# Patient Record
Sex: Female | Born: 1935 | Race: White | Hispanic: No | Smoking: Never smoker
Health system: Southern US, Community
[De-identification: ages and names within clinical notes are randomized; demographics above are authoritative.]

## PROBLEM LIST (undated history)

## (undated) DIAGNOSIS — R600 Localized edema: Secondary | ICD-10-CM

## (undated) DIAGNOSIS — K219 Gastro-esophageal reflux disease without esophagitis: Secondary | ICD-10-CM

## (undated) DIAGNOSIS — R609 Edema, unspecified: Secondary | ICD-10-CM

## (undated) DIAGNOSIS — C801 Malignant (primary) neoplasm, unspecified: Secondary | ICD-10-CM

## (undated) DIAGNOSIS — N390 Urinary tract infection, site not specified: Secondary | ICD-10-CM

## (undated) DIAGNOSIS — M1990 Unspecified osteoarthritis, unspecified site: Secondary | ICD-10-CM

## (undated) DIAGNOSIS — C569 Malignant neoplasm of unspecified ovary: Secondary | ICD-10-CM

## (undated) DIAGNOSIS — I509 Heart failure, unspecified: Secondary | ICD-10-CM

## (undated) DIAGNOSIS — Z66 Do not resuscitate: Secondary | ICD-10-CM

## (undated) HISTORY — DX: Do not resuscitate: Z66

## (undated) HISTORY — PX: CARDIAC CATHETERIZATION: SHX172

## (undated) HISTORY — PX: CHOLECYSTECTOMY: SHX55

## (undated) HISTORY — DX: Urinary tract infection, site not specified: N39.0

## (undated) HISTORY — PX: TOTAL KNEE ARTHROPLASTY: SHX125

## (undated) HISTORY — PX: APPENDECTOMY: SHX54

## (undated) HISTORY — PX: TOTAL ABDOMINAL HYSTERECTOMY: SHX209

---

## 2001-10-23 ENCOUNTER — Ambulatory Visit (HOSPITAL_COMMUNITY): Admission: RE | Admit: 2001-10-23 | Discharge: 2001-10-23 | Payer: Self-pay | Admitting: Pulmonary Disease

## 2001-12-17 ENCOUNTER — Ambulatory Visit (HOSPITAL_COMMUNITY): Admission: RE | Admit: 2001-12-17 | Discharge: 2001-12-17 | Payer: Self-pay | Admitting: Pulmonary Disease

## 2002-09-09 ENCOUNTER — Ambulatory Visit (HOSPITAL_COMMUNITY): Admission: RE | Admit: 2002-09-09 | Discharge: 2002-09-09 | Payer: Self-pay | Admitting: Pulmonary Disease

## 2002-11-29 ENCOUNTER — Encounter (INDEPENDENT_AMBULATORY_CARE_PROVIDER_SITE_OTHER): Payer: Self-pay | Admitting: Internal Medicine

## 2002-11-29 ENCOUNTER — Ambulatory Visit (HOSPITAL_COMMUNITY): Admission: RE | Admit: 2002-11-29 | Discharge: 2002-11-29 | Payer: Self-pay | Admitting: Internal Medicine

## 2003-05-18 ENCOUNTER — Ambulatory Visit (HOSPITAL_COMMUNITY): Admission: RE | Admit: 2003-05-18 | Discharge: 2003-05-18 | Payer: Self-pay | Admitting: Internal Medicine

## 2003-08-22 ENCOUNTER — Ambulatory Visit (HOSPITAL_COMMUNITY): Admission: RE | Admit: 2003-08-22 | Discharge: 2003-08-22 | Payer: Self-pay | Admitting: Internal Medicine

## 2003-08-22 ENCOUNTER — Encounter (INDEPENDENT_AMBULATORY_CARE_PROVIDER_SITE_OTHER): Payer: Self-pay | Admitting: Internal Medicine

## 2004-04-03 ENCOUNTER — Ambulatory Visit (HOSPITAL_COMMUNITY): Admission: RE | Admit: 2004-04-03 | Discharge: 2004-04-03 | Payer: Self-pay | Admitting: Pulmonary Disease

## 2004-04-06 ENCOUNTER — Encounter: Admission: RE | Admit: 2004-04-06 | Discharge: 2004-04-06 | Payer: Self-pay | Admitting: Pulmonary Disease

## 2004-04-25 ENCOUNTER — Ambulatory Visit (HOSPITAL_COMMUNITY): Admission: RE | Admit: 2004-04-25 | Discharge: 2004-04-25 | Payer: Self-pay | Admitting: Pulmonary Disease

## 2004-10-12 ENCOUNTER — Encounter: Payer: Self-pay | Admitting: Orthopedic Surgery

## 2004-10-15 ENCOUNTER — Ambulatory Visit: Payer: Self-pay | Admitting: Orthopedic Surgery

## 2004-10-24 ENCOUNTER — Ambulatory Visit (HOSPITAL_COMMUNITY): Admission: RE | Admit: 2004-10-24 | Discharge: 2004-10-24 | Payer: Self-pay | Admitting: Orthopedic Surgery

## 2004-10-29 ENCOUNTER — Ambulatory Visit: Payer: Self-pay | Admitting: Orthopedic Surgery

## 2004-11-22 ENCOUNTER — Ambulatory Visit: Payer: Self-pay | Admitting: Orthopedic Surgery

## 2004-12-04 ENCOUNTER — Ambulatory Visit: Payer: Self-pay | Admitting: Orthopedic Surgery

## 2004-12-04 ENCOUNTER — Inpatient Hospital Stay (HOSPITAL_COMMUNITY): Admission: RE | Admit: 2004-12-04 | Discharge: 2004-12-07 | Payer: Self-pay | Admitting: Orthopedic Surgery

## 2004-12-07 ENCOUNTER — Inpatient Hospital Stay: Admission: AD | Admit: 2004-12-07 | Discharge: 2004-12-26 | Payer: Self-pay | Admitting: Pulmonary Disease

## 2005-01-10 ENCOUNTER — Ambulatory Visit: Payer: Self-pay | Admitting: Orthopedic Surgery

## 2005-03-20 ENCOUNTER — Ambulatory Visit: Payer: Self-pay | Admitting: Orthopedic Surgery

## 2005-06-17 ENCOUNTER — Encounter: Payer: Self-pay | Admitting: Orthopedic Surgery

## 2005-06-19 ENCOUNTER — Ambulatory Visit: Payer: Self-pay | Admitting: Orthopedic Surgery

## 2007-05-11 ENCOUNTER — Ambulatory Visit (HOSPITAL_COMMUNITY): Admission: RE | Admit: 2007-05-11 | Discharge: 2007-05-11 | Payer: Self-pay | Admitting: Pulmonary Disease

## 2007-09-07 ENCOUNTER — Ambulatory Visit (HOSPITAL_COMMUNITY): Admission: RE | Admit: 2007-09-07 | Discharge: 2007-09-07 | Payer: Self-pay | Admitting: Pulmonary Disease

## 2007-09-07 ENCOUNTER — Encounter: Payer: Self-pay | Admitting: Orthopedic Surgery

## 2007-11-19 ENCOUNTER — Ambulatory Visit: Payer: Self-pay | Admitting: Orthopedic Surgery

## 2007-11-19 DIAGNOSIS — M1710 Unilateral primary osteoarthritis, unspecified knee: Secondary | ICD-10-CM | POA: Insufficient documentation

## 2007-11-19 DIAGNOSIS — M76899 Other specified enthesopathies of unspecified lower limb, excluding foot: Secondary | ICD-10-CM | POA: Insufficient documentation

## 2007-11-19 DIAGNOSIS — M25569 Pain in unspecified knee: Secondary | ICD-10-CM

## 2008-01-11 ENCOUNTER — Ambulatory Visit (HOSPITAL_COMMUNITY): Admission: RE | Admit: 2008-01-11 | Discharge: 2008-01-11 | Payer: Self-pay | Admitting: Pulmonary Disease

## 2009-10-11 ENCOUNTER — Ambulatory Visit: Payer: Self-pay | Admitting: Internal Medicine

## 2009-10-11 DIAGNOSIS — K921 Melena: Secondary | ICD-10-CM | POA: Insufficient documentation

## 2009-10-11 DIAGNOSIS — K5909 Other constipation: Secondary | ICD-10-CM | POA: Insufficient documentation

## 2009-10-11 DIAGNOSIS — K219 Gastro-esophageal reflux disease without esophagitis: Secondary | ICD-10-CM

## 2009-10-11 DIAGNOSIS — R1310 Dysphagia, unspecified: Secondary | ICD-10-CM

## 2009-10-12 ENCOUNTER — Encounter: Payer: Self-pay | Admitting: Internal Medicine

## 2009-10-13 ENCOUNTER — Encounter: Payer: Self-pay | Admitting: Internal Medicine

## 2009-10-20 ENCOUNTER — Ambulatory Visit (HOSPITAL_COMMUNITY): Admission: RE | Admit: 2009-10-20 | Discharge: 2009-10-20 | Payer: Self-pay | Admitting: Internal Medicine

## 2009-10-20 ENCOUNTER — Ambulatory Visit: Payer: Self-pay | Admitting: Internal Medicine

## 2009-10-31 ENCOUNTER — Encounter: Payer: Self-pay | Admitting: Internal Medicine

## 2010-01-22 ENCOUNTER — Ambulatory Visit: Admission: AD | Admit: 2010-01-22 | Discharge: 2010-01-22 | Payer: Self-pay | Admitting: Pulmonary Disease

## 2010-05-07 ENCOUNTER — Ambulatory Visit (HOSPITAL_COMMUNITY): Admission: RE | Admit: 2010-05-07 | Discharge: 2010-05-07 | Payer: Self-pay | Admitting: Pulmonary Disease

## 2011-04-19 NOTE — Op Note (Signed)
NAMEMARGUARITE, Duke NO.:  [id number]   MEDICAL RECORD NO.:  [id number]          PATIENT TYPE:  AMB   LOCATION:  DAY                           FACILITY:  [facility]   PHYSICIAN:  Vickki Hearing, M.D.DATE OF BIRTH:  20-May-1935   DATE OF PROCEDURE:  12/04/2004  DATE OF DISCHARGE:                                 OPERATIVE REPORT   PREOPERATIVE DIAGNOSIS:  Osteoarthritis of left knee.   POSTOPERATIVE DIAGNOSIS:  Osteoarthritis of left knee.   PROCEDURES:  Left total knee replacement.   IMPLANTS USED:  Stryker Scorpio posterior stabilized knee, 9 femur, 7 tibia,  10 flex polyethylene insert, #5 universal dome patella.   SURGEON:  Vickki Hearing, M.D.   ASSISTANTVictorino Dike __________   SPECIMENS:  Bone from the knee resections.   ESTIMATED BLOOD LOSS:  Minimal.   COMPLICATIONS:  None.   COUNTS:  Correct.   TOURNIQUET TIME:  One hour and 21 minutes.   INDICATIONS FOR PROCEDURE:  Pain.   ANESTHETIC:  Spinal.   OPERATIVE FINDINGS:  1.  Degenerative arthritis of the posteromedial surface of the tibia.  2.  Torn medial meniscus.  3.  Ganglion cyst of the anterior cruciate ligament.   DESCRIPTION OF PROCEDURE:  The patient was identified in the preoperative  holding area.  The surgical site was signed by the patient and the physician  as the left knee.  Preoperative antibiotic was given, Ancef.  That was well  tolerated despite penicillin allergy.  She was taken to surgery.  A spinal  anesthetic was placed.  She was placed supine.  A tourniquet was placed on  the left knee.  The left knee was prepped and draped in sterile technique.  The Foley catheter had been inserted prior to prepping the knee.  A timeout  was taken as required and the surgical site was confirmed as required.  Antibiotics were in.  Within an hour of skin incision, the implants were in.   An incision was made after exsanguination of the limb and elevation of the  tourniquet over  the left knee centered over the patella.  The subcutaneous  tissue was divided.  A medial arthrotomy was performed.  The patella was  everted.  Soft tissue, including medial and lateral menisci, ACL and PCL,  were resected.  The tibial guide was set at neutral with a 0 degree slope  and set for 8 mm resection from the medial tibial side.  Resection was made  with an oscillating saw.   The femur was prepared with a 3/8 inch drill bit drilled into the femoral  canal.  The canal was decompressed with a fluted guide rod, suction and  irrigation.  The distal femoral cut was set for 10 mm.  A 10 mm bone  resection was performed the femur was sized to a size 9 with mild external  rotation and four distal femoral cuts were made.  The patella was prepared.  Measured 22 and was cut down to a 13.  A universal #8 patellar dome trial  was prepared using three  peg holes and the #5 lollipop.   A trial reduction was performed with trial components and blocks were used  to confirm flexion and extension gaps.   Lateral release was done due to mild subluxation of the patella.   The knee was irrigated and the tibial punch was made.  The tibia, femur and  patella were cemented in place and cement cured.  A 10 mm flex insert was  placed.  Final range of motion was 0-120 using a no touch technique.  The  patella tracked normally.  An interrupted Bralon suture was used to close  the extensor mechanism.  Marcaine 30 mL was injected into the joint.  Pain  pump catheter was placed in the joint.  The subcutaneous tissue was closed  with a Hemovac drain in the subcutaneous tissue.  We used 2-0 absorbable  suture for that.   Staples were used to close the skin and sterile dressings and CryoCuff as  well.   Ace bandage was used to wrap the limb.  The patient was taken to the  recovery room in stable condition.     [name]   SEH/MEDQ  D:  12/04/2004  T:  12/04/2004  Job:  [id number]

## 2011-04-19 NOTE — H&P (Signed)
Sarah, Duke NO.:  [id number]   MEDICAL RECORD NO.:  [id number]          PATIENT TYPE:  AMB   LOCATION:  DAY                           FACILITY:  [facility]   PHYSICIAN:  Vickki Hearing, M.D.DATE OF BIRTH:  [birth year]   DATE OF ADMISSION:  DATE OF DISCHARGE:  LH                                HISTORY & PHYSICAL   CHIEF COMPLAINT:  Left knee pain.   HISTORY:  This is a 76 year old female with left knee pain and swelling,  presented with catching, aching sensation in the left knee which was  diffuse, posterior, somewhat radiating into the calf with a feeling of  giving way. She had a MRI of the knee which showed thinning of the articular  cartilage on the patella on the patellofemoral side with a tear of the  posterior horn of the medial meniscus and partial tearing of her ACL with  possible medial collateral ligament sprain, but this mot likely was  degenerative. There was degenerative osteoarthritic changes throughout the  knee. We offered her a small arthroscopic surgery versus large total knee  replacement, and she has opted for the knee replacement to get it all done  at one time. Her x-rays show degenerative changes though not as severe as  the MRI would indicate.   REVIEW OF SYSTEMS:  The patient reports the following:  Weight gain, chest  pain in the past, constipation, reflux, joint pain, osteoporosis, sinusitis,  seasonal allergies, sinus problems. Otherwise, the other four systems are  normal.   ALLERGIES:  She is allergic to PENICILLIN; however, Ancef should be okay.   PAST MEDICAL HISTORY:  Past history of medical problems of mitral valve  prolapse, protruding disk in the lumbar spine. She has had a bladder  tacking, appendectomy, hysterectomy, hemorrhoidectomy __________.   CURRENT MEDICATIONS:  Vicodin.   FAMILY HISTORY:  Heart disease, arthritis.   FAMILY PHYSICIAN:  Dr. Juanetta Gosling.   SOCIAL HISTORY:  She is widowed. Does not smoke or  drink. Caffeine use, yes.  Grades completed, 12.   PHYSICAL EXAMINATION:  Weight 185, pulse 66, respiratory rate 16. Appearance  normal. She has normal pulses. There is no tenderness noted in the lower  extremities. There are multiple varicose veins. Temperature normal. Pulses  normal without edema. Lymph nodes, neck normal. Gait and station were  reasonably normal with a valgus hint to the knee.   Her left knee did have 0 to 125 degree range of motion. There was some pain  noted as well along the joint lines. The knee was stable. There was some  subtle signs of meniscal pathology. Her muscle strength and tone were  normal, and her hint of valgus was mild. She had no major crepitance. There  was no mass or effusion.   Her other extremities showed normal range of motion, strength, and  stability. She did have a tender nodule with Tommi Rumps Quervain's syndrome of the  right wrist for which she was injected on December 22.   She is wearing a splint for that.   IMPRESSION:  Osteoarthritis of the  knee. Recommend total knee replacement of  the left knee. The patient has come in for a preoperative evaluation on  December 22 at which time we discussed the risks and benefits of this  procedure; specific to this procedure include infection, excessive bleeding,  need for transfusion, anesthetic death, blood clot, pulmonary embolism and  death from that, infection with removal of prosthesis, and possible  reimplantation versus amputation versus fusion.   The patient agrees to go ahead and proceed with said procedure.     [name]   SEH/MEDQ  D:  11/22/2004  T:  11/22/2004  Job:  [id number]

## 2011-04-19 NOTE — Group Therapy Note (Signed)
[name], [name] NO.:  [id number]   MEDICAL RECORD NO.:  [id number]          PATIENT TYPE:  ORB   LOCATION:  S118                          FACILITY:  [facility]   PHYSICIAN:  [name], M.D.DATE OF BIRTH:  1935-04-12   DATE OF PROCEDURE:  DATE OF DISCHARGE:                                   PROGRESS NOTE   [name] seems to be doing well, and plans are being made for her to go  home, probably tomorrow.  She has had a left knee replacement.  In addition  to that she has chronic low back pain, has had significant problems with her  chronic sinusitis, but otherwise has been in fairly good health in general.  Socially, she does not smoke, she is a widow.  She stays with shut-ins, and  her family is concerned about that because of the possibility that she could  hurt herself.  Her exam now shows that her chest is very clear.  She is  awake and alert.  Her vitals are as recorded.  Her pupils are reactive.  Her  nose and throat are clear.  She does not have any sinus congestion.  The  knee scar looks as if it is healing.  She is undergoing physical therapy,  and doing very well with that.   ASSESSMENT:  I think that she should be fine to go home.  We need to follow  her prothrombin time carefully there.      ELH/MEDQ  D:  12/25/2004  T:  12/25/2004  Job:  [id number]

## 2011-04-19 NOTE — Op Note (Signed)
NAME:  [name], [name]                            ACCOUNT NO.:  [id number]   MEDICAL RECORD NO.:  [id number]                   PATIENT TYPE:  AMB   LOCATION:  DAY                                  FACILITY:  [facility]   PHYSICIAN:  Lionel December, M.D.                 DATE OF BIRTH:  July 08, 1935   DATE OF PROCEDURE:  05/18/2003  DATE OF DISCHARGE:                                 OPERATIVE REPORT   PROCEDURE:  Esophagogastroduodenoscopy with esophageal dilatation.   ENDOSCOPIST:  Lionel December, M.D.   INDICATIONS:  This patient is a 76 year old Caucasian female with chronic  GERD whose symptoms are fairly well controlled with antireflux measures,  Prilosec and low dose domperidone.  She has intermittent solid food  dysphagia. She could have a ring or a stricture.  She is undergoing EGD and  possible ED.  The procedure and risks were reviewed with the patient and  informed consent was obtained.   PREOPERATIVE MEDICATIONS:  Cetacaine spray for oropharyngeal topical  anesthesia, Demerol 50 mg IV and Versed 6mg  IV.   FINDINGS:  Procedure performed in endoscopy suite.  The patient's vital  signs and O2 saturation were monitored during the procedure and remained  stable.  The patient was placed in the left lateral recumbent position and  Olympus videoscope was passed via the oropharynx into the esophagus.   ESOPHAGUS:  Mucosa of the esophagus was normal except distal 2 cm where she  had some erosions and 2 small ulcers.  One ulcer was diamond shaped  extending into the GE junction.  The distal end of this ulcer was edematous.  No obvious ring or stricture was noted.   STOMACH:  It was empty and distended very well with insufflation.  The folds  of the proximal stomach were normal.  Examination of the mucosa revealed  linear erythema and a single antral erosion.  Pyloric channel was patent.  Angularis, fundus, and cardia were examined by retroflexing the scope and  were normal.   DUODENUM:   Examination of the bulb and postbulbar duodenum was normal.  Endoscope was withdrawn.   Esophagus was dilated by passing 54 French Maloney dilator through the  esophagus completely.  Examination of the esophagus post ED revealed a very  tiny mucosal disruption at the GE junction.  The endoscope was withdrawn.  The patient tolerated the procedure well.   FINAL DIAGNOSES:  1. Ulcerative/erosive reflux esophagitis. Ulcers and erosions noted in the     distal 2 cm of the esophagus.  2. Small sliding hiatal hernia. No obvious ring or stricture noted, but     esophagus dilated by passing 54 [place] Maloney dilator.  3. Erosive antral gastritis.   RECOMMENDATIONS:  1. Antireflux measures reenforced.  2. Will increase her Prilosec to 20 mg p.o. b.i.d.  3. She will continue domperidone until she runs out of the prescription; and  I am afraid she, at least for the time being, will not be able to get     this refilled.  4. She will return for OV in 3 months from now.                                               Lionel December, M.D.    NR/MEDQ  D:  05/18/2003  T:  05/18/2003  Job:  [id number]   cc:   Ramon Dredge L. Juanetta Gosling, M.D.  [address]  [place]  [place] [address]  Fax: [phone]

## 2011-04-19 NOTE — Discharge Summary (Signed)
NAMEADELFA, [name] NO.:  [id number]   MEDICAL RECORD NO.:  [id number]          PATIENT TYPE:  INP   LOCATION:  A332                          FACILITY:  [facility]   PHYSICIAN:  Vickki Hearing, M.D.DATE OF BIRTH:  December 14, 1934   DATE OF ADMISSION:  12/04/2004  DATE OF DISCHARGE:  01/06/2006LH                                 DISCHARGE SUMMARY   ADMISSION DIAGNOSIS:  Osteoarthritis, left  knee.   DISCHARGE DIAGNOSIS:  Osteoarthritis, left  knee.   ADMITTING AND DISCHARGE PHYSICIAN:  Vickki Hearing, M.D.   FAMILY PHYSICIAN AND CONSULTING PHYSICIAN:  Edward L. Juanetta Gosling, M.D.   HISTORY:  The patient is a 76 year old female with left  knee pain and  swelling, catching, and aching sensation in the left  knee.  MRI showed  thinning of the articular cartilage at the patella, tear of the posterior  horn of the medial meniscus, partial tear of the ACL with degenerative  changes throughout the knee.  We offered her a small arthroscopic surgery  versus large total knee replacement.  She opted for the latter.  She was  brought in because of unremitting pain.   HOSPITAL COURSE:  On December 04, 2004, this patient underwent an  uncomplicated left total knee replacement with a Stryker Scorpio posterior  stabilized knee.  We used a 9 femur, 7 tibia, 10 flexed polyene insert with  a #5 universal domed patella with a lateral released.  It was done under  spinal technique.  Operative findings were degenerative arthritis of  posterior medial surface of the tibia, torn medial meniscus, ganglion cyst  of anterior collateral ligament.   After surgery, the patient came to the floor and did well.  On a PCA pump,  the pain was well controlled.  She ambulated 50 feet with physical therapy  and had a range of motion of 0 to 78 degrees.  Her CPM had been advanced up  to 60 degrees.   Her hemoglobin was 8.9.  She was on iron.  Her Chem-7 was stable.   She was afebrile.  Her wound  looked great.  She had no leg edema. She was  neurovascularly intact, awake, and alert.   DISCHARGE DISPOSITION:  To the [facility].  Condition is stable and  improved in terms of overall knee function.   DISCHARGE MEDICATIONS:  1.  Enoxaparin 30 mg q.12 h. for 25 days.  2.  Skelaxin 800 mg q.8 h. as needed.  3.  Vicodin one q.4 h. as needed.  4.  Colace 100 mg q.12 h.  5.  Feosol one p.o. b.i.d.   PHYSICAL THERAPY INSTRUCTIONS:  Advance range of motion as tolerated.  Advance gait as tolerated.  Use CPM machine.  Advance 10 degrees per day  starting at 70 degrees.   FOLLOWUP:  Dr. Romeo Apple in 1 week after discharge from the [facility].   When the patient goes home, set up home physical therapy 3 times a week with  the same instructions as given above.     [name]   SEH/MEDQ  D:  12/07/2004  T:  12/07/2004  Job:  [id number]

## 2012-11-05 ENCOUNTER — Ambulatory Visit (HOSPITAL_COMMUNITY)
Admission: RE | Admit: 2012-11-05 | Discharge: 2012-11-05 | Disposition: A | Discharge status: Home / Self Care | Payer: Medicare Other | Source: Ambulatory Visit | Attending: Pulmonary Disease | Admitting: Pulmonary Disease

## 2012-11-05 ENCOUNTER — Other Ambulatory Visit (HOSPITAL_COMMUNITY): Payer: Self-pay | Admitting: Pulmonary Disease

## 2012-11-05 DIAGNOSIS — J4489 Other specified chronic obstructive pulmonary disease: Secondary | ICD-10-CM | POA: Insufficient documentation

## 2012-11-05 DIAGNOSIS — J9819 Other pulmonary collapse: Secondary | ICD-10-CM | POA: Insufficient documentation

## 2012-11-05 DIAGNOSIS — R0781 Pleurodynia: Secondary | ICD-10-CM

## 2012-11-05 DIAGNOSIS — R079 Chest pain, unspecified: Secondary | ICD-10-CM | POA: Insufficient documentation

## 2012-11-05 DIAGNOSIS — R0602 Shortness of breath: Secondary | ICD-10-CM | POA: Insufficient documentation

## 2012-11-05 DIAGNOSIS — R05 Cough: Secondary | ICD-10-CM | POA: Insufficient documentation

## 2012-11-05 DIAGNOSIS — R059 Cough, unspecified: Secondary | ICD-10-CM | POA: Insufficient documentation

## 2012-11-05 DIAGNOSIS — J449 Chronic obstructive pulmonary disease, unspecified: Secondary | ICD-10-CM | POA: Insufficient documentation

## 2013-11-04 ENCOUNTER — Other Ambulatory Visit (HOSPITAL_COMMUNITY): Payer: Self-pay | Admitting: Pulmonary Disease

## 2013-11-04 DIAGNOSIS — Z139 Encounter for screening, unspecified: Secondary | ICD-10-CM

## 2013-11-09 ENCOUNTER — Ambulatory Visit (HOSPITAL_COMMUNITY)
Admission: RE | Admit: 2013-11-09 | Discharge: 2013-11-09 | Disposition: A | Discharge status: Home / Self Care | Payer: Medicare Other | Source: Ambulatory Visit | Attending: Pulmonary Disease | Admitting: Pulmonary Disease

## 2013-11-09 DIAGNOSIS — Z139 Encounter for screening, unspecified: Secondary | ICD-10-CM

## 2013-11-09 DIAGNOSIS — Z1231 Encounter for screening mammogram for malignant neoplasm of breast: Secondary | ICD-10-CM | POA: Insufficient documentation

## 2013-12-28 ENCOUNTER — Ambulatory Visit (HOSPITAL_COMMUNITY)
Admission: RE | Admit: 2013-12-28 | Discharge: 2013-12-28 | Disposition: A | Discharge status: Home / Self Care | Payer: Medicare HMO | Source: Ambulatory Visit | Attending: Pulmonary Disease | Admitting: Pulmonary Disease

## 2013-12-28 ENCOUNTER — Other Ambulatory Visit (HOSPITAL_COMMUNITY): Payer: Self-pay | Admitting: Pulmonary Disease

## 2013-12-28 DIAGNOSIS — M549 Dorsalgia, unspecified: Secondary | ICD-10-CM

## 2013-12-28 DIAGNOSIS — M51379 Other intervertebral disc degeneration, lumbosacral region without mention of lumbar back pain or lower extremity pain: Secondary | ICD-10-CM | POA: Insufficient documentation

## 2013-12-28 DIAGNOSIS — M545 Low back pain, unspecified: Secondary | ICD-10-CM | POA: Insufficient documentation

## 2013-12-28 DIAGNOSIS — M5137 Other intervertebral disc degeneration, lumbosacral region: Secondary | ICD-10-CM | POA: Insufficient documentation

## 2014-06-15 ENCOUNTER — Other Ambulatory Visit (HOSPITAL_COMMUNITY): Payer: Self-pay | Admitting: Pulmonary Disease

## 2014-06-15 ENCOUNTER — Ambulatory Visit (HOSPITAL_COMMUNITY)
Admission: RE | Admit: 2014-06-15 | Discharge: 2014-06-15 | Disposition: A | Discharge status: Home / Self Care | Payer: Medicare HMO | Source: Ambulatory Visit | Attending: Pulmonary Disease | Admitting: Pulmonary Disease

## 2014-06-15 DIAGNOSIS — M79605 Pain in left leg: Secondary | ICD-10-CM

## 2014-06-15 DIAGNOSIS — M7989 Other specified soft tissue disorders: Secondary | ICD-10-CM

## 2014-06-15 DIAGNOSIS — R609 Edema, unspecified: Secondary | ICD-10-CM | POA: Insufficient documentation

## 2014-06-15 DIAGNOSIS — M79609 Pain in unspecified limb: Secondary | ICD-10-CM | POA: Insufficient documentation

## 2014-07-01 ENCOUNTER — Encounter: Payer: Self-pay | Admitting: Gastroenterology

## 2014-07-27 ENCOUNTER — Encounter (HOSPITAL_COMMUNITY): Payer: Self-pay | Admitting: Emergency Medicine

## 2014-07-27 ENCOUNTER — Emergency Department (HOSPITAL_COMMUNITY): Payer: Medicare HMO

## 2014-07-27 ENCOUNTER — Inpatient Hospital Stay (HOSPITAL_COMMUNITY)
Admission: EM | Admit: 2014-07-27 | Discharge: 2014-08-05 | DRG: 180 | Disposition: A | Discharge status: Skilled Nursing Facility | Payer: Medicare HMO | Attending: Internal Medicine | Admitting: Internal Medicine

## 2014-07-27 DIAGNOSIS — Z96659 Presence of unspecified artificial knee joint: Secondary | ICD-10-CM | POA: Diagnosis not present

## 2014-07-27 DIAGNOSIS — D630 Anemia in neoplastic disease: Secondary | ICD-10-CM | POA: Diagnosis present

## 2014-07-27 DIAGNOSIS — Z6825 Body mass index (BMI) 25.0-25.9, adult: Secondary | ICD-10-CM

## 2014-07-27 DIAGNOSIS — I498 Other specified cardiac arrhythmias: Secondary | ICD-10-CM | POA: Diagnosis not present

## 2014-07-27 DIAGNOSIS — E875 Hyperkalemia: Secondary | ICD-10-CM

## 2014-07-27 DIAGNOSIS — Z880 Allergy status to penicillin: Secondary | ICD-10-CM

## 2014-07-27 DIAGNOSIS — Z66 Do not resuscitate: Secondary | ICD-10-CM | POA: Diagnosis present

## 2014-07-27 DIAGNOSIS — R609 Edema, unspecified: Secondary | ICD-10-CM | POA: Diagnosis present

## 2014-07-27 DIAGNOSIS — Z9089 Acquired absence of other organs: Secondary | ICD-10-CM | POA: Diagnosis not present

## 2014-07-27 DIAGNOSIS — C801 Malignant (primary) neoplasm, unspecified: Secondary | ICD-10-CM

## 2014-07-27 DIAGNOSIS — E41 Nutritional marasmus: Secondary | ICD-10-CM | POA: Diagnosis present

## 2014-07-27 DIAGNOSIS — I059 Rheumatic mitral valve disease, unspecified: Secondary | ICD-10-CM | POA: Diagnosis present

## 2014-07-27 DIAGNOSIS — I509 Heart failure, unspecified: Secondary | ICD-10-CM | POA: Diagnosis present

## 2014-07-27 DIAGNOSIS — R188 Other ascites: Secondary | ICD-10-CM | POA: Diagnosis present

## 2014-07-27 DIAGNOSIS — J90 Pleural effusion, not elsewhere classified: Secondary | ICD-10-CM

## 2014-07-27 DIAGNOSIS — I5033 Acute on chronic diastolic (congestive) heart failure: Secondary | ICD-10-CM | POA: Diagnosis present

## 2014-07-27 DIAGNOSIS — E871 Hypo-osmolality and hyponatremia: Secondary | ICD-10-CM | POA: Diagnosis not present

## 2014-07-27 DIAGNOSIS — K219 Gastro-esophageal reflux disease without esophagitis: Secondary | ICD-10-CM | POA: Diagnosis present

## 2014-07-27 DIAGNOSIS — D500 Iron deficiency anemia secondary to blood loss (chronic): Secondary | ICD-10-CM | POA: Diagnosis present

## 2014-07-27 DIAGNOSIS — K921 Melena: Secondary | ICD-10-CM

## 2014-07-27 DIAGNOSIS — C569 Malignant neoplasm of unspecified ovary: Secondary | ICD-10-CM | POA: Diagnosis not present

## 2014-07-27 DIAGNOSIS — Z803 Family history of malignant neoplasm of breast: Secondary | ICD-10-CM | POA: Diagnosis not present

## 2014-07-27 DIAGNOSIS — I959 Hypotension, unspecified: Secondary | ICD-10-CM | POA: Diagnosis not present

## 2014-07-27 DIAGNOSIS — J910 Malignant pleural effusion: Principal | ICD-10-CM

## 2014-07-27 DIAGNOSIS — M1990 Unspecified osteoarthritis, unspecified site: Secondary | ICD-10-CM | POA: Diagnosis present

## 2014-07-27 DIAGNOSIS — K589 Irritable bowel syndrome without diarrhea: Secondary | ICD-10-CM | POA: Diagnosis present

## 2014-07-27 DIAGNOSIS — C482 Malignant neoplasm of peritoneum, unspecified: Secondary | ICD-10-CM | POA: Diagnosis present

## 2014-07-27 DIAGNOSIS — J189 Pneumonia, unspecified organism: Secondary | ICD-10-CM | POA: Diagnosis present

## 2014-07-27 DIAGNOSIS — D649 Anemia, unspecified: Secondary | ICD-10-CM

## 2014-07-27 DIAGNOSIS — R600 Localized edema: Secondary | ICD-10-CM

## 2014-07-27 DIAGNOSIS — I5031 Acute diastolic (congestive) heart failure: Secondary | ICD-10-CM | POA: Diagnosis present

## 2014-07-27 DIAGNOSIS — R0602 Shortness of breath: Secondary | ICD-10-CM | POA: Diagnosis present

## 2014-07-27 HISTORY — DX: Edema, unspecified: R60.9

## 2014-07-27 HISTORY — DX: Heart failure, unspecified: I50.9

## 2014-07-27 HISTORY — DX: Localized edema: R60.0

## 2014-07-27 HISTORY — DX: Gastro-esophageal reflux disease without esophagitis: K21.9

## 2014-07-27 HISTORY — DX: Unspecified osteoarthritis, unspecified site: M19.90

## 2014-07-27 LAB — CBC
HEMATOCRIT: 34.6 % — AB (ref 36.0–46.0)
HEMOGLOBIN: 11.1 g/dL — AB (ref 12.0–15.0)
MCH: 25.7 pg — AB (ref 26.0–34.0)
MCHC: 32.1 g/dL (ref 30.0–36.0)
MCV: 80.1 fL (ref 78.0–100.0)
Platelets: 401 10*3/uL — ABNORMAL HIGH (ref 150–400)
RBC: 4.32 MIL/uL (ref 3.87–5.11)
RDW: 17.2 % — ABNORMAL HIGH (ref 11.5–15.5)
WBC: 6.9 10*3/uL (ref 4.0–10.5)

## 2014-07-27 LAB — TROPONIN I: Troponin I: <0.3 ng/mL (ref <0.30)

## 2014-07-27 LAB — BASIC METABOLIC PANEL
Anion gap: 12 (ref 5–15)
BUN: 22 mg/dL (ref 6–23)
CALCIUM: 7.8 mg/dL — AB (ref 8.4–10.5)
CO2: 29 mEq/L (ref 19–32)
Chloride: 94 mEq/L — ABNORMAL LOW (ref 96–112)
Creatinine, Ser: 0.81 mg/dL (ref 0.50–1.10)
GFR calc Af Amer: 79 mL/min — ABNORMAL LOW (ref >90)
GFR, EST NON AFRICAN AMERICAN: 68 mL/min — AB (ref >90)
GLUCOSE: 115 mg/dL — AB (ref 70–99)
Potassium: 3.6 mEq/L — ABNORMAL LOW (ref 3.7–5.3)
Sodium: 135 mEq/L — ABNORMAL LOW (ref 137–147)

## 2014-07-27 LAB — LACTATE DEHYDROGENASE: LDH: 219 U/L (ref 94–250)

## 2014-07-27 LAB — PRO B NATRIURETIC PEPTIDE: Pro B Natriuretic peptide (BNP): 456.7 pg/mL — ABNORMAL HIGH (ref 0–450)

## 2014-07-27 LAB — RETICULOCYTES
RBC.: 4.29 MIL/uL (ref 3.87–5.11)
RETIC COUNT ABSOLUTE: 72.9 10*3/uL (ref 19.0–186.0)
Retic Ct Pct: 1.7 % (ref 0.4–3.1)

## 2014-07-27 MED ORDER — ONDANSETRON HCL 4 MG PO TABS
4.0000 mg | ORAL_TABLET | Freq: Four times a day (QID) | ORAL | Status: DC | PRN
  Administered 2014-07-28: 4 mg via ORAL
  Filled 2014-07-27: qty 1

## 2014-07-27 MED ORDER — SODIUM CHLORIDE 0.9 % IJ SOLN
3.0000 mL | Freq: Two times a day (BID) | INTRAMUSCULAR | Status: DC
  Administered 2014-07-27 – 2014-08-05 (×15): 3 mL via INTRAVENOUS

## 2014-07-27 MED ORDER — ENOXAPARIN SODIUM 40 MG/0.4ML ~~LOC~~ SOLN
40.0000 mg | SUBCUTANEOUS | Status: DC
  Administered 2014-07-27 – 2014-08-04 (×9): 40 mg via SUBCUTANEOUS
  Filled 2014-07-27 (×12): qty 0.4

## 2014-07-27 MED ORDER — ALPRAZOLAM 0.5 MG PO TABS
0.5000 mg | ORAL_TABLET | Freq: Every evening | ORAL | Status: DC | PRN
  Administered 2014-07-27 – 2014-08-02 (×4): 0.5 mg via ORAL
  Filled 2014-07-27 (×4): qty 1

## 2014-07-27 MED ORDER — HYDROCODONE-ACETAMINOPHEN 5-325 MG PO TABS
1.0000 | ORAL_TABLET | ORAL | Status: DC | PRN
  Administered 2014-07-27 – 2014-07-28 (×4): 2 via ORAL
  Administered 2014-07-29: 1 via ORAL
  Administered 2014-07-29 (×3): 2 via ORAL
  Administered 2014-07-30: 1 via ORAL
  Administered 2014-07-30 – 2014-08-01 (×6): 2 via ORAL
  Administered 2014-08-02 (×3): 1 via ORAL
  Administered 2014-08-03: 2 via ORAL
  Administered 2014-08-03: 1 via ORAL
  Filled 2014-07-27 (×2): qty 2
  Filled 2014-07-27 (×3): qty 1
  Filled 2014-07-27 (×3): qty 2
  Filled 2014-07-27 (×4): qty 1
  Filled 2014-07-27: qty 2
  Filled 2014-07-27: qty 1
  Filled 2014-07-27: qty 2
  Filled 2014-07-27: qty 1
  Filled 2014-07-27 (×5): qty 2
  Filled 2014-07-27: qty 1

## 2014-07-27 MED ORDER — ONDANSETRON HCL 4 MG/2ML IJ SOLN: 4.0000 mg | Freq: Four times a day (QID) | INTRAMUSCULAR | Status: DC | PRN

## 2014-07-27 MED ORDER — SODIUM CHLORIDE 0.9 % IJ SOLN
3.0000 mL | Freq: Two times a day (BID) | INTRAMUSCULAR | Status: DC
Start: 2014-07-27 — End: 2014-08-05
  Administered 2014-07-28 – 2014-08-01 (×4): 3 mL via INTRAVENOUS

## 2014-07-27 MED ORDER — SODIUM CHLORIDE 0.9 % IJ SOLN
3.0000 mL | INTRAMUSCULAR | Status: DC | PRN
  Administered 2014-07-31: 3 mL via INTRAVENOUS

## 2014-07-27 MED ORDER — FUROSEMIDE 10 MG/ML IJ SOLN
20.0000 mg | Freq: Two times a day (BID) | INTRAMUSCULAR | Status: DC
  Administered 2014-07-28 – 2014-07-29 (×3): 20 mg via INTRAVENOUS
  Filled 2014-07-27 (×3): qty 2

## 2014-07-27 MED ORDER — SODIUM CHLORIDE 0.9 % IV SOLN: 250.0000 mL | INTRAVENOUS | Status: DC | PRN

## 2014-07-27 NOTE — ED Provider Notes (Signed)
CSN: [id number]     Arrival date & time 07/27/14  1904 History  This chart was scribed for Tanna Furry, MD by Randa Evens, ED Scribe. This patient was seen in room APA18/AP[facility]18 and the patient's care was started at 7:49 PM.    Chief Complaint  Patient presents with  . Shortness of Breath   Patient is a 79 y.o. female presenting with shortness of breath. The history is provided by the patient. No language interpreter was used.  Shortness of Breath Associated symptoms: cough and vomiting   Associated symptoms: no abdominal pain, no chest pain, no diaphoresis, no fever, no headaches, no rash, no sore throat and no wheezing    HPI Comments: [name] is a 78 y.o. female who presents to the Emergency Department complaining of SOB onset 3 weeks prior. She states she didn't notice the shortness of breath until about 3 weeks ago when she first noticed that her legs were swelling. She states that her SOB recently worsened over the past few days. She states she has been having associated productive cough, vomiting brought on from coughing and chest tightness. She states her symptoms worsen with deep breathing and laying down. She state she went to her PCP 2 weeks ago for an infection on her leg which she received antibiotics that provided with no relief. She states she has a Hx of cardiac catheterization with abnormal results.    History reviewed. No pertinent past medical history. Past Surgical History  Procedure Laterality Date  . Cholecystectomy    . Cardiac catheterization     History reviewed. No pertinent family history. History  Substance Use Topics  . Smoking status: Never Smoker   . Smokeless tobacco: Not on file  . Alcohol Use: No   OB History   Grav Para Term Preterm Abortions TAB SAB Ect Mult Living                 Review of Systems  Constitutional: Negative for fever, chills, diaphoresis, appetite change and fatigue.  HENT: Negative for mouth sores, sore throat and  trouble swallowing.   Eyes: Negative for visual disturbance.  Respiratory: Positive for cough, chest tightness and shortness of breath. Negative for wheezing.   Cardiovascular: Positive for leg swelling. Negative for chest pain.  Gastrointestinal: Positive for vomiting. Negative for nausea, abdominal pain, diarrhea and abdominal distention.  Endocrine: Negative for polydipsia, polyphagia and polyuria.  Genitourinary: Negative for dysuria, frequency and hematuria.  Musculoskeletal: Negative for gait problem.  Skin: Negative for color change, pallor and rash.  Neurological: Negative for dizziness, syncope, light-headedness and headaches.  Hematological: Does not bruise/bleed easily.  Psychiatric/Behavioral: Negative for behavioral problems and confusion.    Allergies  Penicillins  Home Medications   Prior to Admission medications   Not on File   Triage Vitals; BP 109/77  Pulse 108  Temp(Src) 98.2 F (36.8 C) (Oral)  Resp 24  Ht 5\' 5"  (1.651 m)  Wt 155 lb (70.308 kg)  BMI 25.79 kg/m2  SpO2 95%  Physical Exam  Nursing note and vitals reviewed. Constitutional: She is oriented to person, place, and time. She appears well-developed and well-nourished. No distress.  HENT:  Head: Normocephalic.  Eyes: Conjunctivae are normal. Pupils are equal, round, and reactive to light. No scleral icterus.  Neck: Normal range of motion. Neck supple. No thyromegaly present.  Cardiovascular: Normal rate.  Exam reveals no gallop and no friction rub.   No murmur heard. Sinus rhythm on monitor, 2+ bilateral lower  extremity edema  Pulmonary/Chest: Effort normal and breath sounds normal. No respiratory distress. She has no wheezes. She has no rales.  diminshed left sided breath sounds to  above the scapula  Abdominal: Soft. Bowel sounds are normal. She exhibits no distension. There is no tenderness. There is no rebound.  Musculoskeletal: Normal range of motion. She exhibits edema.  Neurological: She  is alert and oriented to person, place, and time.  Skin: Skin is warm and dry. No rash noted.  Psychiatric: She has a normal mood and affect. Her behavior is normal.    ED Course  Procedures (including critical care time) DIAGNOSTIC STUDIES: Oxygen Saturation is 96% on RA, normal by my interpretation.    COORDINATION OF CARE: 8:02 PM-Discussed treatment plan which includes CXR, CBC panel, and BMPwith pt at bedside and pt agreed to plan.     Labs Review Labs Reviewed  CBC - Abnormal; Notable for the following:    Hemoglobin 11.1 (*)    HCT 34.6 (*)    MCH 25.7 (*)    RDW 17.2 (*)    Platelets 401 (*)    All other components within normal limits  BASIC METABOLIC PANEL - Abnormal; Notable for the following:    Sodium 135 (*)    Potassium 3.6 (*)    Chloride 94 (*)    Glucose, Bld 115 (*)    Calcium 7.8 (*)    GFR calc non Af Amer 68 (*)    GFR calc Af Amer 79 (*)    All other components within normal limits  PRO B NATRIURETIC PEPTIDE - Abnormal; Notable for the following:    Pro B Natriuretic peptide (BNP) 456.7 (*)    All other components within normal limits  LACTATE DEHYDROGENASE, BODY FLUID - Abnormal; Notable for the following:    LD, Fluid 182 (*)    All other components within normal limits  BODY FLUID CELL COUNT WITH DIFFERENTIAL - Abnormal; Notable for the following:    Color, Fluid RED (*)    Appearance, Fluid CLOUDY (*)    Neutrophil Count, Fluid 30 (*)    Monocyte-Macrophage-Serous Fluid 19 (*)    All other components within normal limits  IRON AND TIBC - Abnormal; Notable for the following:    Iron 17 (*)    TIBC 209 (*)    Saturation Ratios 8 (*)    All other components within normal limits  CBC - Abnormal; Notable for the following:    Hemoglobin 10.9 (*)    HCT 34.1 (*)    MCH 25.6 (*)    RDW 17.2 (*)    All other components within normal limits  COMPREHENSIVE METABOLIC PANEL - Abnormal; Notable for the following:    Sodium 134 (*)    Potassium  3.2 (*)    Chloride 94 (*)    Calcium 7.5 (*)    Total Protein 5.8 (*)    Albumin 2.2 (*)    GFR calc non Af Amer 80 (*)    All other components within normal limits  BODY FLUID CULTURE  TROPONIN I  PROTEIN, BODY FLUID  LACTATE DEHYDROGENASE  GLUCOSE, SEROUS FLUID  AMYLASE, PLEURAL FLUID  VITAMIN B12  FOLATE  FERRITIN  RETICULOCYTES  PROTIME-INR  TROPONIN I  OCCULT BLOOD X 1 CARD TO LAB, STOOL  CYTOLOGY - NON PAP    Imaging Review Dg Chest 1 View  07/28/2014   CLINICAL DATA:  LEFT pleural effusion post thoracentesis  EXAM: CHEST - 1 VIEW  COMPARISON:  07/27/2014  FINDINGS: Persistent large LEFT pleural effusion despite interval removal of 1.5 L of fluid from the LEFT chest.  No pneumothorax.  Heart remains enlarged.  Minimal RIGHT basilar atelectasis.  Bones demineralized.  IMPRESSION: Persistent large LEFT pleural effusion and basilar opacification despite removal 1.5 L of fluid from the LEFT chest.  No pneumothorax post thoracentesis.   Electronically Signed   By: Lavonia Dana M.D.   On: 07/28/2014 12:01   Ct Chest W Contrast  07/29/2014   CLINICAL DATA:  79 year old female with shortness of breath, chest, abdominal and pelvic pain and pleural effusion.  EXAM: CT CHEST, ABDOMEN, AND PELVIS WITH CONTRAST  TECHNIQUE: Multidetector CT imaging of the chest, abdomen and pelvis was performed following the standard protocol during bolus administration of intravenous contrast.  CONTRAST:  14mL OMNIPAQUE IOHEXOL 300 MG/ML  SOLN  COMPARISON:  None.  FINDINGS: CT CHEST FINDINGS  The heart and great vessels are within normal limits.  A large left pleural effusion and very small right pleural effusion are noted with bilateral lower lung atelectasis, left greater than right.  A tiny pleural pericardial effusion is noted.  There is no evidence of pleural mass or definite thickening.  There is no evidence of airspace disease, definite mass, nodule, or endobronchial/ endotracheal lesion.  No enlarged  lymph nodes identified.  Mild subcutaneous edema is present.  No acute or suspicious bony abnormalities are noted.  CT ABDOMEN AND PELVIS FINDINGS  Moderate ascites is present.  Medial left/ anterior right liver scarring is noted. No focal hepatic abnormalities are otherwise identified.  Spleen, pancreas, adrenal glands and kidneys are unremarkable except for bilateral renal cortical atrophy. Patient is status post cholecystectomy.  There is no evidence of biliary dilatation, enlarged lymph nodes or abdominal aortic aneurysm.  Extensive descending and sigmoid colonic diverticulosis noted without definite diverticulitis.  There is no evidence of bowel obstruction, peritoneal thickening or nodularity or pneumoperitoneum.  Ill-defined increased density measuring 3 x 8 cm within the mesenteric is nonspecific.  No acute or suspicious bony abnormalities are identified.  IMPRESSION: Large left pleural effusion, moderate ascites, diffuse subcutaneous edema and small right pleural effusion. Associated bilateral lower lung atelectasis.  3 x 8 cm ill-defined density within the mesentery - solid mass/neoplasm is not excluded. Consider PET-CT for further evaluation as clinically indicated.  Bilateral renal cortical atrophy and colonic diverticulosis.   Electronically Signed   By: Hassan Rowan M.D.   On: 07/29/2014 12:07   Ct Abdomen Pelvis W Contrast  07/29/2014   CLINICAL DATA:  79 year old female with shortness of breath, chest, abdominal and pelvic pain and pleural effusion.  EXAM: CT CHEST, ABDOMEN, AND PELVIS WITH CONTRAST  TECHNIQUE: Multidetector CT imaging of the chest, abdomen and pelvis was performed following the standard protocol during bolus administration of intravenous contrast.  CONTRAST:  165mL OMNIPAQUE IOHEXOL 300 MG/ML  SOLN  COMPARISON:  None.  FINDINGS: CT CHEST FINDINGS  The heart and great vessels are within normal limits.  A large left pleural effusion and very small right pleural effusion are noted with  bilateral lower lung atelectasis, left greater than right.  A tiny pleural pericardial effusion is noted.  There is no evidence of pleural mass or definite thickening.  There is no evidence of airspace disease, definite mass, nodule, or endobronchial/ endotracheal lesion.  No enlarged lymph nodes identified.  Mild subcutaneous edema is present.  No acute or suspicious bony abnormalities are noted.  CT ABDOMEN AND PELVIS FINDINGS  Moderate  ascites is present.  Medial left/ anterior right liver scarring is noted. No focal hepatic abnormalities are otherwise identified.  Spleen, pancreas, adrenal glands and kidneys are unremarkable except for bilateral renal cortical atrophy. Patient is status post cholecystectomy.  There is no evidence of biliary dilatation, enlarged lymph nodes or abdominal aortic aneurysm.  Extensive descending and sigmoid colonic diverticulosis noted without definite diverticulitis.  There is no evidence of bowel obstruction, peritoneal thickening or nodularity or pneumoperitoneum.  Ill-defined increased density measuring 3 x 8 cm within the mesenteric is nonspecific.  No acute or suspicious bony abnormalities are identified.  IMPRESSION: Large left pleural effusion, moderate ascites, diffuse subcutaneous edema and small right pleural effusion. Associated bilateral lower lung atelectasis.  3 x 8 cm ill-defined density within the mesentery - solid mass/neoplasm is not excluded. Consider PET-CT for further evaluation as clinically indicated.  Bilateral renal cortical atrophy and colonic diverticulosis.   Electronically Signed   By: Hassan Rowan M.D.   On: 07/29/2014 12:07   US Thoracentesis Asp Pleural Space W/img Guide  07/28/2014   CLINICAL DATA:  Large LEFT pleural effusion  EXAM: US THORACENTESIS ASP PLEURAL SPACE W/IMG GUIDE  TECHNIQUE: Procedure, benefits, and risks of procedure were discussed with patient.  Written informed consent for procedure was obtained.  Time out protocol followed.   Pleural effusion localized by ultrasound at the posterior LEFT hemithorax.  Skin prepped and draped in usual sterile fashion.  Skin and soft tissues anesthetized with 8 mL of 1% lidocaine.  8 French thoracentesis catheter placed into the LEFT pleural space.  1500 mL of serosanguineous fluid aspirated by syringe pump.  Procedure tolerated well by patient without immediate complication.  Fluid sample of 180 mL was sent to laboratory for requested analysis.  COMPARISON:  None ; correlation chest radiograph 07/27/2014  FINDINGS: As above  IMPRESSION: Removal of 1500 mL of serosanguineous fluid from the LEFT pleural space by ultrasound guided thoracentesis.   Electronically Signed   By: Lavonia Dana M.D.   On: 07/28/2014 12:46     EKG Interpretation   Date/Time:  Wednesday July 27 2014 19:14:48 EDT Ventricular Rate:  101 PR Interval:  133 QRS Duration: 86 QT Interval:  367 QTC Calculation: 476 R Axis:   41 Text Interpretation:  Sinus tachycardia Low voltage, precordial leads   lateral/inferior q waves Non-specific ST-t changes No old tracing to  compare Confirmed by Ely  MD, STEPHEN ([id number]) on 07/27/2014 7:35:31 PM      MDM   Final diagnoses:  Pleural effusion        Large left-sided pleural effusion. Small right-sided effusion. BNP only 456.  Marland Kitchen Normal troponin. EKG shows sinus tach no acute changes. Patient is dyspneic. Unable to lay flat. Not hypoxemic sitting upright. Diagnostic and therapeutic thoracentesis indicated.  Echocardiogram needed. Differential diagnosis would include infection/pneumonia with apparent pneumonic effusion, malignant effusion, CHF considering her dependent edema.     Tanna Furry, MD 07/30/14 [phone]

## 2014-07-27 NOTE — ED Notes (Signed)
Patient states she started having swelling in bilateral lower extremities 3weeks ago. Patient states she started having shortness of breath today with chest pain.

## 2014-07-27 NOTE — H&P (Addendum)
PCP:   HAWKINS,EDWARD Carlean Jews, MD   Chief Complaint:  Shortness of breath  HPI: 79 year old female with no significant medical problems who comes to the ED with worsening shortness of breath on exertion, lower extremity edema which has been going on for past 3-4 weeks. Patient says that she developed lower extremity swelling almost 4 weeks ago, at that time she  saw primary care provider who prescribed antibiotics but patient did not feel better. She noticed that she was getting more fatigued on exertion. Patient works as a Actuary for patient, and was at her patient's house, where home health nurse who visited the patient, also checked this lady for shortness of breath. She auscultated her lungs, and told her to go to the ED for further evaluation. Patient has very mild chest pain, she is requiring oxygen, O2 sats greater than 92%. She is not hypoxic. EKG showed nonspecific ST-T changes along with Q waves in leads 2, 3 and aVF. Chest x-ray showed large left pleural effusion.  Allergies:   Allergies  Allergen Reactions  . Penicillins      History reviewed. No pertinent past medical history.  Past Surgical History  Procedure Laterality Date  . Cholecystectomy    . Cardiac catheterization      Prior to Admission medications   Not on File    Social History:  reports that she has never smoked. She does not have any smokeless tobacco history on file. She reports that she does not drink alcohol or use illicit drugs.  History reviewed. No pertinent family history.   All the positives are listed in BOLD  Review of Systems:  HEENT: Headache, blurred vision, runny nose, sore throat Neck: Hypothyroidism, hyperthyroidism,,lymphadenopathy Chest : Shortness of breath, history of COPD, Asthma Heart : Chest pain, history of coronary arterey disease GI:  Nausea, vomiting, diarrhea, constipation, GERD GU: Dysuria, urgency, frequency of urination, hematuria Neuro: Stroke, seizures,  syncope Psych: Depression, anxiety, hallucinations   Physical Exam: Blood pressure 109/57, pulse 93, temperature 98.2 F (36.8 C), temperature source Oral, resp. rate 21, height 5\' 5"  (1.651 m), weight 70.308 kg (155 lb), SpO2 94.00%. Constitutional:   Patient is a well-developed and well-nourished female* in no acute distress and cooperative with exam. Head: Normocephalic and atraumatic Mouth: Mucus membranes moist Eyes: PERRL, EOMI, conjunctivae normal Neck: Supple, No Thyromegaly Cardiovascular: RRR, S1 normal, S2 normal, grade 2/6 systolic murmur auscultated in the aortic area in the upper right sternal border Pulmonary/Chest: Clear to auscultation on right, decreased breath sounds auscultated on the left Abdominal: Soft. Non-tender, non-distended, bowel sounds are normal, no masses, organomegaly, or guarding present.  Neurological: A&O x3, Strenght is normal and symmetric bilaterally, cranial nerve II-XII are grossly intact, no focal motor deficit, sensory intact to light touch bilaterally.  Extremities : Bilateral 2+ pitting edema of the lower extremities  Labs on Admission:  Basic Metabolic Panel:  Recent Labs Lab 07/27/14 1942  NA 135*  K 3.6*  CL 94*  CO2 29  GLUCOSE 115*  BUN 22  CREATININE 0.81  CALCIUM 7.8*   CBC:  Recent Labs Lab 07/27/14 1942  WBC 6.9  HGB 11.1*  HCT 34.6*  MCV 80.1  PLT 401*   Cardiac Enzymes:  Recent Labs Lab 07/27/14 1942  TROPONINI <0.30    BNP (last 3 results)  Recent Labs  07/27/14 1942  PROBNP 456.7*   CBG: No results found for this basename: GLUCAP,  in the last 168 hours  Radiological Exams on Admission: Dg Chest  Port 1 View  07/27/2014   CLINICAL DATA:  Shortness of breath tonight.  EXAM: PORTABLE CHEST - 1 VIEW  COMPARISON:  11/05/2012.  FINDINGS: Interval large left pleural effusion. Mild adjacent left lung atelectasis. The right lung is clear. The heart size is difficult to assess due to the obscuration of the  left heart borders. There is some increased mediastinal shift to the right. Minimal right pleural effusion. Diffuse osteopenia.  IMPRESSION: 1. Interval large left pleural effusion with mild adjacent left lung atelectasis. There is associated increased mediastinal shift to the right. 2. Minimal right pleural effusion.   Electronically Signed   By: Enrique Sack M.D.   On: 07/27/2014 19:36    EKG: Independently reviewed. Normal sinus rhythm, nonspecific ST-T changes   Assessment/Plan Principal Problem:   Pleural effusion Active Problems:   Edema of both legs  Pleural effusion Patient has large left pleural effusion with mild adjacent left lung atelectasis also increased mediastinal shift to the right. Patient at this time is stable and does not require emergent thoracentesis. We'll admit the patient and order ultrasound-guided thoracentesis in a.m. Will send the pleural fluid for analysis to elucidate transudate versus exudate. Will check pleural LDH, protein, glucose, cell count, cytology, culture, amylase. Thoracentesis should be both diagnostic as well as  Therapeutic.  Lower extremity edema ? Cause, will check patient's albumin in a.m. start Lasix 20 mg IV every 12 hours, check BMP in a.m. her BNP is 456.7, will also obtain 2-D echocardiogram in a.m.  ? Melena Patient also complains of black colored stools, hemoglobin is 11.1 will check anemia panel. Also obtain stool for occult blood.  DVT prophylaxis Lovenox  Code status: patient is DO NOT RESUSCITATE  Family discussion: Admission, patients condition and plan of care including tests being ordered have been discussed with the patient and her granddaughter and grandson at bedside who indicate understanding and agree with the plan and Code Status.   Time Spent on Admission: 65 minutes  [facility] Pager: [phone] 07/27/2014, 9:38 PM  If 7PM-7AM, please contact night-coverage  [email or web address]  Password  [email or web address]

## 2014-07-28 ENCOUNTER — Inpatient Hospital Stay (HOSPITAL_COMMUNITY): Payer: Medicare HMO

## 2014-07-28 DIAGNOSIS — R0609 Other forms of dyspnea: Secondary | ICD-10-CM

## 2014-07-28 DIAGNOSIS — R609 Edema, unspecified: Secondary | ICD-10-CM

## 2014-07-28 DIAGNOSIS — R0989 Other specified symptoms and signs involving the circulatory and respiratory systems: Secondary | ICD-10-CM

## 2014-07-28 LAB — FOLATE: FOLATE: 4.5 ng/mL

## 2014-07-28 LAB — CBC
HEMATOCRIT: 34.1 % — AB (ref 36.0–46.0)
HEMOGLOBIN: 10.9 g/dL — AB (ref 12.0–15.0)
MCH: 25.6 pg — ABNORMAL LOW (ref 26.0–34.0)
MCHC: 32 g/dL (ref 30.0–36.0)
MCV: 80.2 fL (ref 78.0–100.0)
Platelets: 362 10*3/uL (ref 150–400)
RBC: 4.25 MIL/uL (ref 3.87–5.11)
RDW: 17.2 % — ABNORMAL HIGH (ref 11.5–15.5)
WBC: 6.3 10*3/uL (ref 4.0–10.5)

## 2014-07-28 LAB — BODY FLUID CELL COUNT WITH DIFFERENTIAL
Eos, Fluid: 1 %
LYMPHS FL: 50 %
MONOCYTE-MACROPHAGE-SEROUS FLUID: 19 % — AB (ref 50–90)
NEUTROPHIL FLUID: 30 % — AB (ref 0–25)
WBC FLUID: 834 uL (ref 0–1000)

## 2014-07-28 LAB — VITAMIN B12: Vitamin B-12: 429 pg/mL (ref 211–911)

## 2014-07-28 LAB — COMPREHENSIVE METABOLIC PANEL
ALT: 13 U/L (ref 0–35)
AST: 19 U/L (ref 0–37)
Albumin: 2.2 g/dL — ABNORMAL LOW (ref 3.5–5.2)
Alkaline Phosphatase: 63 U/L (ref 39–117)
Anion gap: 10 (ref 5–15)
BILIRUBIN TOTAL: 0.4 mg/dL (ref 0.3–1.2)
BUN: 21 mg/dL (ref 6–23)
CHLORIDE: 94 meq/L — AB (ref 96–112)
CO2: 30 meq/L (ref 19–32)
CREATININE: 0.75 mg/dL (ref 0.50–1.10)
Calcium: 7.5 mg/dL — ABNORMAL LOW (ref 8.4–10.5)
GFR calc Af Amer: >90 mL/min (ref >90)
GFR, EST NON AFRICAN AMERICAN: 80 mL/min — AB (ref >90)
Glucose, Bld: 98 mg/dL (ref 70–99)
Potassium: 3.2 mEq/L — ABNORMAL LOW (ref 3.7–5.3)
Sodium: 134 mEq/L — ABNORMAL LOW (ref 137–147)
Total Protein: 5.8 g/dL — ABNORMAL LOW (ref 6.0–8.3)

## 2014-07-28 LAB — PROTIME-INR
INR: 1.09 (ref 0.00–1.49)
Prothrombin Time: 14.1 seconds (ref 11.6–15.2)

## 2014-07-28 LAB — PROTEIN, BODY FLUID: TOTAL PROTEIN, FLUID: 3.6 g/dL

## 2014-07-28 LAB — LACTATE DEHYDROGENASE, PLEURAL OR PERITONEAL FLUID: LD, Fluid: 182 U/L — ABNORMAL HIGH (ref 3–23)

## 2014-07-28 LAB — GLUCOSE, SEROUS FLUID: Glucose, Fluid: 67 mg/dL

## 2014-07-28 LAB — IRON AND TIBC
IRON: 17 ug/dL — AB (ref 42–135)
Saturation Ratios: 8 % — ABNORMAL LOW (ref 20–55)
TIBC: 209 ug/dL — AB (ref 250–470)
UIBC: 192 ug/dL (ref 125–400)

## 2014-07-28 LAB — FERRITIN: Ferritin: 166 ng/mL (ref 10–291)

## 2014-07-28 LAB — TROPONIN I: Troponin I: <0.3 ng/mL (ref <0.30)

## 2014-07-28 MED ORDER — ALPRAZOLAM 0.5 MG PO TABS
0.5000 mg | ORAL_TABLET | Freq: Once | ORAL | Status: AC
  Administered 2014-07-28: 0.5 mg via ORAL
  Filled 2014-07-28: qty 1

## 2014-07-28 MED ORDER — ALUM & MAG HYDROXIDE-SIMETH 200-200-20 MG/5ML PO SUSP
30.0000 mL | ORAL | Status: DC | PRN
  Administered 2014-07-28 – 2014-07-30 (×2): 30 mL via ORAL
  Filled 2014-07-28 (×2): qty 30

## 2014-07-28 MED ORDER — HYDROCOD POLST-CHLORPHEN POLST 10-8 MG/5ML PO LQCR
5.0000 mL | Freq: Once | ORAL | Status: AC
  Administered 2014-07-28: 5 mL via ORAL
  Filled 2014-07-28: qty 5

## 2014-07-28 NOTE — Progress Notes (Signed)
  Echocardiogram 2D Echocardiogram has been performed.  [place], [place] 07/28/2014, 3:38 PM

## 2014-07-28 NOTE — Procedures (Signed)
PreOperative Dx: LEFT pleural effusion Postoperative Dx: LEFT pleural effusion Procedure:   US guided LEFT thoracentesis Radiologist:  Thornton Papas Anesthesia:  8 ml of 1 lidocaine Specimen:  1500 ml of serosanguinous fluid EBL:   < 1 ml Complications: None

## 2014-07-28 NOTE — Progress Notes (Signed)
Subjective: She was admitted last night with a large pleural effusion and shortness of breath. She has about a 3 week history of problems with swelling it was in her legs. Her legs also showed signs of cellulitis at that time and this was treated. She was not having any shortness of breath until about 3-4 days ago. She's been having cough. She is now having orthopnea and PND. She has a history of cardiac catheterization but I cannot find a report on that in the electronic medical record  Objective: Vital signs in last 24 hours: Temp:  [97.5 F (36.4 C)-98.2 F (36.8 C)] 97.5 F (36.4 C) (08/27 0648) Pulse Rate:  [86-108] 86 (08/27 0648) Resp:  [16-24] 20 (08/27 0648) BP: (109-124)/(56-83) 115/56 mmHg (08/27 0648) SpO2:  [93 %-96 %] 95 % (08/27 0755) Weight:  [70.308 kg (155 lb)-71.85 kg (158 lb 6.4 oz)] 71.85 kg (158 lb 6.4 oz) (08/26 2249) Weight change:  Last BM Date: 07/27/14  Intake/Output from previous day: 08/26 0701 - 08/27 0700 In: -  Out: 200 [Urine:200]  PHYSICAL EXAM General appearance: alert, cooperative and moderate distress Resp: normal percussion bilaterally and She has a diminished breath sounds on the left.  the phrase normal percussion bilaterally is in error. Cardio: regular rate and rhythm, S1, S2 normal, no murmur, click, rub or gallop GI: soft, non-tender; bowel sounds normal; no masses,  no organomegaly Extremities: extremities normal, atraumatic, no cyanosis or edema  Lab Results:  Results for orders placed during the hospital encounter of 07/27/14 (from the past 48 hour(s))  CBC     Status: Abnormal   Collection Time    07/27/14  7:42 PM      Result Value Ref Range   WBC 6.9  4.0 - 10.5 K/uL   RBC 4.32  3.87 - 5.11 MIL/uL   Hemoglobin 11.1 (*) 12.0 - 15.0 g/dL   HCT 34.6 (*) 36.0 - 46.0 %   MCV 80.1  78.0 - 100.0 fL   MCH 25.7 (*) 26.0 - 34.0 pg   MCHC 32.1  30.0 - 36.0 g/dL   RDW 17.2 (*) 11.5 - 15.5 %   Platelets 401 (*) 150 - 400 K/uL  BASIC  METABOLIC PANEL     Status: Abnormal   Collection Time    07/27/14  7:42 PM      Result Value Ref Range   Sodium 135 (*) 137 - 147 mEq/L   Potassium 3.6 (*) 3.7 - 5.3 mEq/L   Chloride 94 (*) 96 - 112 mEq/L   CO2 29  19 - 32 mEq/L   Glucose, Bld 115 (*) 70 - 99 mg/dL   BUN 22  6 - 23 mg/dL   Creatinine, Ser 0.81  0.50 - 1.10 mg/dL   Calcium 7.8 (*) 8.4 - 10.5 mg/dL   GFR calc non Af Amer 68 (*) >90 mL/min   GFR calc Af Amer 79 (*) >90 mL/min   Comment: (NOTE)     The eGFR has been calculated using the CKD EPI equation.     This calculation has not been validated in all clinical situations.     eGFR's persistently <90 mL/min signify possible Chronic Kidney     Disease.   Anion gap 12  5 - 15  PRO B NATRIURETIC PEPTIDE     Status: Abnormal   Collection Time    07/27/14  7:42 PM      Result Value Ref Range   Pro B Natriuretic peptide (BNP) 456.7 (*)  0 - 450 pg/mL  TROPONIN I     Status: None   Collection Time    07/27/14  7:42 PM      Result Value Ref Range   Troponin I <0.30  <0.30 ng/mL   Comment:            Due to the release kinetics of cTnI,     a negative result within the first hours     of the onset of symptoms does not rule out     myocardial infarction with certainty.     If myocardial infarction is still suspected,     repeat the test at appropriate intervals.  LACTATE DEHYDROGENASE     Status: None   Collection Time    07/27/14 10:57 PM      Result Value Ref Range   LDH 219  94 - 250 U/L  RETICULOCYTES     Status: None   Collection Time    07/27/14 10:57 PM      Result Value Ref Range   Retic Ct Pct 1.7  0.4 - 3.1 %   RBC. 4.29  3.87 - 5.11 MIL/uL   Retic Count, Manual 72.9  19.0 - 186.0 K/uL  CBC     Status: Abnormal   Collection Time    07/28/14  5:46 AM      Result Value Ref Range   WBC 6.3  4.0 - 10.5 K/uL   RBC 4.25  3.87 - 5.11 MIL/uL   Hemoglobin 10.9 (*) 12.0 - 15.0 g/dL   HCT 34.1 (*) 36.0 - 46.0 %   MCV 80.2  78.0 - 100.0 fL   MCH 25.6 (*)  26.0 - 34.0 pg   MCHC 32.0  30.0 - 36.0 g/dL   RDW 17.2 (*) 11.5 - 15.5 %   Platelets 362  150 - 400 K/uL  PROTIME-INR     Status: None   Collection Time    07/28/14  5:46 AM      Result Value Ref Range   Prothrombin Time 14.1  11.6 - 15.2 seconds   INR 1.09  0.00 - 1.49  COMPREHENSIVE METABOLIC PANEL     Status: Abnormal   Collection Time    07/28/14  5:46 AM      Result Value Ref Range   Sodium 134 (*) 137 - 147 mEq/L   Potassium 3.2 (*) 3.7 - 5.3 mEq/L   Chloride 94 (*) 96 - 112 mEq/L   CO2 30  19 - 32 mEq/L   Glucose, Bld 98  70 - 99 mg/dL   BUN 21  6 - 23 mg/dL   Creatinine, Ser 0.75  0.50 - 1.10 mg/dL   Calcium 7.5 (*) 8.4 - 10.5 mg/dL   Total Protein 5.8 (*) 6.0 - 8.3 g/dL   Albumin 2.2 (*) 3.5 - 5.2 g/dL   AST 19  0 - 37 U/L   ALT 13  0 - 35 U/L   Alkaline Phosphatase 63  39 - 117 U/L   Total Bilirubin 0.4  0.3 - 1.2 mg/dL   GFR calc non Af Amer 80 (*) >90 mL/min   GFR calc Af Amer >90  >90 mL/min   Comment: (NOTE)     The eGFR has been calculated using the CKD EPI equation.     This calculation has not been validated in all clinical situations.     eGFR's persistently <90 mL/min signify possible Chronic Kidney     Disease.   Anion gap  10  5 - 15    ABGS No results found for this basename: PHART, PCO2, PO2ART, TCO2, HCO3,  in the last 72 hours CULTURES No results found for this or any previous visit (from the past 240 hour(s)). Studies/Results: Dg Chest Port 1 View  07/27/2014   CLINICAL DATA:  Shortness of breath tonight.  EXAM: PORTABLE CHEST - 1 VIEW  COMPARISON:  11/05/2012.  FINDINGS: Interval large left pleural effusion. Mild adjacent left lung atelectasis. The right lung is clear. The heart size is difficult to assess due to the obscuration of the left heart borders. There is some increased mediastinal shift to the right. Minimal right pleural effusion. Diffuse osteopenia.  IMPRESSION: 1. Interval large left pleural effusion with mild adjacent left lung  atelectasis. There is associated increased mediastinal shift to the right. 2. Minimal right pleural effusion.   Electronically Signed   By: Enrique Sack M.D.   On: 07/27/2014 19:36    Medications:  Prior to Admission:  No prescriptions prior to admission   Scheduled: . enoxaparin (LOVENOX) injection  40 mg Subcutaneous Q24H  . furosemide  20 mg Intravenous BID  . sodium chloride  3 mL Intravenous Q12H  . sodium chloride  3 mL Intravenous Q12H   Continuous:  ZRA:QTMAUQ chloride, ALPRAZolam, HYDROcodone-acetaminophen, ondansetron (ZOFRAN) IV, ondansetron, sodium chloride  Assesment: She has a large left pleural effusion. She does have edema. This could be related to heart failure but also could represent something like a lung cancer Principal Problem:   Pleural effusion Active Problems:   Edema of both legs    Plan: She will have a thoracentesis. She may need further evaluation after that. I will see if I can find cardiac catheterization report. She will have echocardiogram today and may need cardiology consultation    LOS: 1 day   Burnice Oestreicher L 07/28/2014, 8:37 AM

## 2014-07-28 NOTE — Progress Notes (Signed)
UR review complete.  

## 2014-07-28 NOTE — Progress Notes (Signed)
Thoracentesis complete no signs of distress. 1500 ml serosanguinous pleural fluid removed.

## 2014-07-28 NOTE — Progress Notes (Signed)
Patient arrived back from thoracentesis. Patient was alert and oriented and had no complaints. Thoracentesis site was clean, dry and intact and covered with gauze and paper tape and no drainage was noticed. Will continue to monitor patient.

## 2014-07-28 NOTE — Progress Notes (Signed)
Notified by patient that she was having some indigestion and some burning in her chest. Patient was requesting something for indigestion. Patient was alert and oriented and thoracentesis site was clean, dry and intact with no signs of infection. MD notified. MD ordered a STAT EKG, troponin X1 and 30cc maalox every 2 hours for indigestion. Will continue to monitor patient at this time.

## 2014-07-29 ENCOUNTER — Inpatient Hospital Stay (HOSPITAL_COMMUNITY): Payer: Medicare HMO

## 2014-07-29 LAB — AMYLASE, PLEURAL FLUID: Amylase, Pleural Fluid: 278 U/L

## 2014-07-29 MED ORDER — IOHEXOL 300 MG/ML  SOLN
100.0000 mL | Freq: Once | INTRAMUSCULAR | Status: AC | PRN
  Administered 2014-07-29: 100 mL via INTRAVENOUS

## 2014-07-29 MED ORDER — FUROSEMIDE 10 MG/ML IJ SOLN
40.0000 mg | Freq: Two times a day (BID) | INTRAMUSCULAR | Status: DC
Start: 2014-07-29 — End: 2014-08-01
  Administered 2014-07-29 – 2014-08-01 (×5): 40 mg via INTRAVENOUS
  Filled 2014-07-29 (×6): qty 4

## 2014-07-29 NOTE — Care Management Note (Signed)
    Page 1 of 1   07/29/2014     9:39:38 AM CARE MANAGEMENT NOTE 07/29/2014  Patient:  [name], [name]   Account Number:  [id number]  Date Initiated:  07/29/2014  Documentation initiated by:  Jolene Provost  Subjective/Objective Assessment:   Pt is from home, lives alone and has no HH services, DME's or medication needs prior to admission. 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     Action/Plan:   Pt plans to discharge home with self care. Pt may need a home O2. Pt will likely be discharged home over the weekend. At this time will sign off but will leave instructions for weekend RN on  home O2 assessment at time of DC.   Anticipated DC Date:  07/31/2014   Anticipated DC Plan:  [place]  CM consult      Choice offered to / List presented to:             Status of service:  Completed, signed off Medicare Important Message given?  YES (If response is "NO", the following Medicare IM given date fields will be blank) Date Medicare IM given:  07/29/2014 Medicare IM given by:  Jolene Provost Date Additional Medicare IM given:   Additional Medicare IM given by:    Discharge Disposition:  HOME/SELF CARE  Per UR Regulation:    If discussed at Long Length of Stay Meetings, dates discussed:    Comments:  07/29/2014 [place], RN, MSN, [facility]

## 2014-07-29 NOTE — Progress Notes (Signed)
She continues to complain of shortness of breath. She is also complaining of abdominal pain and cramping in her abdomen is "tight". Her pleural fluid looks like it is exudative. She's going to have a CT of the abdomen pelvis and chest to try to get a better idea exactly what's going on. I'm concerned that she may have a primary malignancy.

## 2014-07-29 NOTE — Clinical Documentation Improvement (Signed)
Patient presents with "SOB" and "edema of both legs"  BNP 457 work up for CHF..2 D Echo, IV Lasix given, please document type and acuity of "CHF" if present and appropriate diagnosis.  Thank you   Possible Clinical Conditions?  Chronic Systolic Congestive Heart Failure Chronic Diastolic Congestive Heart Failure  Acute Systolic Congestive Heart Failure Acute Diastolic Congestive Heart Failure  Acute on Chronic Systolic Congestive Heart Failure Acute on Chronic Diastolic Congestive Heart Failure  Other Condition Cannot Clinically Determine   Risk Factors: Pleural effusion  Signs & Symptoms: edema of bilateral legs, sob Diagnostics: Echo, BNP Treatment: IV Lasix  Thank You, Ree Kida ,RN Clinical Documentation Specialist:  [phone]  [facility] [employer] [facility]

## 2014-07-30 ENCOUNTER — Inpatient Hospital Stay (HOSPITAL_COMMUNITY): Payer: Medicare HMO

## 2014-07-30 DIAGNOSIS — I5033 Acute on chronic diastolic (congestive) heart failure: Secondary | ICD-10-CM | POA: Diagnosis present

## 2014-07-30 LAB — BLOOD GAS, ARTERIAL
Acid-Base Excess: 7.3 mmol/L — ABNORMAL HIGH (ref 0.0–2.0)
Bicarbonate: 32 mEq/L — ABNORMAL HIGH (ref 20.0–24.0)
Drawn by: 22223
O2 Content: 2 L/min
O2 SAT: 95.8 %
PH ART: 7.403 (ref 7.350–7.450)
PO2 ART: 84.4 mmHg (ref 80.0–100.0)
Patient temperature: 37
TCO2: 29.2 mmol/L (ref 0–100)
pCO2 arterial: 52.5 mmHg — ABNORMAL HIGH (ref 35.0–45.0)

## 2014-07-30 MED ORDER — LISINOPRIL 10 MG PO TABS
10.0000 mg | ORAL_TABLET | Freq: Every day | ORAL | Status: DC
  Administered 2014-07-30: 10 mg via ORAL
  Filled 2014-07-30: qty 1

## 2014-07-30 MED ORDER — POTASSIUM CHLORIDE CRYS ER 20 MEQ PO TBCR
20.0000 meq | EXTENDED_RELEASE_TABLET | Freq: Two times a day (BID) | ORAL | Status: DC
  Administered 2014-07-30 – 2014-08-03 (×8): 20 meq via ORAL
  Filled 2014-07-30 (×9): qty 1

## 2014-07-30 MED ORDER — HYDROMORPHONE HCL PF 1 MG/ML IJ SOLN
1.0000 mg | INTRAMUSCULAR | Status: DC | PRN
  Administered 2014-07-30 – 2014-08-02 (×4): 1 mg via INTRAVENOUS
  Filled 2014-07-30 (×4): qty 1

## 2014-07-30 MED ORDER — SODIUM CHLORIDE 0.9 % IV BOLUS (SEPSIS)
500.0000 mL | Freq: Once | INTRAVENOUS | Status: AC
  Administered 2014-07-30: 500 mL via INTRAVENOUS

## 2014-07-30 NOTE — Progress Notes (Signed)
Subjective: She feels somewhat better. She has no new complaints. Her CT of chest abdomen and pelvis yesterday showed that she still has a large left pleural effusion, small right pleural effusion and small pericardial effusion. Her echocardiogram could not give [place] a good estimate of diastolic function. She has ascites on her abdominal CT and has a nonspecific mesenteric area but the rest of the CT was okay. She is being treated for congestive heart failure now which is presumably diastolic since her systolic function by echocardiogram was normal  Objective: Vital signs in last 24 hours: Temp:  [98 F (36.7 C)-98.2 F (36.8 C)] 98.1 F (36.7 C) (08/29 0836) Pulse Rate:  [86-90] 89 (08/29 0836) Resp:  [18-20] 20 (08/29 0836) BP: (99-117)/(52-68) 113/55 mmHg (08/29 0836) SpO2:  [95 %-99 %] 97 % (08/29 0836) Weight change:  Last BM Date: 07/27/14  Intake/Output from previous day: 08/28 0701 - 08/29 0700 In: 640 [P.O.:640] Out: 200 [Urine:200]  PHYSICAL EXAM General appearance: alert, cooperative and mild distress Resp: Diminished breath sounds on the left otherwise clear Cardio: regular rate and rhythm, S1, S2 normal, no murmur, click, rub or gallop GI: soft, non-tender; bowel sounds normal; no masses,  no organomegaly Extremities: Trace edema bilaterally  Lab Results:  Results for orders placed during the hospital encounter of 07/27/14 (from the past 48 hour(s))  PROTEIN, BODY FLUID     Status: None   Collection Time    07/28/14 11:46 AM      Result Value Ref Range   Total protein, fluid 3.6     Comment: NO NORMAL RANGE ESTABLISHED FOR THIS TEST   Fluid Type-FTP Pleural, L    LACTATE DEHYDROGENASE, BODY FLUID     Status: Abnormal   Collection Time    07/28/14 11:46 AM      Result Value Ref Range   LD, Fluid 182 (*) 3 - 23 U/L   Fluid Type-FLDH Pleural, L    BODY FLUID CELL COUNT WITH DIFFERENTIAL     Status: Abnormal   Collection Time    07/28/14 11:46 AM      Result  Value Ref Range   Fluid Type-FCT PLEURAL     Comment: LEFT     CORRECTED ON 08/27 AT 1323: PREVIOUSLY REPORTED AS Body Fluid   Color, Fluid RED (*) YELLOW   Appearance, Fluid CLOUDY (*) CLEAR   WBC, Fluid 834  0 - 1000 cu mm   Neutrophil Count, Fluid 30 (*) 0 - 25 %   Lymphs, Fluid 50     Monocyte-Macrophage-Serous Fluid 19 (*) 50 - 90 %   Eos, Fluid 1     Other Cells, Fluid FEW     Comment: OTHER CELLS IDENTIFIED AS MESOTHELIAL CELLS     WITH SOME REACTIVE/ATYPICAL CELLS SEEN.     PENDING PATHOLOGIST REVIEW  GLUCOSE, SEROUS FLUID     Status: None   Collection Time    07/28/14 11:46 AM      Result Value Ref Range   Glucose, Fluid 67     Comment:            FLUID GLUCOSE LEVELS OF <60     mg/dL OR VALUES OF 40 mg/dL     LESS THAN A SIMULTANEOUS     SERUM LEVEL ARE CONSIDERED     DECREASED.   Fluid Type-FGLU PLEURAL     Comment: LEFT     CORRECTED ON 08/27 AT 1235: PREVIOUSLY REPORTED AS Pleural, L  AMYLASE, PLEURAL FLUID  Status: None   Collection Time    07/28/14 11:46 AM      Result Value Ref Range   Amylase, Pleural Fluid 278     Comment: (NOTE)     Reference range:     Values are considered abnormal if they are greater than or equal to     two times a simultaneously analyzed serum value.     Performed at [facility] FLUID CULTURE     Status: None   Collection Time    07/28/14 11:46 AM      Result Value Ref Range   Specimen Description FLUID PLEURAL LEFT     Special Requests NONE     Gram Stain       Value: NO WBC SEEN     NO ORGANISMS SEEN     Performed at [facility]   Culture       Value: NO GROWTH 1 DAY     Performed at [facility]   Report Status PENDING    TROPONIN I     Status: None   Collection Time    07/28/14  5:17 PM      Result Value Ref Range   Troponin I <0.30  <0.30 ng/mL   Comment:            Due to the release kinetics of cTnI,     a negative result within the first hours     of the onset of symptoms  does not rule out     myocardial infarction with certainty.     If myocardial infarction is still suspected,     repeat the test at appropriate intervals.    ABGS No results found for this basename: PHART, PCO2, PO2ART, TCO2, HCO3,  in the last 72 hours CULTURES Recent Results (from the past 240 hour(s))  BODY FLUID CULTURE     Status: None   Collection Time    07/28/14 11:46 AM      Result Value Ref Range Status   Specimen Description FLUID PLEURAL LEFT   Final   Special Requests NONE   Final   Gram Stain     Final   Value: NO WBC SEEN     NO ORGANISMS SEEN     Performed at [facility]   Culture     Final   Value: NO GROWTH 1 DAY     Performed at [facility]   Report Status PENDING   Incomplete   Studies/Results: Dg Chest 1 View  07/28/2014   CLINICAL DATA:  LEFT pleural effusion post thoracentesis  EXAM: CHEST - 1 VIEW  COMPARISON:  07/27/2014  FINDINGS: Persistent large LEFT pleural effusion despite interval removal of 1.5 L of fluid from the LEFT chest.  No pneumothorax.  Heart remains enlarged.  Minimal RIGHT basilar atelectasis.  Bones demineralized.  IMPRESSION: Persistent large LEFT pleural effusion and basilar opacification despite removal 1.5 L of fluid from the LEFT chest.  No pneumothorax post thoracentesis.   Electronically Signed   By: Lavonia Dana M.D.   On: 07/28/2014 12:01   Ct Chest W Contrast  07/29/2014   CLINICAL DATA:  79 year old female with shortness of breath, chest, abdominal and pelvic pain and pleural effusion.  EXAM: CT CHEST, ABDOMEN, AND PELVIS WITH CONTRAST  TECHNIQUE: Multidetector CT imaging of the chest, abdomen and pelvis was performed following the standard protocol during bolus administration of intravenous contrast.  CONTRAST:  152mL OMNIPAQUE IOHEXOL  300 MG/ML  SOLN  COMPARISON:  None.  FINDINGS: CT CHEST FINDINGS  The heart and great vessels are within normal limits.  A large left pleural effusion and very small right pleural  effusion are noted with bilateral lower lung atelectasis, left greater than right.  A tiny pleural pericardial effusion is noted.  There is no evidence of pleural mass or definite thickening.  There is no evidence of airspace disease, definite mass, nodule, or endobronchial/ endotracheal lesion.  No enlarged lymph nodes identified.  Mild subcutaneous edema is present.  No acute or suspicious bony abnormalities are noted.  CT ABDOMEN AND PELVIS FINDINGS  Moderate ascites is present.  Medial left/ anterior right liver scarring is noted. No focal hepatic abnormalities are otherwise identified.  Spleen, pancreas, adrenal glands and kidneys are unremarkable except for bilateral renal cortical atrophy. Patient is status post cholecystectomy.  There is no evidence of biliary dilatation, enlarged lymph nodes or abdominal aortic aneurysm.  Extensive descending and sigmoid colonic diverticulosis noted without definite diverticulitis.  There is no evidence of bowel obstruction, peritoneal thickening or nodularity or pneumoperitoneum.  Ill-defined increased density measuring 3 x 8 cm within the mesenteric is nonspecific.  No acute or suspicious bony abnormalities are identified.  IMPRESSION: Large left pleural effusion, moderate ascites, diffuse subcutaneous edema and small right pleural effusion. Associated bilateral lower lung atelectasis.  3 x 8 cm ill-defined density within the mesentery - solid mass/neoplasm is not excluded. Consider PET-CT for further evaluation as clinically indicated.  Bilateral renal cortical atrophy and colonic diverticulosis.   Electronically Signed   By: Hassan Rowan M.D.   On: 07/29/2014 12:07   Ct Abdomen Pelvis W Contrast  07/29/2014   CLINICAL DATA:  79 year old female with shortness of breath, chest, abdominal and pelvic pain and pleural effusion.  EXAM: CT CHEST, ABDOMEN, AND PELVIS WITH CONTRAST  TECHNIQUE: Multidetector CT imaging of the chest, abdomen and pelvis was performed following the  standard protocol during bolus administration of intravenous contrast.  CONTRAST:  163mL OMNIPAQUE IOHEXOL 300 MG/ML  SOLN  COMPARISON:  None.  FINDINGS: CT CHEST FINDINGS  The heart and great vessels are within normal limits.  A large left pleural effusion and very small right pleural effusion are noted with bilateral lower lung atelectasis, left greater than right.  A tiny pleural pericardial effusion is noted.  There is no evidence of pleural mass or definite thickening.  There is no evidence of airspace disease, definite mass, nodule, or endobronchial/ endotracheal lesion.  No enlarged lymph nodes identified.  Mild subcutaneous edema is present.  No acute or suspicious bony abnormalities are noted.  CT ABDOMEN AND PELVIS FINDINGS  Moderate ascites is present.  Medial left/ anterior right liver scarring is noted. No focal hepatic abnormalities are otherwise identified.  Spleen, pancreas, adrenal glands and kidneys are unremarkable except for bilateral renal cortical atrophy. Patient is status post cholecystectomy.  There is no evidence of biliary dilatation, enlarged lymph nodes or abdominal aortic aneurysm.  Extensive descending and sigmoid colonic diverticulosis noted without definite diverticulitis.  There is no evidence of bowel obstruction, peritoneal thickening or nodularity or pneumoperitoneum.  Ill-defined increased density measuring 3 x 8 cm within the mesenteric is nonspecific.  No acute or suspicious bony abnormalities are identified.  IMPRESSION: Large left pleural effusion, moderate ascites, diffuse subcutaneous edema and small right pleural effusion. Associated bilateral lower lung atelectasis.  3 x 8 cm ill-defined density within the mesentery - solid mass/neoplasm is not excluded. Consider PET-CT for further  evaluation as clinically indicated.  Bilateral renal cortical atrophy and colonic diverticulosis.   Electronically Signed   By: Hassan Rowan M.D.   On: 07/29/2014 12:07   US Thoracentesis Asp  Pleural Space W/img Guide  07/28/2014   CLINICAL DATA:  Large LEFT pleural effusion  EXAM: US THORACENTESIS ASP PLEURAL SPACE W/IMG GUIDE  TECHNIQUE: Procedure, benefits, and risks of procedure were discussed with patient.  Written informed consent for procedure was obtained.  Time out protocol followed.  Pleural effusion localized by ultrasound at the posterior LEFT hemithorax.  Skin prepped and draped in usual sterile fashion.  Skin and soft tissues anesthetized with 8 mL of 1% lidocaine.  8 French thoracentesis catheter placed into the LEFT pleural space.  1500 mL of serosanguineous fluid aspirated by syringe pump.  Procedure tolerated well by patient without immediate complication.  Fluid sample of 180 mL was sent to laboratory for requested analysis.  COMPARISON:  None ; correlation chest radiograph 07/27/2014  FINDINGS: As above  IMPRESSION: Removal of 1500 mL of serosanguineous fluid from the LEFT pleural space by ultrasound guided thoracentesis.   Electronically Signed   By: Lavonia Dana M.D.   On: 07/28/2014 12:46    Medications:  Prior to Admission:  No prescriptions prior to admission   Scheduled: . enoxaparin (LOVENOX) injection  40 mg Subcutaneous Q24H  . furosemide  40 mg Intravenous BID  . lisinopril  10 mg Oral Daily  . potassium chloride  20 mEq Oral BID  . sodium chloride  3 mL Intravenous Q12H  . sodium chloride  3 mL Intravenous Q12H   Continuous:  IWP:YKDXIP chloride, ALPRAZolam, alum & mag hydroxide-simeth, HYDROcodone-acetaminophen, ondansetron (ZOFRAN) IV, ondansetron, sodium chloride  Assesment: She has what appears to be diastolic heart failure with acute decompensation. She has a large left pleural effusion which appears to be exudative so may be chronic from heart failure. She did not show any abnormalities on CT it would explain this. Cytology is still pending. She has a nonspecific abnormality on her CT abdomen and that will need to be repeated and perhaps have PET  scan. She did not show anything else it was suggested that she had any sort of carcinoma. She has a skin lesion on her left leg it may be a skin cancer and that will be treated as an outpatient Principal Problem:   Heart failure, diastolic, with acute decompensation Active Problems:   Pleural effusion   Edema of both legs    Plan: I increased her Lasix. She will start lisinopril. She will continue other treatments. She may need another thoracentesis. She will have cardiology consultation with cardiology is available    LOS: 3 days   Omere Marti L 07/30/2014, 10:17 AM

## 2014-07-31 LAB — BODY FLUID CULTURE
Culture: NO GROWTH
GRAM STAIN: NONE SEEN

## 2014-07-31 LAB — TROPONIN I

## 2014-07-31 LAB — BASIC METABOLIC PANEL
Anion gap: 11 (ref 5–15)
BUN: 21 mg/dL (ref 6–23)
CO2: 31 mEq/L (ref 19–32)
Calcium: 7.7 mg/dL — ABNORMAL LOW (ref 8.4–10.5)
Chloride: 91 mEq/L — ABNORMAL LOW (ref 96–112)
Creatinine, Ser: 1.37 mg/dL — ABNORMAL HIGH (ref 0.50–1.10)
GFR calc Af Amer: 42 mL/min — ABNORMAL LOW (ref >90)
GFR, EST NON AFRICAN AMERICAN: 36 mL/min — AB (ref >90)
Glucose, Bld: 104 mg/dL — ABNORMAL HIGH (ref 70–99)
POTASSIUM: 4.3 meq/L (ref 3.7–5.3)
Sodium: 133 mEq/L — ABNORMAL LOW (ref 137–147)

## 2014-07-31 MED ORDER — LEVOFLOXACIN IN D5W 500 MG/100ML IV SOLN
500.0000 mg | INTRAVENOUS | Status: DC
  Administered 2014-07-31 – 2014-08-01 (×2): 500 mg via INTRAVENOUS
  Filled 2014-07-31 (×4): qty 100

## 2014-07-31 NOTE — Progress Notes (Signed)
Troponin lab not drawn at 1900 - was drawn at 0400 - md notified of negative result.

## 2014-07-31 NOTE — Progress Notes (Signed)
Subjective: She had more trouble last night with shortness of breath and was hypotensive. This was after this she got started on increased dose of Lasix and on lisinopril. She did not have any evidence of acute MI. Her chest x-ray shows that she has some infiltrate as well as the effusion now. She actually looks very comfortable at this point  Objective: Vital signs in last 24 hours: Temp:  [97.3 F (36.3 C)-97.7 F (36.5 C)] 97.6 F (36.4 C) (08/30 0628) Pulse Rate:  [84-94] 93 (08/30 0628) Resp:  [18-20] 18 (08/30 0628) BP: (77-106)/(47-59) 97/56 mmHg (08/30 0628) SpO2:  [95 %-97 %] 97 % (08/30 0628) Weight change:  Last BM Date: 07/27/14  Intake/Output from previous day: 08/29 0701 - 08/30 0700 In: 480 [P.O.:480] Out: 500 [Urine:500]  PHYSICAL EXAM General appearance: alert, cooperative and no distress Resp: Markedly diminished breath sounds on the left Cardio: regular rate and rhythm, S1, S2 normal, no murmur, click, rub or gallop GI: soft, non-tender; bowel sounds normal; no masses,  no organomegaly Extremities: extremities normal, atraumatic, no cyanosis or edema  Lab Results:  Results for orders placed during the hospital encounter of 07/27/14 (from the past 48 hour(s))  BLOOD GAS, ARTERIAL     Status: Abnormal   Collection Time    07/30/14  7:00 PM      Result Value Ref Range   O2 Content 2.0     Delivery systems NASAL CANNULA     pH, Arterial 7.403  7.350 - 7.450   pCO2 arterial 52.5 (*) 35.0 - 45.0 mmHg   pO2, Arterial 84.4  80.0 - 100.0 mmHg   Bicarbonate 32.0 (*) 20.0 - 24.0 mEq/L   TCO2 29.2  0 - 100 mmol/L   Acid-Base Excess 7.3 (*) 0.0 - 2.0 mmol/L   O2 Saturation 95.8     Patient temperature 37.0     Collection site LEFT RADIAL     Drawn by [id number]     Sample type ARTERIAL     Allens test (pass/fail) PASS  PASS  BASIC METABOLIC PANEL     Status: Abnormal   Collection Time    07/31/14  3:52 AM      Result Value Ref Range   Sodium 133 (*) 137 - 147  mEq/L   Potassium 4.3  3.7 - 5.3 mEq/L   Chloride 91 (*) 96 - 112 mEq/L   CO2 31  19 - 32 mEq/L   Glucose, Bld 104 (*) 70 - 99 mg/dL   BUN 21  6 - 23 mg/dL   Creatinine, Ser 1.37 (*) 0.50 - 1.10 mg/dL   Calcium 7.7 (*) 8.4 - 10.5 mg/dL   GFR calc non Af Amer 36 (*) >90 mL/min   GFR calc Af Amer 42 (*) >90 mL/min   Comment: (NOTE)     The eGFR has been calculated using the CKD EPI equation.     This calculation has not been validated in all clinical situations.     eGFR's persistently <90 mL/min signify possible Chronic Kidney     Disease.   Anion gap 11  5 - 15  TROPONIN I     Status: None   Collection Time    07/31/14  3:52 AM      Result Value Ref Range   Troponin I <0.30  <0.30 ng/mL   Comment:            Due to the release kinetics of cTnI,     a negative result  within the first hours     of the onset of symptoms does not rule out     myocardial infarction with certainty.     If myocardial infarction is still suspected,     repeat the test at appropriate intervals.    ABGS  Recent Labs  07/30/14 1900  PHART 7.403  PO2ART 84.4  TCO2 29.2  HCO3 32.0*   CULTURES Recent Results (from the past 240 hour(s))  BODY FLUID CULTURE     Status: None   Collection Time    07/28/14 11:46 AM      Result Value Ref Range Status   Specimen Description FLUID PLEURAL LEFT   Final   Special Requests NONE   Final   Gram Stain     Final   Value: NO WBC SEEN     NO ORGANISMS SEEN     Performed at [facility]   Culture     Final   Value: NO GROWTH 2 DAYS     Performed at [facility]   Report Status PENDING   Incomplete   Studies/Results: Ct Chest W Contrast  07/29/2014   CLINICAL DATA:  79 year old female with shortness of breath, chest, abdominal and pelvic pain and pleural effusion.  EXAM: CT CHEST, ABDOMEN, AND PELVIS WITH CONTRAST  TECHNIQUE: Multidetector CT imaging of the chest, abdomen and pelvis was performed following the standard protocol during bolus  administration of intravenous contrast.  CONTRAST:  1107m OMNIPAQUE IOHEXOL 300 MG/ML  SOLN  COMPARISON:  None.  FINDINGS: CT CHEST FINDINGS  The heart and great vessels are within normal limits.  A large left pleural effusion and very small right pleural effusion are noted with bilateral lower lung atelectasis, left greater than right.  A tiny pleural pericardial effusion is noted.  There is no evidence of pleural mass or definite thickening.  There is no evidence of airspace disease, definite mass, nodule, or endobronchial/ endotracheal lesion.  No enlarged lymph nodes identified.  Mild subcutaneous edema is present.  No acute or suspicious bony abnormalities are noted.  CT ABDOMEN AND PELVIS FINDINGS  Moderate ascites is present.  Medial left/ anterior right liver scarring is noted. No focal hepatic abnormalities are otherwise identified.  Spleen, pancreas, adrenal glands and kidneys are unremarkable except for bilateral renal cortical atrophy. Patient is status post cholecystectomy.  There is no evidence of biliary dilatation, enlarged lymph nodes or abdominal aortic aneurysm.  Extensive descending and sigmoid colonic diverticulosis noted without definite diverticulitis.  There is no evidence of bowel obstruction, peritoneal thickening or nodularity or pneumoperitoneum.  Ill-defined increased density measuring 3 x 8 cm within the mesenteric is nonspecific.  No acute or suspicious bony abnormalities are identified.  IMPRESSION: Large left pleural effusion, moderate ascites, diffuse subcutaneous edema and small right pleural effusion. Associated bilateral lower lung atelectasis.  3 x 8 cm ill-defined density within the mesentery - solid mass/neoplasm is not excluded. Consider PET-CT for further evaluation as clinically indicated.  Bilateral renal cortical atrophy and colonic diverticulosis.   Electronically Signed   By: JHassan RowanM.D.   On: 07/29/2014 12:07   Ct Abdomen Pelvis W Contrast  07/29/2014   CLINICAL  DATA:  79year old female with shortness of breath, chest, abdominal and pelvic pain and pleural effusion.  EXAM: CT CHEST, ABDOMEN, AND PELVIS WITH CONTRAST  TECHNIQUE: Multidetector CT imaging of the chest, abdomen and pelvis was performed following the standard protocol during bolus administration of intravenous contrast.  CONTRAST:  1054m  OMNIPAQUE IOHEXOL 300 MG/ML  SOLN  COMPARISON:  None.  FINDINGS: CT CHEST FINDINGS  The heart and great vessels are within normal limits.  A large left pleural effusion and very small right pleural effusion are noted with bilateral lower lung atelectasis, left greater than right.  A tiny pleural pericardial effusion is noted.  There is no evidence of pleural mass or definite thickening.  There is no evidence of airspace disease, definite mass, nodule, or endobronchial/ endotracheal lesion.  No enlarged lymph nodes identified.  Mild subcutaneous edema is present.  No acute or suspicious bony abnormalities are noted.  CT ABDOMEN AND PELVIS FINDINGS  Moderate ascites is present.  Medial left/ anterior right liver scarring is noted. No focal hepatic abnormalities are otherwise identified.  Spleen, pancreas, adrenal glands and kidneys are unremarkable except for bilateral renal cortical atrophy. Patient is status post cholecystectomy.  There is no evidence of biliary dilatation, enlarged lymph nodes or abdominal aortic aneurysm.  Extensive descending and sigmoid colonic diverticulosis noted without definite diverticulitis.  There is no evidence of bowel obstruction, peritoneal thickening or nodularity or pneumoperitoneum.  Ill-defined increased density measuring 3 x 8 cm within the mesenteric is nonspecific.  No acute or suspicious bony abnormalities are identified.  IMPRESSION: Large left pleural effusion, moderate ascites, diffuse subcutaneous edema and small right pleural effusion. Associated bilateral lower lung atelectasis.  3 x 8 cm ill-defined density within the mesentery -  solid mass/neoplasm is not excluded. Consider PET-CT for further evaluation as clinically indicated.  Bilateral renal cortical atrophy and colonic diverticulosis.   Electronically Signed   By: Hassan Rowan M.D.   On: 07/29/2014 12:07   Dg Chest Port 1 View  07/30/2014   CLINICAL DATA:  Worsening chest pain. Prior left thoracentesis earlier this week.  EXAM: PORTABLE CHEST - 1 VIEW  COMPARISON:  02/25/2014  FINDINGS: Continued large left pleural effusion, likely slightly increased since prior study. Increasing airspace disease in the aerated left upper lobe. No pneumothorax. Cardiomegaly. No confluent opacity on the right.  IMPRESSION: Continued large left pleural effusion, likely slightly increased. Airspace disease now throughout the aerated left upper lobe.  Cardiomegaly.   Electronically Signed   By: Rolm Baptise M.D.   On: 07/30/2014 18:35    Medications:  Prior to Admission:  No prescriptions prior to admission   Scheduled: . enoxaparin (LOVENOX) injection  40 mg Subcutaneous Q24H  . furosemide  40 mg Intravenous BID  . potassium chloride  20 mEq Oral BID  . sodium chloride  3 mL Intravenous Q12H  . sodium chloride  3 mL Intravenous Q12H   Continuous:  DXI:PJASNK chloride, ALPRAZolam, alum & mag hydroxide-simeth, HYDROcodone-acetaminophen, HYDROmorphone (DILAUDID) injection, ondansetron (ZOFRAN) IV, ondansetron, sodium chloride  Assesment: She was admitted with a large left pleural effusion. This is exudative. She now has some infiltrate and will need to be treated for pneumonia. She has heart failure which is diastolic. She had hypotension which is better. Principal Problem:   Heart failure, diastolic, with acute decompensation Active Problems:   Pleural effusion   Edema of both legs    Plan: Cardiology consult in the morning thoracentesis again tomorrow we'll await cytology treat her for pneumonia    LOS: 4 days   Kazzandra Desaulniers L 07/31/2014, 9:58 AM

## 2014-08-01 ENCOUNTER — Inpatient Hospital Stay (HOSPITAL_COMMUNITY): Payer: Medicare HMO

## 2014-08-01 MED ORDER — PANTOPRAZOLE SODIUM 40 MG PO TBEC
40.0000 mg | DELAYED_RELEASE_TABLET | Freq: Every morning | ORAL | Status: DC
  Administered 2014-08-02 – 2014-08-05 (×4): 40 mg via ORAL
  Filled 2014-08-01 (×4): qty 1

## 2014-08-01 NOTE — Consult Note (Signed)
Consulting cardiologist: Dr Carlyle Dolly MD  Clinical Summary [name] is a 79 y.o.female no significant PMH admitted with SOB and LE edema x 3-4 weeks. She was found to have a large left sided pericardial effusion that is being followed by Dr Luan Pulling that appears to be exudative. Pulmonary is working up her unilateral exudative effusion. Due to her lower extremity edema there is some concern that there may be a component of heart failure involved, and thus cardiology has been consulted.   K 4.3, Cr 1.37, BUN 21, trop neg, Hgb 10.9, HCT 34.1, Plt 36, albumin 2.2. BNP 456 EKG SR, q-waves lateral leads do not quite meet pathologic criteria Echo LVEF 96-75%, grade I diastolic dysfunction, moderate to severe LVH, E/e' 11 consistent with elevated LA pressure, normal IVC suggesting normal RA pressure   CT chest large left pleural effusion, moderate ascites, diffuse subcutaneous edema, 3 x 8 cm mesentary mass.  Allergies  Allergen Reactions  . Penicillins     Medications Scheduled Medications: . enoxaparin (LOVENOX) injection  40 mg Subcutaneous Q24H  . furosemide  40 mg Intravenous BID  . levofloxacin (LEVAQUIN) IV  500 mg Intravenous Q24H  . potassium chloride  20 mEq Oral BID  . sodium chloride  3 mL Intravenous Q12H  . sodium chloride  3 mL Intravenous Q12H     Infusions:     PRN Medications:  sodium chloride, ALPRAZolam, alum & mag hydroxide-simeth, HYDROcodone-acetaminophen, HYDROmorphone (DILAUDID) injection, ondansetron (ZOFRAN) IV, ondansetron, sodium chloride   History reviewed. No pertinent past medical history.  Past Surgical History  Procedure Laterality Date  . Cholecystectomy    . Cardiac catheterization      History reviewed. No pertinent family history.  Social History [name] reports that she has never smoked. She does not have any smokeless tobacco history on file. [name] reports that she does not drink alcohol.  Review of  Systems CONSTITUTIONAL: No weight loss, fever, chills, weakness or fatigue.  HEENT: Eyes: No visual loss, blurred vision, double vision or yellow sclerae. No hearing loss, sneezing, congestion, runny nose or sore throat.  SKIN: No rash or itching.  CARDIOVASCULAR: No chest pain, chest pressure or chest discomfort. No palpitations or edema.  RESPIRATORY: No shortness of breath, cough or sputum.  GASTROINTESTINAL: No anorexia, nausea, vomiting or diarrhea. No abdominal pain or blood.  GENITOURINARY: no polyuria, no dysuria NEUROLOGICAL: No headache, dizziness, syncope, paralysis, ataxia, numbness or tingling in the extremities. No change in bowel or bladder control.  MUSCULOSKELETAL: No muscle, back pain, joint pain or stiffness.  HEMATOLOGIC: No anemia, bleeding or bruising.  LYMPHATICS: No enlarged nodes. No history of splenectomy.  PSYCHIATRIC: No history of depression or anxiety.      Physical Examination Blood pressure 133/58, pulse 101, temperature 98.6 F (37 C), temperature source Oral, resp. rate 18, height 5\' 5"  (1.651 m), weight 158 lb 6.4 oz (71.85 kg), SpO2 96.00%.  Intake/Output Summary (Last 24 hours) at 08/01/14 9163 Last data filed at 07/31/14 1730  Gross per 24 hour  Intake    340 ml  Output      0 ml  Net    340 ml    Cardiovascular: RRR, no m/r/g  Respiratory: decreased breath sounds left side, + bilateral expiratory wheezing  GI: abdomen NT  MSK: 1+ bilateral edema  Neuro: no focal deficits  Psych: appropriate affect   Lab Results  Basic Metabolic Panel:  Recent Labs Lab 07/27/14 1942 07/28/14 0546 07/31/14 0352  NA  135* 134* 133*  K 3.6* 3.2* 4.3  CL 94* 94* 91*  CO2 29 30 31   GLUCOSE 115* 98 104*  BUN 22 21 21   CREATININE 0.81 0.75 1.37*  CALCIUM 7.8* 7.5* 7.7*    Liver Function Tests:  Recent Labs Lab 07/28/14 0546  AST 19  ALT 13  ALKPHOS 63  BILITOT 0.4  PROT 5.8*  ALBUMIN 2.2*    CBC:  Recent Labs Lab 07/27/14 1942  07/28/14 0546  WBC 6.9 6.3  HGB 11.1* 10.9*  HCT 34.6* 34.1*  MCV 80.1 80.2  PLT 401* 362    Cardiac Enzymes:  Recent Labs Lab 07/27/14 1942 07/28/14 1717 07/31/14 0352  TROPONINI <0.30 <0.30 <0.30    BNP: No components found with this basename: POCBNP,     Impression/Recommendations  1. LE edema - lasix 40mg  IV bid, I/Os only report fairly minimal output, unclear accuracy. Will order daily weights, weight 07/28/15 at 158 lbs.  - Cr bump from 0.75 to 1.37 since admit, will hold lasix today.  - concerning constellation of exudative left pleural effusion, hypoalbunemia, possible abdominal mass on CT scan . Echo shows normal LVEF, fairly mild diastolic dysfunction. Unclear if abdominal mass on CT could be affecting venous return, or if fluid leak due to low protein. Overall her mild diastolic dysfunction might contribute some to her LE edema but does not explain her entire picture.  - hold lasix today due to Cr bump, defer further workup of abdominal pathology to primary team, will continue to follow.   Carlyle Dolly, M.D., F.A.C.C.

## 2014-08-01 NOTE — Progress Notes (Signed)
Paracentesis complete no signs of distress. 1500 ml red colored pleural fluid removed.

## 2014-08-01 NOTE — Progress Notes (Signed)
Subjective: She says she feels a little bit better. She has less chest discomfort and less cough.  Objective: Vital signs in last 24 hours: Temp:  [97.5 F (36.4 C)-98.6 F (37 C)] 98.6 F (37 C) (08/31 0634) Pulse Rate:  [94-101] 101 (08/31 0634) Resp:  [16-20] 18 (08/31 0634) BP: (94-133)/(55-65) 133/58 mmHg (08/31 0634) SpO2:  [95 %-98 %] 96 % (08/30 2034) Weight change:  Last BM Date: 07/27/14  Intake/Output from previous day: 08/30 0701 - 08/31 0700 In: 460 [P.O.:360; IV Piggyback:100] Out: -   PHYSICAL EXAM General appearance: alert, cooperative and mild distress Resp: She has diminished breath sounds on the left and her chest is generally clearer than before Cardio: regular rate and rhythm, S1, S2 normal, no murmur, click, rub or gallop GI: She has mild abdominal distention Extremities: 1-2+ edema she has a skin lesion on her left shin  Lab Results:  Results for orders placed during the hospital encounter of 07/27/14 (from the past 48 hour(s))  BLOOD GAS, ARTERIAL     Status: Abnormal   Collection Time    07/30/14  7:00 PM      Result Value Ref Range   O2 Content 2.0     Delivery systems NASAL CANNULA     pH, Arterial 7.403  7.350 - 7.450   pCO2 arterial 52.5 (*) 35.0 - 45.0 mmHg   pO2, Arterial 84.4  80.0 - 100.0 mmHg   Bicarbonate 32.0 (*) 20.0 - 24.0 mEq/L   TCO2 29.2  0 - 100 mmol/L   Acid-Base Excess 7.3 (*) 0.0 - 2.0 mmol/L   O2 Saturation 95.8     Patient temperature 37.0     Collection site LEFT RADIAL     Drawn by [id number]     Sample type ARTERIAL     Allens test (pass/fail) PASS  PASS  BASIC METABOLIC PANEL     Status: Abnormal   Collection Time    07/31/14  3:52 AM      Result Value Ref Range   Sodium 133 (*) 137 - 147 mEq/L   Potassium 4.3  3.7 - 5.3 mEq/L   Chloride 91 (*) 96 - 112 mEq/L   CO2 31  19 - 32 mEq/L   Glucose, Bld 104 (*) 70 - 99 mg/dL   BUN 21  6 - 23 mg/dL   Creatinine, Ser 1.37 (*) 0.50 - 1.10 mg/dL   Calcium 7.7 (*) 8.4 -  10.5 mg/dL   GFR calc non Af Amer 36 (*) >90 mL/min   GFR calc Af Amer 42 (*) >90 mL/min   Comment: (NOTE)     The eGFR has been calculated using the CKD EPI equation.     This calculation has not been validated in all clinical situations.     eGFR's persistently <90 mL/min signify possible Chronic Kidney     Disease.   Anion gap 11  5 - 15  TROPONIN I     Status: None   Collection Time    07/31/14  3:52 AM      Result Value Ref Range   Troponin I <0.30  <0.30 ng/mL   Comment:            Due to the release kinetics of cTnI,     a negative result within the first hours     of the onset of symptoms does not rule out     myocardial infarction with certainty.     If myocardial infarction is still  suspected,     repeat the test at appropriate intervals.    ABGS  Recent Labs  07/30/14 1900  PHART 7.403  PO2ART 84.4  TCO2 29.2  HCO3 32.0*   CULTURES Recent Results (from the past 240 hour(s))  BODY FLUID CULTURE     Status: None   Collection Time    07/28/14 11:46 AM      Result Value Ref Range Status   Specimen Description FLUID PLEURAL LEFT   Final   Special Requests NONE   Final   Gram Stain     Final   Value: NO WBC SEEN     NO ORGANISMS SEEN     Performed at [facility]   Culture     Final   Value: NO GROWTH 3 DAYS     Performed at [facility]   Report Status 07/31/2014 FINAL   Final   Studies/Results: Dg Chest Port 1 View  07/30/2014   CLINICAL DATA:  Worsening chest pain. Prior left thoracentesis earlier this week.  EXAM: PORTABLE CHEST - 1 VIEW  COMPARISON:  02/25/2014  FINDINGS: Continued large left pleural effusion, likely slightly increased since prior study. Increasing airspace disease in the aerated left upper lobe. No pneumothorax. Cardiomegaly. No confluent opacity on the right.  IMPRESSION: Continued large left pleural effusion, likely slightly increased. Airspace disease now throughout the aerated left upper lobe.  Cardiomegaly.    Electronically Signed   By: Rolm Baptise M.D.   On: 07/30/2014 18:35    Medications:  Prior to Admission:  No prescriptions prior to admission   Scheduled: . enoxaparin (LOVENOX) injection  40 mg Subcutaneous Q24H  . furosemide  40 mg Intravenous BID  . levofloxacin (LEVAQUIN) IV  500 mg Intravenous Q24H  . potassium chloride  20 mEq Oral BID  . sodium chloride  3 mL Intravenous Q12H  . sodium chloride  3 mL Intravenous Q12H   Continuous:  EXN:TZGYFV chloride, ALPRAZolam, alum & mag hydroxide-simeth, HYDROcodone-acetaminophen, HYDROmorphone (DILAUDID) injection, ondansetron (ZOFRAN) IV, ondansetron, sodium chloride  Assesment: She was admitted with a large pleural effusion. The effusion looks exudative. She also has some ascitic fluid and peripheral edema. Echocardiogram showed normal left ventricle systolic function but no good determination of diastolic function could be made. She is being treated as diastolic heart failure. Cytology on the pleural effusion is still pending Principal Problem:   Heart failure, diastolic, with acute decompensation Active Problems:   Pleural effusion   Edema of both legs    Plan: Cardiology consultation today. Repeat thoracentesis today.    LOS: 5 days   Latrise Bowland L 08/01/2014, 8:46 AM

## 2014-08-01 NOTE — Procedures (Signed)
PreOperative Dx: LEFT pleural effusion Postoperative Dx: LEFT pleural effusion Procedure:   US guided LEFT thoracentesis Radiologist:  Thornton Papas Anesthesia:  8 ml of 1 lidocaine Specimen:  1500 ml of dark old appearing bloody fluid colored fluid EBL:   < 1 ml Complications: None

## 2014-08-02 DIAGNOSIS — C786 Secondary malignant neoplasm of retroperitoneum and peritoneum: Secondary | ICD-10-CM

## 2014-08-02 DIAGNOSIS — C569 Malignant neoplasm of unspecified ovary: Secondary | ICD-10-CM

## 2014-08-02 DIAGNOSIS — R0602 Shortness of breath: Secondary | ICD-10-CM

## 2014-08-02 DIAGNOSIS — R109 Unspecified abdominal pain: Secondary | ICD-10-CM

## 2014-08-02 LAB — BASIC METABOLIC PANEL
Anion gap: 6 (ref 5–15)
BUN: 32 mg/dL — ABNORMAL HIGH (ref 6–23)
CO2: 32 mEq/L (ref 19–32)
Calcium: 7.8 mg/dL — ABNORMAL LOW (ref 8.4–10.5)
Chloride: 95 mEq/L — ABNORMAL LOW (ref 96–112)
Creatinine, Ser: 1.09 mg/dL (ref 0.50–1.10)
GFR, EST AFRICAN AMERICAN: 55 mL/min — AB (ref >90)
GFR, EST NON AFRICAN AMERICAN: 48 mL/min — AB (ref >90)
Glucose, Bld: 91 mg/dL (ref 70–99)
POTASSIUM: 4.9 meq/L (ref 3.7–5.3)
SODIUM: 133 meq/L — AB (ref 137–147)

## 2014-08-02 LAB — CANCER ANTIGEN 19-9: CA 19-9: 10.8 U/mL — ABNORMAL LOW (ref <35.0)

## 2014-08-02 LAB — CEA: CEA: 1.6 ng/mL (ref 0.0–5.0)

## 2014-08-02 MED ORDER — LEVOFLOXACIN 500 MG PO TABS
500.0000 mg | ORAL_TABLET | Freq: Every day | ORAL | Status: DC
  Administered 2014-08-02 – 2014-08-04 (×3): 500 mg via ORAL
  Filled 2014-08-02 (×5): qty 1

## 2014-08-02 NOTE — Progress Notes (Signed)
Called report to [employer] [employer]. Gave report to [place], Therapist, sports. Patient and family aware of the transfer to room 1433 at [facility]. Waiting to give carelink report at this time then patient is ready for transfer.

## 2014-08-02 NOTE — Progress Notes (Addendum)
Patient ID: DANALEE FLATH, female   DOB: 1935-04-12, 79 y.o.   MRN: [id number]     Subjective:    SOB has improved after paracentesis  Objective:   Temp:  [98.4 F (36.9 C)-98.7 F (37.1 C)] 98.4 F (36.9 C) (09/01 0439) Pulse Rate:  [100-109] 109 (09/01 0439) Resp:  [20] 20 (09/01 0439) BP: (93-122)/(50-88) 93/70 mmHg (09/01 0439) SpO2:  [94 %-98 %] 94 % (09/01 0439) Weight:  [152 lb 5.4 oz (69.1 kg)-153 lb 7 oz (69.6 kg)] 152 lb 5.4 oz (69.1 kg) (09/01 0441) Last BM Date: 07/27/14  Filed Weights   07/27/14 2249 08/01/14 1700 08/02/14 0441  Weight: 158 lb 6.4 oz (71.85 kg) 153 lb 7 oz (69.6 kg) 152 lb 5.4 oz (69.1 kg)    Intake/Output Summary (Last 24 hours) at 08/02/14 0813 Last data filed at 08/01/14 1332  Gross per 24 hour  Intake    240 ml  Output      0 ml  Net    240 ml    Telemetry: sinus tach  Exam:  General: NAD  Resp: diminshed but improved breath sounds on left  Cardiac: RRR, no m/r/g, no JVD  QV:ZDGLOVF soft, NT, ND  MSK: 2+ bilateral LE edema  Neuro: no focal deficits  Psych: appropriate affect  Lab Results:  Basic Metabolic Panel:  Recent Labs Lab 07/28/14 0546 07/31/14 0352 08/02/14 0605  NA 134* 133* 133*  K 3.2* 4.3 4.9  CL 94* 91* 95*  CO2 30 31 32  GLUCOSE 98 104* 91  BUN 21 21 32*  CREATININE 0.75 1.37* 1.09  CALCIUM 7.5* 7.7* 7.8*    Liver Function Tests:  Recent Labs Lab 07/28/14 0546  AST 19  ALT 13  ALKPHOS 63  BILITOT 0.4  PROT 5.8*  ALBUMIN 2.2*    CBC:  Recent Labs Lab 07/27/14 1942 07/28/14 0546  WBC 6.9 6.3  HGB 11.1* 10.9*  HCT 34.6* 34.1*  MCV 80.1 80.2  PLT 401* 362    Cardiac Enzymes:  Recent Labs Lab 07/27/14 1942 07/28/14 1717 07/31/14 0352  TROPONINI <0.30 <0.30 <0.30    BNP:  Recent Labs  07/27/14 1942  PROBNP 456.7*    Coagulation:  Recent Labs Lab 07/28/14 0546  INR 1.09    ECG:   Medications:   Scheduled Medications: . enoxaparin (LOVENOX) injection  40  mg Subcutaneous Q24H  . levofloxacin (LEVAQUIN) IV  500 mg Intravenous Q24H  . pantoprazole  40 mg Oral q morning - 10a  . potassium chloride  20 mEq Oral BID  . sodium chloride  3 mL Intravenous Q12H  . sodium chloride  3 mL Intravenous Q12H     Infusions:     PRN Medications:  sodium chloride, ALPRAZolam, alum & mag hydroxide-simeth, HYDROcodone-acetaminophen, HYDROmorphone (DILAUDID) injection, ondansetron (ZOFRAN) IV, ondansetron, sodium chloride     Assessment/Plan   1. Lower extremity edema  - urine output not documented yesterday, reordered strict I/Os.  - Cr bump from 0.75 to 1.37 yesterday, diuretics held. Cr has trended back down to 1.09, BUN remains elevated. She has some sinus tach this AM potentially from intravascular depletion. I suspect she may be intravascularly depleted, but total volume overloaded. Hold diuretics today to allow her to mobilize tertiary fluid, likely restasrt oral tomorrow. - concerning constellation of exudative left pleural effusion, hypoalbunemia, possible abdominal mass on CT scan . Echo shows normal LVEF, fairly mild diastolic dysfunction. Unclear if abdominal mass on CT could be affecting venous return,  or if fluid leak due to low protein. Overall her mild diastolic dysfunction might contribute some to her LE edema but does not explain her entire picture.   2. Pleural effusion - 1.5 liters of fluid removed, bloody in color - followed by pulmonry  3. Abdominal density - described on 07/29/14 abdominal CT, possible mass cannot exclude malignancy - futher workup per primary team         Carlyle Dolly, M.D., F.A.C.C.

## 2014-08-02 NOTE — Progress Notes (Signed)
Subjective: Her cytology came back with what appears to be metastatic adenocarcinoma. I think this is probably from the abnormality in her mesentery which is very nonspecific by CT.  Objective: Vital signs in last 24 hours: Temp:  [98.4 F (36.9 C)-98.7 F (37.1 C)] 98.4 F (36.9 C) (09/01 0439) Pulse Rate:  [100-109] 109 (09/01 0439) Resp:  [20] 20 (09/01 0439) BP: (93-122)/(50-88) 93/70 mmHg (09/01 0439) SpO2:  [94 %-98 %] 94 % (09/01 0439) Weight:  [69.1 kg (152 lb 5.4 oz)-69.6 kg (153 lb 7 oz)] 69.1 kg (152 lb 5.4 oz) (09/01 0441) Weight change:  Last BM Date: 07/27/14  Intake/Output from previous day: 08/31 0701 - 09/01 0700 In: 240 [P.O.:240] Out: -   PHYSICAL EXAM General appearance: alert, cooperative and mild distress Resp: Diminished breath sounds on the left Cardio: regular rate and rhythm, S1, S2 normal, no murmur, click, rub or gallop GI: Mildly distended Extremities: 1+ edema  Lab Results:  Results for orders placed during the hospital encounter of 07/27/14 (from the past 48 hour(s))  BASIC METABOLIC PANEL     Status: Abnormal   Collection Time    08/02/14  6:05 AM      Result Value Ref Range   Sodium 133 (*) 137 - 147 mEq/L   Potassium 4.9  3.7 - 5.3 mEq/L   Chloride 95 (*) 96 - 112 mEq/L   CO2 32  19 - 32 mEq/L   Glucose, Bld 91  70 - 99 mg/dL   BUN 32 (*) 6 - 23 mg/dL   Creatinine, Ser 1.09  0.50 - 1.10 mg/dL   Calcium 7.8 (*) 8.4 - 10.5 mg/dL   GFR calc non Af Amer 48 (*) >90 mL/min   GFR calc Af Amer 55 (*) >90 mL/min   Comment: (NOTE)     The eGFR has been calculated using the CKD EPI equation.     This calculation has not been validated in all clinical situations.     eGFR's persistently <90 mL/min signify possible Chronic Kidney     Disease.   Anion gap 6  5 - 15    ABGS  Recent Labs  07/30/14 1900  PHART 7.403  PO2ART 84.4  TCO2 29.2  HCO3 32.0*   CULTURES Recent Results (from the past 240 hour(s))  BODY FLUID CULTURE      Status: None   Collection Time    07/28/14 11:46 AM      Result Value Ref Range Status   Specimen Description FLUID PLEURAL LEFT   Final   Special Requests NONE   Final   Gram Stain     Final   Value: NO WBC SEEN     NO ORGANISMS SEEN     Performed at [facility]   Culture     Final   Value: NO GROWTH 3 DAYS     Performed at [facility]   Report Status 07/31/2014 FINAL   Final   Studies/Results: Dg Chest 1 View  08/01/2014   CLINICAL DATA:  Post LEFT thoracentesis  EXAM: CHEST - 1 VIEW  COMPARISON:  07/30/2014  FINDINGS: Decrease in LEFT pleural effusion post thoracentesis.  Moderate persistent LEFT pleural effusion and LEFT basilar atelectasis remain.  Heart appears enlarged.  Minimal RIGHT base atelectasis.  No pneumothorax following thoracentesis.  IMPRESSION: Decrease in LEFT pleural effusion and atelectasis post thoracentesis without evidence of postprocedural pneumothorax.  Enlargement of cardiac silhouette with minimal RIGHT basilar atelectasis.   Electronically Signed  By: Lavonia Dana M.D.   On: 08/01/2014 12:53   US Thoracentesis Asp Pleural Space W/img Guide  08/01/2014   CLINICAL DATA:  Persistent LEFT pleural effusion  EXAM: US THORACENTESIS ASP PLEURAL SPACE W/IMG GUIDE  TECHNIQUE: Procedure, benefits, and risks of procedure were discussed with patient.  Written informed consent for procedure was obtained.  Time out protocol followed.  Pleural effusion localized by ultrasound at the posterior LEFT hemithorax.  Skin prepped and draped in usual sterile fashion.  Skin and soft tissues anesthetized with 8 mL of 1% lidocaine.  8 French thoracentesis catheter placed into the LEFT pleural space.  1500 mL of dark old appearing bloody fluid aspirated by syringe pump.  Procedure tolerated well by patient without immediate complication.  Fluid sent to laboratory for requested analysis.  COMPARISON:  07/28/2014  FINDINGS: As above  IMPRESSION: Ultrasound-guided thoracentesis  of the LEFT pleural cavity with removal of 1500 mL of dark old appearing bloody fluid.   Electronically Signed   By: Lavonia Dana M.D.   On: 08/01/2014 12:49    Medications:  Prior to Admission:  No prescriptions prior to admission   Scheduled: . enoxaparin (LOVENOX) injection  40 mg Subcutaneous Q24H  . levofloxacin (LEVAQUIN) IV  500 mg Intravenous Q24H  . pantoprazole  40 mg Oral q morning - 10a  . potassium chloride  20 mEq Oral BID  . sodium chloride  3 mL Intravenous Q12H  . sodium chloride  3 mL Intravenous Q12H   Continuous:  XQK:SKSHNG chloride, ALPRAZolam, alum & mag hydroxide-simeth, HYDROcodone-acetaminophen, HYDROmorphone (DILAUDID) injection, ondansetron (ZOFRAN) IV, ondansetron, sodium chloride  Assesment: She has a pleural effusion which was exudative and appears to be related to metastatic adenocarcinoma. I think this is probably from a mesenteric abnormality. Principal Problem:   Heart failure, diastolic, with acute decompensation Active Problems:   Pleural effusion   Edema of both legs    Plan: Oncology consultation. She might be a candidate for pleural catheter placement to help manage the malignant pleural effusion    LOS: 6 days   Kahmari Koller L 08/02/2014, 8:40 AM

## 2014-08-02 NOTE — Progress Notes (Signed)
PHARMACIST - PHYSICIAN COMMUNICATION DR:   Hawkins CONCERNING: Antibiotic IV to Oral Route Change Policy  RECOMMENDATION: This patient is receiving Levaquin by the intravenous route.  Based on criteria approved by the Pharmacy and Therapeutics Committee, the antibiotic(s) is/are being converted to the equivalent oral dose form(s).  DESCRIPTION: These criteria include:  Patient being treated for a respiratory tract infection, urinary tract infection, cellulitis or clostridium difficile associated diarrhea if on metronidazole  The patient is not neutropenic and does not exhibit a GI malabsorption state  The patient is eating (either orally or via tube) and/or has been taking other orally administered medications for a least 24 hours  The patient is improving clinically and has a Tmax < 100.5  If you have questions about this conversion, please contact the Pharmacy Department  [x]  ( [phone] )  [place] []  ( [phone]  [place]  []  ( [phone] )  [facility] []  ( [phone] )   [facility]   S. Jalik Gellatly, PharmD  

## 2014-08-02 NOTE — Progress Notes (Signed)
[name] [phone]   Fax:([phone]) [facility] CONSULT NOTE  REFERRING PHYSICIAN: Sinda Du, M.D.  REASON FOR CONSULTATION:  Metastatic adenocarcinoma with malignant pleural effusion.  HPI [name] is a 79 y.o. female.  Referred because of metastatic adenocarcinoma found in a malignant pleural effusion. About 3 weeks ago the patient had been complaining of shortness of breath with peripheral edema and was started on compression stockings plus diuretics. 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 Because of increasing shortness of breath or abdominal discomfort she went to the emergency room and was admitted. A large left pleural effusion was found and has since been drained on 2 occasions, 07/28/2014 and 08/01/2014 with removal of 1500 mL on each occasion. Fluid was dark-colored with positive cytology for adenocarcinoma. The patient is somewhat less short of breath currently with oxygen in place. She's had poor appetite with easy satiety and abdominal swelling as well. Bilateral lower extremity edema has not improved. She denies any PND, orthopnea, or chest pain. He also denies any fever, night sweats, vaginal bleeding or discharge, melena, hematochezia, hematuria, epistaxis, or hemoptysis. HPI  History reviewed. No pertinent past medical history.  Past Surgical History  Procedure Laterality Date  . Cholecystectomy    . Cardiac catheterization      History reviewed. No pertinent family history.  Social History History  Substance Use Topics  . Smoking status: Never Smoker   . Smokeless tobacco: Not on file  . Alcohol Use: No    Allergies  Allergen Reactions  . Penicillins     Current Facility-Administered Medications  Medication Dose Route Frequency Provider Last Rate Last Dose  . 0.9 %  sodium chloride infusion  250 mL Intravenous PRN Oswald Hillock, MD      . ALPRAZolam Duanne Moron) tablet 0.5 mg  0.5 mg  Oral QHS PRN Jeryl Columbia, NP   0.5 mg at 08/01/14 2110  . alum & mag hydroxide-simeth (MAALOX/MYLANTA) 200-200-20 MG/5ML suspension 30 mL  30 mL Oral Q2H PRN [name], MD   30 mL at 07/30/14 0928  . enoxaparin (LOVENOX) injection 40 mg  40 mg Subcutaneous Q24H Oswald Hillock, MD   40 mg at 08/01/14 2217  . HYDROcodone-acetaminophen (NORCO/VICODIN) 5-325 MG per tablet 1-2 tablet  1-2 tablet Oral Q4H PRN Oswald Hillock, MD   1 tablet at 08/02/14 1129  . HYDROmorphone (DILAUDID) injection 1 mg  1 mg Intravenous Q4H PRN Lanette Hampshire, MD   1 mg at 08/01/14 1354  . levofloxacin (LEVAQUIN) IVPB 500 mg  500 mg Intravenous Q24H [name], MD   500 mg at 08/01/14 1736  . ondansetron (ZOFRAN) tablet 4 mg  4 mg Oral Q6H PRN Oswald Hillock, MD   4 mg at 07/28/14 1415   Or  . ondansetron (ZOFRAN) injection 4 mg  4 mg Intravenous Q6H PRN Oswald Hillock, MD      . pantoprazole (PROTONIX) EC tablet 40 mg  40 mg Oral q morning - 10a [name], MD   40 mg at 08/02/14 1129  . potassium chloride SA (K-DUR,KLOR-CON) CR tablet 20 mEq  20 mEq Oral BID [name], MD   20 mEq at 08/02/14 1128  . sodium chloride 0.9 % injection 3 mL  3 mL Intravenous Q12H Oswald Hillock, MD   3 mL at 08/01/14 2107  . sodium chloride 0.9 % injection 3 mL  3  mL Intravenous Q12H Oswald Hillock, MD   3 mL at 08/02/14 1133  . sodium chloride 0.9 % injection 3 mL  3 mL Intravenous PRN Oswald Hillock, MD   3 mL at 07/31/14 1816    @GFROS @    All other systems are negative.    Physical Exam: Blood pressure 93/70, pulse 109, temperature 98.4 F (36.9 C), temperature source Oral, resp. rate 20, height 5' 5"  (1.651 m), weight 152 lb 5.4 oz (69.1 kg), SpO2 96.00%.  GENERAL:alert, no distress and comfortable SKIN: skin color, texture, turgor are normal, no rashes or significant lesions EYES: normal, Conjunctiva are pink and non-injected, sclera clear OROPHARYNX:no exudate, no erythema and lips, buccal mucosa, and  tongue normal  NECK: supple, thyroid normal size, non-tender, without nodularity CHEST: Decreased breath sounds bilaterally, left greater than right with dullness to percussion. LYMPH:  no palpable lymphadenopathy in the cervical, axillary or inguinal LUNGS: clear to auscultation and percussion with normal breathing effort HEART: regular rate & rhythm and no murmurs ABDOMEN:abdomen soft, non-tender and normal bowel sounds. Distended with positive fluid wave and shifting dullness. Midepigastric tenderness. No CVA tenderness. Liver and spleen not palpable. MUSCULOSKELETALl:no cyanosis of digits, bilateral posterior pitting edema of both lower extremities. No clubbing.  NEURO: alert & oriented x 3 with fluent speech, no focal motor/sensory deficits    PERFORMANCE STATUS: ECOG =2  LABORATORY DATA: Lab Results  Component Value Date   WBC 6.3 07/28/2014   HGB 10.9* 07/28/2014   HCT 34.1* 07/28/2014   MCV 80.2 07/28/2014   PLT 362 07/28/2014    Admission on 07/27/2014  Component Date Value Ref Range Status  . WBC 07/27/2014 6.9  4.0 - 10.5 K/uL Final  . RBC 07/27/2014 4.32  3.87 - 5.11 MIL/uL Final  . Hemoglobin 07/27/2014 11.1* 12.0 - 15.0 g/dL Final  . HCT 07/27/2014 34.6* 36.0 - 46.0 % Final  . MCV 07/27/2014 80.1  78.0 - 100.0 fL Final  . MCH 07/27/2014 25.7* 26.0 - 34.0 pg Final  . MCHC 07/27/2014 32.1  30.0 - 36.0 g/dL Final  . RDW 07/27/2014 17.2* 11.5 - 15.5 % Final  . Platelets 07/27/2014 401* 150 - 400 K/uL Final  . Sodium 07/27/2014 135* 137 - 147 mEq/L Final  . Potassium 07/27/2014 3.6* 3.7 - 5.3 mEq/L Final  . Chloride 07/27/2014 94* 96 - 112 mEq/L Final  . CO2 07/27/2014 29  19 - 32 mEq/L Final  . Glucose, Bld 07/27/2014 115* 70 - 99 mg/dL Final  . BUN 07/27/2014 22  6 - 23 mg/dL Final  . Creatinine, Ser 07/27/2014 0.81  0.50 - 1.10 mg/dL Final  . Calcium 07/27/2014 7.8* 8.4 - 10.5 mg/dL Final  . GFR calc non Af Amer 07/27/2014 68* >90 mL/min Final  . GFR calc Af Amer  07/27/2014 79* >90 mL/min Final   Comment: (NOTE)                          The eGFR has been calculated using the CKD EPI equation.                          This calculation has not been validated in all clinical situations.                          eGFR's persistently <90 mL/min signify possible Chronic Kidney  Disease.  . Anion gap 07/27/2014 12  5 - 15 Final  . Pro B Natriuretic peptide (BNP) 07/27/2014 456.7* 0 - 450 pg/mL Final  . Troponin I 07/27/2014 <0.30  <0.30 ng/mL Final   Comment:                                 Due to the release kinetics of cTnI,                          a negative result within the first hours                          of the onset of symptoms does not rule out                          myocardial infarction with certainty.                          If myocardial infarction is still suspected,                          repeat the test at appropriate intervals.  . Total protein, fluid 07/28/2014 3.6   Final   NO NORMAL RANGE ESTABLISHED FOR THIS TEST  . Fluid Type-FTP 07/28/2014 Pleural, L   Final  . LDH 07/27/2014 219  94 - 250 U/L Final  . LD, Fluid 07/28/2014 182* 3 - 23 U/L Final  . Fluid Type-FLDH 07/28/2014 Pleural, L   Final  . Fluid Type-FCT 07/28/2014 PLEURAL   Corrected   Comment: LEFT                          CORRECTED ON 08/27 AT 1323: PREVIOUSLY REPORTED AS Body Fluid  . Color, Fluid 07/28/2014 RED* YELLOW Final  . Appearance, Fluid 07/28/2014 CLOUDY* CLEAR Final  . WBC, Fluid 07/28/2014 834  0 - 1000 cu mm Final  . Neutrophil Count, Fluid 07/28/2014 30* 0 - 25 % Final  . Lymphs, Fluid 07/28/2014 50   Final  . Monocyte-Macrophage-Serous Fluid 07/28/2014 19* 50 - 90 % Final  . Eos, Fluid 07/28/2014 1   Final  . Other Cells, Fluid 07/28/2014 FEW   Final   Comment: OTHER CELLS IDENTIFIED AS MESOTHELIAL CELLS                          WITH SOME REACTIVE/ATYPICAL CELLS SEEN.                          PENDING PATHOLOGIST  REVIEW  . Glucose, Fluid 07/28/2014 67   Final   Comment:                                 FLUID GLUCOSE LEVELS OF <60                          mg/dL OR VALUES OF 40 mg/dL                          LESS THAN A  SIMULTANEOUS                          SERUM LEVEL ARE CONSIDERED                          DECREASED.  Marland Kitchen Fluid Type-FGLU 07/28/2014 PLEURAL   Corrected   Comment: LEFT                          CORRECTED ON 08/27 AT 1235: PREVIOUSLY REPORTED AS Pleural, L  . Amylase, Pleural Fluid 07/28/2014 278   Final   Comment: (NOTE)                          Reference range:                          Values are considered abnormal if they are greater than or equal to                          two times a simultaneously analyzed serum value.                          Performed at [facility]  . Vitamin B-12 07/27/2014 429  211 - 911 pg/mL Final   Performed at [facility]  . Folate 07/27/2014 4.5   Final   Comment: (NOTE)                          Reference Ranges                                 Deficient:       0.4 - 3.3 ng/mL                                 Indeterminate:   3.4 - 5.4 ng/mL                                 Normal:              > 5.4 ng/mL                          Performed at [facility]  . Iron 07/27/2014 17* 42 - 135 ug/dL Final  . TIBC 07/27/2014 209* 250 - 470 ug/dL Final  . Saturation Ratios 07/27/2014 8* 20 - 55 % Final  . UIBC 07/27/2014 192  125 - 400 ug/dL Final   Performed at [facility]  . Ferritin 07/27/2014 166  10 - 291 ng/mL Final   Performed at [facility]  . Retic Ct Pct 07/27/2014 1.7  0.4 - 3.1 % Final  . RBC. 07/27/2014 4.29  3.87 - 5.11 MIL/uL Final  . Retic Count, Manual 07/27/2014 72.9  19.0 - 186.0 K/uL Final  . WBC 07/28/2014 6.3  4.0 - 10.5 K/uL Final  . RBC 07/28/2014 4.25  3.87 - 5.11 MIL/uL Final  . Hemoglobin 07/28/2014 10.9* 12.0 - 15.0 g/dL Final  .  HCT 07/28/2014 34.1* 36.0 - 46.0 % Final  . MCV  07/28/2014 80.2  78.0 - 100.0 fL Final  . MCH 07/28/2014 25.6* 26.0 - 34.0 pg Final  . MCHC 07/28/2014 32.0  30.0 - 36.0 g/dL Final  . RDW 07/28/2014 17.2* 11.5 - 15.5 % Final  . Platelets 07/28/2014 362  150 - 400 K/uL Final  . Prothrombin Time 07/28/2014 14.1  11.6 - 15.2 seconds Final  . INR 07/28/2014 1.09  0.00 - 1.49 Final  . Sodium 07/28/2014 134* 137 - 147 mEq/L Final  . Potassium 07/28/2014 3.2* 3.7 - 5.3 mEq/L Final  . Chloride 07/28/2014 94* 96 - 112 mEq/L Final  . CO2 07/28/2014 30  19 - 32 mEq/L Final  . Glucose, Bld 07/28/2014 98  70 - 99 mg/dL Final  . BUN 07/28/2014 21  6 - 23 mg/dL Final  . Creatinine, Ser 07/28/2014 0.75  0.50 - 1.10 mg/dL Final  . Calcium 07/28/2014 7.5* 8.4 - 10.5 mg/dL Final  . Total Protein 07/28/2014 5.8* 6.0 - 8.3 g/dL Final  . Albumin 07/28/2014 2.2* 3.5 - 5.2 g/dL Final  . AST 07/28/2014 19  0 - 37 U/L Final  . ALT 07/28/2014 13  0 - 35 U/L Final  . Alkaline Phosphatase 07/28/2014 63  39 - 117 U/L Final  . Total Bilirubin 07/28/2014 0.4  0.3 - 1.2 mg/dL Final  . GFR calc non Af Amer 07/28/2014 80* >90 mL/min Final  . GFR calc Af Amer 07/28/2014 >90  >90 mL/min Final   Comment: (NOTE)                          The eGFR has been calculated using the CKD EPI equation.                          This calculation has not been validated in all clinical situations.                          eGFR's persistently <90 mL/min signify possible Chronic Kidney                          Disease.  . Anion gap 07/28/2014 10  5 - 15 Final  . Specimen Description 07/28/2014 FLUID PLEURAL LEFT   Final  . Special Requests 07/28/2014 NONE   Final  . Gram Stain 07/28/2014    Final                   Value:NO WBC SEEN                         NO ORGANISMS SEEN                         Performed at [facility]  . Culture 07/28/2014    Final                   Value:NO GROWTH 3 DAYS                         Performed at [facility]  . Report Status  07/28/2014 07/31/2014 FINAL   Final  . Troponin I 07/28/2014 <0.30  <0.30 ng/mL Final   Comment:  Due to the release kinetics of cTnI,                          a negative result within the first hours                          of the onset of symptoms does not rule out                          myocardial infarction with certainty.                          If myocardial infarction is still suspected,                          repeat the test at appropriate intervals.  . Sodium 07/31/2014 133* 137 - 147 mEq/L Final  . Potassium 07/31/2014 4.3  3.7 - 5.3 mEq/L Final  . Chloride 07/31/2014 91* 96 - 112 mEq/L Final  . CO2 07/31/2014 31  19 - 32 mEq/L Final  . Glucose, Bld 07/31/2014 104* 70 - 99 mg/dL Final  . BUN 07/31/2014 21  6 - 23 mg/dL Final  . Creatinine, Ser 07/31/2014 1.37* 0.50 - 1.10 mg/dL Final  . Calcium 07/31/2014 7.7* 8.4 - 10.5 mg/dL Final  . GFR calc non Af Amer 07/31/2014 36* >90 mL/min Final  . GFR calc Af Amer 07/31/2014 42* >90 mL/min Final   Comment: (NOTE)                          The eGFR has been calculated using the CKD EPI equation.                          This calculation has not been validated in all clinical situations.                          eGFR's persistently <90 mL/min signify possible Chronic Kidney                          Disease.  . Anion gap 07/31/2014 11  5 - 15 Final  . O2 Content 07/30/2014 2.0   Final  . Delivery systems 07/30/2014 NASAL CANNULA   Final  . pH, Arterial 07/30/2014 7.403  7.350 - 7.450 Final  . pCO2 arterial 07/30/2014 52.5* 35.0 - 45.0 mmHg Final  . pO2, Arterial 07/30/2014 84.4  80.0 - 100.0 mmHg Final  . Bicarbonate 07/30/2014 32.0* 20.0 - 24.0 mEq/L Final  . TCO2 07/30/2014 29.2  0 - 100 mmol/L Final  . Acid-Base Excess 07/30/2014 7.3* 0.0 - 2.0 mmol/L Final  . O2 Saturation 07/30/2014 95.8   Final  . Patient temperature 07/30/2014 37.0   Final  . Collection site 07/30/2014 LEFT RADIAL   Final    . Drawn by 07/30/2014 [id number]   Final  . Sample type 07/30/2014 ARTERIAL   Final  . Allens test (pass/fail) 07/30/2014 PASS  PASS Final  . Troponin I 07/31/2014 <0.30  <0.30 ng/mL Final   Comment:  Due to the release kinetics of cTnI,                          a negative result within the first hours                          of the onset of symptoms does not rule out                          myocardial infarction with certainty.                          If myocardial infarction is still suspected,                          repeat the test at appropriate intervals.  . Sodium 08/02/2014 133* 137 - 147 mEq/L Final  . Potassium 08/02/2014 4.9  3.7 - 5.3 mEq/L Final  . Chloride 08/02/2014 95* 96 - 112 mEq/L Final  . CO2 08/02/2014 32  19 - 32 mEq/L Final  . Glucose, Bld 08/02/2014 91  70 - 99 mg/dL Final  . BUN 08/02/2014 32* 6 - 23 mg/dL Final  . Creatinine, Ser 08/02/2014 1.09  0.50 - 1.10 mg/dL Final  . Calcium 08/02/2014 7.8* 8.4 - 10.5 mg/dL Final  . GFR calc non Af Amer 08/02/2014 48* >90 mL/min Final  . GFR calc Af Amer 08/02/2014 55* >90 mL/min Final   Comment: (NOTE)                          The eGFR has been calculated using the CKD EPI equation.                          This calculation has not been validated in all clinical situations.                          eGFR's persistently <90 mL/min signify possible Chronic Kidney                          Disease.  . Anion gap 08/02/2014 6  5 - 15 Final    RADIOGRAPHIC STUDIES: Dg Chest 1 View  08/01/2014   CLINICAL DATA:  Post LEFT thoracentesis  EXAM: CHEST - 1 VIEW  COMPARISON:  07/30/2014  FINDINGS: Decrease in LEFT pleural effusion post thoracentesis.  Moderate persistent LEFT pleural effusion and LEFT basilar atelectasis remain.  Heart appears enlarged.  Minimal RIGHT base atelectasis.  No pneumothorax following thoracentesis.  IMPRESSION: Decrease in LEFT pleural effusion and atelectasis post  thoracentesis without evidence of postprocedural pneumothorax.  Enlargement of cardiac silhouette with minimal RIGHT basilar atelectasis.   Electronically Signed   By: Lavonia Dana M.D.   On: 08/01/2014 12:53   Dg Chest 1 View  07/28/2014   CLINICAL DATA:  LEFT pleural effusion post thoracentesis  EXAM: CHEST - 1 VIEW  COMPARISON:  07/27/2014  FINDINGS: Persistent large LEFT pleural effusion despite interval removal of 1.5 L of fluid from the LEFT chest.  No pneumothorax.  Heart remains enlarged.  Minimal RIGHT basilar atelectasis.  Bones demineralized.  IMPRESSION: Persistent large LEFT pleural effusion and basilar opacification despite removal 1.5 L of  fluid from the LEFT chest.  No pneumothorax post thoracentesis.   Electronically Signed   By: Lavonia Dana M.D.   On: 07/28/2014 12:01   Ct Chest W Contrast  07/29/2014   CLINICAL DATA:  79 year old female with shortness of breath, chest, abdominal and pelvic pain and pleural effusion.  EXAM: CT CHEST, ABDOMEN, AND PELVIS WITH CONTRAST  TECHNIQUE: Multidetector CT imaging of the chest, abdomen and pelvis was performed following the standard protocol during bolus administration of intravenous contrast.  CONTRAST:  164m OMNIPAQUE IOHEXOL 300 MG/ML  SOLN  COMPARISON:  None.  FINDINGS: CT CHEST FINDINGS  The heart and great vessels are within normal limits.  A large left pleural effusion and very small right pleural effusion are noted with bilateral lower lung atelectasis, left greater than right.  A tiny pleural pericardial effusion is noted.  There is no evidence of pleural mass or definite thickening.  There is no evidence of airspace disease, definite mass, nodule, or endobronchial/ endotracheal lesion.  No enlarged lymph nodes identified.  Mild subcutaneous edema is present.  No acute or suspicious bony abnormalities are noted.  CT ABDOMEN AND PELVIS FINDINGS  Moderate ascites is present.  Medial left/ anterior right liver scarring is noted. No focal hepatic  abnormalities are otherwise identified.  Spleen, pancreas, adrenal glands and kidneys are unremarkable except for bilateral renal cortical atrophy. Patient is status post cholecystectomy.  There is no evidence of biliary dilatation, enlarged lymph nodes or abdominal aortic aneurysm.  Extensive descending and sigmoid colonic diverticulosis noted without definite diverticulitis.  There is no evidence of bowel obstruction, peritoneal thickening or nodularity or pneumoperitoneum.  Ill-defined increased density measuring 3 x 8 cm within the mesenteric is nonspecific.  No acute or suspicious bony abnormalities are identified.  IMPRESSION: Large left pleural effusion, moderate ascites, diffuse subcutaneous edema and small right pleural effusion. Associated bilateral lower lung atelectasis.  3 x 8 cm ill-defined density within the mesentery - solid mass/neoplasm is not excluded. Consider PET-CT for further evaluation as clinically indicated.  Bilateral renal cortical atrophy and colonic diverticulosis.   Electronically Signed   By: JHassan RowanM.D.   On: 07/29/2014 12:07   Ct Abdomen Pelvis W Contrast  07/29/2014   CLINICAL DATA:  79year old female with shortness of breath, chest, abdominal and pelvic pain and pleural effusion.  EXAM: CT CHEST, ABDOMEN, AND PELVIS WITH CONTRAST  TECHNIQUE: Multidetector CT imaging of the chest, abdomen and pelvis was performed following the standard protocol during bolus administration of intravenous contrast.  CONTRAST:  1036mOMNIPAQUE IOHEXOL 300 MG/ML  SOLN  COMPARISON:  None.  FINDINGS: CT CHEST FINDINGS  The heart and great vessels are within normal limits.  A large left pleural effusion and very small right pleural effusion are noted with bilateral lower lung atelectasis, left greater than right.  A tiny pleural pericardial effusion is noted.  There is no evidence of pleural mass or definite thickening.  There is no evidence of airspace disease, definite mass, nodule, or  endobronchial/ endotracheal lesion.  No enlarged lymph nodes identified.  Mild subcutaneous edema is present.  No acute or suspicious bony abnormalities are noted.  CT ABDOMEN AND PELVIS FINDINGS  Moderate ascites is present.  Medial left/ anterior right liver scarring is noted. No focal hepatic abnormalities are otherwise identified.  Spleen, pancreas, adrenal glands and kidneys are unremarkable except for bilateral renal cortical atrophy. Patient is status post cholecystectomy.  There is no evidence of biliary dilatation, enlarged lymph nodes or abdominal  aortic aneurysm.  Extensive descending and sigmoid colonic diverticulosis noted without definite diverticulitis.  There is no evidence of bowel obstruction, peritoneal thickening or nodularity or pneumoperitoneum.  Ill-defined increased density measuring 3 x 8 cm within the mesenteric is nonspecific.  No acute or suspicious bony abnormalities are identified.  IMPRESSION: Large left pleural effusion, moderate ascites, diffuse subcutaneous edema and small right pleural effusion. Associated bilateral lower lung atelectasis.  3 x 8 cm ill-defined density within the mesentery - solid mass/neoplasm is not excluded. Consider PET-CT for further evaluation as clinically indicated.  Bilateral renal cortical atrophy and colonic diverticulosis.   Electronically Signed   By: Hassan Rowan M.D.   On: 07/29/2014 12:07   Dg Chest Port 1 View  07/30/2014   CLINICAL DATA:  Worsening chest pain. Prior left thoracentesis earlier this week.  EXAM: PORTABLE CHEST - 1 VIEW  COMPARISON:  02/25/2014  FINDINGS: Continued large left pleural effusion, likely slightly increased since prior study. Increasing airspace disease in the aerated left upper lobe. No pneumothorax. Cardiomegaly. No confluent opacity on the right.  IMPRESSION: Continued large left pleural effusion, likely slightly increased. Airspace disease now throughout the aerated left upper lobe.  Cardiomegaly.   Electronically  Signed   By: Rolm Baptise M.D.   On: 07/30/2014 18:35   Dg Chest Port 1 View  07/27/2014   CLINICAL DATA:  Shortness of breath tonight.  EXAM: PORTABLE CHEST - 1 VIEW  COMPARISON:  11/05/2012.  FINDINGS: Interval large left pleural effusion. Mild adjacent left lung atelectasis. The right lung is clear. The heart size is difficult to assess due to the obscuration of the left heart borders. There is some increased mediastinal shift to the right. Minimal right pleural effusion. Diffuse osteopenia.  IMPRESSION: 1. Interval large left pleural effusion with mild adjacent left lung atelectasis. There is associated increased mediastinal shift to the right. 2. Minimal right pleural effusion.   Electronically Signed   By: Enrique Sack M.D.   On: 07/27/2014 19:36   US Thoracentesis Asp Pleural Space W/img Guide  08/01/2014   CLINICAL DATA:  Persistent LEFT pleural effusion  EXAM: US THORACENTESIS ASP PLEURAL SPACE W/IMG GUIDE  TECHNIQUE: Procedure, benefits, and risks of procedure were discussed with patient.  Written informed consent for procedure was obtained.  Time out protocol followed.  Pleural effusion localized by ultrasound at the posterior LEFT hemithorax.  Skin prepped and draped in usual sterile fashion.  Skin and soft tissues anesthetized with 8 mL of 1% lidocaine.  8 French thoracentesis catheter placed into the LEFT pleural space.  1500 mL of dark old appearing bloody fluid aspirated by syringe pump.  Procedure tolerated well by patient without immediate complication.  Fluid sent to laboratory for requested analysis.  COMPARISON:  07/28/2014  FINDINGS: As above  IMPRESSION: Ultrasound-guided thoracentesis of the LEFT pleural cavity with removal of 1500 mL of dark old appearing bloody fluid.   Electronically Signed   By: Lavonia Dana M.D.   On: 08/01/2014 12:49   US Thoracentesis Asp Pleural Space W/img Guide  07/28/2014   CLINICAL DATA:  Large LEFT pleural effusion  EXAM: US THORACENTESIS ASP PLEURAL SPACE  W/IMG GUIDE  TECHNIQUE: Procedure, benefits, and risks of procedure were discussed with patient.  Written informed consent for procedure was obtained.  Time out protocol followed.  Pleural effusion localized by ultrasound at the posterior LEFT hemithorax.  Skin prepped and draped in usual sterile fashion.  Skin and soft tissues anesthetized with 8 mL of 1%  lidocaine.  8 French thoracentesis catheter placed into the LEFT pleural space.  1500 mL of serosanguineous fluid aspirated by syringe pump.  Procedure tolerated well by patient without immediate complication.  Fluid sample of 180 mL was sent to laboratory for requested analysis.  COMPARISON:  None ; correlation chest radiograph 07/27/2014  FINDINGS: As above  IMPRESSION: Removal of 1500 mL of serosanguineous fluid from the LEFT pleural space by ultrasound guided thoracentesis.   Electronically Signed   By: Lavonia Dana M.D.   On: 07/28/2014 12:46    PATHOLOGY:  FINAL for [name], [name] ([id number]) Patient: [name], [name] Collected: 07/28/2014 Client: [facility] Accession: [id number] Received: 07/29/2014 Lavonia Dana DOB: 1935/09/23 Age: 72 Gender: F Reported: 08/01/2014 [address] Patient Ph: [phone] MRN#: [id number] [name] [id number] Client Acc#: Chart: Phone: [phone] Fax: LMP: Visit#: [id number] CC: Eleonore Chiquito, MD Sinda Du CYTOPATHOLOGY REPORT ADDITIONAL INFORMATION: The malignant cells show the following immunoprofile: CK7: positive CK20: negative WT-1: positive ER: weakly positive PR-negative TTF-1: negative GCDFP-15: negative Controls are appropriate. The findings are diagnostic for metastatic adenocarcinoma and favor gynecologic/ovarian primary. Clinical and radiographic correlation is recommended. Aldona Bar MD Pathologist, Electronic Signature ( Signed 08/02/2014) Adequacy Reason Satisfactory For Evaluation. Diagnosis PLEURAL FLUID, LEFT(SPECIMEN 1 OF 1 COLLECTED 07/28/14): MALIGNANT  CELLS CONSISTENT WITH METASTATIC ADENOCARCINOMA. PLEASE SEE COMMENT. Aldona Bar MD Pathologist, Electronic Signature (Case signed 08/01/2014) Source Pleural Fluid, Left, (specimen 1 of 1 collected 1/79/15 1 of 2 Duplicate copy FINAL for [name], [name] ([id number]) Gross Specimen: Received is/are 105cc's of red fluid. (BS:bs) Prepared: # Smears: 0 # Concentration Technique Slides (i.e. ThinPrep): 1 # Cell Block: 1 Additional Studies: Also received Hematology slides labeled [id number]. Comment Further immunostains will be performed and an addendum will follow. Warning Message Some of these immunohistochemical stains may have been developed and the performance characteristics determined by [facility]. Some may not have been cleared or approved by the U.S. Food and Drug Administration. The FDA has determined that such clearance or approval is not necessary. This test is used for clinical purposes. It should not be regarded as investigational or for research. This laboratory is certified under the [place] (CLIA-88) as qualified to perform high complexity clinical laboratory testing. Report signed out from the following location(s) Technical component and interpretation was performed at [place] [address], [place], [place] [address]. CLIA #:    ASSESSMENT: #1. 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  #2. History of adenomatous polyps in November of 2010. #3. Status post hysterectomy and bilateral salpingo-oophorectomy. #4. Status post cholecystectomy.   PLAN: #1. Recommend thoracic surgery consultation with VATS and talc pleurodesis to prevent  recurrence of left pleural fluid. This technique is successful in about 80% of cases. #2. Therapeutic paracentesis to permit better alimentation. #3. GYN oncology consult. This may best be accomplished in toto by transferring the patient to [place] to allow the patient to avail herself of the above-mentioned expertise. #4. Based upon her performance status following pleurodesis and paracentesis, the patient may be a candidate for neoadjuvant systemic treatment prior to definitive surgery. Tissue has been sent for [facility] molecular analysis to help determine optimal intervention utilizing either chemotherapy or novel  therapy such as anti-VEGR oral agents. #5. If improvement in performance status is not noted after pleurodesis and paracentesis, hospice care may be the most logical next step. #6. No need to do PET scan.   I appreciate the opportunity of sharing in her care and have shared all of the above assessments and plans with the family in attendance.  The patient voices understanding of current disease status and treatment options.   All questions were answered. Thank you so much for allowing me to participate in the care of [name].    I spent 50 minutes counseling the patient face to face. The total time spent in the appointment was 55 minutes.  Farrel Gobble A 08/02/2014, 12:08 PM  Voice recognition software has been used in the generation of this note.  Inadvertent errors may occur not picked up on review.

## 2014-08-03 ENCOUNTER — Encounter (HOSPITAL_COMMUNITY): Payer: Self-pay | Admitting: Thoracic Surgery (Cardiothoracic Vascular Surgery)

## 2014-08-03 ENCOUNTER — Inpatient Hospital Stay (HOSPITAL_COMMUNITY): Payer: Medicare HMO

## 2014-08-03 ENCOUNTER — Telehealth: Payer: Self-pay | Admitting: Gynecologic Oncology

## 2014-08-03 DIAGNOSIS — R634 Abnormal weight loss: Secondary | ICD-10-CM

## 2014-08-03 DIAGNOSIS — D649 Anemia, unspecified: Secondary | ICD-10-CM | POA: Diagnosis present

## 2014-08-03 DIAGNOSIS — J910 Malignant pleural effusion: Principal | ICD-10-CM

## 2014-08-03 DIAGNOSIS — C801 Malignant (primary) neoplasm, unspecified: Secondary | ICD-10-CM

## 2014-08-03 DIAGNOSIS — E875 Hyperkalemia: Secondary | ICD-10-CM | POA: Diagnosis not present

## 2014-08-03 LAB — COMPREHENSIVE METABOLIC PANEL
ALBUMIN: 2 g/dL — AB (ref 3.5–5.2)
ALT: 11 U/L (ref 0–35)
AST: 19 U/L (ref 0–37)
Alkaline Phosphatase: 61 U/L (ref 39–117)
Anion gap: 9 (ref 5–15)
BUN: 28 mg/dL — ABNORMAL HIGH (ref 6–23)
CALCIUM: 8.3 mg/dL — AB (ref 8.4–10.5)
CO2: 30 mEq/L (ref 19–32)
Chloride: 92 mEq/L — ABNORMAL LOW (ref 96–112)
Creatinine, Ser: 0.84 mg/dL (ref 0.50–1.10)
GFR calc Af Amer: 76 mL/min — ABNORMAL LOW (ref >90)
GFR, EST NON AFRICAN AMERICAN: 65 mL/min — AB (ref >90)
Glucose, Bld: 105 mg/dL — ABNORMAL HIGH (ref 70–99)
Potassium: 5.7 mEq/L — ABNORMAL HIGH (ref 3.7–5.3)
Sodium: 131 mEq/L — ABNORMAL LOW (ref 137–147)
Total Bilirubin: 0.3 mg/dL (ref 0.3–1.2)
Total Protein: 6.1 g/dL (ref 6.0–8.3)

## 2014-08-03 LAB — CBC
HEMATOCRIT: 34.9 % — AB (ref 36.0–46.0)
HEMOGLOBIN: 11.5 g/dL — AB (ref 12.0–15.0)
MCH: 25.9 pg — ABNORMAL LOW (ref 26.0–34.0)
MCHC: 33 g/dL (ref 30.0–36.0)
MCV: 78.6 fL (ref 78.0–100.0)
PLATELETS: 416 10*3/uL — AB (ref 150–400)
RBC: 4.44 MIL/uL (ref 3.87–5.11)
RDW: 17.3 % — ABNORMAL HIGH (ref 11.5–15.5)
WBC: 7.2 10*3/uL (ref 4.0–10.5)

## 2014-08-03 LAB — APTT: aPTT: 41 seconds — ABNORMAL HIGH (ref 24–37)

## 2014-08-03 LAB — SURGICAL PCR SCREEN
MRSA, PCR: NEGATIVE
Staphylococcus aureus: NEGATIVE

## 2014-08-03 LAB — PROTIME-INR
INR: 1.01 (ref 0.00–1.49)
Prothrombin Time: 13.3 seconds (ref 11.6–15.2)

## 2014-08-03 MED ORDER — VANCOMYCIN HCL IN DEXTROSE 1-5 GM/200ML-% IV SOLN
1000.0000 mg | INTRAVENOUS | Status: AC
  Administered 2014-08-04: 1000 mg via INTRAVENOUS
  Filled 2014-08-03: qty 200

## 2014-08-03 MED ORDER — SODIUM POLYSTYRENE SULFONATE 15 GM/60ML PO SUSP
30.0000 g | Freq: Once | ORAL | Status: AC
  Administered 2014-08-03: 30 g via ORAL
  Filled 2014-08-03 (×2): qty 120

## 2014-08-03 MED ORDER — HYDROCODONE-ACETAMINOPHEN 5-325 MG PO TABS
1.0000 | ORAL_TABLET | ORAL | Status: DC | PRN
  Administered 2014-08-04 – 2014-08-05 (×5): 1 via ORAL
  Filled 2014-08-03 (×5): qty 1

## 2014-08-03 MED ORDER — SODIUM CHLORIDE 0.9 % IV SOLN
INTRAVENOUS | Status: AC
  Administered 2014-08-03: 18:00:00 via INTRAVENOUS

## 2014-08-03 NOTE — Consult Note (Signed)
[place]  [phone]   HEMATOLOGY ONCOLOGY CONSULTATION   Sarah GERDEMAN  DOB: 07-20-1935  MR#: [id number]  CSN#: [id number]    Requesting Physician: [facility]   Primary MD: [name], MD  I have seen the patient, examined her and edited the notes as follows  Reason for Consult: Metastatic Adenocarcinoma  History of present illness:         79 y.o.  female admitted with 3-4 week history of worsening shortness of breath on exertion, lower extremity edema and generalized weakness and exertional chest pain. She had been increasingly weak for at least one year prior to this admission. She had lost appetite, with a 20-30 pound weight loss over the last year, had increased reflux symptoms, increased abdominal girth and progressive constipation. She had nausea, and known bloody emesis over the last year. Due to the symptoms, CT of the chest abdomen and pelvis had been performed. CT of the chest on 8/28 revealed a large left pleural effusion. She underwent ultrasound guided thoracentesis on 8/31 yielding 1500 cc serosanguineous fluid.  CT of the abdomen and pelvis with contrast on 8/29 showed moderate ascites, moderate ascites, diffuse subcutaneous edema, and a  3 x 8 cm mesentery mass suspicious for malignancy Pleural fluid Cytology report, case number ([id number]) is consistent with metastatic adenocarcinoma. Immunohistochemical stains are positive for CK 7, WT 1, and weakly positive for ER. Negative for CK 20, PR, TTF-1 and GCDFP 15, thus favoring Gyn/ovarian primary.  Patient denies any family history of Gyn malignancies. last mammogram in 11/2013 was normal. Denies vaginal bleeding.She had a hysterectomy about 49 years ago for fibroids but states that her ovaries were kept. Denies hot flashes. She denies any mental status changes.  Based on these findings we were asked to the patient in consultation with recommendations. She was seen by Dr. Barnet Glasgow at  [facility] yesterday who directed her admission here specifically for cardiothoracic consult for pleurodesis and Gyn Oncologist consultation.  Past medical history: remote history of fibroids, status post hysterectomy   Recent lower extremity cellulitis requiring antibiotics as outpatient Mitral valve prolapse GERD/History of erosive reflux esophagitis Irritable bowel syndrome sigmoid colon Diverticulosis  Past surgical history:      Past Surgical History  Procedure Laterality Date  . Cholecystectomy    . Cardiac catheterization    status post hysterectomy, in the 1960's secondary to fibroids, without oophorectomy per sister report status post bladder tack Status post hemorrhoidectomy Status post left total knee replacement 2006  Medications:  Prior to Admission:  the patient was taking antacids for reflux, otherwise no other meds YHC:WCBJSE chloride, ALPRAZolam, alum & mag hydroxide-simeth, HYDROcodone-acetaminophen, HYDROmorphone (DILAUDID) injection, ondansetron (ZOFRAN) IV, ondansetron, sodium chloride  Scheduled Meds: . enoxaparin (LOVENOX) injection  40 mg Subcutaneous Q24H  . levofloxacin  500 mg Oral q1800  . pantoprazole  40 mg Oral q morning - 10a  . potassium chloride  20 mEq Oral BID  . sodium chloride  3 mL Intravenous Q12H  . sodium chloride  3 mL Intravenous Q12H    Allergies:  Allergies  Allergen Reactions  . Penicillins     Family history:   Mother and Father dies with heart disease. One aunt with breast cancer One son with multiple sclerosis   Social history: Retired. Widowed. Sits for an 53 year old woman. 4 children, One died in a motor vehicle accident. Lives in [place]. No tobacco or alcohol history.   ROS: Constitutional: Denies fevers,  chills or abnormal night sweats Eyes: Denies blurriness of vision, double vision or watery eyes Ears, nose, mouth, throat, and face: Denies mucositis or sore throat Respiratory:She ha had a  chronic non productive cough for about 1 year, with dyspnea but no wheezes Cardiovascular: Denies palpitations,she had chest pain on presentation,  she admits to  lower extremity swelling over the last 3-4 weeks. Gastrointestinal:  Denies change in bowel habits.SHe has chronic constipation. She has decreased appetite with a 20-30 pound weight loss over the last year, increased nausea and emesis, increased abdominal girth and cramping as mentioned in history of present illness. Skin: Denies abnormal skin rashes, but recently was treated for left lower extremity cellulitis but her primary physician Lymphatics: Denies new lymphadenopathy or easy bruising Neurological:Denies numbness, tingling or new weaknesses Behavioral/Psych: Mood is stable, no new changes  All other systems were reviewed with the patient and are negative.   Physical Exam    Filed Vitals:   08/03/14 0522  BP: 111/67  Pulse: 96  Temp: 97.8 F (36.6 C)  Resp: 20   Filed Weights   08/01/14 1700 08/02/14 0441 08/03/14 0209  Weight: 153 lb 7 oz (69.6 kg) 152 lb 5.4 oz (69.1 kg) 150 lb 12.7 oz (68.4 kg)    GENERAL:alert, no distress and comfortable. Thin with temporal wasting noted SKIN: skin color, texture, turgor are normal, no rashes or significant lesions EYES: normal, conjunctiva are pink and non-injected, sclera clear OROPHARYNX:no exudate, no erythema and lips, buccal mucosa, and tongue normal  NECK: supple, thyroid normal size, non-tender, without nodularity LYMPH:  no palpable lymphadenopathy in the cervical, axillary or inguinal LUNGS:decreased breath sounds on the left, clear to auscultation on the right HEART: regular rate & rhythm and no murmurs and 2+ bilateral lower extremity edema ABDOMEN:abdomen soft, distended due to ascites, non-tender and normal bowel sounds. Cannot palpate known large mesenteric mass, but there is fullness on the left lower quadrant Musculoskeletal:no cyanosis of digits and no  clubbing  PSYCH: alert & oriented x 3 with fluent speech NEURO: no focal motor/sensory deficits   Lab results:       CBC  Recent Labs Lab 07/27/14 1942 07/28/14 0546  WBC 6.9 6.3  HGB 11.1* 10.9*  HCT 34.6* 34.1*  PLT 401* 362  MCV 80.1 80.2  MCH 25.7* 25.6*  MCHC 32.1 32.0  RDW 17.2* 17.2*    Anemia panel:  No results found for this basename: VITAMINB12, FOLATE, FERRITIN, TIBC, IRON, RETICCTPCT,  in the last 72 hours   Chemistries   Recent Labs Lab 07/27/14 1942 07/28/14 0546 07/31/14 0352 08/02/14 0605  NA 135* 134* 133* 133*  K 3.6* 3.2* 4.3 4.9  CL 94* 94* 91* 95*  CO2 29 30 31  32  GLUCOSE 115* 98 104* 91  BUN 22 21 21  32*  CREATININE 0.81 0.75 1.37* 1.09  CALCIUM 7.8* 7.5* 7.7* 7.8*     Coagulation profile  Recent Labs Lab 07/28/14 0546  INR 1.09    Urine Studies No results found for this basename: UACOL, UAPR, USPG, UPH, UTP, UGL, UKET, UBIL, UHGB, UNIT, UROB, ULEU, UEPI, UWBC, URBC, UBAC, CAST, CRYS, UCOM, BILUA,  in the last 72 hours  Studies:   I have reviewed the CT scans myself Ct Chest W Contrast 07/29/14 CT CHEST FINDINGS  The heart and great vessels are within normal limits.  A large left pleural effusion and very small right pleural effusion are noted with bilateral lower lung atelectasis, left greater than right.  A  tiny pleural pericardial effusion is noted.  There is no evidence of pleural mass or definite thickening.  There is no evidence of airspace disease, definite mass, nodule, or endobronchial/ endotracheal lesion.  No enlarged lymph nodes identified.  Mild subcutaneous edema is present.  No acute or suspicious bony abnormalities are noted.   IMPRESSION: Large left pleural effusion, moderate ascites, diffuse subcutaneous edema and small right pleural effusion. Associated bilateral lower lung atelectasis.  Ct Abdomen Pelvis W Contrast   CT ABDOMEN AND PELVIS FINDINGS  Moderate ascites is present.  Medial left/ anterior right liver  scarring is noted. No focal hepatic abnormalities are otherwise identified.  Spleen, pancreas, adrenal glands and kidneys are unremarkable except for bilateral renal cortical atrophy. Patient is status post cholecystectomy.  There is no evidence of biliary dilatation, enlarged lymph nodes or abdominal aortic aneurysm.  Extensive descending and sigmoid colonic diverticulosis noted without definite diverticulitis.  There is no evidence of bowel obstruction, peritoneal thickening or nodularity or pneumoperitoneum.  Ill-defined increased density measuring 3 x 8 cm within the mesenteric is nonspecific.  No acute or suspicious bony abnormalities are identified.  IMPRESSION: 3 x 8 cm ill-defined density within the mesentery - solid mass/neoplasm is not excluded. Consider PET-CT for further evaluation as clinically indicated.  Bilateral renal cortical atrophy and colonic diverticulosis.   Electronically Signed   By: Hassan Rowan M.D.   On: 07/29/2014 12:07    2 D Echo shows normal LVEF, fairly mild diastolic dysfunction. Patient has known history of mild left pericardial effusion which is believed to be exudative  FINAL for [name], [name] ([id number])  Patient: [name], [name] Collected: 07/28/2014 Client: [facility]  Accession: [id number] Received: 07/29/2014 Lavonia Dana  DOB: 1935/11/24 Age: 26 Gender: F Reported: 08/01/2014 [address]  Patient Ph: [phone] MRN#: [id number] Linna Hoff [place] [address]  Client Acc#: Chart: Phone: [phone] Fax:  LMP: Visit#: [id number] CC: Eleonore Chiquito, MD  Sinda Du  CYTOPATHOLOGY REPORT  ADDITIONAL INFORMATION:  The malignant cells show the following immunoprofile:  CK7: positive  CK20: negative  WT-1: positive  ER: weakly positive  PR-negative  TTF-1: negative  GCDFP-15: negative  Controls are appropriate.  The findings are diagnostic for metastatic adenocarcinoma and favor gynecologic/ovarian primary. Clinical and  radiographic correlation is  recommended.  Aldona Bar MD  Pathologist, Electronic Signature  ( Signed 08/02/2014)   Diagnosis  PLEURAL FLUID, LEFT(SPECIMEN 1 OF 1 COLLECTED 07/28/14):  MALIGNANT CELLS CONSISTENT WITH METASTATIC ADENOCARCINOMA.    Assessment/Plan:79 y.o. female  with   Metastatic Adenocarcinoma Malignant Pleural Effusion Results are consistent with Gyn Primary such as ovarian cancer. last mammogram in 11/2013 was negative. Will check CA 125, CA 27-29 and CEA.  Dr. Skeet Latch, [facility] was consulted and will see patient on 9/3 in consultation For her pleural effusion, I have discussed with hospitalist who would be consulting CT surgery.  Anemia Of chronic disease, blood loss, malignancy and decreased oral intake  No bleeding issues noted. No transfusion is indicated at this time  Decreased Oral Intake with weight loss Consider Nutrition evaluation  DVT prophylaxis On Lovenox  DNR  Elease Hashimoto 08/03/2014  [facility], Laurisa Sahakian, MD 08/03/2014

## 2014-08-03 NOTE — Progress Notes (Signed)
TELEMETRY: Reviewed telemetry pt in NSR: Filed Vitals:   08/02/14 1425 08/02/14 2210 08/03/14 0209 08/03/14 0522  BP: 117/52 114/68 107/68 111/67  Pulse: 97 100 97 96  Temp: 98.6 F (37 C) 98.2 F (36.8 C) 98 F (36.7 C) 97.8 F (36.6 C)  TempSrc: Oral Oral Oral Oral  Resp: 20 18 20 20   Height:   5\' 5"  (1.651 m)   Weight:   150 lb 12.7 oz (68.4 kg)   SpO2: 97% 98% 99% 100%    Intake/Output Summary (Last 24 hours) at 08/03/14 0746 Last data filed at 08/03/14 0420  Gross per 24 hour  Intake    240 ml  Output   1700 ml  Net  -1460 ml   Filed Weights   08/01/14 1700 08/02/14 0441 08/03/14 0209  Weight: 153 lb 7 oz (69.6 kg) 152 lb 5.4 oz (69.1 kg) 150 lb 12.7 oz (68.4 kg)    Subjective Feels OK. Depressed.  . enoxaparin (LOVENOX) injection  40 mg Subcutaneous Q24H  . levofloxacin  500 mg Oral q1800  . pantoprazole  40 mg Oral q morning - 10a  . potassium chloride  20 mEq Oral BID  . sodium chloride  3 mL Intravenous Q12H  . sodium chloride  3 mL Intravenous Q12H      LABS: Basic Metabolic Panel:  Recent Labs  08/02/14 0605  NA 133*  K 4.9  CL 95*  CO2 32  GLUCOSE 91  BUN 32*  CREATININE 1.09  CALCIUM 7.8*   Liver Function Tests: No results found for this basename: AST, ALT, ALKPHOS, BILITOT, PROT, ALBUMIN,  in the last 72 hours No results found for this basename: LIPASE, AMYLASE,  in the last 72 hours CBC: No results found for this basename: WBC, NEUTROABS, HGB, HCT, MCV, PLT,  in the last 72 hours Cardiac Enzymes: No results found for this basename: CKTOTAL, CKMB, CKMBINDEX, TROPONINI,  in the last 72 hours BNP: No results found for this basename: PROBNP,  in the last 72 hours D-Dimer: No results found for this basename: DDIMER,  in the last 72 hours Hemoglobin A1C: No results found for this basename: HGBA1C,  in the last 72 hours Fasting Lipid Panel: No results found for this basename: CHOL, HDL, LDLCALC, TRIG, CHOLHDL, LDLDIRECT,  in the last  72 hours Thyroid Function Tests: No results found for this basename: TSH, T4TOTAL, FREET3, T3FREE, THYROIDAB,  in the last 72 hours   Radiology/Studies:  Dg Chest 1 View  08/01/2014   CLINICAL DATA:  Post LEFT thoracentesis  EXAM: CHEST - 1 VIEW  COMPARISON:  07/30/2014  FINDINGS: Decrease in LEFT pleural effusion post thoracentesis.  Moderate persistent LEFT pleural effusion and LEFT basilar atelectasis remain.  Heart appears enlarged.  Minimal RIGHT base atelectasis.  No pneumothorax following thoracentesis.  IMPRESSION: Decrease in LEFT pleural effusion and atelectasis post thoracentesis without evidence of postprocedural pneumothorax.  Enlargement of cardiac silhouette with minimal RIGHT basilar atelectasis.   Electronically Signed   By: Lavonia Dana M.D.   On: 08/01/2014 12:53   US Thoracentesis Asp Pleural Space W/img Guide  08/01/2014   CLINICAL DATA:  Persistent LEFT pleural effusion  EXAM: US THORACENTESIS ASP PLEURAL SPACE W/IMG GUIDE  TECHNIQUE: Procedure, benefits, and risks of procedure were discussed with patient.  Written informed consent for procedure was obtained.  Time out protocol followed.  Pleural effusion localized by ultrasound at the posterior LEFT hemithorax.  Skin prepped and draped in usual sterile fashion.  Skin  and soft tissues anesthetized with 8 mL of 1% lidocaine.  8 French thoracentesis catheter placed into the LEFT pleural space.  1500 mL of dark old appearing bloody fluid aspirated by syringe pump.  Procedure tolerated well by patient without immediate complication.  Fluid sent to laboratory for requested analysis.  COMPARISON:  07/28/2014  FINDINGS: As above  IMPRESSION: Ultrasound-guided thoracentesis of the LEFT pleural cavity with removal of 1500 mL of dark old appearing bloody fluid.   Electronically Signed   By: Lavonia Dana M.D.   On: 08/01/2014 12:49    PHYSICAL EXAM General: Well developed, well nourished, in no acute distress. Head: Normal Neck: Negative for  carotid bruits. JVD not elevated. No adenopathy Lungs: Decreased BS left base. Heart: RRR S1 S2 without murmurs, rubs, or gallops.  Abdomen: Soft, non-tender, non-distended with normoactive bowel sounds.  Extremities: 2+ edema.  Distal pedal pulses are 2+ and equal bilaterally. Neuro: Alert and oriented X 3. Moves all extremities spontaneously. Psych:  Responds to questions appropriately with a normal affect.  ASSESSMENT AND PLAN: 79 [facility] WF [facility]. Presented with left pleural effusion, ascites, and lower extremity edema. Found to have abdominal mass. S/p paracentesis and pleural tap. Cytology positive for adenoCA. EF normal by Echo. Patient is stable from a cardiac standpoint. Little further to add- so I will sign off. Please call if we can assist further.  Present on Admission:  . Pleural effusion . Edema of both legs . Heart failure, diastolic, with acute decompensation  Signed, Sayward Horvath [place], [place] 08/03/2014 7:46 AM

## 2014-08-03 NOTE — Consult Note (Signed)
Reason for Consult:Malignant left pleural effusion Referring Physician: Dr. Lanice Schwab Khan/ Dr. Sinda Du  [name] is an 79 y.o. female.  HPI: 79 yo woman admitted to [facility] last week with ~ 4 week history of cough, shortness of breath, general malaise, and peripheral edema.  [name] is a 79 yo woman with an unremarkable past history who was working up until last week. She has been confused since admission to the hospital and is a poor historian. Much of her history comes from her sister and her female friend who accompany her. She has been feeling poorly for about a month. She has been having a frequent cough and has been short of breath with exertion. She was treated empirically with antibiotics without improvement. She also has had a poor appetite and nausea and a 20 pound weight loss over the past several months.   She was sitting with an elderly patient last week when a HHRN came to check on the patient. She asked the [facility] to check her as well and was sent to the [facility] ED. She required [place] O2. Her initial CXR showed a large left pleural effusion. She had thoracentesis on 8/27 and 8/31. 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 A CT of the abdomen showed a mesenteric mass. She had symptomatic improvement from thoracentesis, but it was short-lived.    Past Medical History  Diagnosis Date  . GERD (gastroesophageal reflux disease)   . Osteoarthritis   . Peripheral edema   . CHF (congestive heart failure), NYHA class IV     diastolic    Past Surgical History  Procedure Laterality Date  . Cholecystectomy    . Cardiac catheterization    . Total abdominal hysterectomy  40 years ago    patient does not know if ovaries were removed  . Appendectomy    . Total knee arthroplasty Left     Family History  Problem Relation Age of Onset  . Family history unknown: Yes    Social History:  reports that she has never smoked. She does not have any  smokeless tobacco history on file. She reports that she does not drink alcohol or use illicit drugs.  Allergies:  Allergies  Allergen Reactions  . Penicillins     Medications:  Scheduled: . enoxaparin (LOVENOX) injection  40 mg Subcutaneous Q24H  . levofloxacin  500 mg Oral q1800  . pantoprazole  40 mg Oral q morning - 10a  . potassium chloride  20 mEq Oral BID  . sodium chloride  3 mL Intravenous Q12H  . sodium chloride  3 mL Intravenous Q12H    Results for orders placed during the hospital encounter of 07/27/14 (from the past 48 hour(s))  BASIC METABOLIC PANEL     Status: Abnormal   Collection Time    08/02/14  6:05 AM      Result Value Ref Range   Sodium 133 (*) 137 - 147 mEq/L   Potassium 4.9  3.7 - 5.3 mEq/L   Chloride 95 (*) 96 - 112 mEq/L   CO2 32  19 - 32 mEq/L   Glucose, Bld 91  70 - 99 mg/dL   BUN 32 (*) 6 - 23 mg/dL   Creatinine, Ser 1.09  0.50 - 1.10 mg/dL   Calcium 7.8 (*) 8.4 - 10.5 mg/dL   GFR calc non Af Amer 48 (*) >90 mL/min   GFR calc Af Amer 55 (*) >90 mL/min   Comment: (NOTE)  The eGFR has been calculated using the CKD EPI equation.     This calculation has not been validated in all clinical situations.     eGFR's persistently <90 mL/min signify possible Chronic Kidney     Disease.   Anion gap 6  5 - 15    No results found.  Review of Systems  Constitutional: Positive for weight loss (has lost 20 pounds) and malaise/fatigue.  Respiratory: Positive for cough and shortness of breath.   Cardiovascular: Positive for leg swelling. Negative for chest pain.  Gastrointestinal: Positive for nausea, vomiting and blood in stool.       Bloating, reflux  Neurological: Positive for weakness.       Confusion since hospital admission  Endo/Heme/Allergies: Bruises/bleeds easily.  All other systems reviewed and are negative.  Blood pressure 111/67, pulse 96, temperature 97.8 F (36.6 C), temperature source Oral, resp. rate 20, height 5' 5"  (1.651 m),  weight 150 lb 12.7 oz (68.4 kg), SpO2 100.00%. Physical Exam  Vitals reviewed. Constitutional: No distress.  Elderly, frail-appearing  HENT:  Head: Normocephalic and atraumatic.  Eyes: Pupils are equal, round, and reactive to light.  Neck: No tracheal deviation present. No thyromegaly present.  Cardiovascular: Normal rate and regular rhythm.   Murmur (2/6 systolic) heard. Respiratory:  Increased WOB, markedly diminished BS left base  GI:  Distended, nontender  Musculoskeletal: She exhibits edema (1+).  Lymphadenopathy:    She has no cervical adenopathy.  Neurological: She is alert. No cranial nerve deficit.  Confused, but cooperative   CT CHEST, ABDOMEN, AND PELVIS WITH CONTRAST  TECHNIQUE:  Multidetector CT imaging of the chest, abdomen and pelvis was  performed following the standard protocol during bolus  administration of intravenous contrast.  CONTRAST: 129m OMNIPAQUE IOHEXOL 300 MG/ML SOLN  COMPARISON: None.  FINDINGS:  CT CHEST FINDINGS  The heart and great vessels are within normal limits.  A large left pleural effusion and very small right pleural effusion  are noted with bilateral lower lung atelectasis, left greater than  right.  A tiny pleural pericardial effusion is noted.  There is no evidence of pleural mass or definite thickening.  There is no evidence of airspace disease, definite mass, nodule, or  endobronchial/ endotracheal lesion.  No enlarged lymph nodes identified.  Mild subcutaneous edema is present.  No acute or suspicious bony abnormalities are noted.  CT ABDOMEN AND PELVIS FINDINGS  Moderate ascites is present.  Medial left/ anterior right liver scarring is noted. No focal  hepatic abnormalities are otherwise identified.  Spleen, pancreas, adrenal glands and kidneys are unremarkable except  for bilateral renal cortical atrophy. Patient is status post  cholecystectomy.  There is no evidence of biliary dilatation, enlarged lymph nodes or   abdominal aortic aneurysm.  Extensive descending and sigmoid colonic diverticulosis noted  without definite diverticulitis.  There is no evidence of bowel obstruction, peritoneal thickening or  nodularity or pneumoperitoneum.  Ill-defined increased density measuring 3 x 8 cm within the  mesenteric is nonspecific.  No acute or suspicious bony abnormalities are identified.  IMPRESSION:  Large left pleural effusion, moderate ascites, diffuse subcutaneous  edema and small right pleural effusion. Associated bilateral lower  lung atelectasis.  3 x 8 cm ill-defined density within the mesentery - solid  mass/neoplasm is not excluded. Consider PET-CT for further  evaluation as clinically indicated.  Bilateral renal cortical atrophy and colonic diverticulosis.  Electronically Signed  By: JHassan RowanM.D.  On: 07/29/2014 12:07   CLINICAL DATA: Post LEFT thoracentesis  EXAM:  CHEST - 1 VIEW  COMPARISON: 07/30/2014  FINDINGS:  Decrease in LEFT pleural effusion post thoracentesis.  Moderate persistent LEFT pleural effusion and LEFT basilar  atelectasis remain.  Heart appears enlarged.  Minimal RIGHT base atelectasis.  No pneumothorax following thoracentesis.  IMPRESSION:  Decrease in LEFT pleural effusion and atelectasis post thoracentesis  without evidence of postprocedural pneumothorax.  Enlargement of cardiac silhouette with minimal RIGHT basilar  atelectasis.  Electronically Signed  By: Lavonia Dana M.D.  On: 08/01/2014 12:53   Assessment/Plan: 79 yo woman with a malignant left pleural effusion, likely from a abdominal source. She has had 2 thoracenteses draining 1.5 liters on each occasion. Her most recent post thoracentesis film showed a moderate left pleural effusion still present. She will most likely continue to reaccumulate fluid and remain symptomatic unless we intervene.  There a 2 options.  1. VATS with talc pleurodesis 2. Pleural catheter placement  I do not think she is a  good candidate for talc. That would require general anesthesia and would have a longer recovery time. She is already severely malnourished with extensive weight loss and appears very frail. I think a pleural catheter is a better option in her case. We can always talc through the tube later if necessary.  I discussed the indications, risks, benefits and alternatives of left pleural catheter placement with Mrs. Amorin, her sister and her female friend. The understand the advantages and disadvantages of this approach. They understand that she will have an indwelling catheter that will require care. They understand the general nature of the procedure. They understand the risks include, but are not limited to bleeding, infection, catheter malfunction, catheter malposition. They accept the risks and agree to proceed.  We will arrange transportation to [facility] OR tomorrow AM, She will return to [place] postop.  Odis Turck C 08/03/2014, 1:20 PM

## 2014-08-03 NOTE — Telephone Encounter (Signed)
Consult Note: Gyn-Onc  Consult was requested by Dr. Modena Jansky for the evaluation of [name] 79 y.o. female  CC: Metastatic gyn primary  Assessment/Plan:  [name]  is a 79 y.o.  with bilateral pleural effusions and cytology favouring metastatic adenocarcinoma favour gyn primary. Stage IVB  HPI: [name]  is a 79 y.o.   GxPx who per review of the medical records has felt poorly for about a month. There is a report of cough and SOB with exertion. Abx were prescribed without improvement.  Patient also reported a poor appetite and nausea and a 20 pound weight loss over the past several months.  She was admitted to [name] and the initial   CXR showed a large left pleural effusion. She had thoracentesis on 8/27 and 8/31. And there was a brief symptomatic improvement from thoracentesis.  [name] was transferred to [name].     07/27/2014 CT Chest A large left pleural effusion and very small right pleural effusion are noted with bilateral lower lung atelectasis, left greater than right.   Ct Abdomen notable for moderate ascites,   Extensive descending and sigmoid colonic diverticulosis noted without definite diverticulitis. There is no evidence of bowel obstruction, peritoneal thickening or nodularity or pneumoperitoneum. Ill-defined increased density measuring 3 x 8 cm within the mesenteric is nonspecific.  Pleurodesis 08/01/2014 mesastatic adenocarcinoma immunostains favour a gyn primary.  Last colonoscopy Last mammogram  CA 19-9 10.6 CEA 1.6  [name], [name] (MRN [id number]) as of 08/03/2014 21:46  Ref. Range 08/03/2014 14:50  WBC Latest Range: 4.0-10.5 K/uL 7.2  RBC Latest Range: 3.87-5.11 MIL/uL 4.44  Hemoglobin Latest Range: 12.0-15.0 g/dL 11.5 (L)  HCT Latest Range: 36.0-46.0 % 34.9 (L)  MCV Latest Range: 78.0-100.0 fL 78.6  MCH Latest Range: 26.0-34.0 pg 25.9 (L)  MCHC Latest Range: 30.0-36.0 g/dL 33.0  RDW Latest Range: 11.5-15.5 %  17.3 (H)  Platelets Latest Range: 150-400 K/uL 416 (H)   Review of Systems:  Constitutional  Feels  Cardiovascular  No chest pain, shortness of breath, or edema  Pulmonary  No cough or wheeze.  Gastro Intestinal  No nausea, vomitting, or diarrhoea. No bright red blood per rectum, no abdominal pain, change in bowel movement, or constipation.  Genito Urinary  No frequency, urgency, dysuria,  Musculo Skeletal  No myalgia, arthralgia, joint swelling or pain  Neurologic  No weakness, numbness, change in gait,  Psychology  No depression, anxiety, insomnia.    Current Meds:  No outpatient encounter prescriptions on file as of 08/03/2014.    Allergy:  Allergies  Allergen Reactions  . Penicillins     Social Hx:   History   Social History  . Marital Status: Widowed    Spouse Name: N/A    Number of Children: N/A  . Years of Education: N/A   Occupational History  . nurses aid    Social History Main Topics  . Smoking status: Never Smoker   . Smokeless tobacco: Not on file  . Alcohol Use: No  . Drug Use: No  . Sexual Activity: Not on file   Other Topics Concern  . Not on file   Social History Narrative  . No narrative on file    Past Surgical Hx:  Past Surgical History  Procedure Laterality Date  . Cholecystectomy    . Cardiac catheterization    . Total abdominal hysterectomy  40 years ago    patient does  not know if ovaries were removed  . Appendectomy    . Total knee arthroplasty Left     Past Medical Hx:  Past Medical History  Diagnosis Date  . GERD (gastroesophageal reflux disease)   . Osteoarthritis   . Peripheral edema   . CHF (congestive heart failure), NYHA class IV     diastolic    Past Gynecological History:   No LMP recorded. Patient has had a hysterectomy. Hysterectomy approximately 40 years ago.  Family Hx:  Family History  Problem Relation Age of Onset  . Family history unknown: Yes    Vitals:   Physical Exam: WD in NAD Neck   Supple NROM, without any enlargements.  Lymph Node Survey No cervical supraclavicular or inguinal adenopathy Cardiovascular  Pulse normal rate, regularity and rhythm. S1 and S2 normal.  Lungs  Clear to auscultation bilaterally, without wheezes/crackles/rhonchi. Good air movement.  Skin  No rash/lesions/breakdown  Psychiatry  Alert and oriented appropriate mood affect speech and reasoning. Abdomen  Normoactive bowel sounds, abdomen soft, non-tender. Surgical  sites intact without evidence of hernia.  Back No CVA tenderness Genito Urinary  Vulva/vagina: Normal external female genitalia.  No lesions. No discharge or bleeding.  Bladder/urethra:  No lesions or masses  Vagina:   Cervix: Normal appearing, no lesions.  Uterus: Mobile, no parametrial involvement or nodularity.  Adnexa: No palpable masses. Rectal  Good tone, no masses no cul de sac nodularity.  Extremities  No bilateral cyanosis, clubbing or edema.   Janie Morning, MD, PhD

## 2014-08-03 NOTE — Progress Notes (Signed)
PROGRESS NOTE    [name] [id number]:[id number] DOB: [birth year] DOA: 07/27/2014 PCP: [name], MD  HPI/Brief narrative 79 year old female patient with history of GERD, diastolic CHF, admitted to [facility] on 07/27/14 with worsening dyspnea and leg edema of 3-4 week duration. She was found to have large left pleural effusion and underwent thoracentesis x2-cytology showed metastatic adenocarcinoma suspicious for GYN/peritoneal primary. Oncologist at [facility] recommended transfer to [facility] for evaluation by GYN oncologist and cardiothoracic surgeon for possible pleurodesis.   Assessment/Plan:  1. Malignant left pleural effusion: Status post 2 thoracentesis on 8/27 and 8/31. Medical oncologist consulted who have intern consulted GYN oncologist who will see patient on 08/04/14. Thoracic surgery consulted and do not think that she is a good candidate for VATS with talc pleurodesis and hence plan for pleural catheter on 08/04/14. On levofloxacin for presumed pneumonia-? Discontinue 2. Anemia: Secondary to chronic disease, malignancy and blood loss: No reported bleeding. Hemoglobin stable. 3. Hyperkalemia: Likely secondary to potassium supplements. DC potassium. We'll provide a dose of Kayexalate and follow BMP in a few hours. 4. Hyponatremia: May be secondary to dehydration versus SIADH. Brief gentle saline hydration and follow BMP in a.m. 5. Acute diastolic CHF: Compensated and may be even slightly dry. Cardiology signed off 9/2. 6. Mesenteric mass on CT: Management per oncology.   Code Status: DO NOT RESUSCITATE Family Communication: Discussed extensively with patient's spouse, sister & granddaughter at bedside. Disposition Plan: To be determined   Consultants:  Medical oncology  Cardiothoracic surgery  Procedures:  None  Antibiotics:  Oral levofloxacin 8/30 >   Subjective: No change in dyspnea. Denies chest pain or other complaints. Seems slightly  confused.  Objective: Filed Vitals:   08/02/14 2210 08/03/14 0209 08/03/14 0522 08/03/14 1350  BP: 114/68 107/68 111/67 108/71  Pulse: 100 97 96 95  Temp: 98.2 F (36.8 C) 98 F (36.7 C) 97.8 F (36.6 C) 98.2 F (36.8 C)  TempSrc: Oral Oral Oral Oral  Resp: 18 20 20 16   Height:  5\' 5"  (1.651 m)    Weight:  68.4 kg (150 lb 12.7 oz)    SpO2: 98% 99% 100% 97%    Intake/Output Summary (Last 24 hours) at 08/03/14 1742 Last data filed at 08/03/14 1000  Gross per 24 hour  Intake    120 ml  Output    900 ml  Net   -780 ml   Filed Weights   08/01/14 1700 08/02/14 0441 08/03/14 0209  Weight: 69.6 kg (153 lb 7 oz) 69.1 kg (152 lb 5.4 oz) 68.4 kg (150 lb 12.7 oz)     Exam:  General exam: Frail elderly female lying comfortably propped up in bed. Respiratory system: Markedly diminished breath sounds left lung fields and right base. Rest of the right lung fields clear to auscultation. No increased work of breathing. Cardiovascular system: S1 & S2 heard, RRR. No JVD, murmurs, gallops, clicks. Trace bilateral leg edema. Telemetry: Sinus rhythm Gastrointestinal system: Abdomen is nondistended, soft and nontender. Normal bowel sounds heard. Central nervous system: Alert and oriented. No focal neurological deficits. Extremities: Symmetric 5 x 5 power.   Data Reviewed: Basic Metabolic Panel:  Recent Labs Lab 07/27/14 1942 07/28/14 0546 07/31/14 0352 08/02/14 0605 08/03/14 1450  NA 135* 134* 133* 133* 131*  K 3.6* 3.2* 4.3 4.9 5.7*  CL 94* 94* 91* 95* 92*  CO2 29 30 31  32 30  GLUCOSE 115* 98 104* 91 105*  BUN 22 21 21  32* 28*  CREATININE 0.81 0.75 1.37* 1.09 0.84  CALCIUM 7.8* 7.5* 7.7* 7.8* 8.3*   Liver Function Tests:  Recent Labs Lab 07/28/14 0546 08/03/14 1450  AST 19 19  ALT 13 11  ALKPHOS 63 61  BILITOT 0.4 0.3  PROT 5.8* 6.1  ALBUMIN 2.2* 2.0*   No results found for this basename: LIPASE, AMYLASE,  in the last 168 hours No results found for this basename:  AMMONIA,  in the last 168 hours CBC:  Recent Labs Lab 07/27/14 1942 07/28/14 0546 08/03/14 1450  WBC 6.9 6.3 7.2  HGB 11.1* 10.9* 11.5*  HCT 34.6* 34.1* 34.9*  MCV 80.1 80.2 78.6  PLT 401* 362 416*   Cardiac Enzymes:  Recent Labs Lab 07/27/14 1942 07/28/14 1717 07/31/14 0352  TROPONINI <0.30 <0.30 <0.30   BNP (last 3 results)  Recent Labs  07/27/14 1942  PROBNP 456.7*   CBG: No results found for this basename: GLUCAP,  in the last 168 hours  Recent Results (from the past 240 hour(s))  BODY FLUID CULTURE     Status: None   Collection Time    07/28/14 11:46 AM      Result Value Ref Range Status   Specimen Description FLUID PLEURAL LEFT   Final   Special Requests NONE   Final   Gram Stain     Final   Value: NO WBC SEEN     NO ORGANISMS SEEN     Performed at [facility]   Culture     Final   Value: NO GROWTH 3 DAYS     Performed at [facility]   Report Status 07/31/2014 FINAL   Final         Studies: Dg Chest 2 View  08/03/2014   CLINICAL DATA:  Short of breath.  Followup pleural effusions  EXAM: CHEST  2 VIEW  COMPARISON:  08/01/2014  FINDINGS: There are bilateral pleural effusions identified, left greater than right. Compared with previous exam these appear similar in volume. Left pleural effusion appears to layer posteriorly which may reflect semi-upright technique. The heart border is obscured by pleural fluid.  IMPRESSION: 1. Similar volume of pleural effusions left greater night.   Electronically Signed   By: Kerby Moors M.D.   On: 08/03/2014 13:47        Scheduled Meds: . enoxaparin (LOVENOX) injection  40 mg Subcutaneous Q24H  . levofloxacin  500 mg Oral q1800  . pantoprazole  40 mg Oral q morning - 10a  . potassium chloride  20 mEq Oral BID  . sodium chloride  3 mL Intravenous Q12H  . sodium chloride  3 mL Intravenous Q12H  . [START ON 08/04/2014] vancomycin  1,000 mg Intravenous On Call to OR   Continuous Infusions:    Principal Problem:   Heart failure, diastolic, with acute decompensation Active Problems:   Pleural effusion   Edema of both legs    Time spent: 52 minutes    Cherie Lasalle, MD, FACP, FHM. [facility] Pager [phone]  If 7PM-7AM, please contact night-coverage [email or web address] Password [email or web address] 08/03/2014, 5:42 PM    LOS: 7 days

## 2014-08-03 NOTE — Progress Notes (Signed)
Patient arrived from [facility] for further evaluation. She is stable will be seen by day team and oncology for further workup

## 2014-08-04 ENCOUNTER — Encounter (HOSPITAL_COMMUNITY): Payer: Medicare HMO | Admitting: Anesthesiology

## 2014-08-04 ENCOUNTER — Inpatient Hospital Stay (HOSPITAL_COMMUNITY): Payer: Medicare HMO

## 2014-08-04 ENCOUNTER — Inpatient Hospital Stay (HOSPITAL_COMMUNITY): Payer: Medicare HMO | Admitting: Anesthesiology

## 2014-08-04 ENCOUNTER — Encounter (HOSPITAL_COMMUNITY): Payer: Self-pay | Admitting: Anesthesiology

## 2014-08-04 ENCOUNTER — Encounter (HOSPITAL_COMMUNITY)
Admission: EM | Disposition: A | Discharge status: Skilled Nursing Facility | Payer: Self-pay | Source: Home / Self Care | Attending: Pulmonary Disease

## 2014-08-04 DIAGNOSIS — J910 Malignant pleural effusion: Secondary | ICD-10-CM

## 2014-08-04 DIAGNOSIS — C569 Malignant neoplasm of unspecified ovary: Secondary | ICD-10-CM

## 2014-08-04 HISTORY — PX: CHEST TUBE INSERTION: SHX231

## 2014-08-04 LAB — BASIC METABOLIC PANEL
Anion gap: 10 (ref 5–15)
BUN: 26 mg/dL — AB (ref 6–23)
CO2: 28 mEq/L (ref 19–32)
CREATININE: 0.89 mg/dL (ref 0.50–1.10)
Calcium: 8.2 mg/dL — ABNORMAL LOW (ref 8.4–10.5)
Chloride: 94 mEq/L — ABNORMAL LOW (ref 96–112)
GFR calc non Af Amer: 61 mL/min — ABNORMAL LOW (ref >90)
GFR, EST AFRICAN AMERICAN: 71 mL/min — AB (ref >90)
Glucose, Bld: 113 mg/dL — ABNORMAL HIGH (ref 70–99)
POTASSIUM: 5 meq/L (ref 3.7–5.3)
Sodium: 132 mEq/L — ABNORMAL LOW (ref 137–147)

## 2014-08-04 LAB — CBC
HCT: 32.9 % — ABNORMAL LOW (ref 36.0–46.0)
Hemoglobin: 10.7 g/dL — ABNORMAL LOW (ref 12.0–15.0)
MCH: 25.8 pg — AB (ref 26.0–34.0)
MCHC: 32.5 g/dL (ref 30.0–36.0)
MCV: 79.5 fL (ref 78.0–100.0)
Platelets: 385 10*3/uL (ref 150–400)
RBC: 4.14 MIL/uL (ref 3.87–5.11)
RDW: 17.4 % — ABNORMAL HIGH (ref 11.5–15.5)
WBC: 6.4 10*3/uL (ref 4.0–10.5)

## 2014-08-04 LAB — CEA: CEA: 1.5 ng/mL (ref 0.0–5.0)

## 2014-08-04 LAB — CANCER ANTIGEN 27.29: CA 27.29: 105 U/mL — ABNORMAL HIGH (ref 0–39)

## 2014-08-04 SURGERY — INSERTION, PLEURAL DRAINAGE CATHETER
Anesthesia: Monitor Anesthesia Care | Site: Chest | Laterality: Left

## 2014-08-04 MED ORDER — 0.9 % SODIUM CHLORIDE (POUR BTL) OPTIME
TOPICAL | Status: DC | PRN
  Administered 2014-08-04: 1000 mL

## 2014-08-04 MED ORDER — SODIUM CHLORIDE 0.9 % IV SOLN
INTRAVENOUS | Status: DC | PRN
  Administered 2014-08-04: 09:00:00 via INTRAVENOUS

## 2014-08-04 MED ORDER — LIDOCAINE HCL 1 % IJ SOLN
INTRAMUSCULAR | Status: DC | PRN
  Administered 2014-08-04: 30 mL

## 2014-08-04 MED ORDER — HYDROMORPHONE HCL PF 1 MG/ML IJ SOLN
0.2500 mg | INTRAMUSCULAR | Status: DC | PRN
Start: 2014-08-04 — End: 2014-08-04

## 2014-08-04 MED ORDER — PROPOFOL INFUSION 10 MG/ML OPTIME
INTRAVENOUS | Status: DC | PRN
  Administered 2014-08-04: 25 ug/kg/min via INTRAVENOUS

## 2014-08-04 MED ORDER — FENTANYL CITRATE 0.05 MG/ML IJ SOLN
INTRAMUSCULAR | Status: DC | PRN
  Administered 2014-08-04 (×2): 25 ug via INTRAVENOUS

## 2014-08-04 MED ORDER — ONDANSETRON HCL 4 MG/2ML IJ SOLN: 4.0000 mg | Freq: Once | INTRAMUSCULAR | Status: DC | PRN

## 2014-08-04 SURGICAL SUPPLY — 28 items
ADH SKN CLS APL DERMABOND .7 (GAUZE/BANDAGES/DRESSINGS) ×1
CANISTER SUCTION 2500CC (MISCELLANEOUS) ×2 IMPLANT
COVER SURGICAL LIGHT HANDLE (MISCELLANEOUS) ×2 IMPLANT
DERMABOND ADVANCED (GAUZE/BANDAGES/DRESSINGS) ×1
DERMABOND ADVANCED .7 DNX12 (GAUZE/BANDAGES/DRESSINGS) ×1 IMPLANT
DRAPE C-ARM 42X72 X-RAY (DRAPES) ×2 IMPLANT
DRAPE LAPAROSCOPIC ABDOMINAL (DRAPES) ×2 IMPLANT
GLOVE BIO SURGEON STRL SZ 6.5 (GLOVE) ×6 IMPLANT
GLOVE ORTHO TXT STRL SZ7.5 (GLOVE) ×4 IMPLANT
GOWN STRL REUS W/ TWL LRG LVL3 (GOWN DISPOSABLE) ×3 IMPLANT
GOWN STRL REUS W/TWL LRG LVL3 (GOWN DISPOSABLE) ×6
KIT BASIN OR (CUSTOM PROCEDURE TRAY) ×2 IMPLANT
KIT PLEURX DRAIN CATH 1000ML (MISCELLANEOUS) ×6 IMPLANT
KIT PLEURX DRAIN CATH 15.5FR (DRAIN) ×4 IMPLANT
KIT ROOM TURNOVER OR (KITS) ×2 IMPLANT
NEEDLE HYPO 25GX1X1/2 BEV (NEEDLE) ×2 IMPLANT
NS IRRIG 1000ML POUR BTL (IV SOLUTION) ×2 IMPLANT
PACK GENERAL/GYN (CUSTOM PROCEDURE TRAY) ×2 IMPLANT
PAD ARMBOARD 7.5X6 YLW CONV (MISCELLANEOUS) ×4 IMPLANT
SET DRAINAGE LINE (MISCELLANEOUS) IMPLANT
SUT ETHILON 3 0 PS 1 (SUTURE) ×2 IMPLANT
SUT SILK 2 0 SH (SUTURE) ×2 IMPLANT
SUT VIC AB 3-0 SH 18 (SUTURE) ×2 IMPLANT
SYR CONTROL 10ML LL (SYRINGE) ×2 IMPLANT
TAPE CLOTH SURG 4X10 WHT LF (GAUZE/BANDAGES/DRESSINGS) ×2 IMPLANT
TOWEL OR 17X24 6PK STRL BLUE (TOWEL DISPOSABLE) ×2 IMPLANT
TOWEL OR 17X26 10 PK STRL BLUE (TOWEL DISPOSABLE) ×2 IMPLANT
WATER STERILE IRR 1000ML POUR (IV SOLUTION) ×2 IMPLANT

## 2014-08-04 NOTE — Anesthesia Postprocedure Evaluation (Signed)
  Anesthesia Post-op Note  Patient: [name]  Procedure(s) Performed: Procedure(s): INSERTION PLEURAL DRAINAGE CATHETER (Left)  Patient Location: PACU  Anesthesia Type:MAC  Level of Consciousness: awake, alert , oriented and patient cooperative  Airway and Oxygen Therapy: Patient Spontanous Breathing  Post-op Pain: none  Post-op Assessment: Post-op Vital signs reviewed, Patient's Cardiovascular Status Stable, Respiratory Function Stable, Patent Airway, No signs of Nausea or vomiting and Pain level controlled  Post-op Vital Signs: stable  Last Vitals:  Filed Vitals:   08/04/14 0930  BP: 113/61  Pulse: 92  Temp:   Resp: 17    Complications: No apparent anesthesia complications

## 2014-08-04 NOTE — Progress Notes (Signed)
PT Cancellation Note  Patient Details Name: [name] MRN: [id number] DOB: Nov 11, 1935   Cancelled Treatment:    Reason Eval/Treat Not Completed: Patient at procedure or test/unavailable. Pt at [facility] for procedure.  Will hold PT at this time and f/u tomorrow.     Yanique Mulvihill, Thornton Papas 08/04/2014, 6:54 AM

## 2014-08-04 NOTE — Consult Note (Signed)
Consult Note: Gyn-Onc   Consult was requested by Heath Lark MD  for the evaluation of Amiri M Belmontes 79 y.o. female    CC: Metastatic gyn primary   Assessment/Plan:  Ms. JOSETTA WIGAL is a 79 y.o. with bilateral pleural effusions and cytology favouring metastatic adenocarcinoma favour gyn primary. Stage IVB  Prognosis is poor.  The likely hood ofcure is 14-20% however there is the possibility of prolonging survival and having a meaningful disease free interval.  Options presented.   1.  Neoadjuvant chemotherapy for three cycles followed by interval debulking 2. Chemotherapy only 3.  Hospice  This discussion was held with the patient and her son. At this time the patient is leaning to neoadjuvant chemotherapy.  My recommendation would be for dose dense carboplatin/paclitaxel for 3-4 cycles with interval debulking dependant upon the response of the pleural effusions and improvement in her QOL  Will follow. Request follow-up with Gyn Onc in 2 months.   HPI: Ms. SUSANNE BAUMGARNER is a 79 y.o. G4P4 who  has felt poorly for several months.  There is a report of cough and SOB with exertion since 11/2013. Abx were prescribed without improvement. Patient also reported a poor appetite and nausea and a 20 pound weight loss over the past several months.   She was admitted to [facility] one week ago and the initial CXR showed a large left pleural effusion. She had thoracentesis on 8/27 and 8/31. And there was a brief symptomatic improvement from thoracentesis. CLEA DUBACH was transferred to [facility].  Chest tube was placed this morning.  07/27/2014 CT Chest A large left pleural effusion and very small right pleural effusion are noted with bilateral lower lung atelectasis, left greater than right.   Ct Abdomen notable for moderate ascites, Extensive descending and sigmoid colonic diverticulosis noted without definite diverticulitis. There is no evidence of bowel obstruction, peritoneal thickening or nodularity  or pneumoperitoneum. Ill-defined increased density measuring 3 x 8 cm within the mesenteric is nonspecific.  Pleurodesis 08/01/2014 mesastatic adenocarcinoma immunostains favour a gyn primary.   Last colonoscopy 10 years ago Last mammogram earlier this year.  CA 19-9 10.6 CEA 1.6 CA 125 peding Results for MALISSIA, RABBANI (MRN [id number]) as of 08/03/2014 21:46   Ref. Range  08/03/2014 14:50   WBC  Latest Range: 4.0-10.5 K/uL  7.2   RBC  Latest Range: 3.87-5.11 MIL/uL  4.44   Hemoglobin  Latest Range: 12.0-15.0 g/dL  11.5 (L)   HCT  Latest Range: 36.0-46.0 %  34.9 (L)   MCV  Latest Range: 78.0-100.0 fL  78.6   MCH  Latest Range: 26.0-34.0 pg  25.9 (L)   MCHC  Latest Range: 30.0-36.0 g/dL  33.0   RDW  Latest Range: 11.5-15.5 %  17.3 (H)   Platelets  Latest Range: 150-400 K/uL  416 (H)    Review of Systems:  Constitutional  Poor appetite for several months. Reports early satiety.  Reports 20 pound weight loss  Cardiovascular  No chest pain.  SOB for several months Pulmonary  Reports fits of coughing since December 2014.Marland Kitchen  Reports dyspnea,unable to sleep without two pillows.  Coughing usually leads to emesis. Gastro Intestinal  No nausea, vomitting, or diarrhoea. No bright red blood per rectum, no abdominal pain.  Reports constipation.  Genito Urinary  No frequency, urgency, dysuria, no vaginal bleeding or discharge Musculo Skeletal  No myalgia, arthralgia, joint swelling or pain general fatigue Neurologic  No weakness, numbness, change in gait,  Psychology  No depression, anxiety, insomnia.   Current Meds:  No outpatient encounter prescriptions on file as of 08/03/2014.   Allergy:  Allergies   Allergen  Reactions   .  Penicillins     Social Hx:  Provides care to an invalid.    Past Surgical Hx:  Past Surgical History   Procedure  Laterality  Date   .  Cholecystectomy     .  Cardiac catheterization     .  Total abdominal hysterectomy   40 years ago     patient does not know if  ovaries were removed   .  Appendectomy     .  Total knee arthroplasty  Left     Past Medical Hx:  Past Medical History   Diagnosis  Date   .  GERD (gastroesophageal reflux disease)    .  Osteoarthritis    .  Peripheral edema    .  CHF (congestive heart failure), NYHA class IV      diastolic    Past Gynecological History: No LMP recorded. Patient has had a hysterectomy for menorrhagia.  G4P4  Hysterectomy approximately 40 years ago.   Family Hx:  Denies malignancies in any first degree relatives. Family History   Problem  Relation  Age of Onset   .  Family history unknown: Yes    Vitals: BP 103/57  Pulse 94  Temp(Src) 97 F (36.1 C) (Oral)  Resp 24  Ht 5\' 5"  (1.651 m)  Wt 154 lb 8 oz (70.081 kg)  BMI 25.71 kg/m2  SpO2 96%  Physical Exam:  WD in NAD cachectic and emaciated in appearance.  Slow speech. Neck  Supple NROM, without any enlargements.  Lymph Node Survey  No cervical supraclavicular or inguinal adenopathy  Cardiovascular  Pulse normal rate, regularity and rhythm. Lungs  Difficuly to assess.  Patient coughs with deep breaths.  With normal shallow inspiration breath sounds are indistinct. Left chest tube appreciated. Skin  No rash/lesions/breakdown  Psychiatry  Alert and oriented appropriate mood affect speech and reasoning.  Abdomen  Normoactive bowel sounds, abdomen soft, non-tender. Distended c/w ascites. Back  No CVA tenderness  Genito Urinary  Vulva/vagina: Normal external female genitalia. No lesions. No discharge or bleeding.  Bladder/urethra: No lesions or masses  Vagina: No palpable masses Adnexa: mass in the cul de sac soft, non mobile, no nodularity Rectal  Good tone, 6cm soft fixed mass in the cul de sac.  No nodularity  Extremities  1-2+ edema bilaterally   Janie Morning, MD, PhD

## 2014-08-04 NOTE — Brief Op Note (Signed)
07/27/2014 - 08/04/2014  9:36 AM  PATIENT:  [name]  79 y.o. female  PRE-OPERATIVE DIAGNOSIS: malignant pleural effusion- left  POST-OPERATIVE DIAGNOSIS:malignant L  pleural effusion  PROCEDURE:  Procedure(s): INSERTION PLEURAL DRAINAGE CATHETER (Left)  SURGEON:  Surgeon(s) and Role:    * Ivin Poot, MD - Primary  PHYSICIAN ASSISTANT:0   ASSISTANTS: none   ANESTHESIA:   local 1 % lidocaine  EBL:  Total I/O In: 200 [I.V.:200] Out: -   BLOOD ADMINISTERED:none  DRAINS:Pleurx- 2 liters drained  LOCAL MEDICATIONS USED:  LIDOCAINE  5 ml  SPECIMEN:  No Specimen  DISPOSITION OF SPECIMEN:  N/A  COUNTS:  YES  TOURNIQUET:  * No tourniquets in log *  DICTATION: .Dragon Dictation  PLAN OF CARE: Admit to inpatient transfer to [place]:  PACU - hemodynamically stable.   Delay start of Pharmacological VTE agent (>24hrs) due to surgical blood loss or risk of bleeding: yes

## 2014-08-04 NOTE — Progress Notes (Signed)
[facility] called for transport back to [facility] hospital

## 2014-08-04 NOTE — Transfer of Care (Signed)
Immediate Anesthesia Transfer of Care Note  Patient: [name]  Procedure(s) Performed: Procedure(s): INSERTION PLEURAL DRAINAGE CATHETER (Left)  Patient Location: PACU  Anesthesia Type:MAC  Level of Consciousness: awake, alert  and oriented  Airway & Oxygen Therapy: Patient Spontanous Breathing and Patient connected to nasal cannula oxygen  Post-op Assessment: Report given to PACU RN, Post -op Vital signs reviewed and stable and Patient moving all extremities  Post vital signs: Reviewed and stable  Complications: No apparent anesthesia complications

## 2014-08-04 NOTE — Progress Notes (Signed)
[facility] at bedside 

## 2014-08-04 NOTE — Anesthesia Preprocedure Evaluation (Signed)
Anesthesia Evaluation  Patient identified by MRN, date of birth, ID band Patient awake    Reviewed: Allergy & Precautions, H&P , NPO status , Patient's Chart, lab work & pertinent test results  Airway       Dental   Pulmonary          Cardiovascular +CHF     Neuro/Psych    GI/Hepatic GERD-  ,  Endo/Other    Renal/GU      Musculoskeletal   Abdominal   Peds  Hematology  (+) anemia ,   Anesthesia Other Findings Pleural effusion  Reproductive/Obstetrics                           Anesthesia Physical Anesthesia Plan  ASA: IV  Anesthesia Plan: MAC   Post-op Pain Management:    Induction: Intravenous  Airway Management Planned: Mask  Additional Equipment:   Intra-op Plan:   Post-operative Plan:   Informed Consent: I have reviewed the patients History and Physical, chart, labs and discussed the procedure including the risks, benefits and alternatives for the proposed anesthesia with the patient or authorized representative who has indicated his/her understanding and acceptance.     Plan Discussed with: CRNA, Anesthesiologist and Surgeon  Anesthesia Plan Comments:         Anesthesia Quick Evaluation

## 2014-08-04 NOTE — Progress Notes (Signed)
The patient was examined and preop studies reviewed. There has been no change from the prior exam and the patient is ready for surgery.  Plan Left Pleurx on Juline Patch today

## 2014-08-04 NOTE — Op Note (Signed)
NAME:  [name], [name] NO.:  [id number]  MEDICAL RECORD NO.:  [id number]  LOCATION:  [facility]                         FACILITY:  [facility]  PHYSICIAN:  Ivin Poot, M.D.  DATE OF BIRTH:  1935/08/07  DATE OF PROCEDURE:  08/04/2014 DATE OF DISCHARGE:                              OPERATIVE REPORT   OPERATION:  Placement of left PleurX catheter.  PREOPERATIVE DIAGNOSIS:  Recurrent malignant left pleural effusion, adenocarcinoma.  POSTOPERATIVE DIAGNOSIS:  Recurrent malignant left pleural effusion, adenocarcinoma.  SURGEON:  Ivin Poot, MD  ANESTHESIA:  Local with IV conscious monitored sedation.  INDICATIONS:  The patient is 79 year old.  She recently was diagnosed with malignant left pleural effusion with adenocarcinoma.  She has had recurrent pleural effusion despite several thoracenteses.  PleurX catheter was recommended and was felt to be appropriate for this patient who was living independently prior to her admission.  Prior to surgery, the patient was evaluated by Dr. Roxan Hockey and the procedure was discussed and informed consent was obtained.  I also saw the patient in preop holding prior to the operation and discussed the procedure, the reasons for the procedure, the alternatives to the procedure, and the potential risks of bleeding, infection, an  d recurrent infusion.  She demonstrated her understanding and agreed to proceed.  OPERATIVE PROCEDURE:  The patient was brought to the operating room and placed supine on the operating table.  The left chest was slightly bumped with soft rolled blanket.  The left chest and upper abdomen were prepped and draped as a sterile field.  A proper time-out was performed. A peripheral local 1% lidocaine was infiltrated in the anterior axillary line in the fifth interspace and in the midclavicular line at the costal margin.  A small incision was made in the upper area which was anesthetized and a wire was placed  in the pleural space under Seldinger technique.  Fluid drained out through the catheter.  The wire was evaluated with C-arm fluoroscopy in an appropriate position.  A second incision was made above the costal margin which had been anesthetized.  The catheter was then tunneled from the lower incision to the upper incision.  Next, over the guidewire the dilator and then the dilator with tear-away sheath was inserted in the pleural space which drained bloody fluid.  The guidewire was removed.  Through the tear-away sheath, the PleurX catheter was then advanced without resistance.  The tear-away sheath was completely removed.  The catheter was then connected to the suction vacuum bottles and 2 L of fluid was removed filling 2 complete bottles.  The upper incision was closed in layers using interrupted Vicryl for the subcutaneous and interrupted nylon for the skin.  The exit site was closed with a silk suture which was used to secure the catheter. Sterile dressings were applied.  A portable chest x-ray in the operating room showed the catheter to be good position, the left effusion was not completely drained, and there was no pneumothorax.  She returned to recovery room.     Ivin Poot, M.D.     PV/MEDQ  D:  08/04/2014  T:  08/04/2014  Job:  [id number]

## 2014-08-04 NOTE — Progress Notes (Signed)
PROGRESS NOTE    [name] JJK:[id number] DOB: October 12, 1935 DOA: 07/27/2014 PCP: [name], MD  HPI/Brief narrative 79 year old female patient with history of GERD, diastolic CHF, admitted to [facility] on 07/27/14 with worsening dyspnea and leg edema of 3-4 week duration. She was found to have large left pleural effusion and underwent thoracentesis x2-cytology showed metastatic adenocarcinoma suspicious for GYN/peritoneal primary. Oncologist at [facility] recommended transfer to [facility] for evaluation by GYN oncologist and cardiothoracic surgeon for possible pleurodesis.   Assessment/Plan:  1. Malignant left pleural effusion-likely from GYN primary: Status post 2 thoracentesis on 8/27 and 8/31. Thoracic surgery consulted and do not think that she is a good candidate for VATS with talc pleurodesis and hence placed Pleurx catheter on 08/04/14. On levofloxacin for presumed pneumonia-? Discontinue. GYN oncology input appreciated-state stage IV disease and patient opted for neoadjuvant chemotherapy. 2. Anemia: Secondary to chronic disease, malignancy and blood loss: No reported bleeding. Hemoglobin stable. 3. Hyperkalemia: Likely secondary to potassium supplements. DC potassium. Resolved after a dose of Kayexalate. 4. Hyponatremia: May be secondary to dehydration versus SIADH. Stable. Follow BMP. 5. Acute diastolic CHF: Compensated and may be even slightly dry. Cardiology signed off 9/2. 6. Mesenteric mass on CT: Management per oncology. 7. Stage IV GYN adenocarcinoma: Management as per GYN oncologist.   Code Status: DO NOT RESUSCITATE Family Communication: None at bedside. Disposition Plan: Possible DC home in next 1-2 days   Consultants:  Medical oncology  Cardiothoracic surgery  GYN oncology  Procedures:  Left Pleurx catheter placement 9/3  Antibiotics:  Oral levofloxacin 8/30 >   Subjective: Appropriate pain at Pleurx catheter site. Dyspnea  better. Denied other complaints.  Objective: Filed Vitals:   08/04/14 1000 08/04/14 1015 08/04/14 1030 08/04/14 1336  BP: 110/59 106/56 103/57 109/58  Pulse: 90 95 94 91  Temp:   97 F (36.1 C) 97.9 F (36.6 C)  TempSrc:    Oral  Resp: 19 22 24 20   Height:      Weight:      SpO2: 95% 96% 96% 97%    Intake/Output Summary (Last 24 hours) at 08/04/14 1738 Last data filed at 08/04/14 1300  Gross per 24 hour  Intake    930 ml  Output     75 ml  Net    855 ml   Filed Weights   08/02/14 0441 08/03/14 0209 08/04/14 0500  Weight: 69.1 kg (152 lb 5.4 oz) 68.4 kg (150 lb 12.7 oz) 70.081 kg (154 lb 8 oz)     Exam:  General exam: Frail elderly female lying comfortably propped up in bed. Respiratory system: Improved breath sounds left lung fields post Pleurx catheter placement and drainage of 2 L fluid this morning. Rest of the right lung fields clear to auscultation. No increased work of breathing. Cardiovascular system: S1 & S2 heard, RRR. No JVD, murmurs, gallops, clicks. Trace bilateral leg edema. Telemetry: Sinus rhythm Gastrointestinal system: Abdomen is nondistended, soft and nontender. Normal bowel sounds heard. Central nervous system: Alert and oriented. No focal neurological deficits. Extremities: Symmetric 5 x 5 power.   Data Reviewed: Basic Metabolic Panel:  Recent Labs Lab 07/31/14 0352 08/02/14 0605 08/03/14 1450 08/04/14 0015  NA 133* 133* 131* 132*  K 4.3 4.9 5.7* 5.0  CL 91* 95* 92* 94*  CO2 31 32 30 28  GLUCOSE 104* 91 105* 113*  BUN 21 32* 28* 26*  CREATININE 1.37* 1.09 0.84 0.89  CALCIUM 7.7* 7.8* 8.3* 8.2*  Liver Function Tests:  Recent Labs Lab 08/03/14 1450  AST 19  ALT 11  ALKPHOS 61  BILITOT 0.3  PROT 6.1  ALBUMIN 2.0*   No results found for this basename: LIPASE, AMYLASE,  in the last 168 hours No results found for this basename: AMMONIA,  in the last 168 hours CBC:  Recent Labs Lab 08/03/14 1450 08/04/14 0418  WBC 7.2 6.4    HGB 11.5* 10.7*  HCT 34.9* 32.9*  MCV 78.6 79.5  PLT 416* 385   Cardiac Enzymes:  Recent Labs Lab 07/31/14 0352  TROPONINI <0.30   BNP (last 3 results)  Recent Labs  07/27/14 1942  PROBNP 456.7*   CBG: No results found for this basename: GLUCAP,  in the last 168 hours  Recent Results (from the past 240 hour(s))  BODY FLUID CULTURE     Status: None   Collection Time    07/28/14 11:46 AM      Result Value Ref Range Status   Specimen Description FLUID PLEURAL LEFT   Final   Special Requests NONE   Final   Gram Stain     Final   Value: NO WBC SEEN     NO ORGANISMS SEEN     Performed at [facility]   Culture     Final   Value: NO GROWTH 3 DAYS     Performed at [facility]   Report Status 07/31/2014 FINAL   Final  SURGICAL PCR SCREEN     Status: None   Collection Time    08/03/14  3:44 PM      Result Value Ref Range Status   MRSA, PCR NEGATIVE  NEGATIVE Final   Staphylococcus aureus NEGATIVE  NEGATIVE Final   Comment:            The Xpert SA Assay (FDA     approved for NASAL specimens     in patients over 20 years of age),     is one component of     a comprehensive surveillance     program.  Test performance has     been validated by [employer] for patients greater     than or equal to 9 year old.     It is not intended     to diagnose infection nor to     guide or monitor treatment.         Studies: Dg Chest 2 View  08/03/2014   CLINICAL DATA:  Short of breath.  Followup pleural effusions  EXAM: CHEST  2 VIEW  COMPARISON:  08/01/2014  FINDINGS: There are bilateral pleural effusions identified, left greater than right. Compared with previous exam these appear similar in volume. Left pleural effusion appears to layer posteriorly which may reflect semi-upright technique. The heart border is obscured by pleural fluid.  IMPRESSION: 1. Similar volume of pleural effusions left greater night.   Electronically Signed   By: Kerby Moors  M.D.   On: 08/03/2014 13:47   Dg Chest Portable 1 View  08/04/2014   CLINICAL DATA:  Left pleural catheter insertion  EXAM: PORTABLE CHEST - 1 VIEW  COMPARISON:  08/03/2014  FINDINGS: Left basilar pleural catheter in satisfactory position with decrease in left effusion. There remains a small to moderate left effusion. No pneumothorax.  Improved aeration in the left lung base with decrease and left lower lobe atelectasis. Right lower lobe atelectasis unchanged.  IMPRESSION: Decreased left pleural effusion following placement of  pleural catheter. No pneumothorax. Improved aeration left lower lobe.   Electronically Signed   By: Franchot Gallo M.D.   On: 08/04/2014 09:33   Dg C-arm 1-60 Min-no Report  08/04/2014   CLINICAL DATA: pleurex catheter   C-ARM 1-60 MINUTES  Fluoroscopy was utilized by the requesting physician.  No radiographic  interpretation.         Scheduled Meds: . enoxaparin (LOVENOX) injection  40 mg Subcutaneous Q24H  . levofloxacin  500 mg Oral q1800  . pantoprazole  40 mg Oral q morning - 10a  . sodium chloride  3 mL Intravenous Q12H  . sodium chloride  3 mL Intravenous Q12H   Continuous Infusions:   Principal Problem:   Malignant pleural effusion Active Problems:   Edema of both legs   Heart failure, diastolic, with acute decompensation   Hyperkalemia   Anemia, unspecified    Time spent: 25 minutes    Shay Bartoli, MD, FACP, FHM. [facility] Pager [phone]  If 7PM-7AM, please contact night-coverage [email or web address] Password [email or web address] 08/04/2014, 5:38 PM    LOS: 8 days

## 2014-08-04 NOTE — Anesthesia Procedure Notes (Signed)
Procedure Name: MAC Date/Time: 08/04/2014 8:40 AM Performed by: Trixie Deis A Pre-anesthesia Checklist: Patient identified, Timeout performed, Emergency Drugs available, Suction available and Patient being monitored Oxygen Delivery Method: Simple face mask Placement Confirmation: positive ETCO2

## 2014-08-05 ENCOUNTER — Inpatient Hospital Stay (HOSPITAL_COMMUNITY): Payer: Medicare HMO

## 2014-08-05 ENCOUNTER — Encounter (HOSPITAL_COMMUNITY): Payer: Self-pay | Admitting: Cardiothoracic Surgery

## 2014-08-05 ENCOUNTER — Inpatient Hospital Stay
Admission: RE | Admit: 2014-08-05 | Discharge: 2014-08-12 | Disposition: A | Discharge status: Nursing Home (Includes ICF) | Payer: Medicare HMO | Source: Ambulatory Visit | Attending: Internal Medicine | Admitting: Internal Medicine

## 2014-08-05 DIAGNOSIS — I5033 Acute on chronic diastolic (congestive) heart failure: Secondary | ICD-10-CM

## 2014-08-05 LAB — BASIC METABOLIC PANEL
Anion gap: 9 (ref 5–15)
BUN: 20 mg/dL (ref 6–23)
CHLORIDE: 93 meq/L — AB (ref 96–112)
CO2: 31 meq/L (ref 19–32)
Calcium: 7.8 mg/dL — ABNORMAL LOW (ref 8.4–10.5)
Creatinine, Ser: 0.8 mg/dL (ref 0.50–1.10)
GFR calc Af Amer: 80 mL/min — ABNORMAL LOW (ref >90)
GFR calc non Af Amer: 69 mL/min — ABNORMAL LOW (ref >90)
Glucose, Bld: 111 mg/dL — ABNORMAL HIGH (ref 70–99)
POTASSIUM: 4 meq/L (ref 3.7–5.3)
SODIUM: 133 meq/L — AB (ref 137–147)

## 2014-08-05 LAB — CBC
HCT: 33.1 % — ABNORMAL LOW (ref 36.0–46.0)
Hemoglobin: 10.3 g/dL — ABNORMAL LOW (ref 12.0–15.0)
MCH: 25 pg — ABNORMAL LOW (ref 26.0–34.0)
MCHC: 31.1 g/dL (ref 30.0–36.0)
MCV: 80.3 fL (ref 78.0–100.0)
Platelets: 317 10*3/uL (ref 150–400)
RBC: 4.12 MIL/uL (ref 3.87–5.11)
RDW: 17.2 % — AB (ref 11.5–15.5)
WBC: 6 10*3/uL (ref 4.0–10.5)

## 2014-08-05 MED ORDER — GUAIFENESIN-DM 100-10 MG/5ML PO SYRP
5.0000 mL | ORAL_SOLUTION | ORAL | Status: DC | PRN
  Administered 2014-08-05: 5 mL via ORAL
  Filled 2014-08-05 (×2): qty 10

## 2014-08-05 MED ORDER — HYDROCODONE-ACETAMINOPHEN 5-325 MG PO TABS: 1.0000 | ORAL_TABLET | Freq: Four times a day (QID) | ORAL | Status: DC | PRN

## 2014-08-05 MED ORDER — GUAIFENESIN-DM 100-10 MG/5ML PO SYRP: 5.0000 mL | ORAL_SOLUTION | ORAL | Status: DC | PRN

## 2014-08-05 MED ORDER — LEVOFLOXACIN 500 MG PO TABS: 500.0000 mg | ORAL_TABLET | Freq: Every day | ORAL | Status: AC

## 2014-08-05 NOTE — Progress Notes (Signed)
Clinical Social Work Department BRIEF PSYCHOSOCIAL ASSESSMENT 08/05/2014  Patient:  [name], [name]     Account Number:  [id number]     Admit date:  07/27/2014  Clinical Social Worker:  Renold Genta  Date/Time:  08/05/2014 01:18 PM  Referred by:  Physician  Date Referred:  08/05/2014 Referred for  SNF Placement   Other Referral:   Interview type:  Patient Other interview type:   and family at bedside    PSYCHOSOCIAL DATA Living Status:  ALONE Admitted from facility:   Level of care:   Primary support name:  [name] (sister) ph#: [phone] Primary support relationship to patient:  SIBLING Degree of support available:   good    CURRENT CONCERNS Current Concerns  Post-Acute Placement   Other Concerns:    SOCIAL WORK ASSESSMENT / PLAN CSW received referral from [facility] that PT recommended SNF for patient now.   Assessment/plan status:  Information/Referral to [employer] Other assessment/ plan:   Information/referral to community resources:   CSW completed FL2 and faxed information out to [facility] SNFs - provided bed offers.    PATIENT'S/FAMILY'S RESPONSE TO PLAN OF CARE: Patient informed CSW that she had been to [facility] in the past following a knee replacement and worked there back in the day. CSW confirmed with [facility] that they would be able to take patient with a Letter of Guarantee pending [employer] authorization.       Raynaldo Opitz, [facility] Clinical Social Worker cell #: [phone]

## 2014-08-05 NOTE — Progress Notes (Signed)
Clinical Social Work Department CLINICAL SOCIAL WORK PLACEMENT NOTE 08/05/2014  Patient:  ILIA, DIMAANO  Account Number:  [id number] Admit date:  07/27/2014  Clinical Social Worker:  Renold Genta  Date/time:  08/05/2014 01:34 PM  Clinical Social Work is seeking post-discharge placement for this patient at the following level of care:   SKILLED NURSING   (*CSW will update this form in Epic as items are completed)   08/05/2014  Patient/family provided with [place] Department of Clinical Social Work's list of facilities offering this level of care within the geographic area requested by the patient (or if unable, by the patient's family).  08/05/2014  Patient/family informed of their freedom to choose among providers that offer the needed level of care, that participate in Medicare, Medicaid or managed care program needed by the patient, have an available bed and are willing to accept the patient.  08/05/2014  Patient/family informed of [facility]' ownership interest in [facility], as well as of the fact that they are under no obligation to receive care at this facility.  PASARR submitted to EDS on 08/05/2014 PASARR number received on 08/05/2014  FL2 transmitted to all facilities in geographic area requested by pt/family on  08/05/2014 FL2 transmitted to all facilities within larger geographic area on   Patient informed that his/her managed care company has contracts with or will negotiate with  certain facilities, including the following:     Patient/family informed of bed offers received:  08/05/2014 Patient chooses bed at [facility] Physician recommends and patient chooses bed at    Patient to be transferred to [facility] on   Patient to be transferred to facility by  Patient and family notified of transfer on  Name of family member notified:    The following physician request were entered in Epic:   Additional Comments:   Raynaldo Opitz, [place] Social Worker cell #: [phone]

## 2014-08-05 NOTE — Progress Notes (Signed)
Spoke with Dr. Prescott Gum nurse Eulis Foster), per MD to drain Left Pleurex Cath prior to discharge and Drain Monday - Wednesday and Friday in SNF.

## 2014-08-05 NOTE — Discharge Summary (Addendum)
Physician Discharge Summary  THEDORA RINGS GGE:[id number] DOB: 1935-11-27 DOA: 07/27/2014  PCP: [name], MD  Admit date: 07/27/2014 Discharge date: 08/05/2014  Time spent: Greater than 30 minutes  Recommendations for Outpatient Follow-up:  1. Dr. Evlyn Clines, Oncology in 2 weeks. Dr. Dwain Sarna office is going to coordinate chemotherapy with Dr. Marko Plume at the [facility]. 2. Dr. Tharon Aquas Trigt, TCTS in 3 weeks. 3. Dr. Sinda Du, PCP upon discharge from SNF. 4. MD at SNF in 5 days with repeat labs (CBC & BMP) & chest x-ray 5. Drain left Pleurx catheter on Mondays, Wednesdays and Fridays.  Discharge Diagnoses:  Principal Problem:   Malignant pleural effusion Active Problems:   Edema of both legs   Heart failure, diastolic, with acute decompensation   Hyperkalemia   Anemia, unspecified   Discharge Condition: Improved & Stable  Diet recommendation: Regular diet  Filed Weights   08/03/14 0209 08/04/14 0500 08/05/14 0539  Weight: 68.4 kg (150 lb 12.7 oz) 70.081 kg (154 lb 8 oz) 65.5 kg (144 lb 6.4 oz)    History of present illness:  79 year old female patient with history of GERD, diastolic CHF, admitted to [facility] on 07/27/14 with worsening dyspnea and leg edema of 3-4 week duration. She was found to have large left pleural effusion and underwent thoracentesis x2-cytology showed metastatic adenocarcinoma suspicious for GYN/peritoneal primary. Oncologist at [facility] recommended transfer to [facility] long hospital for evaluation by GYN oncologist and cardiothoracic surgeon for possible pleurodesis  Hospital Course:   1. Malignant left pleural effusion-likely from GYN primary: Status post 2 thoracentesis on 8/27 and 8/31. Thoracic surgery consulted and do not think that she is a good candidate for VATS with talc pleurodesis and hence placed Pleurx catheter on 08/04/14. On levofloxacin for presumed pneumonia-complete total 7 days course. GYN oncology input  appreciated-state stage IV disease and patient opted for neoadjuvant chemotherapy. Discussed with thoracic surgery who have cleared her for discharge to SNF, recommend left Pleurx drainage as above and followup with them outpatient in 3 weeks. 2. Anemia: Secondary to chronic disease, malignancy and blood loss: No reported bleeding. Hemoglobin stable. 3. Hyperkalemia: Likely secondary to potassium supplements. DC potassium. Resolved after a dose of Kayexalate. 4. Hyponatremia: May be secondary to dehydration versus SIADH. Stable.  5. Acute diastolic CHF: Compensated. Cardiology signed off 9/2. Stable off diuretics. Closely monitor. 6. Mesenteric mass on CT: Management per oncology. 7. Stage IV GYN adenocarcinoma: Management as per GYN oncologist. Discussed with Ms. Joylene John, GYN oncology NP with Dr. Skeet Latch who has cleared patient for discharge to SNF and she will coordinate chemotherapy with Dr. Marko Plume at the [facility]. 8. DO NOT RESUSCITATE  Consultants:  Medical oncology  Cardiothoracic surgery  GYN oncology  Procedures:  Left Pleurx catheter placement 9/3 Thoracentesis x2 on 8/27 & 8/31  Discharge Exam:  Complaints:  Dyspnea improved. No significant pain. Intermittent mild dry cough.  Filed Vitals:   08/04/14 1030 08/04/14 1336 08/04/14 1952 08/05/14 0539  BP: 103/57 109/58 106/57 104/57  Pulse: 94 91 97 93  Temp: 97 F (36.1 C) 97.9 F (36.6 C) 98.3 F (36.8 C) 97.7 F (36.5 C)  TempSrc:  Oral Oral Oral  Resp: 24 20 20 20   Height:      Weight:    65.5 kg (144 lb 6.4 oz)  SpO2: 96% 97% 95% 95%   General exam: Frail elderly female lying comfortably propped up in bed.  Respiratory system: Improved breath sounds left lung fields  post Pleurx catheter placement. Rest of the right lung fields clear to auscultation. No increased work of breathing.  Cardiovascular system: S1 & S2 heard, RRR. No JVD, murmurs, gallops, clicks. Trace bilateral leg edema. Telemetry: Sinus  rhythm  Gastrointestinal system: Abdomen is nondistended, soft and nontender. Normal bowel sounds heard.  Central nervous system: Alert and oriented. No focal neurological deficits.  Extremities: Symmetric 5 x 5 power.   Discharge Instructions      Discharge Instructions   Call MD for:  difficulty breathing, headache or visual disturbances    Complete by:  As directed      Call MD for:  severe uncontrolled pain    Complete by:  As directed      Diet general    Complete by:  As directed      Discharge instructions    Complete by:  As directed   Drain left pleurx catheter every Mon/Wed/Fri.     Increase activity slowly    Complete by:  As directed             Medication List         guaiFENesin-dextromethorphan 100-10 MG/5ML syrup  Commonly known as:  ROBITUSSIN DM  Take 5 mLs by mouth every 4 (four) hours as needed for cough.     HYDROcodone-acetaminophen 5-325 MG per tablet  Commonly known as:  NORCO/VICODIN  Take 1 tablet by mouth every 6 (six) hours as needed for moderate pain or severe pain.     levofloxacin 500 MG tablet  Commonly known as:  LEVAQUIN  Take 1 tablet (500 mg total) by mouth daily at 6 PM. DC after 08/07/2014 dose.       Follow-up Information   Follow up with LIVESAY,LENNIS P, MD. Schedule an appointment as soon as possible for a visit in 2 weeks.   Specialty:  Oncology   Contact information:   [address] [place] [address] [phone]       Follow up with VAN Wilber Oliphant, MD. Schedule an appointment as soon as possible for a visit in 3 weeks.   Specialty:  Cardiothoracic Surgery   Contact information:   [address] [place] [address] [phone]       Schedule an appointment as soon as possible for a visit with HAWKINS,EDWARD L, MD. (Upon DC from SNF.)    Specialty:  Pulmonary Disease   Contact information:   [address] [place] [address] [phone]       Follow up with MD  at SNF. Schedule an appointment as soon as possible for a visit in 5 days. (To be seen with repeat labs (CBC & BMP) & CXR.)        The results of significant diagnostics from this hospitalization (including imaging, microbiology, ancillary and laboratory) are listed below for reference.    Significant Diagnostic Studies: Dg Chest 1 View  08/01/2014   CLINICAL DATA:  Post LEFT thoracentesis  EXAM: CHEST - 1 VIEW  COMPARISON:  07/30/2014  FINDINGS: Decrease in LEFT pleural effusion post thoracentesis.  Moderate persistent LEFT pleural effusion and LEFT basilar atelectasis remain.  Heart appears enlarged.  Minimal RIGHT base atelectasis.  No pneumothorax following thoracentesis.  IMPRESSION: Decrease in LEFT pleural effusion and atelectasis post thoracentesis without evidence of postprocedural pneumothorax.  Enlargement of cardiac silhouette with minimal RIGHT basilar atelectasis.   Electronically Signed   By: Lavonia Dana M.D.   On: 08/01/2014 12:53   Dg  Chest 1 View  07/28/2014   CLINICAL DATA:  LEFT pleural effusion post thoracentesis  EXAM: CHEST - 1 VIEW  COMPARISON:  07/27/2014  FINDINGS: Persistent large LEFT pleural effusion despite interval removal of 1.5 L of fluid from the LEFT chest.  No pneumothorax.  Heart remains enlarged.  Minimal RIGHT basilar atelectasis.  Bones demineralized.  IMPRESSION: Persistent large LEFT pleural effusion and basilar opacification despite removal 1.5 L of fluid from the LEFT chest.  No pneumothorax post thoracentesis.   Electronically Signed   By: Lavonia Dana M.D.   On: 07/28/2014 12:01   Dg Chest 2 View  08/03/2014   CLINICAL DATA:  Short of breath.  Followup pleural effusions  EXAM: CHEST  2 VIEW  COMPARISON:  08/01/2014  FINDINGS: There are bilateral pleural effusions identified, left greater than right. Compared with previous exam these appear similar in volume. Left pleural effusion appears to layer posteriorly which may reflect semi-upright technique. The  heart border is obscured by pleural fluid.  IMPRESSION: 1. Similar volume of pleural effusions left greater night.   Electronically Signed   By: Kerby Moors M.D.   On: 08/03/2014 13:47   Ct Chest W Contrast  07/29/2014   CLINICAL DATA:  79 year old female with shortness of breath, chest, abdominal and pelvic pain and pleural effusion.  EXAM: CT CHEST, ABDOMEN, AND PELVIS WITH CONTRAST  TECHNIQUE: Multidetector CT imaging of the chest, abdomen and pelvis was performed following the standard protocol during bolus administration of intravenous contrast.  CONTRAST:  154mL OMNIPAQUE IOHEXOL 300 MG/ML  SOLN  COMPARISON:  None.  FINDINGS: CT CHEST FINDINGS  The heart and great vessels are within normal limits.  A large left pleural effusion and very small right pleural effusion are noted with bilateral lower lung atelectasis, left greater than right.  A tiny pleural pericardial effusion is noted.  There is no evidence of pleural mass or definite thickening.  There is no evidence of airspace disease, definite mass, nodule, or endobronchial/ endotracheal lesion.  No enlarged lymph nodes identified.  Mild subcutaneous edema is present.  No acute or suspicious bony abnormalities are noted.  CT ABDOMEN AND PELVIS FINDINGS  Moderate ascites is present.  Medial left/ anterior right liver scarring is noted. No focal hepatic abnormalities are otherwise identified.  Spleen, pancreas, adrenal glands and kidneys are unremarkable except for bilateral renal cortical atrophy. Patient is status post cholecystectomy.  There is no evidence of biliary dilatation, enlarged lymph nodes or abdominal aortic aneurysm.  Extensive descending and sigmoid colonic diverticulosis noted without definite diverticulitis.  There is no evidence of bowel obstruction, peritoneal thickening or nodularity or pneumoperitoneum.  Ill-defined increased density measuring 3 x 8 cm within the mesenteric is nonspecific.  No acute or suspicious bony abnormalities  are identified.  IMPRESSION: Large left pleural effusion, moderate ascites, diffuse subcutaneous edema and small right pleural effusion. Associated bilateral lower lung atelectasis.  3 x 8 cm ill-defined density within the mesentery - solid mass/neoplasm is not excluded. Consider PET-CT for further evaluation as clinically indicated.  Bilateral renal cortical atrophy and colonic diverticulosis.   Electronically Signed   By: Hassan Rowan M.D.   On: 07/29/2014 12:07   Ct Abdomen Pelvis W Contrast  07/29/2014   CLINICAL DATA:  79 year old female with shortness of breath, chest, abdominal and pelvic pain and pleural effusion.  EXAM: CT CHEST, ABDOMEN, AND PELVIS WITH CONTRAST  TECHNIQUE: Multidetector CT imaging of the chest, abdomen and pelvis was performed following the standard protocol during  bolus administration of intravenous contrast.  CONTRAST:  166mL OMNIPAQUE IOHEXOL 300 MG/ML  SOLN  COMPARISON:  None.  FINDINGS: CT CHEST FINDINGS  The heart and great vessels are within normal limits.  A large left pleural effusion and very small right pleural effusion are noted with bilateral lower lung atelectasis, left greater than right.  A tiny pleural pericardial effusion is noted.  There is no evidence of pleural mass or definite thickening.  There is no evidence of airspace disease, definite mass, nodule, or endobronchial/ endotracheal lesion.  No enlarged lymph nodes identified.  Mild subcutaneous edema is present.  No acute or suspicious bony abnormalities are noted.  CT ABDOMEN AND PELVIS FINDINGS  Moderate ascites is present.  Medial left/ anterior right liver scarring is noted. No focal hepatic abnormalities are otherwise identified.  Spleen, pancreas, adrenal glands and kidneys are unremarkable except for bilateral renal cortical atrophy. Patient is status post cholecystectomy.  There is no evidence of biliary dilatation, enlarged lymph nodes or abdominal aortic aneurysm.  Extensive descending and sigmoid colonic  diverticulosis noted without definite diverticulitis.  There is no evidence of bowel obstruction, peritoneal thickening or nodularity or pneumoperitoneum.  Ill-defined increased density measuring 3 x 8 cm within the mesenteric is nonspecific.  No acute or suspicious bony abnormalities are identified.  IMPRESSION: Large left pleural effusion, moderate ascites, diffuse subcutaneous edema and small right pleural effusion. Associated bilateral lower lung atelectasis.  3 x 8 cm ill-defined density within the mesentery - solid mass/neoplasm is not excluded. Consider PET-CT for further evaluation as clinically indicated.  Bilateral renal cortical atrophy and colonic diverticulosis.   Electronically Signed   By: Hassan Rowan M.D.   On: 07/29/2014 12:07   Dg Chest Port 1 View  08/05/2014   CLINICAL DATA:  Pleural effusion and left PleurX catheter  EXAM: PORTABLE CHEST - 1 VIEW  COMPARISON:  Portable chest x-ray of August 04, 2014  FINDINGS: A small left pleural effusion persists. The PleurX catheter tip lies medially in the left hemi thorax at the eighth and ninth rib interspace is posteriorly. On the right there is a small effusion and basilar atelectasis. The cardiac silhouette is normal where visualized. The aortic arch is obscured  IMPRESSION: Stable small left pleural effusion with PleurX catheter in position as described. Increased density at the right lung base is consistent with small pleural effusion and interval development of atelectasis.   Electronically Signed   By: David  Martinique   On: 08/05/2014 07:19   Dg Chest Portable 1 View  08/04/2014   CLINICAL DATA:  Left pleural catheter insertion  EXAM: PORTABLE CHEST - 1 VIEW  COMPARISON:  08/03/2014  FINDINGS: Left basilar pleural catheter in satisfactory position with decrease in left effusion. There remains a small to moderate left effusion. No pneumothorax.  Improved aeration in the left lung base with decrease and left lower lobe atelectasis. Right lower lobe  atelectasis unchanged.  IMPRESSION: Decreased left pleural effusion following placement of pleural catheter. No pneumothorax. Improved aeration left lower lobe.   Electronically Signed   By: Franchot Gallo M.D.   On: 08/04/2014 09:33   Dg Chest Port 1 View  07/30/2014   CLINICAL DATA:  Worsening chest pain. Prior left thoracentesis earlier this week.  EXAM: PORTABLE CHEST - 1 VIEW  COMPARISON:  02/25/2014  FINDINGS: Continued large left pleural effusion, likely slightly increased since prior study. Increasing airspace disease in the aerated left upper lobe. No pneumothorax. Cardiomegaly. No confluent opacity on the right.  IMPRESSION: Continued large left pleural effusion, likely slightly increased. Airspace disease now throughout the aerated left upper lobe.  Cardiomegaly.   Electronically Signed   By: Rolm Baptise M.D.   On: 07/30/2014 18:35   Dg Chest Port 1 View  07/27/2014   CLINICAL DATA:  Shortness of breath tonight.  EXAM: PORTABLE CHEST - 1 VIEW  COMPARISON:  11/05/2012.  FINDINGS: Interval large left pleural effusion. Mild adjacent left lung atelectasis. The right lung is clear. The heart size is difficult to assess due to the obscuration of the left heart borders. There is some increased mediastinal shift to the right. Minimal right pleural effusion. Diffuse osteopenia.  IMPRESSION: 1. Interval large left pleural effusion with mild adjacent left lung atelectasis. There is associated increased mediastinal shift to the right. 2. Minimal right pleural effusion.   Electronically Signed   By: Enrique Sack M.D.   On: 07/27/2014 19:36   Dg C-arm 1-60 Min-no Report  08/04/2014   CLINICAL DATA: pleurex catheter   C-ARM 1-60 MINUTES  Fluoroscopy was utilized by the requesting physician.  No radiographic  interpretation.    US Thoracentesis Asp Pleural Space W/img Guide  08/01/2014   CLINICAL DATA:  Persistent LEFT pleural effusion  EXAM: US THORACENTESIS ASP PLEURAL SPACE W/IMG GUIDE  TECHNIQUE:  Procedure, benefits, and risks of procedure were discussed with patient.  Written informed consent for procedure was obtained.  Time out protocol followed.  Pleural effusion localized by ultrasound at the posterior LEFT hemithorax.  Skin prepped and draped in usual sterile fashion.  Skin and soft tissues anesthetized with 8 mL of 1% lidocaine.  8 French thoracentesis catheter placed into the LEFT pleural space.  1500 mL of dark old appearing bloody fluid aspirated by syringe pump.  Procedure tolerated well by patient without immediate complication.  Fluid sent to laboratory for requested analysis.  COMPARISON:  07/28/2014  FINDINGS: As above  IMPRESSION: Ultrasound-guided thoracentesis of the LEFT pleural cavity with removal of 1500 mL of dark old appearing bloody fluid.   Electronically Signed   By: Lavonia Dana M.D.   On: 08/01/2014 12:49   US Thoracentesis Asp Pleural Space W/img Guide  07/28/2014   CLINICAL DATA:  Large LEFT pleural effusion  EXAM: US THORACENTESIS ASP PLEURAL SPACE W/IMG GUIDE  TECHNIQUE: Procedure, benefits, and risks of procedure were discussed with patient.  Written informed consent for procedure was obtained.  Time out protocol followed.  Pleural effusion localized by ultrasound at the posterior LEFT hemithorax.  Skin prepped and draped in usual sterile fashion.  Skin and soft tissues anesthetized with 8 mL of 1% lidocaine.  8 French thoracentesis catheter placed into the LEFT pleural space.  1500 mL of serosanguineous fluid aspirated by syringe pump.  Procedure tolerated well by patient without immediate complication.  Fluid sample of 180 mL was sent to laboratory for requested analysis.  COMPARISON:  None ; correlation chest radiograph 07/27/2014  FINDINGS: As above  IMPRESSION: Removal of 1500 mL of serosanguineous fluid from the LEFT pleural space by ultrasound guided thoracentesis.   Electronically Signed   By: Lavonia Dana M.D.   On: 07/28/2014 12:46    Microbiology: Recent  Results (from the past 240 hour(s))  BODY FLUID CULTURE     Status: None   Collection Time    07/28/14 11:46 AM      Result Value Ref Range Status   Specimen Description FLUID PLEURAL LEFT   Final   Special Requests NONE   Final  Gram Stain     Final   Value: NO WBC SEEN     NO ORGANISMS SEEN     Performed at [facility]   Culture     Final   Value: NO GROWTH 3 DAYS     Performed at [facility]   Report Status 07/31/2014 FINAL   Final  SURGICAL PCR SCREEN     Status: None   Collection Time    08/03/14  3:44 PM      Result Value Ref Range Status   MRSA, PCR NEGATIVE  NEGATIVE Final   Staphylococcus aureus NEGATIVE  NEGATIVE Final   Comment:            The Xpert SA Assay (FDA     approved for NASAL specimens     in patients over 73 years of age),     is one component of     a comprehensive surveillance     program.  Test performance has     been validated by [employer] for patients greater     than or equal to 32 year old.     It is not intended     to diagnose infection nor to     guide or monitor treatment.     Labs: Basic Metabolic Panel:  Recent Labs Lab 07/31/14 0352 08/02/14 0605 08/03/14 1450 08/04/14 0015 08/05/14 0410  NA 133* 133* 131* 132* 133*  K 4.3 4.9 5.7* 5.0 4.0  CL 91* 95* 92* 94* 93*  CO2 31 32 30 28 31   GLUCOSE 104* 91 105* 113* 111*  BUN 21 32* 28* 26* 20  CREATININE 1.37* 1.09 0.84 0.89 0.80  CALCIUM 7.7* 7.8* 8.3* 8.2* 7.8*   Liver Function Tests:  Recent Labs Lab 08/03/14 1450  AST 19  ALT 11  ALKPHOS 61  BILITOT 0.3  PROT 6.1  ALBUMIN 2.0*   No results found for this basename: LIPASE, AMYLASE,  in the last 168 hours No results found for this basename: AMMONIA,  in the last 168 hours CBC:  Recent Labs Lab 08/03/14 1450 08/04/14 0418 08/05/14 0410  WBC 7.2 6.4 6.0  HGB 11.5* 10.7* 10.3*  HCT 34.9* 32.9* 33.1*  MCV 78.6 79.5 80.3  PLT 416* 385 317   Cardiac Enzymes:  Recent Labs Lab  07/31/14 0352  TROPONINI <0.30   BNP: BNP (last 3 results)  Recent Labs  07/27/14 1942  PROBNP 456.7*   CBG: No results found for this basename: GLUCAP,  in the last 168 hours  Additional labs: 1. ABG on 07/30/14: PH 7.403, PCO2 53, PO2 84, bicarbonate 32 and oxygen saturation 96% on oxygen via nasal cannula 2. Anemia panel: Iron 17, TIBC 209, saturation ratios 8, ferritin 166, folate 4.5 and vitamin B12: 429. Reticulocyte count 73 3. CA 19-9:10.8, CA 27.29:105 & CEA: 1.5 & 1.6 4. 2-D echo 07/28/14: Study Conclusions  - Procedure narrative: Technically difficult study. - Left ventricle: The cavity size was normal. Systolic function was normal. The estimated ejection fraction was in the range of 60% to 65%. Doppler parameters are consistent with abnormal left ventricular relaxation (grade 1 diastolic dysfunction). Indeterminate LV filling pressures. Moderate posterior wall and severe focal basal septal hypertrophy. - Aortic valve: Mildly to moderately calcified annulus. Mildly thickened leaflets. - Mitral valve: Mildly calcified annulus. Mildly thickened leaflets . - Pericardium, extracardiac: Large left pleural effusion noted. 5. Pathology 08/01/14: Diagnosis PLEURAL FLUID, LEFT (SPECIMEN 1 OF 1 COLLECTED  08/01/14): MALIGNANT CELLS CONSISTENT WITH METASTATIC ADENOCARCINOMA. 6. Pathology 07/28/14: ADDITIONAL INFORMATION: The malignant cells show the following immunoprofile: CK7: positive CK20: negative WT-1: positive ER: weakly positive PR-negative TTF-1: negative GCDFP-15: negative Controls are appropriate. The findings are diagnostic for metastatic adenocarcinoma and favor gynecologic/ovarian primary. Clinical and radiographic correlation is recommended. Aldona Bar MD Pathologist, Electronic Signature ( Signed 08/02/2014) Adequacy Reason Satisfactory For Evaluation. Diagnosis PLEURAL FLUID, LEFT(SPECIMEN 1 OF 1 COLLECTED 07/28/14): MALIGNANT CELLS CONSISTENT  WITH METASTATIC ADENOCARCINOMA. PLEASE SEE COMMENT.   Discussed with patient's sister, son and rest of family at bedside.  Signed:  Vernell Leep, MD, FACP, FHM. [facility] Pager [phone]  If 7PM-7AM, please contact night-coverage [email or web address] Password [email or web address] 08/05/2014, 1:48 PM

## 2014-08-05 NOTE — Progress Notes (Signed)
Dr. Prescott Gum office contacted to get orders on Pleurx care and management for discharge purposes. Pt had pleurx placed on yesterday. Question if patient needed to drain pleurx today since was placed on yesterday.

## 2014-08-05 NOTE — Evaluation (Signed)
Physical Therapy Evaluation Patient Details Name: [name] MRN: [id number] DOB: January 11, 1935 Today's Date: 08/05/2014   History of Present Illness  Pt is a 79 year old female patient with history of GERD, diastolic CHF, admitted to [facility] on 07/27/14 with worsening dyspnea and leg edema.  She was found to have large left pleural effusion and underwent thoracentesis x2-cytology showed metastatic adenocarcinoma suspicious for GYN/peritoneal primary.  Pt transferred to [facility] for L pleurx catheter 9/3 then to [facility] after procedure for GYN oncology.  Clinical Impression  Pt currently with functional limitations due to the deficits listed below (see PT Problem List).  Pt will benefit from skilled PT to increase their independence and safety with mobility to allow discharge to the venue listed below.  Pt presents with decreased strength and endurance.  Pt would benefit from ST-SNF prior to home.     Follow Up Recommendations SNF    Equipment Recommendations  Rolling walker with 5" wheels    Recommendations for Other Services       Precautions / Restrictions Precautions Precautions: Fall Precaution Comments: L pleurx catheter      Mobility  Bed Mobility Overal bed mobility: Needs Assistance Bed Mobility: Supine to Sit     Supine to sit: Min assist;HOB elevated     General bed mobility comments: verbal cues for technique, increased time due to pleurx site pain, assist for trunk upright  Transfers Overall transfer level: Needs assistance Equipment used: Rolling walker (2 wheeled) Transfers: Sit to/from Stand Sit to Stand: Min assist         General transfer comment: verbal cues for safe technique esp hand placement  Ambulation/Gait Ambulation/Gait assistance: Min assist Ambulation Distance (Feet): 35 Feet Assistive device: Rolling walker (2 wheeled) Gait Pattern/deviations: Step-through pattern;Decreased stride length;Trunk flexed Gait velocity: decr   General Gait  Details: verbal cues for use of RW, fatigues very quickly, ambulated on 3L O2  Stairs            Wheelchair Mobility    Modified Rankin (Stroke Patients Only)       Balance                                             Pertinent Vitals/Pain Pain Assessment: 0-10 Pain Score: 3  Pain Location: L pleurx site Pain Descriptors / Indicators: Sore Pain Intervention(s): Monitored during session;Repositioned    Home Living Family/patient expects to be discharged to:: Private residence Living Arrangements: Alone   Type of Home: House Home Access: Stairs to enter   Technical brewer of Steps: 3 Home Layout: One level Home Equipment: Cane - single point      Prior Function Level of Independence: Independent               Hand Dominance        Extremity/Trunk Assessment   Upper Extremity Assessment: Generalized weakness           Lower Extremity Assessment: Generalized weakness         Communication   Communication: No difficulties  Cognition Arousal/Alertness: Awake/alert Behavior During Therapy: WFL for tasks assessed/performed Overall Cognitive Status: Within Functional Limits for tasks assessed                      General Comments      Exercises        Assessment/Plan  PT Assessment Patient needs continued PT services  PT Diagnosis Difficulty walking;Generalized weakness   PT Problem List Decreased strength;Decreased activity tolerance;Decreased mobility;Cardiopulmonary status limiting activity;Decreased knowledge of use of DME;Pain  PT Treatment Interventions Gait training;DME instruction;Functional mobility training;Patient/family education;Therapeutic activities;Therapeutic exercise   PT Goals (Current goals can be found in the Care Plan section) Acute Rehab PT Goals PT Goal Formulation: With patient/family Time For Goal Achievement: 08/12/14 Potential to Achieve Goals: Good    Frequency Min  3X/week   Barriers to discharge        Co-evaluation               End of Session Equipment Utilized During Treatment: Gait belt;Oxygen Activity Tolerance: Patient limited by fatigue Patient left: in chair;with call bell/phone within reach;with family/visitor present Nurse Communication: Mobility status         Time: 1696-7893 PT Time Calculation (min): 19 min   Charges:   PT Evaluation $Initial PT Evaluation Tier I: 1 Procedure PT Treatments $Gait Training: 8-22 mins   PT G Codes:          Akasia Ahmad,KATHrine E 08/05/2014, 10:58 AM Carmelia Bake, PT, DPT 08/05/2014 Pager: [phone]

## 2014-08-05 NOTE — Progress Notes (Signed)
Patient is set to discharge to [facility] SN[facility] today. Patient & family at bedside aware. Discharge packet in [place], [place] aware.    Clinical Social Work Department CLINICAL SOCIAL WORK PLACEMENT NOTE 08/05/2014  Patient:  [name], [name]  Account Number:  [id number] Admit date:  07/27/2014  Clinical Social Worker:  Renold Genta  Date/time:  08/05/2014 01:34 PM  Clinical Social Work is seeking post-discharge placement for this patient at the following level of care:   SKILLED NURSING   (*CSW will update this form in Epic as items are completed)   08/05/2014  Patient/family provided with [place] Department of Clinical Social Work's list of facilities offering this level of care within the geographic area requested by the patient (or if unable, by the patient's family).  08/05/2014  Patient/family informed of their freedom to choose among providers that offer the needed level of care, that participate in Medicare, Medicaid or managed care program needed by the patient, have an available bed and are willing to accept the patient.  08/05/2014  Patient/family informed of [facility]' ownership interest in [facility], as well as of the fact that they are under no obligation to receive care at this facility.  PASARR submitted to EDS on 08/05/2014 PASARR number received on 08/05/2014  FL2 transmitted to all facilities in geographic area requested by pt/family on  08/05/2014 FL2 transmitted to all facilities within larger geographic area on   Patient informed that his/her managed care company has contracts with or will negotiate with  certain facilities, including the following:     Patient/family informed of bed offers received:  08/05/2014 Patient chooses bed at [facility] Physician recommends and patient chooses bed at    Patient to be transferred to [facility] on  08/05/2014 Patient to be transferred to facility by [facility] Patient and  family notified of transfer on 08/05/2014 Name of family member notified:  patient's family at bedside  The following physician request were entered in Epic:   Additional Comments:     Raynaldo Opitz, [place] Social Worker cell #: [phone]

## 2014-08-05 NOTE — Progress Notes (Signed)
Report given to Vickie at [facility].

## 2014-08-07 ENCOUNTER — Non-Acute Institutional Stay (SKILLED_NURSING_FACILITY): Payer: Medicare HMO | Admitting: Internal Medicine

## 2014-08-07 DIAGNOSIS — I5033 Acute on chronic diastolic (congestive) heart failure: Secondary | ICD-10-CM

## 2014-08-07 DIAGNOSIS — J910 Malignant pleural effusion: Secondary | ICD-10-CM

## 2014-08-07 DIAGNOSIS — C579 Malignant neoplasm of female genital organ, unspecified: Secondary | ICD-10-CM

## 2014-08-07 DIAGNOSIS — D649 Anemia, unspecified: Secondary | ICD-10-CM

## 2014-08-07 NOTE — Progress Notes (Signed)
Patient ID: [name], female   DOB: [birth year], 79 y.o.   MRN: [id number]   catheter placed-also is on Levaquin for possible pneumonia.  GYN oncology evaluated patient and concluded this was stage IV disease-patient opted for needle adjunctive chemotherapy this is an acute visit.  Local care skilled.  Facility [facility].  Chief complaint-acute visit status post hospitalization for malignant pleural effusion.  History of present illness.  Patient is a very pleasant 79 year old female with a history of diastolic CHF and GERD who is admitted to the hospital with worsening dyspnea and leg edema-she was found to have a large left pleural effusion and underwent thora centesis x2-cytology showed metastatic adenocarcinoma suspicious for a GYN source  She did have a catheter placed-also was placed on Levaquin for presumed pneumonia.  It was thought she had stage IV disease-she has been discharged skilled nursing with recommended left Pleurx drainage.  Patient also had hyperkalemia thought secondary to potassium supplementation potassium was DC'd and she was given a dose of Kayexalate.  She also has a history of acute diastolic CHF-she has been stable off her diuretics but will need monitoring.  In regards to her stage IV GYN adenocarcinoma-she will continue chemotherapy this will be followed at the [facility].  Currently she does complain at times of some pain especially when she takes a deep breath--she says this is not really new but the Norco does not last long enough when she takes it when necessary.  Family medical social history as been reviewed per discharge note on 08/05/2014.  Medications have been reviewed per [facility] in these are quite minimal essentially Robitussin every 4 hours-Norco every 6 hours when necessary-and Levaquin which she is completing today.  Review of systems.  In general does not complain of fever chills does complain weak.  Skin does not complaining of rashes or  itching.  Respiratory does not complaining of acute shortness of breath but does say it hurts sometimes when she takes a deep breath --does have a cough.  Cardiac is not complaining of chest pain appears to have fairly minimal edema does have a history of diastolic CHF.  GI-is not complaining of abdominal discomfort nausea or vomiting or constipation.  Muscle skeletal again does complain of some pain when she takes a deep breath but does not complaining specifically of any joint pain.  Neurologic does not complaining of dizziness or headache or syncopal-type feelings.  Psych is not complaining of anxiety or depression-.  Physical exam.  Temperature is 99.1 pulse 74 respirations 20 blood pressure 113/55 O2 saturation has been in the 90s.  In general this is a frail elderly female in no distress lying comfortably in bed.  Chest is clear to auscultation with some shallow air entry more so on the left-- there is no labored breathing--however with any attempt at deep inspiration she does cough--- catheter is in place left thorax.  Heart is regular rate and rhythm without murmur gallop or rub she has trace lower extremity edema.  Abdomen is soft does not appear to be tender there are positive bowel sounds.  Muscle skeletal has generalized weakness but able to move all extremities x4.  Neurologic-appears grossly intact I do not see any lateralizing findings  Psych she is alert and oriented x3 pleasant and appropriate  Labs.  08/05/2014.  Sodium 133 potassium 4.0 had been as high as 5.7 previously-BUN 20 creatinine 0.8 CO2 31.  WBC 6.0 hemoglobin 10.3 platelets 317. 08/03/2014  08/03/2014 -liver function tests within normal  limits except albumin of 2.0   Assessment and plan.  #1-malignant pleural effusion- does have a catheter placed there are orders to drain this on regular basis at this point continue to monitor.  #2 history of stage IV GYN adenocarcinoma-again this is being  followed by the [place] apparently  she will receive chemotherapy.  #3 history of diastolic CHF-this appears stable she has minimal edema continue to monitor this closely.  #4-some history of hyperkalemia this appears to have normalized with discontinuing potassium and Kayexalate-will update a BMP.  Anemia this is thought secondary to chronic disease and malignancy as well as blood loss-this will need follow up as well.  #6-pain management- she says the Norco when necessary is not totally effective although it does help-we'll order the Norco twice a day routine so she gets this regular-- as well as continue the every 6 hours when necessary certainly consider alternatives if this does not help   [id number]

## 2014-08-08 ENCOUNTER — Encounter: Payer: Self-pay | Admitting: Internal Medicine

## 2014-08-08 ENCOUNTER — Non-Acute Institutional Stay (SKILLED_NURSING_FACILITY): Payer: Medicare HMO | Admitting: Internal Medicine

## 2014-08-08 DIAGNOSIS — J90 Pleural effusion, not elsewhere classified: Secondary | ICD-10-CM

## 2014-08-08 DIAGNOSIS — I5030 Unspecified diastolic (congestive) heart failure: Secondary | ICD-10-CM

## 2014-08-08 DIAGNOSIS — C579 Malignant neoplasm of female genital organ, unspecified: Secondary | ICD-10-CM

## 2014-08-08 DIAGNOSIS — I509 Heart failure, unspecified: Secondary | ICD-10-CM

## 2014-08-08 NOTE — Progress Notes (Signed)
Patient ID: [name], female   DOB: 1935-04-07, 79 y.o.   MRN: [id number]  Facility; [facility] Chief complaint; admission to SNF post admit to [facility] from 8/26 to 9/4  History;  this is a 79 year old woman who presented to the hospital with a several month history of weight loss, shortness of breath, cough when she attempts to eat causing "vomiting". She also noted worsening dyspnea and left leg. She tells me she had lost 36 pounds. In hospital she was found to have a large left pleural effusion and underwent thoracentesis x2. 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 A Pleurx catheter was placed on 9/3. She completed 7 days of antibiotics for presumed pneumonia. She was seen by Dr. Janie Morning of GYN oncology. Per her note that she stated stage IV disease. Physical exam noted a mass in the cul-de-sac. She was also noted to have a mesenteric mass on her CT scan of the abdomen and pelvis, presumably these are the same things. Thoracic surgery did not feel she was a candidate for pleurocentesis.  Apparently the options for treatment as presented by GYN oncology included chemotherapy followed by debulking surgery, chemotherapy only, hospice care. Apparently the patient and her family opted for the first option. A 14-20% cure rate was present.  abdominal and pelvic pain and pleural effusion.   EXAM: CT CHEST, ABDOMEN, AND PELVIS WITH CONTRAST   TECHNIQUE: Multidetector CT imaging of the chest, abdomen and pelvis was performed following the standard protocol during bolus administration of intravenous contrast.   CONTRAST:  157mL OMNIPAQUE IOHEXOL 300 MG/ML  SOLN   COMPARISON:  None.   FINDINGS: CT CHEST FINDINGS   The heart and great vessels are within normal limits.   A large left pleural effusion and very small right pleural effusion are noted with bilateral lower lung atelectasis, left greater than right.   A tiny pleural  pericardial effusion is noted.   There is no evidence of pleural mass or definite thickening.   There is no evidence of airspace disease, definite mass, nodule, or endobronchial/ endotracheal lesion.   No enlarged lymph nodes identified.   Mild subcutaneous edema is present.   No acute or suspicious bony abnormalities are noted.   CT ABDOMEN AND PELVIS FINDINGS   Moderate ascites is present.   Medial left/ anterior  right liver scarring is noted. No focal hepatic abnormalities are otherwise identified.   Spleen, pancreas, adrenal glands and kidneys are unremarkable except for bilateral renal cortical atrophy. Patient is status post cholecystectomy.   There is no evidence of biliary dilatation, enlarged lymph nodes or abdominal aortic aneurysm.   Extensive descending and sigmoid colonic diverticulosis noted without definite diverticulitis.   There is no evidence of bowel obstruction, peritoneal thickening or nodularity or pneumoperitoneum.   Ill-defined increased density measuring 3 x 8 cm within the mesenteric is nonspecific.   No acute or suspicious bony abnormalities are identified.   IMPRESSION: Large left pleural effusion, moderate ascites, diffuse subcutaneous edema and small right pleural effusion. Associated bilateral lower lung atelectasis.   3 x 8 cm ill-defined density within the mesentery - solid mass/neoplasm is not excluded. Consider PET-CT for further evaluation as clinically indicated.   Bilateral renal cortical atrophy and colonic diverticulosis.     Electronically Signed   By: Hassan Rowan M.D.   On: 07/29/2014 12:07    Past Medical History  Diagnosis Date  . GERD (gastroesophageal reflux disease)   . Osteoarthritis   . Peripheral  edema   . CHF (congestive heart failure), NYHA class IV     diastolic   Past Surgical History  Procedure Laterality Date  . Cholecystectomy    . Cardiac catheterization    . Total abdominal hysterectomy  40  years ago    patient does not know if ovaries were removed  . Appendectomy    . Total knee arthroplasty Left   . Chest tube insertion Left 08/04/2014    Procedure: INSERTION PLEURAL DRAINAGE CATHETER;  Surgeon: Ivin Poot, MD;  Location: [facility] OR;  Service: Thoracic;  Laterality: Left;    Current Outpatient Prescriptions on File Prior to Visit  Medication Sig Dispense Refill  . guaiFENesin-dextromethorphan (ROBITUSSIN DM) 100-10 MG/5ML syrup Take 5 mLs by mouth every 4 (four) hours as needed for cough.      Marland Kitchen HYDROcodone-acetaminophen (NORCO/VICODIN) 5-325 MG per tablet Take 1 tablet by mouth every 6 (six) hours as needed for moderate pain or severe pain.  15 tablet  0     Social: The patient states she was independent living at home somewhere between [place] and [place]. She lived independently surrounded by family.  reports that she has never smoked. She does not have any smokeless tobacco history on file. She reports that she does not drink alcohol or use illicit drugs. DNR is in place  Review of systems Gen.; patient states she has been increasingly weak Respiratory; states her shortness of breath is better since the hospitalization Cardiac no exertional chest pain GI; states she coughs when she tries to eat and the coughing leads to vomiting. She does not describe nausea or abdominal pain Extremities; states her edema hasn't been in improved that. She has lower leg pain  Physical examination Gen. frail woman lying in bed in no distress. Vitals; O2 sat is 95% on 2 L respirations 18 and unlabored pulse 103 and regular Respiratory; right lung is clear, left lung decreased air entry at the base. She has a left pleural catheter in place which is to be drained Monday Wednesday and Friday. Cardiac heart sounds are normal no murmurs JVP is not elevated Abdomen; somewhat distended however I could not detect any shifting dullness. No liver no spleen no tenderness and no  masses GU no bladder distention or CVA tenderness Skin; there is a raised nodular lesion on her left anterior shin. The patient states that this has been present since February of this year an enlarging. This does not have the appearance of a melanoma but will probably need to come to the attention of dermatology at some point. A malignancy is possible Neurologic; she has antigravity strength in her legs reflexes are preserved at her knees.   Impression/plan #1 metastatic adenocarcinoma. Dr. Leone Brand notes makes reference of a mass in the cul-de-sac. Cytology of the thoracentesis fluid revealed adenocarcinoma. She will followup with medical oncology for chemotherapy as I understand this. #2 malignant left pleural effusion; Pleurx catheter is in place. This is to be drained Monday Wednesday Friday #3 weight loss, poor oral intake. She states that she has vomiting after profuse coughing when she tries to eat today. I suspect she will probably need a swallowing evaluation as well as a esophagram. #4 diastolic CHF; I see very little evidence of this currently. She is off diuretics #5 low sodium in the 131-133 range. She's not currently on diuretics. We'll need to follow this  This has the appearance of a very frail woman. She is already not eating here. I will  leave her something for nausea although she really doesn't describe this.

## 2014-08-09 ENCOUNTER — Telehealth: Payer: Self-pay | Admitting: *Deleted

## 2014-08-09 ENCOUNTER — Other Ambulatory Visit: Payer: Self-pay | Admitting: *Deleted

## 2014-08-09 LAB — CA 125: CA 125: 4954 U/mL — ABNORMAL HIGH (ref 0.0–30.2)

## 2014-08-09 MED ORDER — HYDROCODONE-ACETAMINOPHEN 5-325 MG PO TABS: 1.0000 | ORAL_TABLET | Freq: Four times a day (QID) | ORAL | Status: DC | PRN

## 2014-08-09 MED ORDER — HYDROCODONE-ACETAMINOPHEN 5-325 MG PO TABS: ORAL_TABLET | ORAL | Status: DC

## 2014-08-09 NOTE — Telephone Encounter (Signed)
[facility] 

## 2014-08-09 NOTE — Telephone Encounter (Signed)
Notified [name]  @ [place] of appointment scheduled at [place] [facility]  With Dr. Skeet Latch on 10/06/2014 @ 12:15.

## 2014-08-12 ENCOUNTER — Non-Acute Institutional Stay (SKILLED_NURSING_FACILITY): Payer: Medicare HMO | Admitting: Internal Medicine

## 2014-08-12 ENCOUNTER — Encounter (HOSPITAL_COMMUNITY): Payer: Self-pay | Admitting: Emergency Medicine

## 2014-08-12 ENCOUNTER — Emergency Department (HOSPITAL_COMMUNITY): Payer: Medicare HMO

## 2014-08-12 ENCOUNTER — Emergency Department (HOSPITAL_COMMUNITY)
Admission: EM | Admit: 2014-08-12 | Discharge: 2014-08-12 | Disposition: A | Discharge status: Home / Self Care | Payer: Medicare HMO | Attending: Emergency Medicine | Admitting: Emergency Medicine

## 2014-08-12 DIAGNOSIS — I5030 Unspecified diastolic (congestive) heart failure: Secondary | ICD-10-CM | POA: Diagnosis not present

## 2014-08-12 DIAGNOSIS — Z880 Allergy status to penicillin: Secondary | ICD-10-CM | POA: Diagnosis not present

## 2014-08-12 DIAGNOSIS — C579 Malignant neoplasm of female genital organ, unspecified: Secondary | ICD-10-CM

## 2014-08-12 DIAGNOSIS — Z9889 Other specified postprocedural states: Secondary | ICD-10-CM | POA: Insufficient documentation

## 2014-08-12 DIAGNOSIS — K625 Hemorrhage of anus and rectum: Secondary | ICD-10-CM

## 2014-08-12 DIAGNOSIS — Z859 Personal history of malignant neoplasm, unspecified: Secondary | ICD-10-CM | POA: Diagnosis not present

## 2014-08-12 DIAGNOSIS — R0789 Other chest pain: Secondary | ICD-10-CM

## 2014-08-12 DIAGNOSIS — K209 Esophagitis, unspecified without bleeding: Secondary | ICD-10-CM | POA: Diagnosis not present

## 2014-08-12 DIAGNOSIS — I5033 Acute on chronic diastolic (congestive) heart failure: Secondary | ICD-10-CM

## 2014-08-12 DIAGNOSIS — J910 Malignant pleural effusion: Secondary | ICD-10-CM

## 2014-08-12 DIAGNOSIS — Z8739 Personal history of other diseases of the musculoskeletal system and connective tissue: Secondary | ICD-10-CM | POA: Insufficient documentation

## 2014-08-12 DIAGNOSIS — R079 Chest pain, unspecified: Secondary | ICD-10-CM | POA: Diagnosis present

## 2014-08-12 HISTORY — DX: Malignant (primary) neoplasm, unspecified: C80.1

## 2014-08-12 LAB — CBC WITH DIFFERENTIAL/PLATELET
Basophils Absolute: 0 10*3/uL (ref 0.0–0.1)
Basophils Relative: 1 % (ref 0–1)
EOS ABS: 0.2 10*3/uL (ref 0.0–0.7)
Eosinophils Relative: 2 % (ref 0–5)
HCT: 31.4 % — ABNORMAL LOW (ref 36.0–46.0)
HEMOGLOBIN: 10.3 g/dL — AB (ref 12.0–15.0)
LYMPHS ABS: 1.2 10*3/uL (ref 0.7–4.0)
Lymphocytes Relative: 14 % (ref 12–46)
MCH: 26 pg (ref 26.0–34.0)
MCHC: 32.8 g/dL (ref 30.0–36.0)
MCV: 79.3 fL (ref 78.0–100.0)
Monocytes Absolute: 0.5 10*3/uL (ref 0.1–1.0)
Monocytes Relative: 6 % (ref 3–12)
Neutro Abs: 6.2 10*3/uL (ref 1.7–7.7)
Neutrophils Relative %: 77 % (ref 43–77)
PLATELETS: 481 10*3/uL — AB (ref 150–400)
RBC: 3.96 MIL/uL (ref 3.87–5.11)
RDW: 16.8 % — AB (ref 11.5–15.5)
WBC: 8 10*3/uL (ref 4.0–10.5)

## 2014-08-12 LAB — COMPREHENSIVE METABOLIC PANEL
ALT: 20 U/L (ref 0–35)
ANION GAP: 8 (ref 5–15)
AST: 42 U/L — ABNORMAL HIGH (ref 0–37)
Albumin: 1.7 g/dL — ABNORMAL LOW (ref 3.5–5.2)
Alkaline Phosphatase: 66 U/L (ref 39–117)
BUN: 19 mg/dL (ref 6–23)
CO2: 33 mEq/L — ABNORMAL HIGH (ref 19–32)
Calcium: 8.1 mg/dL — ABNORMAL LOW (ref 8.4–10.5)
Chloride: 93 mEq/L — ABNORMAL LOW (ref 96–112)
Creatinine, Ser: 0.68 mg/dL (ref 0.50–1.10)
GFR calc Af Amer: >90 mL/min (ref >90)
GFR calc non Af Amer: 82 mL/min — ABNORMAL LOW (ref >90)
Glucose, Bld: 105 mg/dL — ABNORMAL HIGH (ref 70–99)
Potassium: 4.1 mEq/L (ref 3.7–5.3)
SODIUM: 134 meq/L — AB (ref 137–147)
Total Bilirubin: 0.2 mg/dL — ABNORMAL LOW (ref 0.3–1.2)
Total Protein: 5.7 g/dL — ABNORMAL LOW (ref 6.0–8.3)

## 2014-08-12 LAB — TROPONIN I: Troponin I: <0.3 ng/mL (ref <0.30)

## 2014-08-12 MED ORDER — GI COCKTAIL ~~LOC~~
30.0000 mL | Freq: Once | ORAL | Status: AC
  Administered 2014-08-12: 30 mL via ORAL

## 2014-08-12 MED ORDER — GI COCKTAIL ~~LOC~~
ORAL | Status: AC
  Filled 2014-08-12: qty 30

## 2014-08-12 MED ORDER — PANTOPRAZOLE SODIUM 40 MG PO TBEC
40.0000 mg | DELAYED_RELEASE_TABLET | Freq: Once | ORAL | Status: AC
  Administered 2014-08-12: 40 mg via ORAL
  Filled 2014-08-12: qty 1

## 2014-08-12 MED ORDER — PANTOPRAZOLE SODIUM 40 MG PO TBEC: 40.0000 mg | DELAYED_RELEASE_TABLET | Freq: Every day | ORAL | Status: AC

## 2014-08-12 NOTE — Progress Notes (Signed)
Patient ID: [name], female   DOB: 11-09-1935, 79 y.o.   MRN: [id number]   LOC- skilled.  Facility [facility]. This is an acute visit   Chief complaint-acute visit secondary to question rectal bleed .  History of present illness.  Patient is a very pleasant 79 year old female with a history of diastolic CHF and GERD who is admitted to the hospital with worsening dyspnea and leg edema-she was found to have a large left pleural effusion and underwent thora centesis x2-cytology showed metastatic adenocarcinoma suspicious for a GYN source  She did have a catheter placed-also was placed on Levaquin for presumed pneumonia.  It was thought she had stage IV disease-she has been discharged skilled nursing with recommended left Pleurx drainage. Nursing staff apparently noted what appeared to be some blood in the toilet after a bowel movement-speaking with the patient she states he's had this before when she has a hard bowel movement.  She does not complaining of any abdominal discomfort at this time he did not relate any history of colon or abdominal issues other than again the aforementioned recently diagnosed adenocarcinoma thought to have a GYN source     Medications have been reviewed per [facility] in these are quite minimal essentially Robitussin every 4 hours-Norco every 6 hours when necessary-and Levaquin which she has completed .  Review of systems.  In general does not complain of fever chills does complain weak.  Skin does not complaining of rashes or itching.  Respiratory does not complaining of acute shortness of breath but does say it hurts sometimes when she takes a deep breath --does have a cough.  Cardiac is not complaining of chest pain appears to have fairly minimal edema does have a history of diastolic CHF.  GI-is not complaining of abdominal discomfort nausea or vomiting or constipation--does complain she states of some GERD-like burning sensations.  Muscle skeletal again does complain of  some pain when she takes a deep breath but does not complaining specifically of any joint pain.  Neurologic does not complaining of dizziness or headache or syncopal-type feelings.  Psych is not complaining of anxiety or depression-.   Physical exam.   Temperature 98.6-pulse 90 as respirations 18-blood pressure taken manually 106/60.  In general this is a frail elderly female in no distress lying comfortably in bed. Eyes-he does appear to have some erythema the left eye this is bright red erythema it appears to be a subconjunctival hemorrhage--visual acuity appears intact  Chest is clear to auscultation with some shallow air entryt-- there is no labored breathing--however with any attempt at deep inspiration she does cough--- catheter is in place left thorax.  Heart is regular rate and rhythm without murmur gallop or rub she has trace lower extremity edema.  Abdomen is soft does not appear to be tender there are positive bowel sounds Rectal-there is a small amount of what appears to be dried blood around the rectal opening.  Muscle skeletal has generalized weakness but able to move all extremities x4.  Neurologic-appears grossly intact I do not see any lateralizing findings  Psych she is alert and oriented x3 pleasant and appropriate   Labs.  08/05/2014.  Sodium 133 potassium 4.0 had been as high as 5.7 previously-BUN 20 creatinine 0.8 CO2 31.  WBC 6.0 hemoglobin 10.3 platelets 317. 08/03/2014   08/03/2014  -liver function tests within normal limits except albumin of 2.0   Assessment and plan #1-question rectal bleeding-this could be more trauma from a hard stool-will warrant followup  by GI-as was discussed with Dr. Dellia Nims via phone.  #2-malignant pleural effusion- does have a catheter placed there are orders to drain this on regular basis at this point continue to monitor.  #3 history of stage IV GYN adenocarcinoma-again this is being followed by the [place] apparently she will  receive chemotherapy.  #4 history of diastolic CHF-this appears stable she has minimal edema continue to monitor this closely.  #5-some history of hyperkalemia this appears to have normalized with discontinuing potassium and Kayexalate-wi.  Anemia this is thought secondary to chronic disease and malignancy as well as blood loss-this will need follow up as well.--   Addendum-later this afternoon patient did complain of chest pain-I did reevaluate her clinically physical exam was unchanged blood pressure was 110/over 50 and this remained stable oxygen saturation was 96%-  She described the chest pain as midsternal and unrelenting-she did not complain of increased shortness of breath or radiation of this pain--she was not diaphoretic.  Will send to the ER secondary to acute chest pain rule out coronary etiology-she does have a history of GERD-- which  could be contributing to this but says this pain is different-midsternal and unrelenting Per serial exams she remained stable with unrelieved chest pain  [id number]-of note greater than 40 minutes spent assessing patient-reassessing patient-and coordinating plan of care

## 2014-08-12 NOTE — ED Notes (Addendum)
Pt here from [facility] for evaluation of chest pain, after pt had 10 cc of fluid drawn from abdominal drain.

## 2014-08-12 NOTE — ED Notes (Signed)
Pt was given 324 asa by EMS prior to arrival

## 2014-08-12 NOTE — ED Notes (Signed)
Patient states midline chest pain from epigastrum to throat has been present throughout day, increasing intensity this evening.  Patient states it feels like her normal reflux pain.  She has had some burping.

## 2014-08-12 NOTE — Discharge Instructions (Signed)
Follow up with your md next week. °

## 2014-08-12 NOTE — ED Provider Notes (Signed)
CSN: [id number]     Arrival date & time 08/12/14  1753 History   First MD Initiated Contact with Patient 08/12/14 1804    This chart was scribed for Maudry Diego, MD by Rosary Lively, ED scribe. This patient was seen in room APA09/APA09 and the patient's care was started at 6:07 PM.  Chief Complaint  Patient presents with  . Chest Pain      Patient is a 79 y.o. female presenting with chest pain. The history is provided by the patient. No language interpreter was used.  Chest Pain Pain location:  L chest and R chest Pain severity:  Mild Timing:  Constant Progression:  Improving Chronicity:  New Associated symptoms: shortness of breath   Associated symptoms: no abdominal pain, no back pain, no cough, no fatigue and no headache    HPI Comments:  [name] is a 79 y.o. female with h/o cancer who presents to the Emergency Department complaining of chest pain. Pt reports that she was experiencing associated symtpoms of SOB, but Pt reports that she has a drain to remove fluid, and then she will be begin chemotherapy.  Past Medical History  Diagnosis Date  . GERD (gastroesophageal reflux disease)   . Osteoarthritis   . Peripheral edema   . CHF (congestive heart failure), NYHA class IV     diastolic  . Cancer    Past Surgical History  Procedure Laterality Date  . Cholecystectomy    . Cardiac catheterization    . Total abdominal hysterectomy  40 years ago    patient does not know if ovaries were removed  . Appendectomy    . Total knee arthroplasty Left   . Chest tube insertion Left 08/04/2014    Procedure: INSERTION PLEURAL DRAINAGE CATHETER;  Surgeon: Ivin Poot, MD;  Location: [facility];  Service: Thoracic;  Laterality: Left;   No family history on file. History  Substance Use Topics  . Smoking status: Never Smoker   . Smokeless tobacco: Not on file  . Alcohol Use: No   OB History   Grav Para Term Preterm Abortions TAB SAB Ect Mult Living                 Review of  Systems  Constitutional: Negative for appetite change and fatigue.  HENT: Negative for congestion, ear discharge and sinus pressure.   Eyes: Negative for discharge.  Respiratory: Positive for shortness of breath. Negative for cough.   Cardiovascular: Positive for chest pain.  Gastrointestinal: Negative for abdominal pain and diarrhea.  Genitourinary: Negative for frequency and hematuria.  Musculoskeletal: Negative for back pain.  Skin: Negative for rash.  Neurological: Negative for seizures and headaches.  Psychiatric/Behavioral: Negative for hallucinations.      Allergies  Penicillins  Home Medications   Prior to Admission medications   Medication Sig Start Date End Date Taking? Authorizing Provider  guaiFENesin-dextromethorphan (ROBITUSSIN DM) 100-10 MG/5ML syrup Take 5 mLs by mouth every 4 (four) hours as needed for cough. 08/05/14   Modena Jansky, MD  HYDROcodone-acetaminophen (NORCO/VICODIN) 5-325 MG per tablet Take one tablet by mouth twice daily and every 6 hours as needed for breakthrough pain. Hold for sedation 08/09/14   Blanchie Serve, MD   There were no vitals taken for this visit. Physical Exam  Constitutional: She is oriented to person, place, and time. She appears cachectic.  HENT:  Head: Normocephalic.  Eyes: EOM are normal. No scleral icterus.  Conjunctiva inflammed on left eye.  Neck: Neck  supple. No thyromegaly present.  Cardiovascular: Normal rate and regular rhythm.  Exam reveals no gallop and no friction rub.   No murmur heard. Pulmonary/Chest: No stridor. She has no wheezes. She has no rales. She exhibits tenderness.   Tender in chest wall bilaterally. Bandage to right lateral chest.  Abdominal: She exhibits distension. There is no tenderness. There is no rebound.   Distended abdomen that is non-tender.   Musculoskeletal: Normal range of motion. She exhibits no edema.  Lymphadenopathy:    She has no cervical adenopathy.  Neurological: She is oriented to  person, place, and time. She exhibits normal muscle tone. Coordination normal.  Skin: No rash noted. No erythema.  Psychiatric: She has a normal mood and affect. Her behavior is normal.    ED Course  Procedures  DIAGNOSTIC STUDIES: Oxygen Saturation is 94% on RA, adequate by my interpretation.  COORDINATION OF CARE: 6:10 PM-Discussed treatment plan with pt  Labs Review Labs Reviewed - No data to display  Imaging Review No results found.   EKG Interpretation   Date/Time:  Friday August 12 2014 18:04:00 EDT Ventricular Rate:  86 PR Interval:  159 QRS Duration: 95 QT Interval:  365 QTC Calculation: 436 R Axis:   41 Text Interpretation:  Sinus rhythm Anterolateral infarct, age  indeterminate Baseline wander in lead(s) II III aVF Confirmed by Antoinett Dorman   MD, Shaylon Aden [phone]) on 08/12/2014 6:21:46 PM      MDM   Final diagnoses:  None    Pt improved with gi cocktail.  Will start protonix   The chart was scribed for me under my direct supervision.  I personally performed the history, physical, and medical decision making and all procedures in the evaluation of this patient.Maudry Diego, MD 08/12/14 2111

## 2014-08-14 DIAGNOSIS — R079 Chest pain, unspecified: Secondary | ICD-10-CM | POA: Insufficient documentation

## 2014-08-14 DIAGNOSIS — K625 Hemorrhage of anus and rectum: Secondary | ICD-10-CM | POA: Insufficient documentation

## 2014-08-15 ENCOUNTER — Other Ambulatory Visit: Payer: Self-pay | Admitting: Internal Medicine

## 2014-08-15 ENCOUNTER — Non-Acute Institutional Stay (SKILLED_NURSING_FACILITY): Payer: Medicare HMO | Admitting: Internal Medicine

## 2014-08-15 DIAGNOSIS — E43 Unspecified severe protein-calorie malnutrition: Secondary | ICD-10-CM

## 2014-08-15 DIAGNOSIS — I5030 Unspecified diastolic (congestive) heart failure: Secondary | ICD-10-CM

## 2014-08-15 DIAGNOSIS — I509 Heart failure, unspecified: Secondary | ICD-10-CM

## 2014-08-15 DIAGNOSIS — R1310 Dysphagia, unspecified: Secondary | ICD-10-CM

## 2014-08-15 DIAGNOSIS — R1314 Dysphagia, pharyngoesophageal phase: Secondary | ICD-10-CM

## 2014-08-15 DIAGNOSIS — J90 Pleural effusion, not elsewhere classified: Secondary | ICD-10-CM

## 2014-08-15 NOTE — Progress Notes (Signed)
Patient ID: [name], female   DOB: March 06, 1935, 79 y.o.   MRN: [id number] Facility; [facility] Chief complaint; followup trip to the emergency room with chest pain felt to be GI, question dysphagia  History; this is a 79 year old woman that I admitted to the facility last week have. She had a several month history of weight loss shortness of breath cough when she attempts to eat causing "vomiting". She also had worsening dyspnea with a 36 pound weight loss the. She was found to have a large left malignant pleural effusion. 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 She had a Pleurx catheter placed on 9/3. She was seen by gynecologic oncology who felt she had a mass in the cul-de-sac. She was sent to the ER on 9/11 complaining of chest pain. She had lab work and a chest x-ray. She was started on protonic so. She feels this has helped. She is still complaining of a sensation of fullness in her lower chest followed by vomiting without a sensation of nausea. There is no abdominal pain , no dysuria.  DG Chest Portable 1 View   Status: Final result      PACS Images    Show images for DG Chest Portable 1 View     Study Result    CLINICAL DATA:  Chest pain.   EXAM: PORTABLE CHEST - 1 VIEW   COMPARISON:  08/05/2014.   FINDINGS: The cardiac silhouette, mediastinal and hilar contours are stable. The lungs demonstrate persistent effusions and bibasilar atelectasis or infiltrates. No pneumothorax.   IMPRESSION: Persistent effusions and bilateral infiltrates or atelectasis.     Electronically Signed   By: Kalman Jewels M.D.   On: 08/12/2014 19:13   Results for Sarah, Duke (MRN [id number]) as of 08/15/2014 08:52  Ref. Range 08/05/2014 04:10 08/05/2014 05:30 08/12/2014 18:04 08/12/2014 18:37 08/12/2014 18:46  Sodium Latest Range: 137-147 mEq/L 133 (L)    134 (L)  Potassium Latest Range: 3.7-5.3 mEq/L 4.0    4.1  Chloride Latest Range: 96-112 mEq/L 93  (L)    93 (L)  CO2 Latest Range: 19-32 mEq/L 31    33 (H)  BUN Latest Range: 6-23 mg/dL 20    19  Creatinine Latest Range: 0.50-1.10 mg/dL 0.80    0.68  Calcium Latest Range: 8.4-10.5 mg/dL 7.8 (L)    8.1 (L)  GFR calc non Af Amer Latest Range: >90 mL/min 69 (L)    82 (L)  GFR calc Af Amer Latest Range: >90 mL/min 80 (L)    >90  Glucose Latest Range: 70-99 mg/dL 111 (H)    105 (H)  Anion gap Latest Range: 5-15  9    8   Alkaline Phosphatase Latest Range: 39-117 U/L     66  Albumin Latest Range: 3.5-5.2 g/dL     1.7 (L)  AST Latest Range: 0-37 U/L     42 (H)  ALT Latest Range: 0-35 U/L     20  Total Protein Latest Range: 6.0-8.3 g/dL     5.7 (L)  Total Bilirubin Latest Range: 0.3-1.2 mg/dL     0.2 (L)  Troponin I Latest Range: <0.30 ng/mL     <0.30  WBC Latest Range: 4.0-10.5 K/uL 6.0    8.0  RBC Latest Range: 3.87-5.11 MIL/uL 4.12    3.96  Hemoglobin Latest Range: 12.0-15.0 g/dL 10.3 (L)    10.3 (L)  HCT Latest Range: 36.0-46.0 % 33.1 (L)    31.4 (L)  MCV Latest Range:  78.0-100.0 fL 80.3    79.3  MCH Latest Range: 26.0-34.0 pg 25.0 (L)    26.0  MCHC Latest Range: 30.0-36.0 g/dL 31.1    32.8  RDW Latest Range: 11.5-15.5 % 17.2 (H)    16.8 (H)  Platelets Latest Range: 150-400 K/uL 317    481 (H)  Neutrophils Relative % Latest Range: 43-77 %     77  Lymphocytes Relative Latest Range: 12-46 %     14  Monocytes Relative Latest Range: 3-12 %     6  Eosinophils Relative Latest Range: 0-5 %     2  Basophils Relative Latest Range: 0-1 %     1  NEUT# Latest Range: 1.7-7.7 K/uL     6.2  Lymphocytes Absolute Latest Range: 0.7-4.0 K/uL     1.2  Monocytes Absolute Latest Range: 0.1-1.0 K/uL     0.5  Eosinophils Absolute Latest Range: 0.0-0.7 K/uL     0.2  Basophils Absolute Latest Range: 0.0-0.1 K/uL     0.0  DG CHEST PORT 1 VIEW No range found  Rpt  Rpt   EKG 12-LEAD No range found   Rpt       Review of systems Respiratory; she is not complaining of shortness of breath Cardiac no clear  exertional chest pain GI no abdominal pain however I think there is complaints of dysphagia without odynophagia.  Physical examination Gen. patient appears very frail over she is alert and conversational Respiratory; very poor air entry bilaterally especially on the right with inspiratory crackles. There is no wheezing Cardiac heart sounds are normal JVP is not elevated Abdomen Somewhat distended however bowel sounds are active there is no liver spleen and no suggestion of ascites at least at the bedside  Impression/plan #1 profound protein calorie malnutrition #2 history of gastroesophageal reflux and I wonder whether she might have a stricture. This complaint was present well before her presentation to hospital. She tells me she vomits after eating but does not really describe nausea #3 class IV diastolic heart failure. She has bilateral pleural effusions but no other signs of heart failure at the bedside. I am going to start her on a small dose of Lasix clearly the left-sided pleural effusion was malignant #4 malignant left pleural effusion she is going to followup with oncology. She has a Pleurx drainage and place her the source of the malignancy was felt to be a GYN primary. I think she would be a poor candidate for surgery and/or chemotherapy at this point

## 2014-08-16 ENCOUNTER — Ambulatory Visit (HOSPITAL_COMMUNITY)
Admission: RE | Admit: 2014-08-16 | Discharge: 2014-08-16 | Disposition: A | Discharge status: Home / Self Care | Payer: Medicare HMO | Source: Ambulatory Visit | Attending: Internal Medicine | Admitting: Internal Medicine

## 2014-08-16 DIAGNOSIS — K449 Diaphragmatic hernia without obstruction or gangrene: Secondary | ICD-10-CM | POA: Insufficient documentation

## 2014-08-16 DIAGNOSIS — R1310 Dysphagia, unspecified: Secondary | ICD-10-CM | POA: Diagnosis present

## 2014-08-18 ENCOUNTER — Other Ambulatory Visit: Payer: Self-pay | Admitting: Cardiothoracic Surgery

## 2014-08-18 DIAGNOSIS — J910 Malignant pleural effusion: Secondary | ICD-10-CM

## 2014-08-19 ENCOUNTER — Encounter: Payer: Medicare HMO | Admitting: Cardiothoracic Surgery

## 2014-08-19 ENCOUNTER — Ambulatory Visit
Admission: RE | Admit: 2014-08-19 | Discharge: 2014-08-19 | Disposition: A | Discharge status: Home / Self Care | Payer: Medicare HMO | Source: Ambulatory Visit | Attending: Cardiothoracic Surgery | Admitting: Cardiothoracic Surgery

## 2014-08-19 DIAGNOSIS — J910 Malignant pleural effusion: Secondary | ICD-10-CM

## 2014-08-21 ENCOUNTER — Other Ambulatory Visit: Payer: Self-pay | Admitting: Oncology

## 2014-08-22 ENCOUNTER — Ambulatory Visit: Payer: Medicare HMO | Admitting: Oncology

## 2014-08-22 ENCOUNTER — Ambulatory Visit: Payer: Medicare HMO

## 2014-08-22 ENCOUNTER — Other Ambulatory Visit: Payer: Medicare HMO

## 2014-08-22 ENCOUNTER — Encounter (HOSPITAL_COMMUNITY): Payer: Self-pay

## 2014-08-23 ENCOUNTER — Encounter (HOSPITAL_COMMUNITY): Payer: Self-pay

## 2014-08-23 ENCOUNTER — Encounter (HOSPITAL_COMMUNITY): Payer: Medicare HMO | Attending: Hematology and Oncology

## 2014-08-23 ENCOUNTER — Encounter (HOSPITAL_BASED_OUTPATIENT_CLINIC_OR_DEPARTMENT_OTHER): Payer: Medicare HMO

## 2014-08-23 VITALS — BP 105/66 | HR 97 | Temp 98.0°F | Resp 20 | Wt 143.9 lb

## 2014-08-23 DIAGNOSIS — R110 Nausea: Secondary | ICD-10-CM

## 2014-08-23 DIAGNOSIS — C482 Malignant neoplasm of peritoneum, unspecified: Secondary | ICD-10-CM

## 2014-08-23 DIAGNOSIS — R0602 Shortness of breath: Secondary | ICD-10-CM

## 2014-08-23 DIAGNOSIS — J910 Malignant pleural effusion: Secondary | ICD-10-CM

## 2014-08-23 LAB — CBC WITH DIFFERENTIAL/PLATELET
Basophils Absolute: 0.1 10*3/uL (ref 0.0–0.1)
Basophils Relative: 1 % (ref 0–1)
Eosinophils Absolute: 0.1 10*3/uL (ref 0.0–0.7)
Eosinophils Relative: 2 % (ref 0–5)
HCT: 34.8 % — ABNORMAL LOW (ref 36.0–46.0)
HEMOGLOBIN: 11.2 g/dL — AB (ref 12.0–15.0)
Lymphocytes Relative: 21 % (ref 12–46)
Lymphs Abs: 1.5 10*3/uL (ref 0.7–4.0)
MCH: 25.9 pg — AB (ref 26.0–34.0)
MCHC: 32.2 g/dL (ref 30.0–36.0)
MCV: 80.6 fL (ref 78.0–100.0)
MONOS PCT: 7 % (ref 3–12)
Monocytes Absolute: 0.5 10*3/uL (ref 0.1–1.0)
Neutro Abs: 5 10*3/uL (ref 1.7–7.7)
Neutrophils Relative %: 69 % (ref 43–77)
Platelets: 514 10*3/uL — ABNORMAL HIGH (ref 150–400)
RBC: 4.32 MIL/uL (ref 3.87–5.11)
RDW: 16.5 % — ABNORMAL HIGH (ref 11.5–15.5)
WBC: 7.2 10*3/uL (ref 4.0–10.5)

## 2014-08-23 LAB — COMPREHENSIVE METABOLIC PANEL
ALBUMIN: 1.9 g/dL — AB (ref 3.5–5.2)
ALK PHOS: 89 U/L (ref 39–117)
ALT: 9 U/L (ref 0–35)
AST: 15 U/L (ref 0–37)
Anion gap: 10 (ref 5–15)
BUN: 26 mg/dL — AB (ref 6–23)
CO2: 31 mEq/L (ref 19–32)
CREATININE: 0.81 mg/dL (ref 0.50–1.10)
Calcium: 8.5 mg/dL (ref 8.4–10.5)
Chloride: 96 mEq/L (ref 96–112)
GFR calc Af Amer: 79 mL/min — ABNORMAL LOW (ref >90)
GFR calc non Af Amer: 68 mL/min — ABNORMAL LOW (ref >90)
Glucose, Bld: 116 mg/dL — ABNORMAL HIGH (ref 70–99)
POTASSIUM: 3.7 meq/L (ref 3.7–5.3)
Sodium: 137 mEq/L (ref 137–147)
TOTAL PROTEIN: 6.9 g/dL (ref 6.0–8.3)
Total Bilirubin: 0.3 mg/dL (ref 0.3–1.2)

## 2014-08-23 MED ORDER — INFLUENZA VAC SPLIT QUAD 0.5 ML IM SUSY: 0.5000 mL | PREFILLED_SYRINGE | Freq: Once | INTRAMUSCULAR | Status: DC

## 2014-08-23 NOTE — Progress Notes (Signed)
[facility]  OFFICE PROGRESS NOTE  Callaway Carlean Jews, MD [place] [address]  DIAGNOSIS: Peritoneal carcinoma - Plan: CBC with Differential, Comprehensive metabolic panel, CA 222, CBC with Differential, Comprehensive metabolic panel, CA 979, IR Fluoro Guide CV Line Right, CANCELED: IR Fluoro Guide CV Midline PICC Left  Malignant pleural effusion - Plan: IR Fluoro Guide CV Line Right, CANCELED: IR Fluoro Guide CV Midline PICC Left  Chief Complaint  Patient presents with  . Peritoneal carcinomatosis  . Malignant Pleural Effusion    CURRENT THERAPY: Pleural catheter in place to drain malignant left pleural effusion.  INTERVAL HISTORY: [name] 79 y.o. female returns for followup after diagnosis of malignant left pleural effusion at the end of August 2015, transferred to [facility] where a pleural catheter was inserted after thoracic surgery consultation and additional consultation was obtained with GYN oncology. She is currently in a rehabilitation facility. She does have for pleural catheter drain daily. There was a significant amount of fluid yesterday, exact amount unknown. She is short of breath and was oxygen 24 hours a day. She denies a worsening abdominal pain but does have occasional nausea without vomiting. She denies any diarrhea, constipation, melena, hematochezia, and has had minimal lower extremity swelling bilaterally. The patient expresses a desire to be treated with chemotherapy. She prefers to be treated at [place].  MEDICAL HISTORY: Past Medical History  Diagnosis Date  . GERD (gastroesophageal reflux disease)   . Osteoarthritis   . Peripheral edema   . CHF (congestive heart failure), NYHA class IV     diastolic  . Cancer     INTERIM HISTORY: has GERD; CONSTIPATION, CHRONIC; HEMATOCHEZIA; OSTEOARTHRITIS, LOWER LEG; KNEE PAIN; BURSITIS, KNEE; DYSPHAGIA UNSPECIFIED;  Malignant pleural effusion; Edema of both legs; Heart failure, diastolic, with acute decompensation; Hyperkalemia; Anemia, unspecified; Gynecologic malignancy; Peritoneal carcinoma; Rectal bleed; Chest pain; Protein-calorie malnutrition, severe; and Dysphagia, pharyngoesophageal phase on her problem list.    ALLERGIES:  is allergic to penicillins.  MEDICATIONS: has a current medication list which includes the following prescription(s): alum & mag hydroxide-simeth, guaifenesin-dextromethorphan, hydrocodone-acetaminophen, levofloxacin, pantoprazole, and promethazine, and the following Facility-Administered Medications: influenza vac split quadrivalent pf.  SURGICAL HISTORY:  Past Surgical History  Procedure Laterality Date  . Cholecystectomy    . Cardiac catheterization    . Total abdominal hysterectomy  40 years ago    patient does not know if ovaries were removed  . Appendectomy    . Total knee arthroplasty Left   . Chest tube insertion Left 08/04/2014    Procedure: INSERTION PLEURAL DRAINAGE CATHETER;  Surgeon: Ivin Poot, MD;  Location: [facility];  Service: Thoracic;  Laterality: Left;    FAMILY HISTORY: family history is not on file.  SOCIAL HISTORY:  reports that she has never smoked. She does not have any smokeless tobacco history on file. She reports that she does not drink alcohol or use illicit drugs.  REVIEW OF SYSTEMS:  Other than that discussed above is noncontributory.  PHYSICAL EXAMINATION: ECOG PERFORMANCE STATUS: 3 - Symptomatic, >50% confined to bed  Blood pressure 105/66, pulse 97, temperature 98 F (36.7 C), temperature source Oral, resp. rate 20, weight 143 lb 14.4 oz (65.273 kg), SpO2 100.00%.  GENERAL:alert, no distress and comfortable. Oxygen in place. SKIN: skin color, texture, turgor are normal, no rashes or significant lesions EYES: PERLA; Conjunctiva are pink and non-injected, sclera clear SINUSES: No redness  or tenderness over maxillary or ethmoid  sinuses OROPHARYNX:no exudate, no erythema on lips, buccal mucosa, or tongue. NECK: supple, thyroid normal size, non-tender, without nodularity. No masses CHEST: Normal AP diameter with no breast masses. Left pleural catheter in place. LYMPH:  no palpable lymphadenopathy in the cervical, axillary or inguinal LUNGS: clear to auscultation and percussion with normal breathing effort. Decreased breath sounds bilaterally at the bases. No breast masses. HEART: regular rate & rhythm and no murmurs. ABDOMEN:abdomen soft, non-tender and normal bowel sounds. Slightly distended with a positive fluid wave and shifting dullness. Liver and spleen not palpable. MUSCULOSKELETAL:no cyanosis of digits and no clubbing. Range of motion normal. Posterior lower 70 edema bilaterally. NEURO: alert & oriented x 3 with fluent speech, no focal motor/sensory deficits   LABORATORY DATA: Office Visit on 08/23/2014  Component Date Value Ref Range Status  . WBC 08/23/2014 7.2  4.0 - 10.5 K/uL Final  . RBC 08/23/2014 4.32  3.87 - 5.11 MIL/uL Final  . Hemoglobin 08/23/2014 11.2* 12.0 - 15.0 g/dL Final  . HCT 08/23/2014 34.8* 36.0 - 46.0 % Final  . MCV 08/23/2014 80.6  78.0 - 100.0 fL Final  . MCH 08/23/2014 25.9* 26.0 - 34.0 pg Final  . MCHC 08/23/2014 32.2  30.0 - 36.0 g/dL Final  . RDW 08/23/2014 16.5* 11.5 - 15.5 % Final  . Platelets 08/23/2014 514* 150 - 400 K/uL Final  . Neutrophils Relative % 08/23/2014 69  43 - 77 % Final  . Neutro Abs 08/23/2014 5.0  1.7 - 7.7 K/uL Final  . Lymphocytes Relative 08/23/2014 21  12 - 46 % Final  . Lymphs Abs 08/23/2014 1.5  0.7 - 4.0 K/uL Final  . Monocytes Relative 08/23/2014 7  3 - 12 % Final  . Monocytes Absolute 08/23/2014 0.5  0.1 - 1.0 K/uL Final  . Eosinophils Relative 08/23/2014 2  0 - 5 % Final  . Eosinophils Absolute 08/23/2014 0.1  0.0 - 0.7 K/uL Final  . Basophils Relative 08/23/2014 1  0 - 1 % Final  . Basophils Absolute 08/23/2014 0.1  0.0 - 0.1 K/uL Final   Admission on 08/12/2014, Discharged on 08/12/2014  Component Date Value Ref Range Status  . WBC 08/12/2014 8.0  4.0 - 10.5 K/uL Final  . RBC 08/12/2014 3.96  3.87 - 5.11 MIL/uL Final  . Hemoglobin 08/12/2014 10.3* 12.0 - 15.0 g/dL Final  . HCT 08/12/2014 31.4* 36.0 - 46.0 % Final  . MCV 08/12/2014 79.3  78.0 - 100.0 fL Final  . MCH 08/12/2014 26.0  26.0 - 34.0 pg Final  . MCHC 08/12/2014 32.8  30.0 - 36.0 g/dL Final  . RDW 08/12/2014 16.8* 11.5 - 15.5 % Final  . Platelets 08/12/2014 481* 150 - 400 K/uL Final  . Neutrophils Relative % 08/12/2014 77  43 - 77 % Final  . Neutro Abs 08/12/2014 6.2  1.7 - 7.7 K/uL Final  . Lymphocytes Relative 08/12/2014 14  12 - 46 % Final  . Lymphs Abs 08/12/2014 1.2  0.7 - 4.0 K/uL Final  . Monocytes Relative 08/12/2014 6  3 - 12 % Final  . Monocytes Absolute 08/12/2014 0.5  0.1 - 1.0 K/uL Final  . Eosinophils Relative 08/12/2014 2  0 - 5 % Final  . Eosinophils Absolute 08/12/2014 0.2  0.0 - 0.7 K/uL Final  . Basophils Relative 08/12/2014 1  0 - 1 % Final  . Basophils Absolute 08/12/2014 0.0  0.0 - 0.1 K/uL Final  . Sodium 08/12/2014 134* 137 - 147  mEq/L Final  . Potassium 08/12/2014 4.1  3.7 - 5.3 mEq/L Final  . Chloride 08/12/2014 93* 96 - 112 mEq/L Final  . CO2 08/12/2014 33* 19 - 32 mEq/L Final  . Glucose, Bld 08/12/2014 105* 70 - 99 mg/dL Final  . BUN 08/12/2014 19  6 - 23 mg/dL Final  . Creatinine, Ser 08/12/2014 0.68  0.50 - 1.10 mg/dL Final  . Calcium 08/12/2014 8.1* 8.4 - 10.5 mg/dL Final  . Total Protein 08/12/2014 5.7* 6.0 - 8.3 g/dL Final  . Albumin 08/12/2014 1.7* 3.5 - 5.2 g/dL Final  . AST 08/12/2014 42* 0 - 37 U/L Final  . ALT 08/12/2014 20  0 - 35 U/L Final  . Alkaline Phosphatase 08/12/2014 66  39 - 117 U/L Final  . Total Bilirubin 08/12/2014 0.2* 0.3 - 1.2 mg/dL Final  . GFR calc non Af Amer 08/12/2014 82* >90 mL/min Final  . GFR calc Af Amer 08/12/2014 >90  >90 mL/min Final   Comment: (NOTE)                          The eGFR  has been calculated using the CKD EPI equation.                          This calculation has not been validated in all clinical situations.                          eGFR's persistently <90 mL/min signify possible Chronic Kidney                          Disease.  . Anion gap 08/12/2014 8  5 - 15 Final  . Troponin I 08/12/2014 <0.30  <0.30 ng/mL Final   Comment:                                 Due to the release kinetics of cTnI,                          a negative result within the first hours                          of the onset of symptoms does not rule out                          myocardial infarction with certainty.                          If myocardial infarction is still suspected,                          repeat the test at appropriate intervals.  Admission on 07/27/2014, Discharged on 08/05/2014  No results displayed because visit has over 200 results.      PATHOLOGY: Adenocarcinoma, Foundation One analysis pending.     for Sarah, GIBB ([id number]) Patient: JHANIA, ETHERINGTON Collected: 07/28/2014 Client: [facility] Accession: [id number] Received: 07/29/2014 Lavonia Dana DOB: [birth year] Age: 79 Gender: F Reported: 08/01/2014 [address] Patient Ph: [phone] MRN#: [id number] Linna Hoff [place] [address] Client Acc#: Chart: Phone: [phone] Fax: LMP:  Visit#: [id number] CC: Eleonore Chiquito, MD Sinda Du CYTOPATHOLOGY REPORT ADDITIONAL INFORMATION: The malignant cells show the following immunoprofile: CK7: positive CK20: negative WT-1: positive ER: weakly positive PR-negative TTF-1: negative GCDFP-15: negative Controls are appropriate. The findings are diagnostic for metastatic adenocarcinoma and favor gynecologic/ovarian primary. Clinical and radiographic correlation is recommended. Aldona Bar MD Pathologist, Electronic Signature ( Signed 08/02/2014) Adequacy Reason Satisfactory For Evaluation. Diagnosis PLEURAL FLUID, LEFT(SPECIMEN 1 OF 1  COLLECTED 07/28/14): MALIGNANT CELLS CONSISTENT WITH METASTATIC ADENOCARCINOMA. PLEASE SEE COMMENT. Aldona Bar MD Pathologist, Electronic Signature (Case signed 08/01/2014) Source Pleural Fluid, Left, (specimen 1 of 1 collected 01/22/24 1 of 2 Duplicate copy FINAL for LAMAR, NAEF ([id number]) Gross Specimen: Received is/are 105cc's of red fluid. (BS:bs) Prepared: # Smears: 0 # Concentration Technique Slides (i.e. ThinPrep): 1 # Cell Block: 1 Additional Studies: Also received Hematology slides labeled [id number]. Comment Further immunostains will be performed and an addendum will follow. Warning Message Some of these immunohistochemical stains may have been developed and the performance characteristics determined by [facility]. Some may not have been cleared or approved by the U.S. Food and Drug Administration. The FDA has determined that such clearance or approval is not necessary. This test is used for clinical purposes. It should not be regarded as investigational or for research. This laboratory is certified under the [place] (CLIA-88) as qualified to perform high complexity clinical laboratory testing. Report signed out from the following location(s) Technical component and interpretation was performed at [place], [place], [place] [address]. CLIA #: [id number],  for DENEEN, SLAGER ([id number]) Patient: LANEICE, MENEELY Collected: 08/01/2014 Client: [facility] Accession: [id number] Received: 08/01/2014 Lavonia Dana DOB: 30-Oct-1935 Age: 89 Gender: F Reported: 08/02/2014 [address] Patient Ph: [phone] MRN#: [id number] Linna Hoff [place] [address] Client Acc#: Chart: Phone: [phone] Fax: LMP: Visit#: [id number] CC: Sinda Du CYTOPATHOLOGY REPORT Adequacy Reason Satisfactory For Evaluation. Diagnosis PLEURAL FLUID, LEFT (SPECIMEN 1 OF 1 COLLECTED 08/01/14): MALIGNANT  CELLS CONSISTENT WITH METASTATIC ADENOCARCINOMA. Aldona Bar MD Pathologist, Electronic Signature (Case signed 08/02/2014) Specimen Clinical Information thoracentesis Source Pleural Fluid, Left, (specimen 1 of 1 collected 08/01/14) Gross Specimen: Received is/are 150cc's of red fluid. (BS:bs) Prepared: # Smears: 0 # Concentration Technique Slides (i.e. ThinPrep): 1 # Cell Block: 1 Additional Studies: n/a Report signed out from the following location(s) Technical Component and Interpretation performed at [place], [place], [place] [address]. CLIA #: [id number],        Urinalysis No results found for this basename: colorurine,  appearanceur,  labspec,  phurine,  glucoseu,  hgbur,  bilirubinur,  ketonesur,  proteinur,  urobilinogen,  nitrite,  leukocytesur    RADIOGRAPHIC STUDIES: Dg Chest 1 View  08/01/2014   CLINICAL DATA:  Post LEFT thoracentesis  EXAM: CHEST - 1 VIEW  COMPARISON:  07/30/2014  FINDINGS: Decrease in LEFT pleural effusion post thoracentesis.  Moderate persistent LEFT pleural effusion and LEFT basilar atelectasis remain.  Heart appears enlarged.  Minimal RIGHT base atelectasis.  No pneumothorax following thoracentesis.  IMPRESSION: Decrease in LEFT pleural effusion and atelectasis post thoracentesis without evidence of postprocedural pneumothorax.  Enlargement of cardiac silhouette with minimal RIGHT basilar atelectasis.   Electronically Signed   By: Lavonia Dana M.D.   On: 08/01/2014 12:53   Dg Chest 1 View  07/28/2014   CLINICAL DATA:  LEFT pleural effusion post thoracentesis  EXAM: CHEST - 1 VIEW  COMPARISON:  07/27/2014  FINDINGS: Persistent large LEFT pleural effusion despite interval removal of 1.5 L of fluid from the LEFT chest.  No pneumothorax.  Heart remains enlarged.  Minimal RIGHT basilar atelectasis.  Bones demineralized.  IMPRESSION: Persistent large LEFT pleural effusion and basilar opacification despite removal 1.5 L  of fluid from the LEFT chest.  No pneumothorax post thoracentesis.   Electronically Signed   By: Lavonia Dana M.D.   On: 07/28/2014 12:01   Dg Chest 2 View  08/19/2014   CLINICAL DATA:  Shortness of Breath; pleural effusions  EXAM: CHEST  2 VIEW  COMPARISON:  August 12, 2014  FINDINGS: Pleural effusions, moderate, are again noted. There is bibasilar atelectatic change. There is atelectatic change in the left mid lung. There has been interval resolution of consolidation from the left mid lung. There is no new opacity compared to recent prior study.  Heart size and pulmonary vascularity are normal. No adenopathy. No bone lesions.  IMPRESSION: Persistent effusions without appreciable change from recent prior study. Interval resolution of left mid lung consolidation. Patchy atelectasis remains in this area. There is also atelectatic change in both lung bases. There is no change in cardiac silhouette.   Electronically Signed   By: Lowella Grip M.D.   On: 08/19/2014 17:00   Dg Chest 2 View  08/03/2014   CLINICAL DATA:  Short of breath.  Followup pleural effusions  EXAM: CHEST  2 VIEW  COMPARISON:  08/01/2014  FINDINGS: There are bilateral pleural effusions identified, left greater than right. Compared with previous exam these appear similar in volume. Left pleural effusion appears to layer posteriorly which may reflect semi-upright technique. The heart border is obscured by pleural fluid.  IMPRESSION: 1. Similar volume of pleural effusions left greater night.   Electronically Signed   By: Kerby Moors M.D.   On: 08/03/2014 13:47   Ct Chest W Contrast  07/29/2014   CLINICAL DATA:  79 year old female with shortness of breath, chest, abdominal and pelvic pain and pleural effusion.  EXAM: CT CHEST, ABDOMEN, AND PELVIS WITH CONTRAST  TECHNIQUE: Multidetector CT imaging of the chest, abdomen and pelvis was performed following the standard protocol during bolus administration of intravenous contrast.  CONTRAST:   194m OMNIPAQUE IOHEXOL 300 MG/ML  SOLN  COMPARISON:  None.  FINDINGS: CT CHEST FINDINGS  The heart and great vessels are within normal limits.  A large left pleural effusion and very small right pleural effusion are noted with bilateral lower lung atelectasis, left greater than right.  A tiny pleural pericardial effusion is noted.  There is no evidence of pleural mass or definite thickening.  There is no evidence of airspace disease, definite mass, nodule, or endobronchial/ endotracheal lesion.  No enlarged lymph nodes identified.  Mild subcutaneous edema is present.  No acute or suspicious bony abnormalities are noted.  CT ABDOMEN AND PELVIS FINDINGS  Moderate ascites is present.  Medial left/ anterior right liver scarring is noted. No focal hepatic abnormalities are otherwise identified.  Spleen, pancreas, adrenal glands and kidneys are unremarkable except for bilateral renal cortical atrophy. Patient is status post cholecystectomy.  There is no evidence of biliary dilatation, enlarged lymph nodes or abdominal aortic aneurysm.  Extensive descending and sigmoid colonic diverticulosis noted without definite diverticulitis.  There is no evidence of bowel obstruction, peritoneal thickening or nodularity or pneumoperitoneum.  Ill-defined increased density measuring 3 x 8 cm within the mesenteric is nonspecific.  No acute or suspicious bony abnormalities are identified.  IMPRESSION: Large left pleural effusion, moderate  ascites, diffuse subcutaneous edema and small right pleural effusion. Associated bilateral lower lung atelectasis.  3 x 8 cm ill-defined density within the mesentery - solid mass/neoplasm is not excluded. Consider PET-CT for further evaluation as clinically indicated.  Bilateral renal cortical atrophy and colonic diverticulosis.   Electronically Signed   By: Hassan Rowan M.D.   On: 07/29/2014 12:07   Ct Abdomen Pelvis W Contrast  07/29/2014   CLINICAL DATA:  79 year old female with shortness of breath,  chest, abdominal and pelvic pain and pleural effusion.  EXAM: CT CHEST, ABDOMEN, AND PELVIS WITH CONTRAST  TECHNIQUE: Multidetector CT imaging of the chest, abdomen and pelvis was performed following the standard protocol during bolus administration of intravenous contrast.  CONTRAST:  133m OMNIPAQUE IOHEXOL 300 MG/ML  SOLN  COMPARISON:  None.  FINDINGS: CT CHEST FINDINGS  The heart and great vessels are within normal limits.  A large left pleural effusion and very small right pleural effusion are noted with bilateral lower lung atelectasis, left greater than right.  A tiny pleural pericardial effusion is noted.  There is no evidence of pleural mass or definite thickening.  There is no evidence of airspace disease, definite mass, nodule, or endobronchial/ endotracheal lesion.  No enlarged lymph nodes identified.  Mild subcutaneous edema is present.  No acute or suspicious bony abnormalities are noted.  CT ABDOMEN AND PELVIS FINDINGS  Moderate ascites is present.  Medial left/ anterior right liver scarring is noted. No focal hepatic abnormalities are otherwise identified.  Spleen, pancreas, adrenal glands and kidneys are unremarkable except for bilateral renal cortical atrophy. Patient is status post cholecystectomy.  There is no evidence of biliary dilatation, enlarged lymph nodes or abdominal aortic aneurysm.  Extensive descending and sigmoid colonic diverticulosis noted without definite diverticulitis.  There is no evidence of bowel obstruction, peritoneal thickening or nodularity or pneumoperitoneum.  Ill-defined increased density measuring 3 x 8 cm within the mesenteric is nonspecific.  No acute or suspicious bony abnormalities are identified.  IMPRESSION: Large left pleural effusion, moderate ascites, diffuse subcutaneous edema and small right pleural effusion. Associated bilateral lower lung atelectasis.  3 x 8 cm ill-defined density within the mesentery - solid mass/neoplasm is not excluded. Consider PET-CT  for further evaluation as clinically indicated.  Bilateral renal cortical atrophy and colonic diverticulosis.   Electronically Signed   By: JHassan RowanM.D.   On: 07/29/2014 12:07   Dg Esophagus  08/16/2014   CLINICAL DATA:  Dysphagia.  EXAM: ESOPHOGRAM / BARIUM SWALLOW / BARIUM TABLET STUDY  TECHNIQUE: Combined double contrast and single contrast examination performed using effervescent crystals, thick barium liquid, and thin barium liquid. The patient was observed with fluoroscopy swallowing a 115mbarium sulphate tablet.  FLUOROSCOPY TIME:  2 min 48 seconds.  COMPARISON:  None.  FINDINGS: No mass or stricture is noted in the esophagus. Moderate sliding-type hiatal hernia is noted. Barium tablet passed without difficulty or delay through esophagus into stomach. No definite reflux is noted.  IMPRESSION: Moderate size sliding-type hiatal hernia is noted. No mass or stricture is noted in the esophagus.   Electronically Signed   By: JaSabino Dick.D.   On: 08/16/2014 10:50   Dg Chest Portable 1 View  08/12/2014   CLINICAL DATA:  Chest pain.  EXAM: PORTABLE CHEST - 1 VIEW  COMPARISON:  08/05/2014.  FINDINGS: The cardiac silhouette, mediastinal and hilar contours are stable. The lungs demonstrate persistent effusions and bibasilar atelectasis or infiltrates. No pneumothorax.  IMPRESSION: Persistent effusions and bilateral infiltrates  or atelectasis.   Electronically Signed   By: Kalman Jewels M.D.   On: 08/12/2014 19:13   Dg Chest Port 1 View  08/05/2014   CLINICAL DATA:  Pleural effusion and left PleurX catheter  EXAM: PORTABLE CHEST - 1 VIEW  COMPARISON:  Portable chest x-ray of August 04, 2014  FINDINGS: A small left pleural effusion persists. The PleurX catheter tip lies medially in the left hemi thorax at the eighth and ninth rib interspace is posteriorly. On the right there is a small effusion and basilar atelectasis. The cardiac silhouette is normal where visualized. The aortic arch is obscured   IMPRESSION: Stable small left pleural effusion with PleurX catheter in position as described. Increased density at the right lung base is consistent with small pleural effusion and interval development of atelectasis.   Electronically Signed   By: David  Martinique   On: 08/05/2014 07:19   Dg Chest Portable 1 View  08/04/2014   CLINICAL DATA:  Left pleural catheter insertion  EXAM: PORTABLE CHEST - 1 VIEW  COMPARISON:  08/03/2014  FINDINGS: Left basilar pleural catheter in satisfactory position with decrease in left effusion. There remains a small to moderate left effusion. No pneumothorax.  Improved aeration in the left lung base with decrease and left lower lobe atelectasis. Right lower lobe atelectasis unchanged.  IMPRESSION: Decreased left pleural effusion following placement of pleural catheter. No pneumothorax. Improved aeration left lower lobe.   Electronically Signed   By: Franchot Gallo M.D.   On: 08/04/2014 09:33   Dg Chest Port 1 View  07/30/2014   CLINICAL DATA:  Worsening chest pain. Prior left thoracentesis earlier this week.  EXAM: PORTABLE CHEST - 1 VIEW  COMPARISON:  02/25/2014  FINDINGS: Continued large left pleural effusion, likely slightly increased since prior study. Increasing airspace disease in the aerated left upper lobe. No pneumothorax. Cardiomegaly. No confluent opacity on the right.  IMPRESSION: Continued large left pleural effusion, likely slightly increased. Airspace disease now throughout the aerated left upper lobe.  Cardiomegaly.   Electronically Signed   By: Rolm Baptise M.D.   On: 07/30/2014 18:35   Dg Chest Port 1 View  07/27/2014   CLINICAL DATA:  Shortness of breath tonight.  EXAM: PORTABLE CHEST - 1 VIEW  COMPARISON:  11/05/2012.  FINDINGS: Interval large left pleural effusion. Mild adjacent left lung atelectasis. The right lung is clear. The heart size is difficult to assess due to the obscuration of the left heart borders. There is some increased mediastinal shift to the  right. Minimal right pleural effusion. Diffuse osteopenia.  IMPRESSION: 1. Interval large left pleural effusion with mild adjacent left lung atelectasis. There is associated increased mediastinal shift to the right. 2. Minimal right pleural effusion.   Electronically Signed   By: Enrique Sack M.D.   On: 07/27/2014 19:36   Dg C-arm 1-60 Min-no Report  08/04/2014   CLINICAL DATA: pleurex catheter   C-ARM 1-60 MINUTES  Fluoroscopy was utilized by the requesting physician.  No radiographic  interpretation.    US Thoracentesis Asp Pleural Space W/img Guide  08/01/2014   CLINICAL DATA:  Persistent LEFT pleural effusion  EXAM: US THORACENTESIS ASP PLEURAL SPACE W/IMG GUIDE  TECHNIQUE: Procedure, benefits, and risks of procedure were discussed with patient.  Written informed consent for procedure was obtained.  Time out protocol followed.  Pleural effusion localized by ultrasound at the posterior LEFT hemithorax.  Skin prepped and draped in usual sterile fashion.  Skin and soft tissues anesthetized with  8 mL of 1% lidocaine.  8 French thoracentesis catheter placed into the LEFT pleural space.  1500 mL of dark old appearing bloody fluid aspirated by syringe pump.  Procedure tolerated well by patient without immediate complication.  Fluid sent to laboratory for requested analysis.  COMPARISON:  07/28/2014  FINDINGS: As above  IMPRESSION: Ultrasound-guided thoracentesis of the LEFT pleural cavity with removal of 1500 mL of dark old appearing bloody fluid.   Electronically Signed   By: Lavonia Dana M.D.   On: 08/01/2014 12:49   US Thoracentesis Asp Pleural Space W/img Guide  07/28/2014   CLINICAL DATA:  Large LEFT pleural effusion  EXAM: US THORACENTESIS ASP PLEURAL SPACE W/IMG GUIDE  TECHNIQUE: Procedure, benefits, and risks of procedure were discussed with patient.  Written informed consent for procedure was obtained.  Time out protocol followed.  Pleural effusion localized by ultrasound at the posterior LEFT  hemithorax.  Skin prepped and draped in usual sterile fashion.  Skin and soft tissues anesthetized with 8 mL of 1% lidocaine.  8 French thoracentesis catheter placed into the LEFT pleural space.  1500 mL of serosanguineous fluid aspirated by syringe pump.  Procedure tolerated well by patient without immediate complication.  Fluid sample of 180 mL was sent to laboratory for requested analysis.  COMPARISON:  None ; correlation chest radiograph 07/27/2014  FINDINGS: As above  IMPRESSION: Removal of 1500 mL of serosanguineous fluid from the LEFT pleural space by ultrasound guided thoracentesis.   Electronically Signed   By: Lavonia Dana M.D.   On: 07/28/2014 12:46    ASSESSMENT:  #1. 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 #2.History of adenomatous polyps in November of 2010.  #3. Status post hysterectomy and bilateral salpingo-oophorectomy.  #4. Status post cholecystectomy    PLAN:  #1. Life port insertion by interventional radiology at [facility]. #2. Weekly carboplatin/Taxol on day 1. 8, and 15 of a 28 day cycle. Plan to start therapy on 09/13/2014 #3. Baseline CBC, chem profile, CA 125 today. #4. Chemotherapy teaching. #5. Followup on 09/13/2014 and treatment will be instituted.   All questions were answered. The patient knows to call the clinic with any problems, questions or concerns. We can certainly see the patient much sooner if necessary.   I spent 30 minutes counseling the patient face to face. The total time spent in the appointment was 40 minutes.    Doroteo Bradford, MD 08/23/2014 12:04 PM  DISCLAIMER:  This note was dictated with voice recognition software.  Similar sounding words can inadvertently be transcribed inaccurately and may not be corrected upon  review.

## 2014-08-23 NOTE — Patient Instructions (Signed)
[facility] Discharge Instructions  RECOMMENDATIONS MADE BY THE CONSULTANT AND ANY TEST RESULTS WILL BE SENT TO YOUR REFERRING PHYSICIAN.  EXAM FINDINGS BY THE PHYSICIAN TODAY AND SIGNS OR SYMPTOMS TO REPORT TO CLINIC OR PRIMARY PHYSICIAN: You saw Dr Barnet Glasgow today  We are contacting [name] to have your port placed.  You will be contacted by our nurse navigator to set up teaching and she will give you your chemotherapy schedule.  We did CBC with diff, CMP, and CA125 lab work today   Thank you for choosing [facility] to provide your oncology and hematology care.  To afford each patient quality time with our providers, please arrive at least 15 minutes before your scheduled appointment time.  With your help, our goal is to use those 15 minutes to complete the necessary work-up to ensure our physicians have the information they need to help with your evaluation and healthcare recommendations.    Effective January 1st, 2014, we ask that you re-schedule your appointment with our physicians should you arrive 10 or more minutes late for your appointment.  We strive to give you quality time with our providers, and arriving late affects you and other patients whose appointments are after yours.    Again, thank you for choosing [facility].  Our hope is that these requests will decrease the amount of time that you wait before being seen by our physicians.       _____________________________________________________________  Should you have questions after your visit to [facility], please contact our office at [phone] between the hours of 8:30 a.m. and 5:00 p.m.  Voicemails left after 4:30 p.m. will not be returned until the following business day.  For prescription refill requests, have your pharmacy contact our office with your prescription refill request.

## 2014-08-23 NOTE — Progress Notes (Signed)
LABS FOR CBCD,CMP,CA125 

## 2014-08-25 NOTE — Addendum Note (Signed)
Addended by: Kurtis Bushman A on: 08/25/2014 12:46 PM   Modules accepted: Miquel Dunn

## 2014-08-27 ENCOUNTER — Other Ambulatory Visit: Payer: Self-pay | Admitting: Radiology

## 2014-08-28 NOTE — Patient Instructions (Addendum)
[place]   CHEMOTHERAPY INSTRUCTIONS  Premeds: You will receive 4 medications in your IV prior to receiving chemo. These premeds will take approximately 1 hour to infuse. These include the following:  Zofran - this is an anti-nausea medication, Dexamethasone - (steroid) - this is to reduce the risk of or severity of an allergic reaction to the chemo Taxol, this will also help decrease the severity of nausea, Pepcid - anti-histamine - this is being given to reduce your risk of having an allergic reaction to the chemo Taxol, Benadryl - anti-histamine - this is being given to reduce your risk of having an allergic reaction to the chemo Taxol. This is our standard pre medications that we give prior to Taxol chemo.   Taxol - the first time you receive this drug we will titrate it very slowly to ensure that you do not have or are not having an allergic reaction to the chemo.  Side Effects: hair loss, lowers your white blood cells (fight infection), muscle aches, nausea/vomiting, irritation to the mouth (mouth sores, pain in your mouth) *neuropathy - numbness/tingling/burning in hands/fingers/feet/toes. We need to know as soon as this begins to happen so that we can monitor it and treat if necessary. The numbness generally begins in the fingertips of tips of toes and then begins to travel up the finger/toe/hand/foot. We never want you getting to where you can't pick up a pen, coin, zip a zipper, button a button, or have trouble walking. You must tell us immediately if you are experiencing peripheral neuropathy!  (the first time Taxol infuses it will take 2-3 hours, on subsequent infusions it will only take 1 hour)  Carboplatin - this medication can be hard on your kidneys - this is why we need you to drink 64 oz of fluid (preferably water/decaff fluids) 2 days prior to chemo and for up to 4-5 days after chemo. Drink more if you can. This will help to keep your kidneys flushed.  This can cause mild hair loss, lower your platelets (which make your blood clot), lower your white blood cells (fight infection), and cause nausea/vomiting. (this chemo only takes 30 minutes to infuse)  We will monitor labs prior to chemo to be sure that the blood counts are stable.   POTENTIAL SIDE EFFECTS OF TREATMENT: Increased Susceptibility to Infection, Vomiting, Constipation, Hair Thinning, Changes in Character of Skin and Nails (brittleness, dryness,etc.), Bone Marrow Suppression, Abdominal Cramping, Complete Hair Loss, Nausea, Diarrhea, Sun Sensitivity and Mouth Sores    SELF IMAGE NEEDS AND REFERRALS MADE: Obtain hair accessories as soon as possible (wigs, scarves, turbans,caps,etc.)  Referral to Look Good, Feel Better consultant   EDUCATIONAL MATERIALS GIVEN AND REVIEWED: Chemotherapy and You booklet Specific Instructions Sheets: Carboplatin, Taxol, Zofran, Dexamethasone, Pepcid, Benadryl, Compazine, Reglan, EMLA cream, port-a-cath   SELF CARE ACTIVITIES WHILE ON CHEMOTHERAPY: Increase your fluid intake 48 hours prior to treatment and drink at least 2 quarts per day after treatment., No alcohol intake., No aspirin or other medications unless approved by your oncologist., Eat foods that are light and easy to digest., Eat foods at cold or room temperature., No fried, fatty, or spicy foods immediately before or after treatment., Have teeth cleaned professionally before starting treatment. Keep dentures and partial plates clean., Use soft toothbrush and do not use mouthwashes that contain alcohol. Biotene is a good mouthwash. Use warm salt water gargles (1 teaspoon salt per 1 quart warm water) before and after meals and at bedtime.  Or you may rinse with 2 tablespoons of three -percent hydrogen peroxide mixed in eight ounces of water., Always use sunscreen with SPF (Sun Protection Factor) of 30 or higher., Use your nausea medication as directed to prevent nausea., Use your stool softener or  laxative as directed to prevent constipation. and Use your anti-diarrheal medication as directed to stop diarrhea.  Please wash your hands for at least 30 seconds using warm soapy water. Handwashing is the #1 way to prevent the spread of germs. Stay away from sick people or people who are getting over a cold. If you develop respiratory systems such as green/yellow mucus production or productive cough or persistent cough let us know and we will see if you need an antibiotic. It is a good idea to keep a pair of gloves on when going into grocery stores/[employer] to decrease your risk of coming into contact with germs on the carts, etc. Carry alcohol hand gel with you at all times and use it frequently if out in public. All foods need to be cooked thoroughly. No raw foods. No medium or undercooked meats, eggs. If your food is cooked medium well, it does not need to be hot pink or saturated with bloody liquid at all. Vegetables and fruits need to be washed/rinsed under the faucet with a dish detergent before being consumed. You can eat raw fruits and vegetables unless we tell you otherwise but it would be best if you cooked them or bought frozen. Do not eat off of salad bars or hot bars unless you really trust the cleanliness of the restaurant. If you need dental work, please let us know before you go for your appointment so that we can coordinate the best possible time for you in regards to your chemo regimen. You need to also let your dentist know that you are actively taking chemo. We may need to do labs prior to your dental appointment. We also want your bowels moving at least every other day. If this is not happening, we need to know so that we can get you on a bowel regimen to help you go.       MEDICATIONS: You have been given prescriptions for the following medications:  Metoclopramide 50m tablet. Starting the day after chemo, take 1 tablet four times a day (during waking hours) x 48 hours. Then may take  1 tablet four times a day if needed for nausea/vomiting.   Prochlorperazine 143mtablet. Starting the day after chemo, take 1 tablet four times a day (during waking hours) x 48 hours. Then may take 1 tablet four times a day if needed for nausea/vomiting.   EMLA cream. Apply a quarter size amount to port site 1 hour prior to chemo. Do not rub in. Cover with plastic wrap.  Over-the-Counter Meds:  Colace - this is a stool softener. Take 10016mapsule 2-6 times a day as needed. If you have to take more than 6 capsules of Colace a day call the [facility] - this is a mild laxative used to treat mild constipation. May take 2 tabs by mouth daily or up to twice a day as needed for mild constipation.  Milk of Magnesia - this is a laxative used to treat moderate to severe constipation. May take 2-4 tablespoons every 8 hours as needed. May increase to 8 tablespoons x 1 dose and if no bowel movement call the [facility] - this is for diarrhea. Take 2 tabs after 1st loose  stool and then 1 tab after each loose stool until you go a total of 12 hours without a loose stool. Call [facility] if loose stools continue.   SYMPTOMS TO REPORT AS SOON AS POSSIBLE AFTER TREATMENT:  FEVER GREATER THAN 100.5 F  CHILLS WITH OR WITHOUT FEVER  NAUSEA AND VOMITING THAT IS NOT CONTROLLED WITH YOUR NAUSEA MEDICATION  UNUSUAL SHORTNESS OF BREATH  UNUSUAL BRUISING OR BLEEDING  TENDERNESS IN MOUTH AND THROAT WITH OR WITHOUT PRESENCE OF ULCERS  URINARY PROBLEMS  BOWEL PROBLEMS  UNUSUAL RASH    Wear comfortable clothing and clothing appropriate for easy access to any Portacath or PICC line. Let us know if there is anything that we can do to make your therapy better!      I have been informed and understand all of the instructions given to me and have received a copy. I have been instructed to call the clinic [phone] or my family physician as soon as possible for continued medical  care, if indicated. I do not have any more questions at this time but understand that I may call the [facility] or the Patient Navigator at [phone] during office hours should I have questions or need assistance in obtaining follow-up care.          Paclitaxel injection What is this medicine? PACLITAXEL (PAK li TAX el) is a chemotherapy drug. It targets fast dividing cells, like cancer cells, and causes these cells to die. This medicine is used to treat ovarian cancer, breast cancer, and other cancers. This medicine may be used for other purposes; ask your health care provider or pharmacist if you have questions. COMMON BRAND NAME(S): Onxol, Taxol What should I tell my health care provider before I take this medicine? They need to know if you have any of these conditions: -blood disorders -irregular heartbeat -infection (especially a virus infection such as chickenpox, cold sores, or herpes) -liver disease -previous or ongoing radiation therapy -an unusual or allergic reaction to paclitaxel, alcohol, polyoxyethylated castor oil, other chemotherapy agents, other medicines, foods, dyes, or preservatives -pregnant or trying to get pregnant -breast-feeding How should I use this medicine? This drug is given as an infusion into a vein. It is administered in a hospital or clinic by a specially trained health care professional. Talk to your pediatrician regarding the use of this medicine in children. Special care may be needed. Overdosage: If you think you have taken too much of this medicine contact a poison control center or emergency room at once. NOTE: This medicine is only for you. Do not share this medicine with others. What if I miss a dose? It is important not to miss your dose. Call your doctor or health care professional if you are unable to keep an appointment. What may interact with this medicine? Do not take this medicine with any of the following  medications: -disulfiram -metronidazole This medicine may also interact with the following medications: -cyclosporine -diazepam -ketoconazole -medicines to increase blood counts like filgrastim, pegfilgrastim, sargramostim -other chemotherapy drugs like cisplatin, doxorubicin, epirubicin, etoposide, teniposide, vincristine -quinidine -testosterone -vaccines -verapamil Talk to your doctor or health care professional before taking any of these medicines: -acetaminophen -aspirin -ibuprofen -ketoprofen -naproxen This list may not describe all possible interactions. Give your health care provider a list of all the medicines, herbs, non-prescription drugs, or dietary supplements you use. Also tell them if you smoke, drink alcohol, or use illegal drugs. Some items may interact with your medicine. What should  I watch for while using this medicine? Your condition will be monitored carefully while you are receiving this medicine. You will need important blood work done while you are taking this medicine. This drug may make you feel generally unwell. This is not uncommon, as chemotherapy can affect healthy cells as well as cancer cells. Report any side effects. Continue your course of treatment even though you feel ill unless your doctor tells you to stop. In some cases, you may be given additional medicines to help with side effects. Follow all directions for their use. Call your doctor or health care professional for advice if you get a fever, chills or sore throat, or other symptoms of a cold or flu. Do not treat yourself. This drug decreases your body's ability to fight infections. Try to avoid being around people who are sick. This medicine may increase your risk to bruise or bleed. Call your doctor or health care professional if you notice any unusual bleeding. Be careful brushing and flossing your teeth or using a toothpick because you may get an infection or bleed more easily. If you have any  dental work done, tell your dentist you are receiving this medicine. Avoid taking products that contain aspirin, acetaminophen, ibuprofen, naproxen, or ketoprofen unless instructed by your doctor. These medicines may hide a fever. Do not become pregnant while taking this medicine. Women should inform their doctor if they wish to become pregnant or think they might be pregnant. There is a potential for serious side effects to an unborn child. Talk to your health care professional or pharmacist for more information. Do not breast-feed an infant while taking this medicine. Men are advised not to father a child while receiving this medicine. What side effects may I notice from receiving this medicine? Side effects that you should report to your doctor or health care professional as soon as possible: -allergic reactions like skin rash, itching or hives, swelling of the face, lips, or tongue -low blood counts - This drug may decrease the number of white blood cells, red blood cells and platelets. You may be at increased risk for infections and bleeding. -signs of infection - fever or chills, cough, sore throat, pain or difficulty passing urine -signs of decreased platelets or bleeding - bruising, pinpoint red spots on the skin, black, tarry stools, nosebleeds -signs of decreased red blood cells - unusually weak or tired, fainting spells, lightheadedness -breathing problems -chest pain -high or low blood pressure -mouth sores -nausea and vomiting -pain, swelling, redness or irritation at the injection site -pain, tingling, numbness in the hands or feet -slow or irregular heartbeat -swelling of the ankle, feet, hands Side effects that usually do not require medical attention (report to your doctor or health care professional if they continue or are bothersome): -bone pain -complete hair loss including hair on your head, underarms, pubic hair, eyebrows, and eyelashes -changes in the color of  fingernails -diarrhea -loosening of the fingernails -loss of appetite -muscle or joint pain -red flush to skin -sweating This list may not describe all possible side effects. Call your doctor for medical advice about side effects. You may report side effects to FDA at 1-800-FDA-1088. Where should I keep my medicine? This drug is given in a hospital or clinic and will not be stored at home. NOTE: This sheet is a summary. It may not cover all possible information. If you have questions about this medicine, talk to your doctor, pharmacist, or health care provider.  2015, Elsevier/Gold Standard. (2013-01-11  16:41:21) Carboplatin injection What is this medicine? CARBOPLATIN (KAR boe pla tin) is a chemotherapy drug. It targets fast dividing cells, like cancer cells, and causes these cells to die. This medicine is used to treat ovarian cancer and many other cancers. This medicine may be used for other purposes; ask your health care provider or pharmacist if you have questions. COMMON BRAND NAME(S): Paraplatin What should I tell my health care provider before I take this medicine? They need to know if you have any of these conditions: -blood disorders -hearing problems -kidney disease -recent or ongoing radiation therapy -an unusual or allergic reaction to carboplatin, cisplatin, other chemotherapy, other medicines, foods, dyes, or preservatives -pregnant or trying to get pregnant -breast-feeding How should I use this medicine? This drug is usually given as an infusion into a vein. It is administered in a hospital or clinic by a specially trained health care professional. Talk to your pediatrician regarding the use of this medicine in children. Special care may be needed. Overdosage: If you think you have taken too much of this medicine contact a poison control center or emergency room at once. NOTE: This medicine is only for you. Do not share this medicine with others. What if I miss a  dose? It is important not to miss a dose. Call your doctor or health care professional if you are unable to keep an appointment. What may interact with this medicine? -medicines for seizures -medicines to increase blood counts like filgrastim, pegfilgrastim, sargramostim -some antibiotics like amikacin, gentamicin, neomycin, streptomycin, tobramycin -vaccines Talk to your doctor or health care professional before taking any of these medicines: -acetaminophen -aspirin -ibuprofen -ketoprofen -naproxen This list may not describe all possible interactions. Give your health care provider a list of all the medicines, herbs, non-prescription drugs, or dietary supplements you use. Also tell them if you smoke, drink alcohol, or use illegal drugs. Some items may interact with your medicine. What should I watch for while using this medicine? Your condition will be monitored carefully while you are receiving this medicine. You will need important blood work done while you are taking this medicine. This drug may make you feel generally unwell. This is not uncommon, as chemotherapy can affect healthy cells as well as cancer cells. Report any side effects. Continue your course of treatment even though you feel ill unless your doctor tells you to stop. In some cases, you may be given additional medicines to help with side effects. Follow all directions for their use. Call your doctor or health care professional for advice if you get a fever, chills or sore throat, or other symptoms of a cold or flu. Do not treat yourself. This drug decreases your body's ability to fight infections. Try to avoid being around people who are sick. This medicine may increase your risk to bruise or bleed. Call your doctor or health care professional if you notice any unusual bleeding. Be careful brushing and flossing your teeth or using a toothpick because you may get an infection or bleed more easily. If you have any dental work  done, tell your dentist you are receiving this medicine. Avoid taking products that contain aspirin, acetaminophen, ibuprofen, naproxen, or ketoprofen unless instructed by your doctor. These medicines may hide a fever. Do not become pregnant while taking this medicine. Women should inform their doctor if they wish to become pregnant or think they might be pregnant. There is a potential for serious side effects to an unborn child. Talk to your health care  professional or pharmacist for more information. Do not breast-feed an infant while taking this medicine. What side effects may I notice from receiving this medicine? Side effects that you should report to your doctor or health care professional as soon as possible: -allergic reactions like skin rash, itching or hives, swelling of the face, lips, or tongue -signs of infection - fever or chills, cough, sore throat, pain or difficulty passing urine -signs of decreased platelets or bleeding - bruising, pinpoint red spots on the skin, black, tarry stools, nosebleeds -signs of decreased red blood cells - unusually weak or tired, fainting spells, lightheadedness -breathing problems -changes in hearing -changes in vision -chest pain -high blood pressure -low blood counts - This drug may decrease the number of white blood cells, red blood cells and platelets. You may be at increased risk for infections and bleeding. -nausea and vomiting -pain, swelling, redness or irritation at the injection site -pain, tingling, numbness in the hands or feet -problems with balance, talking, walking -trouble passing urine or change in the amount of urine Side effects that usually do not require medical attention (report to your doctor or health care professional if they continue or are bothersome): -hair loss -loss of appetite -metallic taste in the mouth or changes in taste This list may not describe all possible side effects. Call your doctor for medical advice  about side effects. You may report side effects to FDA at 1-800-FDA-1088. Where should I keep my medicine? This drug is given in a hospital or clinic and will not be stored at home. NOTE: This sheet is a summary. It may not cover all possible information. If you have questions about this medicine, talk to your doctor, pharmacist, or health care provider.  2015, Elsevier/Gold Standard. (2008-02-23 14:38:05) Ondansetron injection What is this medicine? ONDANSETRON (on DAN se tron) is used to treat nausea and vomiting caused by chemotherapy. It is also used to prevent or treat nausea and vomiting after surgery. This medicine may be used for other purposes; ask your health care provider or pharmacist if you have questions. COMMON BRAND NAME(S): Zofran What should I tell my health care provider before I take this medicine? They need to know if you have any of these conditions: -heart disease -history of irregular heartbeat -liver disease -low levels of magnesium or potassium in the blood -an unusual or allergic reaction to ondansetron, granisetron, other medicines, foods, dyes, or preservatives -pregnant or trying to get pregnant -breast-feeding How should I use this medicine? This medicine is for infusion into a vein. It is given by a health care professional in a hospital or clinic setting. Talk to your pediatrician regarding the use of this medicine in children. Special care may be needed. Overdosage: If you think you have taken too much of this medicine contact a poison control center or emergency room at once. NOTE: This medicine is only for you. Do not share this medicine with others. What if I miss a dose? This does not apply. What may interact with this medicine? Do not take this medicine with any of the following medications: -apomorphine -certain medicines for fungal infections like fluconazole, itraconazole, ketoconazole, posaconazole,  voriconazole -cisapride -dofetilide -dronedarone -pimozide -thioridazine -ziprasidone This medicine may also interact with the following medications: -carbamazepine -certain medicines for depression, anxiety, or psychotic disturbances -fentanyl -linezolid -MAOIs like Carbex, Eldepryl, Marplan, Nardil, and Parnate -methylene blue (injected into a vein) -other medicines that prolong the QT interval (cause an abnormal heart rhythm) -phenytoin -rifampicin -tramadol This list  may not describe all possible interactions. Give your health care provider a list of all the medicines, herbs, non-prescription drugs, or dietary supplements you use. Also tell them if you smoke, drink alcohol, or use illegal drugs. Some items may interact with your medicine. What should I watch for while using this medicine? Your condition will be monitored carefully while you are receiving this medicine. What side effects may I notice from receiving this medicine? Side effects that you should report to your doctor or health care professional as soon as possible: -allergic reactions like skin rash, itching or hives, swelling of the face, lips, or tongue -breathing problems -confusion -dizziness -fast or irregular heartbeat -feeling faint or lightheaded, falls -fever and chills -loss of balance or coordination -seizures -sweating -swelling of the hands and feet -tightness in the chest -tremors -unusually weak or tired Side effects that usually do not require medical attention (report to your doctor or health care professional if they continue or are bothersome): -constipation or diarrhea -headache This list may not describe all possible side effects. Call your doctor for medical advice about side effects. You may report side effects to FDA at 1-800-FDA-1088. Where should I keep my medicine? This drug is given in a hospital or clinic and will not be stored at home. NOTE: This sheet is a summary. It may not  cover all possible information. If you have questions about this medicine, talk to your doctor, pharmacist, or health care provider.  2015, [employer]/Gold Standard. (2013-08-25 16:18:28) Dexamethasone injection What is this medicine? DEXAMETHASONE (dex a METH a sone) is a corticosteroid. It is used to treat inflammation of the skin, joints, lungs, and other organs. Common conditions treated include asthma, allergies, and arthritis. It is also used for other conditions, like blood disorders and diseases of the adrenal glands. This medicine may be used for other purposes; ask your health care provider or pharmacist if you have questions. COMMON BRAND NAME(S): Decadron, Solurex What should I tell my health care provider before I take this medicine? They need to know if you have any of these conditions: -blood clotting problems -Cushing's syndrome -diabetes -glaucoma -heart problems or disease -high blood pressure -infection like herpes, measles, tuberculosis, or chickenpox -kidney disease -liver disease -mental problems -myasthenia gravis -osteoporosis -previous heart attack -seizures -stomach, ulcer or intestine disease including colitis and diverticulitis -thyroid problem -an unusual or allergic reaction to dexamethasone, corticosteroids, other medicines, lactose, foods, dyes, or preservatives -pregnant or trying to get pregnant -breast-feeding How should I use this medicine? This medicine is for injection into a muscle, joint, lesion, soft tissue, or vein. It is given by a health care professional in a hospital or clinic setting. Talk to your pediatrician regarding the use of this medicine in children. Special care may be needed. Overdosage: If you think you have taken too much of this medicine contact a poison control center or emergency room at once. NOTE: This medicine is only for you. Do not share this medicine with others. What if I miss a dose? This may not apply. If you are  having a series of injections over a prolonged period, try not to miss an appointment. Call your doctor or health care professional to reschedule if you are unable to keep an appointment. What may interact with this medicine? Do not take this medicine with any of the following medications: -mifepristone, RU-486 -vaccines This medicine may also interact with the following medications: -amphotericin B -antibiotics like clarithromycin, erythromycin, and troleandomycin -aspirin and aspirin-like drugs -barbiturates  like phenobarbital -carbamazepine -cholestyramine -cholinesterase inhibitors like donepezil, galantamine, rivastigmine, and tacrine -cyclosporine -digoxin -diuretics -ephedrine -female hormones, like estrogens or progestins and birth control pills -indinavir -isoniazid -ketoconazole -medicines for diabetes -medicines that improve muscle tone or strength for conditions like myasthenia gravis -NSAIDs, medicines for pain and inflammation, like ibuprofen or naproxen -phenytoin -rifampin -thalidomide -warfarin This list may not describe all possible interactions. Give your health care provider a list of all the medicines, herbs, non-prescription drugs, or dietary supplements you use. Also tell them if you smoke, drink alcohol, or use illegal drugs. Some items may interact with your medicine. What should I watch for while using this medicine? Your condition will be monitored carefully while you are receiving this medicine. If you are taking this medicine for a long time, carry an identification card with your name and address, the type and dose of your medicine, and your doctor's name and address. This medicine may increase your risk of getting an infection. Stay away from people who are sick. Tell your doctor or health care professional if you are around anyone with measles or chickenpox. Talk to your health care provider before you get any vaccines that you take this medicine. If  you are going to have surgery, tell your doctor or health care professional that you have taken this medicine within the last twelve months. Ask your doctor or health care professional about your diet. You may need to lower the amount of salt you eat. The medicine can increase your blood sugar. If you are a diabetic check with your doctor if you need help adjusting the dose of your diabetic medicine. What side effects may I notice from receiving this medicine? Side effects that you should report to your doctor or health care professional as soon as possible: -allergic reactions like skin rash, itching or hives, swelling of the face, lips, or tongue -black or tarry stools -change in the amount of urine -changes in vision -confusion, excitement, restlessness, a false sense of well-being -fever, sore throat, sneezing, cough, or other signs of infection, wounds that will not heal -hallucinations -increased thirst -mental depression, mood swings, mistaken feelings of self importance or of being mistreated -pain in hips, back, ribs, arms, shoulders, or legs -pain, redness, or irritation at the injection site -redness, blistering, peeling or loosening of the skin, including inside the mouth -rounding out of face -swelling of feet or lower legs -unusual bleeding or bruising -unusual tired or weak -wounds that do not heal Side effects that usually do not require medical attention (report to your doctor or health care professional if they continue or are bothersome): -diarrhea or constipation -change in taste -headache -nausea, vomiting -skin problems, acne, thin and shiny skin -touble sleeping -unusual growth of hair on the face or body -weight gain This list may not describe all possible side effects. Call your doctor for medical advice about side effects. You may report side effects to FDA at 1-800-FDA-1088. Where should I keep my medicine? This drug is given in a hospital or clinic and  will not be stored at home. NOTE: This sheet is a summary. It may not cover all possible information. If you have questions about this medicine, talk to your doctor, pharmacist, or health care provider.  2015, [employer]. (2008-03-10 14:04:12) Famotidine injection What is this medicine? FAMOTIDINE (fa MOE ti deen) is a type of antihistamine that blocks the release of stomach acid. It is used to treat stomach or intestinal ulcers. It can relieve ulcer pain  and discomfort, and the heartburn from acid reflux. This medicine may be used for other purposes; ask your health care provider or pharmacist if you have questions. COMMON BRAND NAME(S): Pepcid What should I tell my health care provider before I take this medicine? They need to know if you have any of these conditions: -kidney or liver disease -an unusual or allergic reaction to famotidine, other medicines, foods, dyes, or preservatives -pregnant or trying to get pregnant -breast-feeding How should I use this medicine? This medicine is for infusion into a vein. It is given by a health care professional in a hospital or clinic setting. Talk to your pediatrician regarding the use of this medicine in children. Special care may be needed. Overdosage: If you think you have taken too much of this medicine contact a poison control center or emergency room at once. NOTE: This medicine is only for you. Do not share this medicine with others. What if I miss a dose? This does not apply. What may interact with this medicine? -delavirdine -itraconazole -ketoconazole This list may not describe all possible interactions. Give your health care provider a list of all the medicines, herbs, non-prescription drugs, or dietary supplements you use. Also tell them if you smoke, drink alcohol, or use illegal drugs. Some items may interact with your medicine. What should I watch for while using this medicine? Tell your doctor or health care  professional if your condition does not start to get better or gets worse. Do not take with aspirin, ibuprofen, or other antiinflammatory medicines. These can aggravate your condition. Do not smoke cigarettes or drink alcohol. These increase irritation in your stomach and can increase the time it will take for ulcers to heal. Cigarettes and alcohol can also worsen acid reflux or heartburn. If you get black, tarry stools or vomit up what looks like coffee grounds, call your doctor or health care professional at once. You may have a bleeding ulcer. What side effects may I notice from receiving this medicine? Side effects that you should report to your doctor or health care professional as soon as possible: -allergic reactions like skin rash, itching or hives, swelling of the face, lips, or tongue -agitation, nervousness -confusion -hallucinations Side effects that usually do not require medical attention (report to your doctor or health care professional if they continue or are bothersome): -constipation -diarrhea -dizziness -headache This list may not describe all possible side effects. Call your doctor for medical advice about side effects. You may report side effects to FDA at 1-800-FDA-1088. Where should I keep my medicine? This medicine is given in a hospital or clinic. You will not be given this medicine to store at home. NOTE: This sheet is a summary. It may not cover all possible information. If you have questions about this medicine, talk to your doctor, pharmacist, or health care provider.  2015, [employer] Standard. (2008-03-23 13:24:51) Diphenhydramine injection What is this medicine? DIPHENHYDRAMINE (dye fen HYE dra meen) is an antihistamine. It is used to treat the symptoms of an allergic reaction and motion sickness. It is also used to treat Parkinson's disease. This medicine may be used for other purposes; ask your health care provider or pharmacist if you have  questions. COMMON BRAND NAME(S): Benadryl What should I tell my health care provider before I take this medicine? They need to know if you have any of these conditions: -asthma or lung disease -glaucoma -high blood pressure or heart disease -liver disease -pain or difficulty passing urine -prostate trouble -ulcers  or other stomach problems -an unusual or allergic reaction to diphenhydramine, antihistamines, other medicines foods, dyes, or preservatives -pregnant or trying to get pregnant -breast-feeding How should I use this medicine? This medicine is for injection into a vein or a muscle. It is usually given by a health care professional in a hospital or clinic setting. If you get this medicine at home, you will be taught how to prepare and give this medicine. Use exactly as directed. Take your medicine at regular intervals. Do not take your medicine more often than directed. It is important that you put your used needles and syringes in a special sharps container. Do not put them in a trash can. If you do not have a sharps container, call your pharmacist or healthcare provider to get one. Talk to your pediatrician regarding the use of this medicine in children. While this drug may be prescribed for selected conditions, precautions do apply. This medicine is not approved for use in newborns and premature babies. Patients over 27 years old may have a stronger reaction and need a smaller dose. Overdosage: If you think you have taken too much of this medicine contact a poison control center or emergency room at once. NOTE: This medicine is only for you. Do not share this medicine with others. What if I miss a dose? If you miss a dose, take it as soon as you can. If it is almost time for your next dose, take only that dose. Do not take double or extra doses. What may interact with this medicine? Do not take this medicine with any of the following medications: -MAOIs like Carbex, Eldepryl,  Marplan, Nardil, and Parnate This medicine may also interact with the following medications: -alcohol -barbiturates, like phenobarbital -medicines for bladder spasm like oxybutynin, tolterodine -medicines for blood pressure -medicines for depression, anxiety, or psychotic disturbances -medicines for movement abnormalities or Parkinson's disease -medicines for sleep -other medicines for cold, cough or allergy -some medicines for the stomach like chlordiazepoxide, dicyclomine This list may not describe all possible interactions. Give your health care provider a list of all the medicines, herbs, non-prescription drugs, or dietary supplements you use. Also tell them if you smoke, drink alcohol, or use illegal drugs. Some items may interact with your medicine. What should I watch for while using this medicine? Your condition will be monitored carefully while you are receiving this medicine. Tell your doctor or healthcare professional if your symptoms do not start to get better or if they get worse. You may get drowsy or dizzy. Do not drive, use machinery, or do anything that needs mental alertness until you know how this medicine affects you. Do not stand or sit up quickly, especially if you are an older patient. This reduces the risk of dizzy or fainting spells. Alcohol may interfere with the effect of this medicine. Avoid alcoholic drinks. Your mouth may get dry. Chewing sugarless gum or sucking hard candy, and drinking plenty of water may help. Contact your doctor if the problem does not go away or is severe. What side effects may I notice from receiving this medicine? Side effects that you should report to your doctor or health care professional as soon as possible: -allergic reactions like skin rash, itching or hives, swelling of the face, lips, or tongue -breathing problems -changes in vision -chills -confused, agitated, nervous -irregular or fast heartbeat -low blood  pressure -seizures -tremor -trouble passing urine -unusual bleeding or bruising -unusually weak or tired Side effects that usually do not  require medical attention (report to your doctor or health care professional if they continue or are bothersome): -constipation, diarrhea -drowsy -headache -loss of appetite -stomach upset, vomiting -sweating -thick mucous This list may not describe all possible side effects. Call your doctor for medical advice about side effects. You may report side effects to FDA at 1-800-FDA-1088. Where should I keep my medicine? Keep out of the reach of children. If you are using this medicine at home, you will be instructed on how to store this medicine. Throw away any unused medicine after the expiration date on the label. NOTE: This sheet is a summary. It may not cover all possible information. If you have questions about this medicine, talk to your doctor, pharmacist, or health care provider.  2015, [employer] Standard. (2008-03-08 14:28:35) Prochlorperazine tablets What is this medicine? PROCHLORPERAZINE (proe klor PER a zeen) helps to control severe nausea and vomiting. This medicine is also used to treat schizophrenia. It can also help patients who experience anxiety that is not due to psychological illness. This medicine may be used for other purposes; ask your health care provider or pharmacist if you have questions. COMMON BRAND NAME(S): Compazine What should I tell my health care provider before I take this medicine? They need to know if you have any of these conditions: -blood disorders or disease -dementia -liver disease or jaundice -Parkinson's disease -uncontrollable movement disorder -an unusual or allergic reaction to prochlorperazine, other medicines, foods, dyes, or preservatives -pregnant or trying to get pregnant -breast-feeding How should I use this medicine? Take this medicine by mouth with a glass of water. Follow the directions  on the prescription label. Take your doses at regular intervals. Do not take your medicine more often than directed. Do not stop taking this medicine suddenly. This can cause nausea, vomiting, and dizziness. Ask your doctor or health care professional for advice. Talk to your pediatrician regarding the use of this medicine in children. Special care may be needed. While this drug may be prescribed for children as young as 2 years for selected conditions, precautions do apply. Overdosage: If you think you have taken too much of this medicine contact a poison control center or emergency room at once. NOTE: This medicine is only for you. Do not share this medicine with others. What if I miss a dose? If you miss a dose, take it as soon as you can. If it is almost time for your next dose, take only that dose. Do not take double or extra doses. What may interact with this medicine? Do not take this medicine with any of the following medications: -amoxapine -antidepressants like citalopram, escitalopram, fluoxetine, paroxetine, and sertraline -deferoxamine -dofetilide -maprotiline -tricyclic antidepressants like amitriptyline, clomipramine, imipramine, nortiptyline and others This medicine may also interact with the following medications: -lithium -medicines for pain -phenytoin -propranolol -warfarin This list may not describe all possible interactions. Give your health care provider a list of all the medicines, herbs, non-prescription drugs, or dietary supplements you use. Also tell them if you smoke, drink alcohol, or use illegal drugs. Some items may interact with your medicine. What should I watch for while using this medicine? Visit your doctor or health care professional for regular checks on your progress. You may get drowsy or dizzy. Do not drive, use machinery, or do anything that needs mental alertness until you know how this medicine affects you. Do not stand or sit up quickly, especially  if you are an older patient. This reduces the risk of dizzy or  fainting spells. Alcohol may interfere with the effect of this medicine. Avoid alcoholic drinks. This medicine can reduce the response of your body to heat or cold. Dress warm in cold weather and stay hydrated in hot weather. If possible, avoid extreme temperatures like saunas, hot tubs, very hot or cold showers, or activities that can cause dehydration such as vigorous exercise. This medicine can make you more sensitive to the sun. Keep out of the sun. If you cannot avoid being in the sun, wear protective clothing and use sunscreen. Do not use sun lamps or tanning beds/booths. Your mouth may get dry. Chewing sugarless gum or sucking hard candy, and drinking plenty of water may help. Contact your doctor if the problem does not go away or is severe. What side effects may I notice from receiving this medicine? Side effects that you should report to your doctor or health care professional as soon as possible: -blurred vision -breast enlargement in men or women -breast milk in women who are not breast-feeding -chest pain, fast or irregular heartbeat -confusion, restlessness -dark yellow or brown urine -difficulty breathing or swallowing -dizziness or fainting spells -drooling, shaking, movement difficulty (shuffling walk) or rigidity -fever, chills, sore throat -involuntary or uncontrollable movements of the eyes, mouth, head, arms, and legs -seizures -stomach area pain -unusually weak or tired -unusual bleeding or bruising -yellowing of skin or eyes Side effects that usually do not require medical attention (report to your doctor or health care professional if they continue or are bothersome): -difficulty passing urine -difficulty sleeping -headache -sexual dysfunction -skin rash, or itching This list may not describe all possible side effects. Call your doctor for medical advice about side effects. You may report side effects to  FDA at 1-800-FDA-1088. Where should I keep my medicine? Keep out of the reach of children. Store at room temperature between 15 and 30 degrees C (59 and 86 degrees F). Protect from light. Throw away any unused medicine after the expiration date. NOTE: This sheet is a summary. It may not cover all possible information. If you have questions about this medicine, talk to your doctor, pharmacist, or health care provider.  2015, [employer] Standard. (2012-04-07 16:59:39) Metoclopramide tablets What is this medicine? METOCLOPRAMIDE (met oh kloe PRA mide) is used to treat the symptoms of gastroesophageal reflux disease (GERD) like heartburn. It is also used to treat people with slow emptying of the stomach and intestinal tract. This medicine may be used for other purposes; ask your health care provider or pharmacist if you have questions. COMMON BRAND NAME(S): Reglan What should I tell my health care provider before I take this medicine? They need to know if you have any of these conditions: -breast cancer -depression -diabetes -heart failure -high blood pressure -kidney disease -liver disease -Parkinson's disease or a movement disorder -pheochromocytoma -seizures -stomach obstruction, bleeding, or perforation -an unusual or allergic reaction to metoclopramide, procainamide, sulfites, other medicines, foods, dyes, or preservatives -pregnant or trying to get pregnant -breast-feeding How should I use this medicine? Take this medicine by mouth with a glass of water. Follow the directions on the prescription label. Take this medicine on an empty stomach, about 30 minutes before eating. Take your doses at regular intervals. Do not take your medicine more often than directed. Do not stop taking except on the advice of your doctor or health care professional. A special MedGuide will be given to you by the pharmacist with each prescription and refill. Be sure to read this information carefully each  time. Talk to your pediatrician regarding the use of this medicine in children. Special care may be needed. Overdosage: If you think you have taken too much of this medicine contact a poison control center or emergency room at once. NOTE: This medicine is only for you. Do not share this medicine with others. What if I miss a dose? If you miss a dose, take it as soon as you can. If it is almost time for your next dose, take only that dose. Do not take double or extra doses. What may interact with this medicine? -acetaminophen -cyclosporine -digoxin -medicines for blood pressure -medicines for diabetes, including insulin -medicines for hay fever and other allergies -medicines for depression, especially an Monoamine Oxidase Inhibitor (MAOI) -medicines for Parkinson's disease, like levodopa -medicines for sleep or for pain -tetracycline This list may not describe all possible interactions. Give your health care provider a list of all the medicines, herbs, non-prescription drugs, or dietary supplements you use. Also tell them if you smoke, drink alcohol, or use illegal drugs. Some items may interact with your medicine. What should I watch for while using this medicine? It may take a few weeks for your stomach condition to start to get better. However, do not take this medicine for longer than 12 weeks. The longer you take this medicine, and the more you take it, the greater your chances are of developing serious side effects. If you are an elderly patient, a female patient, or you have diabetes, you may be at an increased risk for side effects from this medicine. Contact your doctor immediately if you start having movements you cannot control such as lip smacking, rapid movements of the tongue, involuntary or uncontrollable movements of the eyes, head, arms and legs, or muscle twitches and spasms. Patients and their families should watch out for worsening depression or thoughts of suicide. Also watch  out for any sudden or severe changes in feelings such as feeling anxious, agitated, panicky, irritable, hostile, aggressive, impulsive, severely restless, overly excited and hyperactive, or not being able to sleep. If this happens, especially at the beginning of treatment or after a change in dose, call your doctor. Do not treat yourself for high fever. Ask your doctor or health care professional for advice. You may get drowsy or dizzy. Do not drive, use machinery, or do anything that needs mental alertness until you know how this drug affects you. Do not stand or sit up quickly, especially if you are an older patient. This reduces the risk of dizzy or fainting spells. Alcohol can make you more drowsy and dizzy. Avoid alcoholic drinks. What side effects may I notice from receiving this medicine? Side effects that you should report to your doctor or health care professional as soon as possible: -allergic reactions like skin rash, itching or hives, swelling of the face, lips, or tongue -abnormal production of milk in females -breast enlargement in both males and females -change in the way you walk -difficulty moving, speaking or swallowing -drooling, lip smacking, or rapid movements of the tongue -excessive sweating -fever -involuntary or uncontrollable movements of the eyes, head, arms and legs -irregular heartbeat or palpitations -muscle twitches and spasms -unusually weak or tired Side effects that usually do not require medical attention (report to your doctor or health care professional if they continue or are bothersome): -change in sex drive or performance -depressed mood -diarrhea -difficulty sleeping -headache -menstrual changes -restless or nervous This list may not describe all possible side effects. Call your doctor  for medical advice about side effects. You may report side effects to FDA at 1-800-FDA-1088. Where should I keep my medicine? Keep out of the reach of  children. Store at room temperature between 20 and 25 degrees C (68 and 77 degrees F). Protect from light. Keep container tightly closed. Throw away any unused medicine after the expiration date. NOTE: This sheet is a summary. It may not cover all possible information. If you have questions about this medicine, talk to your doctor, pharmacist, or health care provider.  2015, Elsevier/Gold Standard. (2012-03-17 13:04:38) Lidocaine; Prilocaine cream What is this medicine? LIDOCAINE; PRILOCAINE (LYE doe kane; PRIL oh kane) is a topical anesthetic that causes loss of feeling in the skin and surrounding tissues. It is used to numb the skin before procedures or injections. This medicine may be used for other purposes; ask your health care provider or pharmacist if you have questions. COMMON BRAND NAME(S): EMLA What should I tell my health care provider before I take this medicine? They need to know if you have any of these conditions: -glucose-6-phosphate deficiencies -heart disease -kidney or liver disease -methemoglobinemia -an unusual or allergic reaction to lidocaine, prilocaine, other medicines, foods, dyes, or preservatives -pregnant or trying to get pregnant -breast-feeding How should I use this medicine? This medicine is for external use only on the skin. Do not take by mouth. Follow the directions on the prescription label. Wash hands before and after use. Do not use more or leave in contact with the skin longer than directed. Do not apply to eyes or open wounds. It can cause irritation and blurred or temporary loss of vision. If this medicine comes in contact with your eyes, immediately rinse the eye with water. Do not touch or rub the eye. Contact your health care provider right away. Talk to your pediatrician regarding the use of this medicine in children. While this medicine may be prescribed for children for selected conditions, precautions do apply. Overdosage: If you think you have  taken too much of this medicine contact a poison control center or emergency room at once. NOTE: This medicine is only for you. Do not share this medicine with others. What if I miss a dose? This medicine is usually only applied once prior to each procedure. It must be in contact with the skin for a period of time for it to work. If you applied this medicine later than directed, tell your health care professional before starting the procedure. What may interact with this medicine? -acetaminophen -chloroquine -dapsone -medicines to control heart rhythm -nitrates like nitroglycerin and nitroprusside -other ointments, creams, or sprays that may contain anesthetic medicine -phenobarbital -phenytoin -quinine -sulfonamides like sulfacetamide, sulfamethoxazole, sulfasalazine and others This list may not describe all possible interactions. Give your health care provider a list of all the medicines, herbs, non-prescription drugs, or dietary supplements you use. Also tell them if you smoke, drink alcohol, or use illegal drugs. Some items may interact with your medicine. What should I watch for while using this medicine? Be careful to avoid injury to the treated area while it is numb and you are not aware of pain. Avoid scratching, rubbing, or exposing the treated area to hot or cold temperatures until complete sensation has returned. The numb feeling will wear off a few hours after applying the cream. What side effects may I notice from receiving this medicine? Side effects that you should report to your doctor or health care professional as soon as possible: -blurred vision -chest pain -difficulty  breathing -dizziness -drowsiness -fast or irregular heartbeat -skin rash or itching -swelling of your throat, lips, or face -trembling Side effects that usually do not require medical attention (report to your doctor or health care professional if they continue or are bothersome): -changes in ability  to feel hot or cold -redness and swelling at the application site This list may not describe all possible side effects. Call your doctor for medical advice about side effects. You may report side effects to FDA at 1-800-FDA-1088. Where should I keep my medicine? Keep out of reach of children. Store at room temperature between 15 and 30 degrees C (59 and 86 degrees F). Keep container tightly closed. Throw away any unused medicine after the expiration date. NOTE: This sheet is a summary. It may not cover all possible information. If you have questions about this medicine, talk to your doctor, pharmacist, or health care provider.  2015, Elsevier/Gold Standard. (2008-05-23 17:14:35) Implanted [facility] Guide An implanted port is a type of central line that is placed under the skin. Central lines are used to provide IV access when treatment or nutrition needs to be given through a person's veins. Implanted ports are used for long-term IV access. An implanted port may be placed because:   You need IV medicine that would be irritating to the small veins in your hands or arms.   You need long-term IV medicines, such as antibiotics.   You need IV nutrition for a long period.   You need frequent blood draws for lab tests.   You need dialysis.  Implanted ports are usually placed in the chest area, but they can also be placed in the upper arm, the abdomen, or the leg. An implanted port has two main parts:   Reservoir. The reservoir is round and will appear as a small, raised area under your skin. The reservoir is the part where a needle is inserted to give medicines or draw blood.   Catheter. The catheter is a thin, flexible tube that extends from the reservoir. The catheter is placed into a large vein. Medicine that is inserted into the reservoir goes into the catheter and then into the vein.  HOW WILL I CARE FOR MY INCISION SITE? Do not get the incision site wet. Bathe or shower as directed  by your health care provider.  HOW IS MY PORT ACCESSED? Special steps must be taken to access the port:   Before the port is accessed, a numbing cream can be placed on the skin. This helps numb the skin over the port site.   Your health care provider uses a sterile technique to access the port.  Your health care provider must put on a mask and sterile gloves.  The skin over your port is cleaned carefully with an antiseptic and allowed to dry.  The port is gently pinched between sterile gloves, and a needle is inserted into the port.  Only "non-coring" port needles should be used to access the port. Once the port is accessed, a blood return should be checked. This helps ensure that the port is in the vein and is not clogged.   If your port needs to remain accessed for a constant infusion, a clear (transparent) bandage will be placed over the needle site. The bandage and needle will need to be changed every week, or as directed by your health care provider.   Keep the bandage covering the needle clean and dry. Do not get it wet. Follow your health care  provider's instructions on how to take a shower or bath while the port is accessed.   If your port does not need to stay accessed, no bandage is needed over the port.  WHAT IS FLUSHING? Flushing helps keep the port from getting clogged. Follow your health care provider's instructions on how and when to flush the port. Ports are usually flushed with saline solution or a medicine called heparin. The need for flushing will depend on how the port is used.   If the port is used for intermittent medicines or blood draws, the port will need to be flushed:   After medicines have been given.   After blood has been drawn.   As part of routine maintenance.   If a constant infusion is running, the port may not need to be flushed.  HOW LONG WILL MY PORT STAY IMPLANTED? The port can stay in for as long as your health care provider thinks it  is needed. When it is time for the port to come out, surgery will be done to remove it. The procedure is similar to the one performed when the port was put in.  WHEN SHOULD I SEEK IMMEDIATE MEDICAL CARE? When you have an implanted port, you should seek immediate medical care if:   You notice a bad smell coming from the incision site.   You have swelling, redness, or drainage at the incision site.   You have more swelling or pain at the port site or the surrounding area.   You have a fever that is not controlled with medicine. Document Released: 11/18/2005 Document Revised: 09/08/2013 Document Reviewed: 07/26/2013 [facility] Patient Information 2015 [place], [place]. This information is not intended to replace advice given to you by your health care provider. Make sure you discuss any questions you have with your health care provider.

## 2014-08-29 MED ORDER — METOCLOPRAMIDE HCL 5 MG PO TABS: ORAL_TABLET | ORAL | Status: DC

## 2014-08-29 MED ORDER — LIDOCAINE-PRILOCAINE 2.5-2.5 % EX CREA: TOPICAL_CREAM | CUTANEOUS | Status: DC

## 2014-08-29 MED ORDER — PROCHLORPERAZINE MALEATE 10 MG PO TABS: ORAL_TABLET | ORAL | Status: DC

## 2014-08-30 ENCOUNTER — Encounter (HOSPITAL_COMMUNITY): Payer: Self-pay

## 2014-08-30 ENCOUNTER — Other Ambulatory Visit (HOSPITAL_COMMUNITY): Payer: Self-pay | Admitting: Hematology and Oncology

## 2014-08-30 ENCOUNTER — Ambulatory Visit (HOSPITAL_COMMUNITY)
Admission: RE | Admit: 2014-08-30 | Discharge: 2014-08-30 | Disposition: A | Discharge status: Home / Self Care | Payer: Medicare HMO | Source: Ambulatory Visit | Attending: Hematology and Oncology | Admitting: Hematology and Oncology

## 2014-08-30 DIAGNOSIS — C482 Malignant neoplasm of peritoneum, unspecified: Secondary | ICD-10-CM

## 2014-08-30 DIAGNOSIS — J910 Malignant pleural effusion: Secondary | ICD-10-CM | POA: Diagnosis not present

## 2014-08-30 LAB — CBC WITH DIFFERENTIAL/PLATELET
Basophils Absolute: 0 10*3/uL (ref 0.0–0.1)
Basophils Relative: 1 % (ref 0–1)
Eosinophils Absolute: 0.1 10*3/uL (ref 0.0–0.7)
Eosinophils Relative: 1 % (ref 0–5)
HCT: 36.3 % (ref 36.0–46.0)
Hemoglobin: 11.7 g/dL — ABNORMAL LOW (ref 12.0–15.0)
LYMPHS ABS: 2.1 10*3/uL (ref 0.7–4.0)
LYMPHS PCT: 25 % (ref 12–46)
MCH: 25.6 pg — ABNORMAL LOW (ref 26.0–34.0)
MCHC: 32.2 g/dL (ref 30.0–36.0)
MCV: 79.4 fL (ref 78.0–100.0)
Monocytes Absolute: 0.7 10*3/uL (ref 0.1–1.0)
Monocytes Relative: 8 % (ref 3–12)
NEUTROS ABS: 5.3 10*3/uL (ref 1.7–7.7)
NEUTROS PCT: 65 % (ref 43–77)
PLATELETS: 524 10*3/uL — AB (ref 150–400)
RBC: 4.57 MIL/uL (ref 3.87–5.11)
RDW: 16.8 % — ABNORMAL HIGH (ref 11.5–15.5)
WBC: 8.2 10*3/uL (ref 4.0–10.5)

## 2014-08-30 LAB — PROTIME-INR
INR: 1 (ref 0.00–1.49)
PROTHROMBIN TIME: 13.2 s (ref 11.6–15.2)

## 2014-08-30 LAB — APTT: aPTT: 34 seconds (ref 24–37)

## 2014-08-30 MED ORDER — VANCOMYCIN HCL IN DEXTROSE 1-5 GM/200ML-% IV SOLN
1000.0000 mg | INTRAVENOUS | Status: AC
  Administered 2014-08-30: 1000 mg via INTRAVENOUS
  Filled 2014-08-30: qty 200

## 2014-08-30 MED ORDER — SODIUM CHLORIDE 0.9 % IV SOLN
INTRAVENOUS | Status: DC
Start: 2014-08-30 — End: 2014-08-31
  Administered 2014-08-30: 12:00:00 via INTRAVENOUS

## 2014-08-30 MED ORDER — FENTANYL CITRATE 0.05 MG/ML IJ SOLN
INTRAMUSCULAR | Status: AC
  Filled 2014-08-30: qty 2

## 2014-08-30 MED ORDER — LIDOCAINE HCL 1 % IJ SOLN
INTRAMUSCULAR | Status: AC
  Filled 2014-08-30: qty 20

## 2014-08-30 MED ORDER — FENTANYL CITRATE 0.05 MG/ML IJ SOLN
INTRAMUSCULAR | Status: AC | PRN
  Administered 2014-08-30: 50 ug via INTRAVENOUS

## 2014-08-30 MED ORDER — HEPARIN SOD (PORK) LOCK FLUSH 100 UNIT/ML IV SOLN
INTRAVENOUS | Status: AC
  Filled 2014-08-30: qty 5

## 2014-08-30 MED ORDER — MIDAZOLAM HCL 2 MG/2ML IJ SOLN
INTRAMUSCULAR | Status: AC | PRN
  Administered 2014-08-30 (×2): 0.5 mg via INTRAVENOUS

## 2014-08-30 MED ORDER — HEPARIN SOD (PORK) LOCK FLUSH 100 UNIT/ML IV SOLN
500.0000 [IU] | Freq: Once | INTRAVENOUS | Status: AC
  Administered 2014-08-30: 500 [IU] via INTRAVENOUS

## 2014-08-30 MED ORDER — MIDAZOLAM HCL 2 MG/2ML IJ SOLN
INTRAMUSCULAR | Status: AC
  Filled 2014-08-30: qty 2

## 2014-08-30 NOTE — Procedures (Signed)
Interventional Radiology Procedure Note  Procedure: Placement of a right IJ approach single lumen PowerPort.  Tip is positioned at the superior cavoatrial junction and catheter is ready for immediate use.  Complications: No immediate Recommendations:  - Ok to shower tomorrow - Do not submerge for 7 days - Routine line care   Signed,  Jewelz Ricklefs S. Darci Lykins, DO    

## 2014-08-30 NOTE — H&P (Signed)
Chief Complaint: "I'm here to get a port a cath"  Referring Physician(s): Formanek,Gregory  History of Present Illness: [name] is a 79 y.o. 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  Past Medical History  Diagnosis Date  . GERD (gastroesophageal reflux disease)   . Osteoarthritis   . Peripheral edema   . CHF (congestive heart failure), NYHA class IV     diastolic  . Cancer     Past Surgical History  Procedure Laterality Date  . Cholecystectomy    . Cardiac catheterization    . Total abdominal hysterectomy  40 years ago    patient does not know if ovaries were removed  . Appendectomy    . Total knee arthroplasty Left   . Chest tube insertion Left 08/04/2014    Procedure: INSERTION PLEURAL DRAINAGE CATHETER;  Surgeon: Ivin Poot, MD;  Location: [place];  Service: Thoracic;  Laterality: Left;    Allergies: Penicillins  Medications: Prior to Admission medications   Medication Sig Start Date End Date Taking? Authorizing Provider  acetaminophen (TYLENOL) 325 MG tablet Take 650 mg by mouth every 4 (four) hours as needed for mild pain or moderate pain (pain).    Historical Provider, MD  alum & mag hydroxide-simeth (MAALOX/MYLANTA) 200-200-20 MG/5ML suspension Take 30 mLs by mouth every 4 (four) hours as needed for indigestion or heartburn (indigestion).     Historical Provider, MD  furosemide (LASIX) 20 MG tablet Take 20 mg by mouth daily.    Historical Provider, MD  guaiFENesin-dextromethorphan (ROBITUSSIN DM) 100-10 MG/5ML syrup Take 5 mLs by mouth every 4 (four) hours as needed for cough (cough).    Historical Provider, MD  HYDROcodone-acetaminophen (NORCO/VICODIN) 5-325 MG per tablet Take 1 tablet by mouth 2 (two) times daily.    Historical Provider, MD  HYDROcodone-acetaminophen (NORCO/VICODIN) 5-325 MG per tablet Take 1 tablet by mouth every 6 (six) hours  as needed for moderate pain (chest pain).     Historical Provider, MD  pantoprazole (PROTONIX) 40 MG tablet Take 1 tablet (40 mg total) by mouth daily. 08/12/14   Maudry Diego, MD  promethazine (PHENERGAN) 25 MG tablet Take 25 mg by mouth every 6 (six) hours as needed for nausea or vomiting (nausea).     Historical Provider, MD    History reviewed. No pertinent family history.  History   Social History  . Marital Status: Widowed    Spouse Name: N/A    Number of Children: N/A  . Years of Education: N/A   Occupational History  . nurses aid    Social History Main Topics  . Smoking status: Never Smoker   . Smokeless tobacco: None  . Alcohol Use: No  . Drug Use: No  . Sexual Activity: None   Other Topics Concern  . None   Social History Narrative  . None         Review of Systems  Constitutional: Positive for fatigue. Negative for fever and chills.  Respiratory: Positive for cough and shortness of breath.   Cardiovascular:       Occ chest discomfort  Gastrointestinal: Positive for nausea, vomiting and abdominal distention. Negative for abdominal pain and blood in stool.  Genitourinary: Negative for dysuria and hematuria.  Musculoskeletal: Negative for back pain.  Neurological: Negative for headaches.  Hematological: Does not bruise/bleed easily.  Psychiatric/Behavioral: The patient is nervous/anxious.     Vital Signs:  BP 112/76  Pulse 107  Temp(Src) 97.9 F (36.6 C) (Oral)  Resp 20  SpO2 99%  Physical Exam  Constitutional: She is oriented to person, place, and time.  Thin WF in NAD  Cardiovascular: Regular rhythm.   Sl tachy  Pulmonary/Chest: Effort normal.  Dim BS bases; intact left lateral pleurx  Abdominal: Soft. Bowel sounds are normal. She exhibits distension. There is no tenderness.  Musculoskeletal: Normal range of motion. She exhibits edema.  Neurological: She is alert and oriented to person, place, and time.    Imaging: Dg Chest 1  View  08/01/2014   CLINICAL DATA:  Post LEFT thoracentesis  EXAM: CHEST - 1 VIEW  COMPARISON:  07/30/2014  FINDINGS: Decrease in LEFT pleural effusion post thoracentesis.  Moderate persistent LEFT pleural effusion and LEFT basilar atelectasis remain.  Heart appears enlarged.  Minimal RIGHT base atelectasis.  No pneumothorax following thoracentesis.  IMPRESSION: Decrease in LEFT pleural effusion and atelectasis post thoracentesis without evidence of postprocedural pneumothorax.  Enlargement of cardiac silhouette with minimal RIGHT basilar atelectasis.   Electronically Signed   By: Lavonia Dana M.D.   On: 08/01/2014 12:53   Dg Chest 2 View  08/19/2014   CLINICAL DATA:  Shortness of Breath; pleural effusions  EXAM: CHEST  2 VIEW  COMPARISON:  August 12, 2014  FINDINGS: Pleural effusions, moderate, are again noted. There is bibasilar atelectatic change. There is atelectatic change in the left mid lung. There has been interval resolution of consolidation from the left mid lung. There is no new opacity compared to recent prior study.  Heart size and pulmonary vascularity are normal. No adenopathy. No bone lesions.  IMPRESSION: Persistent effusions without appreciable change from recent prior study. Interval resolution of left mid lung consolidation. Patchy atelectasis remains in this area. There is also atelectatic change in both lung bases. There is no change in cardiac silhouette.   Electronically Signed   By: Lowella Grip M.D.   On: 08/19/2014 17:00   Dg Chest 2 View  08/03/2014   CLINICAL DATA:  Short of breath.  Followup pleural effusions  EXAM: CHEST  2 VIEW  COMPARISON:  08/01/2014  FINDINGS: There are bilateral pleural effusions identified, left greater than right. Compared with previous exam these appear similar in volume. Left pleural effusion appears to layer posteriorly which may reflect semi-upright technique. The heart border is obscured by pleural fluid.  IMPRESSION: 1. Similar volume of  pleural effusions left greater night.   Electronically Signed   By: Kerby Moors M.D.   On: 08/03/2014 13:47   Dg Esophagus  08/16/2014   CLINICAL DATA:  Dysphagia.  EXAM: ESOPHOGRAM / BARIUM SWALLOW / BARIUM TABLET STUDY  TECHNIQUE: Combined double contrast and single contrast examination performed using effervescent crystals, thick barium liquid, and thin barium liquid. The patient was observed with fluoroscopy swallowing a 23mm barium sulphate tablet.  FLUOROSCOPY TIME:  2 min 48 seconds.  COMPARISON:  None.  FINDINGS: No mass or stricture is noted in the esophagus. Moderate sliding-type hiatal hernia is noted. Barium tablet passed without difficulty or delay through esophagus into stomach. No definite reflux is noted.  IMPRESSION: Moderate size sliding-type hiatal hernia is noted. No mass or stricture is noted in the esophagus.   Electronically Signed   By: Sabino Dick M.D.   On: 08/16/2014 10:50   Dg Chest Portable 1 View  08/12/2014   CLINICAL DATA:  Chest pain.  EXAM: PORTABLE CHEST - 1 VIEW  COMPARISON:  08/05/2014.  FINDINGS:  The cardiac silhouette, mediastinal and hilar contours are stable. The lungs demonstrate persistent effusions and bibasilar atelectasis or infiltrates. No pneumothorax.  IMPRESSION: Persistent effusions and bilateral infiltrates or atelectasis.   Electronically Signed   By: Kalman Jewels M.D.   On: 08/12/2014 19:13   Dg Chest Port 1 View  08/05/2014   CLINICAL DATA:  Pleural effusion and left PleurX catheter  EXAM: PORTABLE CHEST - 1 VIEW  COMPARISON:  Portable chest x-ray of August 04, 2014  FINDINGS: A small left pleural effusion persists. The PleurX catheter tip lies medially in the left hemi thorax at the eighth and ninth rib interspace is posteriorly. On the right there is a small effusion and basilar atelectasis. The cardiac silhouette is normal where visualized. The aortic arch is obscured  IMPRESSION: Stable small left pleural effusion with PleurX catheter in  position as described. Increased density at the right lung base is consistent with small pleural effusion and interval development of atelectasis.   Electronically Signed   By: David  Martinique   On: 08/05/2014 07:19   Dg Chest Portable 1 View  08/04/2014   CLINICAL DATA:  Left pleural catheter insertion  EXAM: PORTABLE CHEST - 1 VIEW  COMPARISON:  08/03/2014  FINDINGS: Left basilar pleural catheter in satisfactory position with decrease in left effusion. There remains a small to moderate left effusion. No pneumothorax.  Improved aeration in the left lung base with decrease and left lower lobe atelectasis. Right lower lobe atelectasis unchanged.  IMPRESSION: Decreased left pleural effusion following placement of pleural catheter. No pneumothorax. Improved aeration left lower lobe.   Electronically Signed   By: Franchot Gallo M.D.   On: 08/04/2014 09:33   Dg C-arm 1-60 Min-no Report  08/04/2014   CLINICAL DATA: pleurex catheter   C-ARM 1-60 MINUTES  Fluoroscopy was utilized by the requesting physician.  No radiographic  interpretation.    US Thoracentesis Asp Pleural Space W/img Guide  08/01/2014   CLINICAL DATA:  Persistent LEFT pleural effusion  EXAM: US THORACENTESIS ASP PLEURAL SPACE W/IMG GUIDE  TECHNIQUE: Procedure, benefits, and risks of procedure were discussed with patient.  Written informed consent for procedure was obtained.  Time out protocol followed.  Pleural effusion localized by ultrasound at the posterior LEFT hemithorax.  Skin prepped and draped in usual sterile fashion.  Skin and soft tissues anesthetized with 8 mL of 1% lidocaine.  8 French thoracentesis catheter placed into the LEFT pleural space.  1500 mL of dark old appearing bloody fluid aspirated by syringe pump.  Procedure tolerated well by patient without immediate complication.  Fluid sent to laboratory for requested analysis.  COMPARISON:  07/28/2014  FINDINGS: As above  IMPRESSION: Ultrasound-guided thoracentesis of the LEFT pleural  cavity with removal of 1500 mL of dark old appearing bloody fluid.   Electronically Signed   By: Lavonia Dana M.D.   On: 08/01/2014 12:49    Labs: Lab Results  Component Value Date   WBC 8.2 08/30/2014   HCT 36.3 08/30/2014   MCV 79.4 08/30/2014   PLT 524* 08/30/2014   NA 137 08/23/2014   K 3.7 08/23/2014   CL 96 08/23/2014   CO2 31 08/23/2014   GLUCOSE 116* 08/23/2014   BUN 26* 08/23/2014   CREATININE 0.81 08/23/2014   CALCIUM 8.5 08/23/2014   PROT 6.9 08/23/2014   ALBUMIN 1.9* 08/23/2014   AST 15 08/23/2014   ALT 9 08/23/2014   ALKPHOS 89 08/23/2014   BILITOT 0.3 08/23/2014   GFRNONAA 68* 08/23/2014  GFRAA 79* 08/23/2014   INR 1.00 08/30/2014   CEA 1.5 08/03/2014   CA199 10.8* 07/27/2014    Assessment and Plan: [name] is a 78 y.o. 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 Details/risks of procedure d/w pt/husband with their understanding and consent.           Signed: Autumn Messing 08/30/2014, 1:17 PM

## 2014-08-30 NOTE — Discharge Instructions (Signed)

## 2014-08-31 ENCOUNTER — Ambulatory Visit: Payer: Medicare HMO | Admitting: Cardiothoracic Surgery

## 2014-08-31 ENCOUNTER — Inpatient Hospital Stay (HOSPITAL_COMMUNITY): Payer: Medicare HMO

## 2014-09-01 LAB — CA 125

## 2014-09-03 ENCOUNTER — Non-Acute Institutional Stay (SKILLED_NURSING_FACILITY): Payer: Medicare HMO | Admitting: Internal Medicine

## 2014-09-03 ENCOUNTER — Encounter: Payer: Self-pay | Admitting: Internal Medicine

## 2014-09-03 DIAGNOSIS — K625 Hemorrhage of anus and rectum: Secondary | ICD-10-CM

## 2014-09-03 DIAGNOSIS — K5909 Other constipation: Secondary | ICD-10-CM

## 2014-09-03 DIAGNOSIS — J910 Malignant pleural effusion: Secondary | ICD-10-CM

## 2014-09-03 DIAGNOSIS — R1310 Dysphagia, unspecified: Secondary | ICD-10-CM

## 2014-09-03 DIAGNOSIS — E43 Unspecified severe protein-calorie malnutrition: Secondary | ICD-10-CM

## 2014-09-03 DIAGNOSIS — I509 Heart failure, unspecified: Secondary | ICD-10-CM

## 2014-09-03 DIAGNOSIS — K5900 Constipation, unspecified: Secondary | ICD-10-CM

## 2014-09-03 DIAGNOSIS — R1314 Dysphagia, pharyngoesophageal phase: Secondary | ICD-10-CM

## 2014-09-05 ENCOUNTER — Other Ambulatory Visit (HOSPITAL_COMMUNITY): Payer: Self-pay | Admitting: Oncology

## 2014-09-05 ENCOUNTER — Other Ambulatory Visit: Payer: Self-pay | Admitting: Cardiothoracic Surgery

## 2014-09-05 ENCOUNTER — Encounter (HOSPITAL_COMMUNITY): Payer: Medicare HMO | Attending: Hematology

## 2014-09-05 DIAGNOSIS — N189 Chronic kidney disease, unspecified: Secondary | ICD-10-CM | POA: Insufficient documentation

## 2014-09-05 DIAGNOSIS — D631 Anemia in chronic kidney disease: Secondary | ICD-10-CM | POA: Insufficient documentation

## 2014-09-05 DIAGNOSIS — J910 Malignant pleural effusion: Secondary | ICD-10-CM

## 2014-09-05 DIAGNOSIS — C569 Malignant neoplasm of unspecified ovary: Secondary | ICD-10-CM | POA: Insufficient documentation

## 2014-09-05 DIAGNOSIS — C482 Malignant neoplasm of peritoneum, unspecified: Secondary | ICD-10-CM | POA: Insufficient documentation

## 2014-09-05 MED ORDER — ONDANSETRON 8 MG PO TBDP
8.0000 mg | ORAL_TABLET | ORAL | Status: AC
  Administered 2014-09-05: 8 mg via ORAL
  Filled 2014-09-05: qty 1

## 2014-09-05 MED ORDER — LORAZEPAM 1 MG PO TABS
ORAL_TABLET | ORAL | Status: AC
  Filled 2014-09-05: qty 1

## 2014-09-05 MED ORDER — ONDANSETRON HCL 4 MG PO TABS: 8.0000 mg | ORAL_TABLET | ORAL | Status: DC

## 2014-09-05 MED ORDER — LORAZEPAM 1 MG PO TABS
0.5000 mg | ORAL_TABLET | ORAL | Status: AC
  Administered 2014-09-05: 0.5 mg via ORAL

## 2014-09-05 NOTE — Progress Notes (Signed)
Chemo teaching done on Taxol & Carboplatin. Consent to be signed on Day 1 of chemo. Patient residing @ [facility]. Patient had to be given Ativan and Zofran po for vomiting while here. Patient did stop vomiting prior to leaving [place].  I will call patient later to see if the meds worked for patient and if so, [name] wants [place] to discontinue the Compazine (that has never been started) and start Zofran 8mg  prn. Patient to discontinue use of Phenergan and be started on Reglan 5mg  QID. LGFB scheduled for 10/7 @ 10. Notes written for [facility] and orders to be faxed regarding change in meds.

## 2014-09-07 ENCOUNTER — Ambulatory Visit
Admission: RE | Admit: 2014-09-07 | Discharge: 2014-09-07 | Disposition: A | Discharge status: Home / Self Care | Payer: Medicare HMO | Source: Ambulatory Visit | Attending: Cardiothoracic Surgery | Admitting: Cardiothoracic Surgery

## 2014-09-07 ENCOUNTER — Encounter: Payer: Self-pay | Admitting: Cardiothoracic Surgery

## 2014-09-07 ENCOUNTER — Other Ambulatory Visit: Payer: Self-pay | Admitting: *Deleted

## 2014-09-07 ENCOUNTER — Ambulatory Visit (INDEPENDENT_AMBULATORY_CARE_PROVIDER_SITE_OTHER): Payer: Medicare HMO | Admitting: Cardiothoracic Surgery

## 2014-09-07 VITALS — BP 89/63 | HR 94 | Ht 65.0 in | Wt 143.0 lb

## 2014-09-07 DIAGNOSIS — J910 Malignant pleural effusion: Secondary | ICD-10-CM

## 2014-09-07 NOTE — Progress Notes (Addendum)
PCP is [name], MD Referring Provider is [name], MD  Chief Complaint  Patient presents with  . F/U THORACIC    F/U LEFT MAL. PL EFF/ LEFT PLEUR    HPI: 79 year old female returns for routine followup of left Pleurx catheter placed month ago for recurrent malignant effusion. Primary is peritoneal carcinoma. Patient recently had a right Port-A-Cath placed in preparation for chemotherapy to be done at the cancer center at [facility]. She is staying at the [place] facility. The Pleurx catheter has had minimal drainage since its insertion and will reduce the drainage schedule to once a week on Tuesdays.  Chest x-ray today shows Large right pleural effusion which has progressed over the past month.. We will perform right thoracentesis today in the office and hopefully can avoid a right-sided Pleurx catheter now that chemotherapy is to be initiated.    Past Medical History  Diagnosis Date  . GERD (gastroesophageal reflux disease)   . Osteoarthritis   . Peripheral edema   . CHF (congestive heart failure), NYHA class IV     diastolic  . Cancer     Past Surgical History  Procedure Laterality Date  . Cholecystectomy    . Cardiac catheterization    . Total abdominal hysterectomy  40 years ago    patient does not know if ovaries were removed  . Appendectomy    . Total knee arthroplasty Left   . Chest tube insertion Left 08/04/2014    Procedure: INSERTION PLEURAL DRAINAGE CATHETER;  Surgeon: Ivin Poot, MD;  Location: [place];  Service: Thoracic;  Laterality: Left;    No family history on file.  Social History History  Substance Use Topics  . Smoking status: Never Smoker   . Smokeless tobacco: Not on file  . Alcohol Use: No    Current Outpatient Prescriptions  Medication Sig Dispense Refill  . acetaminophen (TYLENOL) 325 MG tablet Take 650 mg by mouth every 4 (four) hours as needed for mild pain or moderate pain (pain).      . furosemide (LASIX) 20 MG  tablet Take 20 mg by mouth daily.      Marland Kitchen HYDROcodone-acetaminophen (NORCO/VICODIN) 5-325 MG per tablet Take 1 tablet by mouth every 6 (six) hours as needed for moderate pain (chest pain).       . pantoprazole (PROTONIX) 40 MG tablet Take 1 tablet (40 mg total) by mouth daily.  30 tablet  0  . alum & mag hydroxide-simeth (MAALOX/MYLANTA) 200-200-20 MG/5ML suspension Take 30 mLs by mouth every 4 (four) hours as needed for indigestion or heartburn (indigestion).        No current facility-administered medications for this visit.    Allergies  Allergen Reactions  . Penicillins     Unknown childhood reaction    Review of Systems patient is weak with poor appetite   BP 89/63  Pulse 94  Ht 5\' 5"  (1.651 m)  Wt 143 lb (64.864 kg)  BMI 23.80 kg/m2  SpO2 94% Physical Exam  elderly female fragile   diminished breath sounds on the right Left Pleurx catheter site clean and dry sutures are removed Heart rate regular Neuro intact generally weak   Under informed consent and after a proper timeout a right sided thoracentesis was performed which drained 2.0 L of fluid. Post thoracentesis chest x-ray is pending.  Diagnostic Tests: Chest x-ray shows large right pleural effusion. This will need to be drained. We will perform a thoracentesis in the office today. If the  right side effusion recurs and we will consider for right-sided Pleurx catheter.  Impression  Left-sided Pleurx catheter with minimal drainage since insertion a month ago. The right-sided pleural effusion to be treated with thoracentesis. Consider right-sided Pleurx catheter if. right effusion recurs  Plan-reduce left Pleurx drainage to once every week on Tuesdays. Return for followup in 4 weeks. with chest x-ray.

## 2014-09-08 ENCOUNTER — Inpatient Hospital Stay (HOSPITAL_COMMUNITY)
Admission: EM | Admit: 2014-09-08 | Discharge: 2014-09-14 | DRG: 754 | Disposition: A | Discharge status: Skilled Nursing Facility | Payer: Medicare HMO | Attending: Internal Medicine | Admitting: Internal Medicine

## 2014-09-08 ENCOUNTER — Encounter (HOSPITAL_COMMUNITY): Payer: Self-pay | Admitting: Emergency Medicine

## 2014-09-08 ENCOUNTER — Emergency Department (HOSPITAL_COMMUNITY): Payer: Medicare HMO

## 2014-09-08 DIAGNOSIS — C569 Malignant neoplasm of unspecified ovary: Secondary | ICD-10-CM | POA: Diagnosis not present

## 2014-09-08 DIAGNOSIS — M1990 Unspecified osteoarthritis, unspecified site: Secondary | ICD-10-CM | POA: Diagnosis present

## 2014-09-08 DIAGNOSIS — Z66 Do not resuscitate: Secondary | ICD-10-CM | POA: Diagnosis present

## 2014-09-08 DIAGNOSIS — Z9889 Other specified postprocedural states: Secondary | ICD-10-CM

## 2014-09-08 DIAGNOSIS — I5033 Acute on chronic diastolic (congestive) heart failure: Secondary | ICD-10-CM

## 2014-09-08 DIAGNOSIS — Z6821 Body mass index (BMI) 21.0-21.9, adult: Secondary | ICD-10-CM

## 2014-09-08 DIAGNOSIS — Z96652 Presence of left artificial knee joint: Secondary | ICD-10-CM | POA: Diagnosis present

## 2014-09-08 DIAGNOSIS — J9621 Acute and chronic respiratory failure with hypoxia: Secondary | ICD-10-CM

## 2014-09-08 DIAGNOSIS — J910 Malignant pleural effusion: Secondary | ICD-10-CM

## 2014-09-08 DIAGNOSIS — D638 Anemia in other chronic diseases classified elsewhere: Secondary | ICD-10-CM

## 2014-09-08 DIAGNOSIS — J90 Pleural effusion, not elsewhere classified: Secondary | ICD-10-CM | POA: Diagnosis not present

## 2014-09-08 DIAGNOSIS — R110 Nausea: Secondary | ICD-10-CM | POA: Diagnosis present

## 2014-09-08 DIAGNOSIS — C482 Malignant neoplasm of peritoneum, unspecified: Secondary | ICD-10-CM

## 2014-09-08 DIAGNOSIS — E43 Unspecified severe protein-calorie malnutrition: Secondary | ICD-10-CM

## 2014-09-08 DIAGNOSIS — C579 Malignant neoplasm of female genital organ, unspecified: Secondary | ICD-10-CM

## 2014-09-08 DIAGNOSIS — D649 Anemia, unspecified: Secondary | ICD-10-CM | POA: Diagnosis present

## 2014-09-08 DIAGNOSIS — K219 Gastro-esophageal reflux disease without esophagitis: Secondary | ICD-10-CM | POA: Diagnosis present

## 2014-09-08 DIAGNOSIS — C786 Secondary malignant neoplasm of retroperitoneum and peritoneum: Secondary | ICD-10-CM | POA: Diagnosis present

## 2014-09-08 DIAGNOSIS — R64 Cachexia: Secondary | ICD-10-CM | POA: Diagnosis present

## 2014-09-08 DIAGNOSIS — I509 Heart failure, unspecified: Secondary | ICD-10-CM

## 2014-09-08 LAB — CBC
HCT: 36 % (ref 36.0–46.0)
Hemoglobin: 11.7 g/dL — ABNORMAL LOW (ref 12.0–15.0)
MCH: 25.9 pg — ABNORMAL LOW (ref 26.0–34.0)
MCHC: 32.5 g/dL (ref 30.0–36.0)
MCV: 79.8 fL (ref 78.0–100.0)
PLATELETS: 377 10*3/uL (ref 150–400)
RBC: 4.51 MIL/uL (ref 3.87–5.11)
RDW: 16.9 % — AB (ref 11.5–15.5)
WBC: 9.8 10*3/uL (ref 4.0–10.5)

## 2014-09-08 LAB — TROPONIN I: Troponin I: <0.3 ng/mL (ref <0.30)

## 2014-09-08 LAB — PRO B NATRIURETIC PEPTIDE: PRO B NATRI PEPTIDE: 1531 pg/mL — AB (ref 0–450)

## 2014-09-08 LAB — BASIC METABOLIC PANEL
ANION GAP: 12 (ref 5–15)
BUN: 33 mg/dL — AB (ref 6–23)
CHLORIDE: 94 meq/L — AB (ref 96–112)
CO2: 28 mEq/L (ref 19–32)
Calcium: 8.4 mg/dL (ref 8.4–10.5)
Creatinine, Ser: 0.99 mg/dL (ref 0.50–1.10)
GFR, EST AFRICAN AMERICAN: 62 mL/min — AB (ref >90)
GFR, EST NON AFRICAN AMERICAN: 54 mL/min — AB (ref >90)
Glucose, Bld: 110 mg/dL — ABNORMAL HIGH (ref 70–99)
Potassium: 4.8 mEq/L (ref 3.7–5.3)
SODIUM: 134 meq/L — AB (ref 137–147)

## 2014-09-08 NOTE — ED Notes (Signed)
Patient arrives from [facility], c/o chest pain, mid chest, squeezing in nature. Reports pain started at lunch time and has been intermittent since. Shortness of breath. H/o malignant pleural effusions, had right side pleural effusion drained ( approximately 2 liters) yesterday. Sent for further eval.

## 2014-09-08 NOTE — ED Provider Notes (Signed)
CSN: [id number]     Arrival date & time 09/08/14  1849 History  This chart was scribed for Sarah Diego, MD by Lowella Petties, ED Scribe. The patient was seen in room APA08/APA08. Patient's care was started at 7:44 PM.    Chief Complaint  Patient presents with  . Chest Pain   Patient is a 79 y.o. female presenting with chest pain. The history is provided by the patient. No language interpreter was used.  Chest Pain Pain location:  Substernal area and epigastric Pain quality: aching   Pain radiates to:  Does not radiate Pain radiates to the back: no   Pain severity:  Moderate Onset quality:  Gradual Timing:  Constant Progression:  Unchanged Relieved by:  Nothing Worsened by:  Nothing tried Ineffective treatments:  None tried Associated symptoms: shortness of breath   Associated symptoms: no abdominal pain, no back pain, no cough, no fatigue and no headache    HPI Comments: [name] is a 79 y.o. female who presents to the Emergency Department complaining of central, squeezing, chest pain onset around lunch time earlier today. She reports associated SOB. She reports a history of malignant pleural effusions and had a right sided pleural effusion drained yesterday. She reports wearing 2L O2 at the [facility].   Past Medical History  Diagnosis Date  . GERD (gastroesophageal reflux disease)   . Osteoarthritis   . Peripheral edema   . CHF (congestive heart failure), NYHA class IV     diastolic  . Cancer    Past Surgical History  Procedure Laterality Date  . Cholecystectomy    . Cardiac catheterization    . Total abdominal hysterectomy  40 years ago    patient does not know if ovaries were removed  . Appendectomy    . Total knee arthroplasty Left   . Chest tube insertion Left 08/04/2014    Procedure: INSERTION PLEURAL DRAINAGE CATHETER;  Surgeon: Ivin Poot, MD;  Location: [place];  Service: Thoracic;  Laterality: Left;   No family history on file. History  Substance  Use Topics  . Smoking status: Never Smoker   . Smokeless tobacco: Not on file  . Alcohol Use: No   OB History   Grav Para Term Preterm Abortions TAB SAB Ect Mult Living                 Review of Systems  Constitutional: Negative for appetite change and fatigue.  HENT: Negative for congestion, ear discharge and sinus pressure.   Eyes: Negative for discharge.  Respiratory: Positive for shortness of breath. Negative for cough.   Cardiovascular: Positive for chest pain.  Gastrointestinal: Negative for abdominal pain and diarrhea.  Genitourinary: Negative for frequency and hematuria.  Musculoskeletal: Negative for back pain.  Skin: Negative for rash.  Neurological: Negative for seizures and headaches.  Psychiatric/Behavioral: Negative for hallucinations.    Allergies  Penicillins  Home Medications   Prior to Admission medications   Medication Sig Start Date End Date Taking? Authorizing Provider  docusate sodium (COLACE) 100 MG capsule Take 100 mg by mouth 2 (two) times daily.   Yes Historical Provider, MD  HYDROcodone-acetaminophen (NORCO/VICODIN) 5-325 MG per tablet Take 1 tablet by mouth See admin instructions. Take 1 tablet by mouth twice daily.  Make take 1 tablet every 6 hours as needed for pain.   Yes Historical Provider, MD  metoCLOPramide (REGLAN) 5 MG tablet Take 5 mg by mouth 4 (four) times daily.   Yes Historical  Provider, MD  Nutritional Supplements (ENSURE CLEAR) LIQD Take 1 Bottle by mouth 3 (three) times daily.   Yes Historical Provider, MD  pantoprazole (PROTONIX) 40 MG tablet Take 1 tablet (40 mg total) by mouth daily. 08/12/14  Yes Sarah Diego, MD  potassium chloride (K-DUR) 10 MEQ tablet Take 20 mEq by mouth 2 (two) times daily.   Yes Historical Provider, MD   Triage Vitals: BP 114/75  Pulse 106  Temp(Src) 98 F (36.7 C) (Oral)  Resp 30  Ht 5\' 5"  (1.651 m)  Wt 137 lb (62.143 kg)  BMI 22.80 kg/m2  SpO2 100% Physical Exam  Constitutional: She is oriented  to person, place, and time.  Cachectic   HENT:  Head: Normocephalic.  Eyes: Conjunctivae and EOM are normal. No scleral icterus.  Neck: Neck supple. No thyromegaly present.  Cardiovascular: Normal rate and regular rhythm.  Exam reveals no gallop and no friction rub.   No murmur heard. Pulmonary/Chest: No stridor. She has wheezes (bilateral, mild). She has no rales. She exhibits no tenderness.  Abdominal: She exhibits distension. There is no tenderness. There is no rebound.  Musculoskeletal: Normal range of motion. She exhibits no edema.  Lymphadenopathy:    She has no cervical adenopathy.  Neurological: She is oriented to person, place, and time. She exhibits normal muscle tone. Coordination normal.  Skin: No rash noted. No erythema.  Psychiatric: She has a normal mood and affect. Her behavior is normal.    ED Course  Procedures (including critical care time) DIAGNOSTIC STUDIES: Oxygen Saturation is 100% on room air, normal by my interpretation.    COORDINATION OF CARE: 7:48 PM-Discussed treatment plan which includes CXR, Troponin, and CBC with pt at bedside and pt agreed to plan.   Labs Review Labs Reviewed  TROPONIN I  CBC  BASIC METABOLIC PANEL  PRO B NATRIURETIC PEPTIDE    Imaging Review Dg Chest 2 View  09/07/2014   CLINICAL DATA:  Malignant pleural effusion.  EXAM: CHEST  2 VIEW  COMPARISON:  August 19, 2014.  FINDINGS: Interval placement of right internal jugular Port-A-Cath with distal tip in expected position of the SVC. Moderate left pleural effusion is again noted and stable. Right pleural effusion is significantly enlarged compared to prior exam. No pneumothorax is noted.  IMPRESSION: Large right pleural effusion is noted which is significantly increased in size compared to prior exam. Stable mild left pleural effusion.   Electronically Signed   By: Sabino Dick M.D.   On: 09/07/2014 10:43   Dg Chest 2v Repeat Same Day  09/07/2014   CLINICAL DATA:  Malignant  right pleural effusion, right thoracentesis  EXAM: CHEST - 2 VIEW SAME DAY  COMPARISON:  Chest x-ray of 09/07/2014  FINDINGS: There are small bilateral pleural effusions present with significant reduction in the volume of the right pleural effusion after right thoracentesis. No pneumothorax is seen. Heart size is stable. The bones are somewhat osteopenic.  IMPRESSION: Significant decrease in volume of the right pleural effusion after right thoracentesis. No pneumothorax.   Electronically Signed   By: Ivar Drape M.D.   On: 09/07/2014 13:17     EKG Interpretation None      MDM   Final diagnoses:  None    Pleural effusion,   Hypoxia.  The chart was scribed for me under my direct supervision.  I personally performed the history, physical, and medical decision making and all procedures in the evaluation of this patient.Sarah Diego, MD  09/08/14 2156 

## 2014-09-08 NOTE — ED Notes (Signed)
Patients family reports to RN station, stating patient is having a difficult time breathing. EDP notified and at bedside at this time. Patients O2 saturations remain 97-98% on [facility]

## 2014-09-08 NOTE — H&P (Signed)
History and Physical  [name] IFO:[id number] DOB: [birth year] DOA: 09/08/2014  Referring physician: Dr Roderic Palau, ED physician PCP: [facility]   Chief Complaint: Shortness of breath  HPI: [name] is a 79 y.o. female  With a history of diastolic CHF, ovarian cancer with a malignant pleural effusions, status post left Pleurx catheter. The patient was recently seen by Dr. Prescott Gum of CT surgery who removed a pleural effusion on the right side. The patient began having worsening shortness of breath earlier today and was accompanied by nonradiating sharp right-sided chest pain that started earlier today. Chest pain disease and is mild currently, but she continues to complain of labored breathing that is worse with exertion. The shortness of breath is improved with rest. She has had a Port-A-Cath placed in preparation for chemotherapy, which is to start on Monday. She and has been having increased O2 requirements and has been on nasal cannula the majority of the day.   Review of Systems:   Pt complains of chest pain, shortness of breath, dyspnea on exertion, edema in the lower extremities.  Pt denies any fevers, chills, nausea, vomiting, cough, bone pain, nausea, vomiting.  Review of systems are otherwise negative  Past Medical History  Diagnosis Date  . GERD (gastroesophageal reflux disease)   . Osteoarthritis   . Peripheral edema   . CHF (congestive heart failure), NYHA class IV     diastolic  . Cancer    Past Surgical History  Procedure Laterality Date  . Cholecystectomy    . Cardiac catheterization    . Total abdominal hysterectomy  40 years ago    patient does not know if ovaries were removed  . Appendectomy    . Total knee arthroplasty Left   . Chest tube insertion Left 08/04/2014    Procedure: INSERTION PLEURAL DRAINAGE CATHETER;  Surgeon: Ivin Poot, MD;  Location: [place];  Service: Thoracic;  Laterality: Left;   Social History:  reports that she has never smoked.  She does not have any smokeless tobacco history on file. She reports that she does not drink alcohol or use illicit drugs. Patient lives at [facility] & is able to participate in activities of daily living with assistance  Allergies  Allergen Reactions  . Penicillins     Unknown childhood reaction    No family history on file.    Prior to Admission medications   Medication Sig Start Date End Date Taking? Authorizing Provider  docusate sodium (COLACE) 100 MG capsule Take 100 mg by mouth 2 (two) times daily.   Yes Historical Provider, MD  HYDROcodone-acetaminophen (NORCO/VICODIN) 5-325 MG per tablet Take 1 tablet by mouth See admin instructions. Take 1 tablet by mouth twice daily.  Make take 1 tablet every 6 hours as needed for pain.   Yes Historical Provider, MD  metoCLOPramide (REGLAN) 5 MG tablet Take 5 mg by mouth 4 (four) times daily.   Yes Historical Provider, MD  Nutritional Supplements (ENSURE CLEAR) LIQD Take 1 Bottle by mouth 3 (three) times daily.   Yes Historical Provider, MD  pantoprazole (PROTONIX) 40 MG tablet Take 1 tablet (40 mg total) by mouth daily. 08/12/14  Yes Maudry Diego, MD  potassium chloride (K-DUR) 10 MEQ tablet Take 20 mEq by mouth 2 (two) times daily.   Yes Historical Provider, MD    Physical Exam: BP 117/80  Pulse 105  Temp(Src) 98 F (36.7 C) (Oral)  Resp 24  Ht 5\' 5"  (1.651 m)  Wt  62.143 kg (137 lb)  BMI 22.80 kg/m2  SpO2 99%  General: Elderly Caucasian female. Awake and alert and oriented x3. No acute cardiopulmonary distress.  Eyes: Pupils equal, round, reactive to light. Extraocular muscles are intact. Sclerae anicteric and noninjected.  ENT:  Moist mucosal membranes. No mucosal lesions.  Neck: Neck supple without lymphadenopathy. No carotid bruits. No masses palpated.  Cardiovascular: Regular rate with normal S1-S2 sounds. No murmurs, rubs, gallops auscultated. No JVD.  Respiratory: Diminished breath sounds, particularly on right in  particular in the lower lung fields. There some mild rales auscultated bilaterally. Abdomen: Soft, nontender, nondistended. Active bowel sounds. No masses or hepatosplenomegaly  Skin: Dry, warm to touch. 2+ dorsalis pedis and radial pulses. 2+ edema in the lower extremities bilaterally. Musculoskeletal: No calf or leg pain. All major joints not erythematous nontender.  Psychiatric: Intact judgment and insight.  Neurologic: No focal neurological deficits. Cranial nerves II through XII are grossly intact.           Labs on Admission:  Basic Metabolic Panel:  Recent Labs Lab 09/08/14 1959  NA 134*  K 4.8  CL 94*  CO2 28  GLUCOSE 110*  BUN 33*  CREATININE 0.99  CALCIUM 8.4   Liver Function Tests: No results found for this basename: AST, ALT, ALKPHOS, BILITOT, PROT, ALBUMIN,  in the last 168 hours No results found for this basename: LIPASE, AMYLASE,  in the last 168 hours No results found for this basename: AMMONIA,  in the last 168 hours CBC:  Recent Labs Lab 09/08/14 1959  WBC 9.8  HGB 11.7*  HCT 36.0  MCV 79.8  PLT 377   Cardiac Enzymes:  Recent Labs Lab 09/08/14 1959  TROPONINI <0.30    BNP (last 3 results)  Recent Labs  07/27/14 1942 09/08/14 1959  PROBNP 456.7* 1531.0*   CBG: No results found for this basename: GLUCAP,  in the last 168 hours  Radiological Exams on Admission: Dg Chest 2 View  09/07/2014   CLINICAL DATA:  Malignant pleural effusion.  EXAM: CHEST  2 VIEW  COMPARISON:  August 19, 2014.  FINDINGS: Interval placement of right internal jugular Port-A-Cath with distal tip in expected position of the SVC. Moderate left pleural effusion is again noted and stable. Right pleural effusion is significantly enlarged compared to prior exam. No pneumothorax is noted.  IMPRESSION: Large right pleural effusion is noted which is significantly increased in size compared to prior exam. Stable mild left pleural effusion.   Electronically Signed   By: Sabino Dick M.D.   On: 09/07/2014 10:43   Dg Chest 2v Repeat Same Day  09/07/2014   CLINICAL DATA:  Malignant right pleural effusion, right thoracentesis  EXAM: CHEST - 2 VIEW SAME DAY  COMPARISON:  Chest x-ray of 09/07/2014  FINDINGS: There are small bilateral pleural effusions present with significant reduction in the volume of the right pleural effusion after right thoracentesis. No pneumothorax is seen. Heart size is stable. The bones are somewhat osteopenic.  IMPRESSION: Significant decrease in volume of the right pleural effusion after right thoracentesis. No pneumothorax.   Electronically Signed   By: Ivar Drape M.D.   On: 09/07/2014 13:17   Dg Chest Port 1 View  09/08/2014   CLINICAL DATA:  Intermittent central chest pain and shortness of breath. Right-sided thoracentesis yesterday. Initial presentation.  EXAM: PORTABLE CHEST - 1 VIEW  COMPARISON:  Two-view chest x-ray 09/07/2014.  FINDINGS: The heart borders are obscured. The right pleural effusion has re-accumulated, opacifying  2/3 the right hemi thorax. A small moderate left pleural effusion is present as well. Mild diffuse edema is present.  IMPRESSION: 1. Re-accumulation of right pleural effusion similar or increased to the level prior to the thoracentesis. 2. Small left pleural effusion is stable. 3. Increased edema.   Electronically Signed   By: Lawrence Santiago M.D.   On: 09/08/2014 20:08    Assessment/Plan Present on Admission:  . Malignant pleural effusion  #1 shortness of breath/ acute on chronic respiratory failure with #2 malignant pleural effusion #3 acute on chronic diastolic heart failure Combination of malignant pleural effusion and acute on chronic heart failure. Admit the patient's with telemetry and diuresed with Lasix. Will have patient undergo ultrasound guided thoracentesis. She may need a Pleurx catheter if this continues to be a chronic problem. We'll obtain BMP in the morning to assure that with diuresis she does not become  hypokalemic and that her creatinine remains in the normal range.  #4 nausea Continue Reglan and Zofran  DVT prophylaxis: SCDs - patient going for procedure tomorrow so will hold Lovenox  Consultants: None  Code Status: Full code  Family Communication: Son and daughter in the room   Disposition Plan: Return to consider after treatment  Time spent: 2 minutes  [place], [place] [facility] Pager [phone]  **Disclaimer: This note may have been dictated with voice recognition software. Similar sounding words can inadvertently be transcribed and this note may contain transcription errors which may not have been corrected upon publication of note.**

## 2014-09-09 ENCOUNTER — Encounter (HOSPITAL_COMMUNITY): Payer: Self-pay

## 2014-09-09 ENCOUNTER — Observation Stay (HOSPITAL_COMMUNITY): Payer: Medicare HMO

## 2014-09-09 ENCOUNTER — Telehealth (HOSPITAL_COMMUNITY): Payer: Self-pay | Admitting: *Deleted

## 2014-09-09 DIAGNOSIS — D649 Anemia, unspecified: Secondary | ICD-10-CM | POA: Diagnosis present

## 2014-09-09 DIAGNOSIS — J910 Malignant pleural effusion: Secondary | ICD-10-CM | POA: Diagnosis present

## 2014-09-09 DIAGNOSIS — R110 Nausea: Secondary | ICD-10-CM | POA: Diagnosis present

## 2014-09-09 DIAGNOSIS — M1990 Unspecified osteoarthritis, unspecified site: Secondary | ICD-10-CM | POA: Diagnosis present

## 2014-09-09 DIAGNOSIS — C569 Malignant neoplasm of unspecified ovary: Secondary | ICD-10-CM | POA: Diagnosis present

## 2014-09-09 DIAGNOSIS — Z6821 Body mass index (BMI) 21.0-21.9, adult: Secondary | ICD-10-CM | POA: Diagnosis not present

## 2014-09-09 DIAGNOSIS — C786 Secondary malignant neoplasm of retroperitoneum and peritoneum: Secondary | ICD-10-CM | POA: Diagnosis present

## 2014-09-09 DIAGNOSIS — C482 Malignant neoplasm of peritoneum, unspecified: Secondary | ICD-10-CM

## 2014-09-09 DIAGNOSIS — Z96652 Presence of left artificial knee joint: Secondary | ICD-10-CM | POA: Diagnosis present

## 2014-09-09 DIAGNOSIS — K219 Gastro-esophageal reflux disease without esophagitis: Secondary | ICD-10-CM | POA: Diagnosis present

## 2014-09-09 DIAGNOSIS — Z66 Do not resuscitate: Secondary | ICD-10-CM | POA: Diagnosis present

## 2014-09-09 DIAGNOSIS — J90 Pleural effusion, not elsewhere classified: Secondary | ICD-10-CM | POA: Diagnosis present

## 2014-09-09 DIAGNOSIS — I5033 Acute on chronic diastolic (congestive) heart failure: Secondary | ICD-10-CM | POA: Diagnosis present

## 2014-09-09 DIAGNOSIS — R64 Cachexia: Secondary | ICD-10-CM | POA: Diagnosis present

## 2014-09-09 DIAGNOSIS — J9621 Acute and chronic respiratory failure with hypoxia: Secondary | ICD-10-CM | POA: Diagnosis present

## 2014-09-09 LAB — CBC
HEMATOCRIT: 35.7 % — AB (ref 36.0–46.0)
Hemoglobin: 11.4 g/dL — ABNORMAL LOW (ref 12.0–15.0)
MCH: 25.7 pg — ABNORMAL LOW (ref 26.0–34.0)
MCHC: 31.9 g/dL (ref 30.0–36.0)
MCV: 80.6 fL (ref 78.0–100.0)
Platelets: 331 10*3/uL (ref 150–400)
RBC: 4.43 MIL/uL (ref 3.87–5.11)
RDW: 16.9 % — AB (ref 11.5–15.5)
WBC: 7.6 10*3/uL (ref 4.0–10.5)

## 2014-09-09 LAB — BASIC METABOLIC PANEL
Anion gap: 9 (ref 5–15)
BUN: 31 mg/dL — ABNORMAL HIGH (ref 6–23)
CO2: 30 meq/L (ref 19–32)
CREATININE: 1.05 mg/dL (ref 0.50–1.10)
Calcium: 8.2 mg/dL — ABNORMAL LOW (ref 8.4–10.5)
Chloride: 96 mEq/L (ref 96–112)
GFR calc Af Amer: 58 mL/min — ABNORMAL LOW (ref >90)
GFR calc non Af Amer: 50 mL/min — ABNORMAL LOW (ref >90)
Glucose, Bld: 102 mg/dL — ABNORMAL HIGH (ref 70–99)
Potassium: 4.7 mEq/L (ref 3.7–5.3)
SODIUM: 135 meq/L — AB (ref 137–147)

## 2014-09-09 LAB — MRSA PCR SCREENING: MRSA by PCR: NEGATIVE

## 2014-09-09 MED ORDER — METOCLOPRAMIDE HCL 5 MG PO TABS
5.0000 mg | ORAL_TABLET | Freq: Four times a day (QID) | ORAL | Status: DC
  Administered 2014-09-09 – 2014-09-14 (×17): 5 mg via ORAL
  Filled 2014-09-09 (×23): qty 1

## 2014-09-09 MED ORDER — DOCUSATE SODIUM 100 MG PO CAPS
100.0000 mg | ORAL_CAPSULE | Freq: Two times a day (BID) | ORAL | Status: DC
  Administered 2014-09-09 – 2014-09-14 (×10): 100 mg via ORAL
  Filled 2014-09-09 (×15): qty 1

## 2014-09-09 MED ORDER — CETYLPYRIDINIUM CHLORIDE 0.05 % MT LIQD
7.0000 mL | Freq: Two times a day (BID) | OROMUCOSAL | Status: DC
  Administered 2014-09-09 – 2014-09-14 (×10): 7 mL via OROMUCOSAL

## 2014-09-09 MED ORDER — HYDROCODONE-ACETAMINOPHEN 5-325 MG PO TABS
1.0000 | ORAL_TABLET | Freq: Four times a day (QID) | ORAL | Status: DC | PRN
  Administered 2014-09-10 – 2014-09-14 (×4): 1 via ORAL
  Filled 2014-09-09 (×4): qty 1

## 2014-09-09 MED ORDER — PANTOPRAZOLE SODIUM 40 MG PO TBEC
40.0000 mg | DELAYED_RELEASE_TABLET | Freq: Every day | ORAL | Status: DC
  Administered 2014-09-09 – 2014-09-14 (×5): 40 mg via ORAL
  Filled 2014-09-09 (×6): qty 1

## 2014-09-09 MED ORDER — FUROSEMIDE 10 MG/ML IJ SOLN
20.0000 mg | Freq: Two times a day (BID) | INTRAMUSCULAR | Status: DC
  Administered 2014-09-09 – 2014-09-13 (×10): 20 mg via INTRAVENOUS
  Filled 2014-09-09 (×12): qty 2

## 2014-09-09 MED ORDER — ALUM & MAG HYDROXIDE-SIMETH 200-200-20 MG/5ML PO SUSP
30.0000 mL | Freq: Four times a day (QID) | ORAL | Status: DC | PRN
  Administered 2014-09-11: 30 mL via ORAL
  Filled 2014-09-09: qty 30

## 2014-09-09 MED ORDER — ACETAMINOPHEN 325 MG PO TABS
650.0000 mg | ORAL_TABLET | Freq: Four times a day (QID) | ORAL | Status: DC | PRN
  Administered 2014-09-13: 650 mg via ORAL
  Filled 2014-09-09: qty 2

## 2014-09-09 MED ORDER — MORPHINE SULFATE 2 MG/ML IJ SOLN
1.0000 mg | INTRAMUSCULAR | Status: DC | PRN
  Administered 2014-09-13 (×2): 1 mg via INTRAVENOUS
  Filled 2014-09-09 (×2): qty 1

## 2014-09-09 MED ORDER — ONDANSETRON HCL 4 MG/2ML IJ SOLN
4.0000 mg | Freq: Four times a day (QID) | INTRAMUSCULAR | Status: DC | PRN
  Administered 2014-09-09: 4 mg via INTRAVENOUS
  Filled 2014-09-09: qty 2

## 2014-09-09 MED ORDER — HYDROCODONE-ACETAMINOPHEN 5-325 MG PO TABS
1.0000 | ORAL_TABLET | Freq: Two times a day (BID) | ORAL | Status: DC
  Administered 2014-09-09 – 2014-09-13 (×10): 1 via ORAL
  Filled 2014-09-09 (×11): qty 1

## 2014-09-09 MED ORDER — ACETAMINOPHEN 650 MG RE SUPP: 650.0000 mg | Freq: Four times a day (QID) | RECTAL | Status: DC | PRN

## 2014-09-09 MED ORDER — LORAZEPAM 2 MG/ML IJ SOLN
0.5000 mg | Freq: Once | INTRAMUSCULAR | Status: AC
  Administered 2014-09-09: 0.5 mg via INTRAVENOUS
  Filled 2014-09-09: qty 1

## 2014-09-09 MED ORDER — DOCUSATE SODIUM 100 MG PO CAPS
100.0000 mg | ORAL_CAPSULE | Freq: Every day | ORAL | Status: DC | PRN
Start: 2014-09-09 — End: 2014-09-14

## 2014-09-09 MED ORDER — SODIUM CHLORIDE 0.9 % IJ SOLN
3.0000 mL | Freq: Two times a day (BID) | INTRAMUSCULAR | Status: DC
  Administered 2014-09-09 – 2014-09-11 (×6): 3 mL via INTRAVENOUS

## 2014-09-09 NOTE — Telephone Encounter (Signed)
Patient is currently an inpatient here @ [facility]. We will not be administering chemo on Monday due to patient's acute condition per [facility]. Chemo appt cancelled. This was communicated to patient's daughter Benjamine Mola "Raynelle Highland". She verbalized understanding of these instructions.

## 2014-09-09 NOTE — Procedures (Signed)
PreOperative Dx: Recurrent RIGHT pleural effusion Postoperative Dx: Recurrent RIGHT pleural effusion Procedure:   US guided RIGHT thoracentesis Radiologist:  Thornton Papas Anesthesia:  5 ml of 1 lidocaine Specimen:  1600 ml of yellow colored fluid EBL:   < 1 ml Complications: None

## 2014-09-09 NOTE — Clinical Social Work Psychosocial (Signed)
Clinical Social Work Department BRIEF PSYCHOSOCIAL ASSESSMENT 09/09/2014  Patient:  [name], [name]     Account Number:  [id number]     Admit date:  09/08/2014  Clinical Social Worker:  Wyatt Haste  Date/Time:  09/09/2014 11:10 AM  Referred by:  CSW  Date Referred:  09/09/2014 Referred for  SNF Placement   Other Referral:   Interview type:  Patient Other interview type:   daughter and son    PSYCHOSOCIAL DATA Living Status:  FACILITY Admitted from facility:  [facility] Level of care:  [place] Primary support name:  Tim Primary support relationship to patient:  CHILD, ADULT Degree of support available:   very supportive    CURRENT CONCERNS Current Concerns  Post-Acute Placement   Other Concerns:    SOCIAL WORK ASSESSMENT / PLAN CSW met with pt at bedside. Pt very sleepy and not participating much in conversation. CSW spoke with pt's daughter, Benjamine Mola who reports her brother, Octavia Bruckner is Economist. Family report pt was diagnosed with stage IV ovarian cancer while in the hospital about a month ago. She has a port-a-cath and is supposed to start chemo on Monday. Pt went to [facility] last month after hospitalization. Per Marianna Fuss there, pt is private pay as insurance would not authorize because she is too weak to participate in any therapy. She has pleurx catheter. Pt came to ED yesterday due to chest pain. Admitted with combination of malignant pleural effusion and acute on chronic heart failure. Benjamine Mola said [place] yesterday discussed possibility of not starting treatment and considering hospice. They plan to discuss further with MD today. CSW spoke with pt's son Octavia Bruckner. He states family lives nearby and visit daily. Pt was living alone prior to last hospital stay. [facility] request [facility] as they work and are unable to provide around the clock care for pt. Tim indicates they are coping "best we can" at this point regarding cancer diagnosis. Brief support provided. Per  Marianna Fuss, pt is okay to return at d/c and no FL2 is needed.   Assessment/plan status:  Psychosocial Support/Ongoing Assessment of Needs Other assessment/ plan:   Information/referral to community resources:   [facility]    PATIENT'S/FAMILY'S RESPONSE TO PLAN OF CARE: Pt unable to discuss plan of care at time of CSW visit. Family plan to discuss with MD today. CSW will continue to follow. Anticipate return to [facility] at this point.       Benay Pike, [place]

## 2014-09-09 NOTE — Progress Notes (Signed)
[facility] PROGRESS NOTE  [name] WHQ:[id number] DOB: March 17, 1935 DOA: 09/08/2014 PCP: [name], MD  Assessment/Plan: Recurrent Malignant Bilateral Pleural Effusions -S/p left pleurex catheter. -Had right thoracentesis 10/7 yielding 2 L and again on 10/9 yielding 1.6 L. -Discussed with Dr. Prescott Gum, we agree she needs a right pleurex as well. -Will transfer to [facility] today.  Ovarian Cancer -Scheduled to start chemo next week. -Discussed with cancer center at [place]; they will defer chemo for now but plan on meeting family 10/12 at 11;30 am for further discussion at family's request.  Acute Respiratory Failure -2/2 malignant pleural effusion. -See above for details. -SOB improved after thoracentesis  Code Status: DNR Family Communication: Discussed with multiple family members at bedside. Disposition Plan: Transfer to [facility], to be evaluated by thoracic surgery   Consultants:  Thoracic surgery, Dr. Prescott Gum   Antibiotics:  None   Subjective: Shortness of breath improved after thoracentesis.  Objective: Filed Vitals:   09/09/14 0057 09/09/14 0709 09/09/14 1239 09/09/14 1300  BP: 115/68 113/68 108/75 94/65  Pulse: 100 108 107 103  Temp: 97.4 F (36.3 C) 97.7 F (36.5 C)    TempSrc: Axillary Oral    Resp: 30 28 22 22   Height:      Weight:      SpO2: 98% 95% 95% 95%   No intake or output data in the 24 hours ending 09/09/14 1541 Filed Weights   09/08/14 1852 09/09/14 0036  Weight: 62.143 kg (137 lb) 61.9 kg (136 lb 7.4 oz)    Exam:   General:  Alert, awake, oriented x3  Cardiovascular: Regular rate and rhythm  Respiratory: Decreased bibasilar breath sounds  Abdomen: Soft, nontender, nondistended, positive bowel sounds  Extremities: No clubbing, cyanosis or edema, positive pulses   Neurologic:  Grossly intact and nonfocal  Data Reviewed: Basic Metabolic Panel:  Recent Labs Lab 09/08/14 1959 09/09/14 0621  NA 134* 135*  K  4.8 4.7  CL 94* 96  CO2 28 30  GLUCOSE 110* 102*  BUN 33* 31*  CREATININE 0.99 1.05  CALCIUM 8.4 8.2*   Liver Function Tests: No results found for this basename: AST, ALT, ALKPHOS, BILITOT, PROT, ALBUMIN,  in the last 168 hours No results found for this basename: LIPASE, AMYLASE,  in the last 168 hours No results found for this basename: AMMONIA,  in the last 168 hours CBC:  Recent Labs Lab 09/08/14 1959 09/09/14 0621  WBC 9.8 7.6  HGB 11.7* 11.4*  HCT 36.0 35.7*  MCV 79.8 80.6  PLT 377 331   Cardiac Enzymes:  Recent Labs Lab 09/08/14 1959  TROPONINI <0.30   BNP (last 3 results)  Recent Labs  07/27/14 1942 09/08/14 1959  PROBNP 456.7* 1531.0*   CBG: No results found for this basename: GLUCAP,  in the last 168 hours  Recent Results (from the past 240 hour(s))  MRSA PCR SCREENING     Status: None   Collection Time    09/09/14  1:00 AM      Result Value Ref Range Status   MRSA by PCR NEGATIVE  NEGATIVE Final   Comment:            The GeneXpert MRSA Assay (FDA     approved for NASAL specimens     only), is one component of a     comprehensive MRSA colonization     surveillance program. It is not     intended to diagnose MRSA     infection  nor to guide or     monitor treatment for     MRSA infections.     Studies: Dg Chest 1 View  09/09/2014   CLINICAL DATA:  RIGHT pleural effusion post thoracentesis  EXAM: CHEST - 1 VIEW  COMPARISON:  Earlier exam of 09/08/2014  FINDINGS: RIGHT jugular Port-A-Cath stable tip projecting over RIGHT atrium.  Enlargement of cardiac silhouette with pulmonary vascular congestion.  Bibasilar effusions and atelectasis greater on RIGHT.  RIGHT pleural effusion has decreased since the earlier study of 09/08/2014.  No pneumothorax.  Several skin folds project over lower RIGHT chest, confirmed on repeat image.  Atherosclerotic calcification at aortic arch.  Diffuse osseous demineralization.  IMPRESSION: No pneumothorax following RIGHT  thoracentesis.  Bibasilar effusions and atelectasis greater on RIGHT, with improved aeration at RIGHT base versus earlier study   Electronically Signed   By: Lavonia Dana M.D.   On: 09/09/2014 13:38   Dg Chest Port 1 View  09/08/2014   CLINICAL DATA:  Intermittent central chest pain and shortness of breath. Right-sided thoracentesis yesterday. Initial presentation.  EXAM: PORTABLE CHEST - 1 VIEW  COMPARISON:  Two-view chest x-ray 09/07/2014.  FINDINGS: The heart borders are obscured. The right pleural effusion has re-accumulated, opacifying 2/3 the right hemi thorax. A small moderate left pleural effusion is present as well. Mild diffuse edema is present.  IMPRESSION: 1. Re-accumulation of right pleural effusion similar or increased to the level prior to the thoracentesis. 2. Small left pleural effusion is stable. 3. Increased edema.   Electronically Signed   By: Lawrence Santiago M.D.   On: 09/08/2014 20:08   US Thoracentesis Asp Pleural Space W/img Guide  09/09/2014   CLINICAL DATA:  Recurrent RIGHT pleural effusion  EXAM: ULTRASOUND GUIDED THERAPEUTIC RIGHT THORACENTESIS  COMPARISON:  None.  PROCEDURE: Procedure, benefits, and risks of procedure were discussed with patient.  Written informed consent for procedure was obtained.  Time out protocol followed.  Pleural effusion localized by ultrasound at the posterior RIGHT hemithorax.  Skin prepped and draped in usual sterile fashion.  Skin and soft tissues anesthetized with 5 mL of 1% lidocaine.  8 French thoracentesis catheter placed into the RIGHT pleural space.  1600 mL of yellow fluid aspirated by syringe pump.  Procedure tolerated very well by patient without immediate complication.  FINDINGS: A total of approximately 1600 mL of yellow RIGHT pleural fluid was removed. A fluid sample of 180 mL wassent for laboratory analysis.  IMPRESSION: Successful ultrasound guided RIGHT thoracentesis yielding 1600 mL of pleural fluid.   Electronically Signed   By: Lavonia Dana M.D.   On: 09/09/2014 13:48    Scheduled Meds: . antiseptic oral rinse  7 mL Mouth Rinse BID  . docusate sodium  100 mg Oral BID  . furosemide  20 mg Intravenous BID  . HYDROcodone-acetaminophen  1 tablet Oral BID  . metoCLOPramide  5 mg Oral QID  . pantoprazole  40 mg Oral Daily  . sodium chloride  3 mL Intravenous Q12H   Continuous Infusions:   Active Problems:   Malignant pleural effusion   Acute on chronic congestive heart failure   Acute on chronic respiratory failure with hypoxia   Ovarian cancer    Time spent: 45 minutes. Greater than 50% of this time was spent in direct contact with the patient coordinating care.    Lelon Frohlich  [facility] Pager [phone]  If 7PM-7AM, please contact night-coverage at [email or web address], password [facility] 09/09/2014, 3:41 PM  LOS: 1 day

## 2014-09-09 NOTE — Progress Notes (Signed)
Thoracentesis complete no signs of distress. 1600 ml yellow colored pleural fluid removed.  

## 2014-09-10 DIAGNOSIS — C569 Malignant neoplasm of unspecified ovary: Principal | ICD-10-CM

## 2014-09-10 DIAGNOSIS — J910 Malignant pleural effusion: Secondary | ICD-10-CM

## 2014-09-10 DIAGNOSIS — E43 Unspecified severe protein-calorie malnutrition: Secondary | ICD-10-CM

## 2014-09-10 DIAGNOSIS — Z9889 Other specified postprocedural states: Secondary | ICD-10-CM

## 2014-09-10 DIAGNOSIS — D638 Anemia in other chronic diseases classified elsewhere: Secondary | ICD-10-CM

## 2014-09-10 MED ORDER — LORAZEPAM 0.5 MG PO TABS
0.5000 mg | ORAL_TABLET | Freq: Once | ORAL | Status: AC
  Administered 2014-09-10: 0.5 mg via ORAL
  Filled 2014-09-10: qty 1

## 2014-09-10 MED ORDER — SODIUM CHLORIDE 0.9 % IJ SOLN
10.0000 mL | INTRAMUSCULAR | Status: DC | PRN
  Administered 2014-09-11: 10 mL
  Administered 2014-09-12: 20 mL
  Administered 2014-09-14: 10 mL

## 2014-09-10 NOTE — Progress Notes (Signed)
[facility] progress note. Chief complaint. Transfer note. History of present illness. This elderly 79 year old female in hospital at [facility] with recurrent malignant bilateral pleural effusions. Patient was status post left Pleurx catheter. Patient had right thoracentesis yielding 2 L on 10 7/15 and again 1.6 L 09/10/15. Patient is felt to benefit from placement of right Pleurx catheter and has been transferred to [facility] for this purpose. Patient has arrived in transfer and I'm seeing her at bedside to ensure she remains clinically stable and that all orders transferred appropriately. The patient has no current medical complaints. Physical exam. Vital signs. Temperature 98.2, pulse 90, respiration 22, blood pressure 96/62. O2 sats 97% on low-flow nasocannula oxygen. General appearance. Thin/frail elderly female who is alert and in no distress. Cardiac. Rate and rhythm regular. Lungs. Breath sounds absent left base and reduced right base. No evidence of dyspnea and O2 sats remained stable. Impression/plan. Problem #1. Recurrent bilateral pleural effusions. Patient arrived in transfer for a right Pleurx catheter placement. She appears clinically stable at this time and all orders appear to of transferred appropriately.

## 2014-09-10 NOTE — Progress Notes (Addendum)
Patient arrieved from [facility] via [facility]. Pt is A&Ox4. Pt's son at bedside. Pt oriented to unit and room. Pt has bruising to bilateral arms. Pt has pleurx catheter to left side of chest with gauze dressing that DD&I. Pt has gauze dressing to rt back where she had thoracentesis done today that is DD&I.  Notified admissions that pt has transferred here from [facility]. Notified IV team that pt has porta cath to right chest that is accessed.  Pt placed on telemetry with continous pulse ox. Pt is on 4L of O2, son states she has been on 4L since August. Will continue to monitor pt. Ranelle Oyster, RN

## 2014-09-10 NOTE — Progress Notes (Signed)
[facility]                                                                              Patient Demographics  [name], is a 79 y.o. female, DOB - 08-Mar-1935, JJK:[id number]  Admit date - 09/08/2014   Admitting Physician Truett Mainland, DO  Outpatient Primary MD for the patient is HAWKINS,EDWARD Carlean Jews, MD  LOS - 2   Chief Complaint  Patient presents with  . Chest Pain      HPI on 09/08/2014 [name] is a 79 y.o. female with a history of diastolic CHF, ovarian cancer with a malignant pleural effusions, status post left Pleurx catheter. The patient was recently seen by Dr. Prescott Gum of CT surgery who removed a pleural effusion on the right side. The patient began having worsening shortness of breath earlier today and was accompanied by nonradiating sharp right-sided chest pain that started earlier today. Chest pain was mild upon admission, but she continued to complain of labored breathing that which worse with exertion. The shortness of breath improved with rest. She has had a Port-A-Cath placed in preparation for chemotherapy, which is to start on Monday. She has been having increased O2 requirements and has been on nasal cannula the majority of the day.   Assessment & Plan   Recurrent Malignant Bilateral Pleural Effusions  -S/p left pleurex catheter.  -Had right thoracentesis 10/7 yielding 2 L and again on 10/9 yielding 1.6 L.  -Discussed with Dr. Prescott Gum, we agree she needs a right pleurex as well.  -Will transfer to [facility] today.   Ovarian Cancer  -Scheduled to start chemo next week.  -Discussed with cancer center at [facility]; they will defer chemo for now but plan on meeting family 10/12 at 11;30 am for further discussion at family's request.   Acute Respiratory Failure  -2/2 malignant pleural effusion.  -See above for details.  -SOB improved after thoracentesis  Chronic diastolic dysfunction -Noted on Echocardiogram 07/28/2014 -Currently euvolemic -Do not believe that  the pleural effusions are related to CHF, but rather ovarian cancer.   Chronic Anemia -Hemoglobin appears to be stable continue to monitor CBC.  Code Status: DNR  Family Communication: None at bedside  Disposition Plan: Admitted, pending placement of right pleurex   Time Spent in minutes   30 minutes  Procedures  [place] Right Thoracentesis  Consults   Radiology Cardiothoracic Surgery, Dr. Lucianne Lei Tright  DVT Prophylaxis  SCDs  Lab Results  Component Value Date   PLT 331 09/09/2014    Medications  Scheduled Meds: . antiseptic oral rinse  7 mL Mouth Rinse BID  . docusate sodium  100 mg Oral BID  . furosemide  20 mg Intravenous BID  . HYDROcodone-acetaminophen  1 tablet Oral BID  . metoCLOPramide  5 mg Oral QID  . pantoprazole  40 mg Oral Daily  . sodium chloride  3 mL Intravenous Q12H   Continuous Infusions:  PRN Meds:.acetaminophen, acetaminophen, alum & mag hydroxide-simeth, docusate sodium, HYDROcodone-acetaminophen, morphine injection, ondansetron (ZOFRAN) IV  Antibiotics    Anti-infectives   None      Subjective:   [name] seen and examined today.  Patient states that her  breathing has improved since having the thoracentesis.  She denies any chest pain, abdominal pain, headache, or dizziness.  Objective:   Filed Vitals:   09/09/14 1300 09/09/14 2229 09/09/14 2349 09/10/14 0515  BP: 94/65 99/55 96/62  107/60  Pulse: 103 100 98 90  Temp:  98 F (36.7 C) 98.2 F (36.8 C) 97.5 F (36.4 C)  TempSrc:  Oral Oral Oral  Resp: 22 21 22 20   Height:   5\' 5"  (1.651 m)   Weight:   59.6 kg (131 lb 6.3 oz)   SpO2: 95% 98% 97% 97%    Wt Readings from Last 3 Encounters:  09/09/14 59.6 kg (131 lb 6.3 oz)  09/07/14 64.864 kg (143 lb)  08/23/14 65.273 kg (143 lb 14.4 oz)     Intake/Output Summary (Last 24 hours) at 09/10/14 1034 Last data filed at 09/10/14 0916  Gross per 24 hour  Intake    480 ml  Output      0 ml  Net    480 ml    Exam  General: Well  developed, well nourished, NAD, appears stated age  HEENT: NCAT, PERRLA, EOMI, Anicteic Sclera, mucous membranes moist.   Cardiovascular: S1 S2 auscultated, no rubs, murmurs or gallops. Regular rate and rhythm.  Respiratory: Diminished but clear breath sounds.  No wheezing noted  Abdomen: Soft, nontender, nondistended, + bowel sounds  Extremities: warm dry without cyanosis clubbing or edema  Neuro: AAOx3, no focal deficits  Psych: Normal affect and demeanor with intact judgement and insight  Data Review   Micro Results Recent Results (from the past 240 hour(s))  MRSA PCR SCREENING     Status: None   Collection Time    09/09/14  1:00 AM      Result Value Ref Range Status   MRSA by PCR NEGATIVE  NEGATIVE Final   Comment:            The GeneXpert MRSA Assay (FDA     approved for NASAL specimens     only), is one component of a     comprehensive MRSA colonization     surveillance program. It is not     intended to diagnose MRSA     infection nor to guide or     monitor treatment for     MRSA infections.    Radiology Reports Dg Chest 1 View  09/09/2014   CLINICAL DATA:  RIGHT pleural effusion post thoracentesis  EXAM: CHEST - 1 VIEW  COMPARISON:  Earlier exam of 09/08/2014  FINDINGS: RIGHT jugular Port-A-Cath stable tip projecting over RIGHT atrium.  Enlargement of cardiac silhouette with pulmonary vascular congestion.  Bibasilar effusions and atelectasis greater on RIGHT.  RIGHT pleural effusion has decreased since the earlier study of 09/08/2014.  No pneumothorax.  Several skin folds project over lower RIGHT chest, confirmed on repeat image.  Atherosclerotic calcification at aortic arch.  Diffuse osseous demineralization.  IMPRESSION: No pneumothorax following RIGHT thoracentesis.  Bibasilar effusions and atelectasis greater on RIGHT, with improved aeration at RIGHT base versus earlier study   Electronically Signed   By: Lavonia Dana M.D.   On: 09/09/2014 13:38   Dg Chest 2  View  09/07/2014   CLINICAL DATA:  Malignant pleural effusion.  EXAM: CHEST  2 VIEW  COMPARISON:  August 19, 2014.  FINDINGS: Interval placement of right internal jugular Port-A-Cath with distal tip in expected position of the SVC. Moderate left pleural effusion is again noted and stable. Right pleural effusion is significantly enlarged  compared to prior exam. No pneumothorax is noted.  IMPRESSION: Large right pleural effusion is noted which is significantly increased in size compared to prior exam. Stable mild left pleural effusion.   Electronically Signed   By: Sabino Dick M.D.   On: 09/07/2014 10:43   Dg Chest 2 View  08/19/2014   CLINICAL DATA:  Shortness of Breath; pleural effusions  EXAM: CHEST  2 VIEW  COMPARISON:  August 12, 2014  FINDINGS: Pleural effusions, moderate, are again noted. There is bibasilar atelectatic change. There is atelectatic change in the left mid lung. There has been interval resolution of consolidation from the left mid lung. There is no new opacity compared to recent prior study.  Heart size and pulmonary vascularity are normal. No adenopathy. No bone lesions.  IMPRESSION: Persistent effusions without appreciable change from recent prior study. Interval resolution of left mid lung consolidation. Patchy atelectasis remains in this area. There is also atelectatic change in both lung bases. There is no change in cardiac silhouette.   Electronically Signed   By: Lowella Grip M.D.   On: 08/19/2014 17:00   Dg Esophagus  08/16/2014   CLINICAL DATA:  Dysphagia.  EXAM: ESOPHOGRAM / BARIUM SWALLOW / BARIUM TABLET STUDY  TECHNIQUE: Combined double contrast and single contrast examination performed using effervescent crystals, thick barium liquid, and thin barium liquid. The patient was observed with fluoroscopy swallowing a 62mm barium sulphate tablet.  FLUOROSCOPY TIME:  2 min 48 seconds.  COMPARISON:  None.  FINDINGS: No mass or stricture is noted in the esophagus. Moderate  sliding-type hiatal hernia is noted. Barium tablet passed without difficulty or delay through esophagus into stomach. No definite reflux is noted.  IMPRESSION: Moderate size sliding-type hiatal hernia is noted. No mass or stricture is noted in the esophagus.   Electronically Signed   By: Sabino Dick M.D.   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 She has been referred for port catheter placement  EXAM: IR RIGHT FLOURO GUIDE CV LINE; IR ULTRASOUND GUIDANCE VASC ACCESS RIGHT  Date: 08/30/2014  ANESTHESIA/SEDATION: Moderate (conscious) sedation was administered during this procedure. A total of 1.0 mg Versed and 50 mg Fentanyl were administered intravenously. The patient's vital signs were monitored continuously by radiology nursing throughout the course of the procedure.  Total sedation time: 30 minutes  FLUOROSCOPY TIME:  12 seconds  TECHNIQUE: The risks and benefits of the procedure were discussed with the patient with full informed consent and the patient wishes to proceed.  The right neck and chest was prepped with chlorhexidine, and draped in the usual sterile fashion using maximum barrier technique (cap and mask, sterile gown, sterile gloves, large sterile sheet, hand hygiene and cutaneous antiseptic). Antibiotic prophylaxis was provided with 1.0 g vancomycin administered IV one hour prior to skin incision. Local anesthesia was attained by infiltration with 1% lidocaine without epinephrine.  Ultrasound demonstrated patency of the right internal jugular vein, and this was documented with an image. Under real-time ultrasound guidance, this vein was accessed with a 21 gauge micropuncture needle and image documentation was performed. A small dermatotomy was made at the access site with an 11 scalpel. A 0.018" wire was advanced into the SVC and the access needle exchanged for a 35F micropuncture vascular sheath. The 0.018"  wire was then removed and a 0.035" wire advanced into the IVC.  An appropriate location for the subcutaneous reservoir was selected  below the clavicle and an incision was made through the skin and underlying soft tissues. The subcutaneous tissues were then dissected using a combination of blunt and sharp surgical technique and a pocket was formed. A single lumen power injectable portacatheter was then tunneled through the subcutaneous tissues from the pocket to the dermatotomy and the port reservoir placed within the subcutaneous pocket.  The venous access site was then serially dilated and a peel away vascular sheath placed over the wire. The wire was removed and the port catheter advanced into position under fluoroscopic guidance. The catheter tip is positioned in the cavoatrial junction. This was documented with a spot image. The portacatheter was then tested and found to flush and aspirate well. The port was flushed with saline followed by 100 units/mL heparinized saline.  The pocket was then closed in two layers using first subdermal inverted interrupted absorbable sutures followed by a running subcuticular suture. The epidermis was then sealed with Dermabond. The dermatotomy at the venous access site was also sealed with Dermabond.  COMPLICATIONS: None.  The patient tolerated the procedure well.  IMPRESSION: Status post placement of right IJ port catheter with the tip at the cavoatrial junction. The catheter is ready for use.  Signed,  Dulcy Fanny. Earleen Newport, DO  [facility]  [facility]   Electronically Signed   By: Corrie Mckusick O.D.   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 She has been referred for port catheter placement  EXAM: IR RIGHT FLOURO GUIDE CV LINE; IR ULTRASOUND GUIDANCE VASC ACCESS RIGHT  Date: 08/30/2014  ANESTHESIA/SEDATION: Moderate (conscious) sedation was  administered during this procedure. A total of 1.0 mg Versed and 50 mg Fentanyl were administered intravenously. The patient's vital signs were monitored continuously by radiology nursing throughout the course of the procedure.  Total sedation time: 30 minutes  FLUOROSCOPY TIME:  12 seconds  TECHNIQUE: The risks and benefits of the procedure were discussed with the patient with full informed consent and the patient wishes to proceed.  The right neck and chest was prepped with chlorhexidine, and draped in the usual sterile fashion using maximum barrier technique (cap and mask, sterile gown, sterile gloves, large sterile sheet, hand hygiene and cutaneous antiseptic). Antibiotic prophylaxis was provided with 1.0 g vancomycin administered IV one hour prior to skin incision. Local anesthesia was attained by infiltration with 1% lidocaine without epinephrine.  Ultrasound demonstrated patency of the right internal jugular vein, and this was documented with an image. Under real-time ultrasound guidance, this vein was accessed with a 21 gauge micropuncture needle and image documentation was performed. A small dermatotomy was made at the access site with an 11 scalpel. A 0.018" wire was advanced into the SVC and the access needle exchanged for a 13F micropuncture vascular sheath. The 0.018" wire was then removed and a 0.035" wire advanced into the IVC.  An appropriate location for the subcutaneous reservoir was selected below the clavicle and an incision was made through the skin and underlying soft tissues. The subcutaneous tissues were then dissected using a combination of blunt and sharp surgical technique and a pocket was formed. A single lumen power injectable portacatheter was then tunneled through the subcutaneous tissues from the pocket to the dermatotomy and the port reservoir placed within the subcutaneous pocket.  The venous access site was then serially dilated and a peel away vascular sheath placed over the wire.  The wire was removed and the port catheter advanced into position under fluoroscopic guidance. The catheter tip is positioned in the cavoatrial junction. This was documented with a spot image. The portacatheter was then tested and found to flush and aspirate well. The port was flushed with saline followed by 100 units/mL heparinized saline.  The pocket was then closed in two layers using first subdermal inverted interrupted absorbable sutures followed by a running subcuticular suture. The epidermis was then sealed with Dermabond. The dermatotomy at the venous access site was also sealed with Dermabond.  COMPLICATIONS: None.  The patient tolerated the procedure well.  IMPRESSION: Status post placement of right IJ port catheter with the tip at the cavoatrial junction. The catheter is ready for use.  Signed,  Dulcy Fanny. Earleen Newport, DO  [facility]  [facility]   Electronically Signed   By: Corrie Mckusick O.D.   On: 08/30/2014 14:39   Dg Chest 2v Repeat Same Day  09/07/2014   CLINICAL DATA:  Malignant right pleural effusion, right thoracentesis  EXAM: CHEST - 2 VIEW SAME DAY  COMPARISON:  Chest x-ray of 09/07/2014  FINDINGS: There are small bilateral pleural effusions present with significant reduction in the volume of the right pleural effusion after right thoracentesis. No pneumothorax is seen. Heart size is stable. The bones are somewhat osteopenic.  IMPRESSION: Significant decrease in volume of the right pleural effusion after right thoracentesis. No pneumothorax.   Electronically Signed   By: Ivar Drape M.D.   On: 09/07/2014 13:17   Dg Chest Port 1 View  09/08/2014   CLINICAL DATA:  Intermittent central chest pain and shortness of breath. Right-sided thoracentesis yesterday. Initial presentation.  EXAM: PORTABLE CHEST - 1 VIEW  COMPARISON:  Two-view chest x-ray 09/07/2014.  FINDINGS: The heart borders are obscured. The right pleural effusion has re-accumulated,  opacifying 2/3 the right hemi thorax. A small moderate left pleural effusion is present as well. Mild diffuse edema is present.  IMPRESSION: 1. Re-accumulation of right pleural effusion similar or increased to the level prior to the thoracentesis. 2. Small left pleural effusion is stable. 3. Increased edema.   Electronically Signed   By: Lawrence Santiago M.D.   On: 09/08/2014 20:08   Dg Chest Portable 1 View  08/12/2014   CLINICAL DATA:  Chest pain.  EXAM: PORTABLE CHEST - 1 VIEW  COMPARISON:  08/05/2014.  FINDINGS: The cardiac silhouette, mediastinal and hilar contours are stable. The lungs demonstrate persistent effusions and bibasilar atelectasis or infiltrates. No pneumothorax.  IMPRESSION: Persistent effusions and bilateral infiltrates or atelectasis.   Electronically Signed   By: Kalman Jewels M.D.   On: 08/12/2014 19:13   US Thoracentesis Asp Pleural Space W/img Guide  09/09/2014   CLINICAL DATA:  Recurrent RIGHT pleural effusion  EXAM: ULTRASOUND GUIDED THERAPEUTIC RIGHT THORACENTESIS  COMPARISON:  None.  PROCEDURE: Procedure, benefits, and risks of procedure were discussed with patient.  Written informed consent for procedure was obtained.  Time out protocol followed.  Pleural effusion localized by ultrasound at the posterior RIGHT hemithorax.  Skin prepped and draped in usual sterile fashion.  Skin and soft tissues anesthetized with 5 mL of 1% lidocaine.  8 French thoracentesis catheter placed into the RIGHT pleural space.  1600 mL of yellow fluid aspirated by syringe pump.  Procedure tolerated very well by patient without immediate complication.  FINDINGS: A total of approximately 1600 mL of yellow RIGHT pleural fluid was removed. A fluid sample of 180 mL wassent  for laboratory analysis.  IMPRESSION: Successful ultrasound guided RIGHT thoracentesis yielding 1600 mL of pleural fluid.   Electronically Signed   By: Lavonia Dana M.D.   On: 09/09/2014 13:48    CBC  Recent Labs Lab 09/08/14 1959  09/09/14 0621  WBC 9.8 7.6  HGB 11.7* 11.4*  HCT 36.0 35.7*  PLT 377 331  MCV 79.8 80.6  MCH 25.9* 25.7*  MCHC 32.5 31.9  RDW 16.9* 16.9*    Chemistries   Recent Labs Lab 09/08/14 1959 09/09/14 0621  NA 134* 135*  K 4.8 4.7  CL 94* 96  CO2 28 30  GLUCOSE 110* 102*  BUN 33* 31*  CREATININE 0.99 1.05  CALCIUM 8.4 8.2*   ------------------------------------------------------------------------------------------------------------------ estimated creatinine clearance is 40.4 ml/min (by C-G formula based on Cr of 1.05). ------------------------------------------------------------------------------------------------------------------ No results found for this basename: HGBA1C,  in the last 72 hours ------------------------------------------------------------------------------------------------------------------ No results found for this basename: CHOL, HDL, LDLCALC, TRIG, CHOLHDL, LDLDIRECT,  in the last 72 hours ------------------------------------------------------------------------------------------------------------------ No results found for this basename: TSH, T4TOTAL, FREET3, T3FREE, THYROIDAB,  in the last 72 hours ------------------------------------------------------------------------------------------------------------------ No results found for this basename: VITAMINB12, FOLATE, FERRITIN, TIBC, IRON, RETICCTPCT,  in the last 72 hours  Coagulation profile No results found for this basename: INR, PROTIME,  in the last 168 hours  No results found for this basename: DDIMER,  in the last 72 hours  Cardiac Enzymes  Recent Labs Lab 09/08/14 [place] <0.30   ------------------------------------------------------------------------------------------------------------------ No components found with this basename: POCBNP,     Kenden Brandt D.O. on 09/10/2014 at 10:34 AM  Between 7am to 7pm - Pager - [phone]  After 7pm go to [email or web address] - password  [email or web address]  And look for the night coverage person covering for me after hours  [facility] Office  [phone]

## 2014-09-11 DIAGNOSIS — J90 Pleural effusion, not elsewhere classified: Secondary | ICD-10-CM

## 2014-09-11 DIAGNOSIS — C579 Malignant neoplasm of female genital organ, unspecified: Secondary | ICD-10-CM

## 2014-09-11 LAB — BASIC METABOLIC PANEL
Anion gap: 14 (ref 5–15)
BUN: 26 mg/dL — AB (ref 6–23)
CHLORIDE: 94 meq/L — AB (ref 96–112)
CO2: 27 meq/L (ref 19–32)
Calcium: 7.8 mg/dL — ABNORMAL LOW (ref 8.4–10.5)
Creatinine, Ser: 0.95 mg/dL (ref 0.50–1.10)
GFR calc Af Amer: 65 mL/min — ABNORMAL LOW (ref >90)
GFR calc non Af Amer: 56 mL/min — ABNORMAL LOW (ref >90)
Glucose, Bld: 98 mg/dL (ref 70–99)
POTASSIUM: 3.8 meq/L (ref 3.7–5.3)
SODIUM: 135 meq/L — AB (ref 137–147)

## 2014-09-11 LAB — CBC
HEMATOCRIT: 32.3 % — AB (ref 36.0–46.0)
HEMOGLOBIN: 10.2 g/dL — AB (ref 12.0–15.0)
MCH: 25 pg — ABNORMAL LOW (ref 26.0–34.0)
MCHC: 31.6 g/dL (ref 30.0–36.0)
MCV: 79.2 fL (ref 78.0–100.0)
Platelets: 299 10*3/uL (ref 150–400)
RBC: 4.08 MIL/uL (ref 3.87–5.11)
RDW: 17 % — ABNORMAL HIGH (ref 11.5–15.5)
WBC: 5.8 10*3/uL (ref 4.0–10.5)

## 2014-09-11 NOTE — Progress Notes (Signed)
Pt has bp of 94/57. Pt is asymptomatic. Pt has sch vicodin and pt denies pain at this time. Paged to Fredirick Maudlin, NP and made aware and order received to give her pain med as her baseline is like in 90-100's. Will monitor.

## 2014-09-11 NOTE — Progress Notes (Signed)
[facility]                                                                              Patient Demographics  [name], is a 79 y.o. female, DOB - 27-Jan-1935, SEG:[id number]  Admit date - 09/08/2014   Admitting Physician Truett Mainland, DO  Outpatient Primary MD for the patient is HAWKINS,EDWARD Carlean Jews, MD  LOS - 3   Chief Complaint  Patient presents with  . Chest Pain      HPI on 09/08/2014 [name] Haselton is a 79 y.o. female with a history of diastolic CHF, ovarian cancer with a malignant pleural effusions, status post left Pleurx catheter. The patient was recently seen by Dr. Prescott Gum of CT surgery who removed a pleural effusion on the right side. The patient began having worsening shortness of breath earlier today and was accompanied by nonradiating sharp right-sided chest pain that started earlier today. Chest pain was mild upon admission, but she continued to complain of labored breathing that which worse with exertion. The shortness of breath improved with rest. She has had a Port-A-Cath placed in preparation for chemotherapy, which is to start on Monday. She has been having increased O2 requirements and has been on nasal cannula the majority of the day.   Assessment & Plan   Recurrent Malignant Bilateral Pleural Effusions  -S/p left pleurex catheter.  -Had right thoracentesis 10/7 yielding 2 L and again on 10/9 yielding 1.6 L.  -Previous hospitalist discussed with Dr. Prescott Gum, patient needs a right pleurex as well- possibly on Monday 10/12 -Patient was transferred to [facility].    Ovarian Cancer  -Scheduled to start chemo next week.  -Previous hospitalist discussed with cancer center at [place]; chemotherapy will be deferred until family meeting on  10/12 at 11:30 am  Acute Respiratory Failure  -Secondary to malignant pleural effusion.  -See above for details.  -Improved secondary to thoracentesis  Chronic diastolic dysfunction -Noted on Echocardiogram 07/28/2014 -Currently  euvolemic -Do not believe that the pleural effusions are related to CHF, but rather ovarian cancer.   Chronic Anemia -Hemoglobin appears to be stable continue to monitor CBC.  Code Status: DNR  Family Communication: None at bedside  Disposition Plan: Admitted, pending placement of right pleurex   Time Spent in minutes   30 minutes  Procedures  [place] Right Thoracentesis  Consults   Radiology Cardiothoracic Surgery, Dr. Lucianne Lei Tright  DVT Prophylaxis  SCDs  Lab Results  Component Value Date   PLT 299 09/11/2014    Medications  Scheduled Meds: . antiseptic oral rinse  7 mL Mouth Rinse BID  . docusate sodium  100 mg Oral BID  . furosemide  20 mg Intravenous BID  . HYDROcodone-acetaminophen  1 tablet Oral BID  . metoCLOPramide  5 mg Oral QID  . pantoprazole  40 mg Oral Daily  . sodium chloride  3 mL Intravenous Q12H   Continuous Infusions:  PRN Meds:.acetaminophen, acetaminophen, alum & mag hydroxide-simeth, docusate sodium, HYDROcodone-acetaminophen, morphine injection, ondansetron (ZOFRAN) IV, sodium chloride  Antibiotics    Anti-infectives   None      Subjective:   [name] seen and examined today.  Patient states that her  breathing has improved since having the thoracentesis.  She complains of a deep pain in her lungs, but states it does not affect her breathing.  She denies any chest pain, abdominal pain, headache, or dizziness.  Objective:   Filed Vitals:   09/10/14 0515 09/10/14 1500 09/10/14 2123 09/11/14 0524  BP: 107/60 91/60 105/65 100/63  Pulse: 90 67 96 90  Temp: 97.5 F (36.4 C) 97.3 F (36.3 C) 97.8 F (36.6 C) 98.1 F (36.7 C)  TempSrc: Oral Oral Oral Oral  Resp: 20 20 20 18   Height:      Weight:      SpO2: 97% 97% 96% 96%    Wt Readings from Last 3 Encounters:  09/09/14 59.6 kg (131 lb 6.3 oz)  09/07/14 64.864 kg (143 lb)  08/23/14 65.273 kg (143 lb 14.4 oz)     Intake/Output Summary (Last 24 hours) at 09/11/14 0934 Last data  filed at 09/11/14 0915  Gross per 24 hour  Intake    240 ml  Output    550 ml  Net   -310 ml    Exam  General: Well developed, well nourished, NAD, appears stated age  HEENT: NCAT, mucous membranes moist.   Cardiovascular: S1 S2 auscultated, RRR  Respiratory: Diminished but clear breath sounds.  No wheezing noted  Abdomen: Soft, nontender, nondistended, + bowel sounds  Extremities: warm dry without cyanosis clubbing or edema  Neuro: AAOx3, no focal deficits  Psych: Normal affect and demeanor  Data Review   Micro Results Recent Results (from the past 240 hour(s))  MRSA PCR SCREENING     Status: None   Collection Time    09/09/14  1:00 AM      Result Value Ref Range Status   MRSA by PCR NEGATIVE  NEGATIVE Final   Comment:            The GeneXpert MRSA Assay (FDA     approved for NASAL specimens     only), is one component of a     comprehensive MRSA colonization     surveillance program. It is not     intended to diagnose MRSA     infection nor to guide or     monitor treatment for     MRSA infections.    Radiology Reports Dg Chest 1 View  09/09/2014   CLINICAL DATA:  RIGHT pleural effusion post thoracentesis  EXAM: CHEST - 1 VIEW  COMPARISON:  Earlier exam of 09/08/2014  FINDINGS: RIGHT jugular Port-A-Cath stable tip projecting over RIGHT atrium.  Enlargement of cardiac silhouette with pulmonary vascular congestion.  Bibasilar effusions and atelectasis greater on RIGHT.  RIGHT pleural effusion has decreased since the earlier study of 09/08/2014.  No pneumothorax.  Several skin folds project over lower RIGHT chest, confirmed on repeat image.  Atherosclerotic calcification at aortic arch.  Diffuse osseous demineralization.  IMPRESSION: No pneumothorax following RIGHT thoracentesis.  Bibasilar effusions and atelectasis greater on RIGHT, with improved aeration at RIGHT base versus earlier study   Electronically Signed   By: Lavonia Dana M.D.   On: 09/09/2014 13:38   Dg  Chest 2 View  09/07/2014   CLINICAL DATA:  Malignant pleural effusion.  EXAM: CHEST  2 VIEW  COMPARISON:  August 19, 2014.  FINDINGS: Interval placement of right internal jugular Port-A-Cath with distal tip in expected position of the SVC. Moderate left pleural effusion is again noted and stable. Right pleural effusion is significantly enlarged compared to prior exam. No pneumothorax is  noted.  IMPRESSION: Large right pleural effusion is noted which is significantly increased in size compared to prior exam. Stable mild left pleural effusion.   Electronically Signed   By: Sabino Dick M.D.   On: 09/07/2014 10:43   Dg Chest 2 View  08/19/2014   CLINICAL DATA:  Shortness of Breath; pleural effusions  EXAM: CHEST  2 VIEW  COMPARISON:  August 12, 2014  FINDINGS: Pleural effusions, moderate, are again noted. There is bibasilar atelectatic change. There is atelectatic change in the left mid lung. There has been interval resolution of consolidation from the left mid lung. There is no new opacity compared to recent prior study.  Heart size and pulmonary vascularity are normal. No adenopathy. No bone lesions.  IMPRESSION: Persistent effusions without appreciable change from recent prior study. Interval resolution of left mid lung consolidation. Patchy atelectasis remains in this area. There is also atelectatic change in both lung bases. There is no change in cardiac silhouette.   Electronically Signed   By: Lowella Grip M.D.   On: 08/19/2014 17:00   Dg Esophagus  08/16/2014   CLINICAL DATA:  Dysphagia.  EXAM: ESOPHOGRAM / BARIUM SWALLOW / BARIUM TABLET STUDY  TECHNIQUE: Combined double contrast and single contrast examination performed using effervescent crystals, thick barium liquid, and thin barium liquid. The patient was observed with fluoroscopy swallowing a 55mm barium sulphate tablet.  FLUOROSCOPY TIME:  2 min 48 seconds.  COMPARISON:  None.  FINDINGS: No mass or stricture is noted in the esophagus.  Moderate sliding-type hiatal hernia is noted. Barium tablet passed without difficulty or delay through esophagus into stomach. No definite reflux is noted.  IMPRESSION: Moderate size sliding-type hiatal hernia is noted. No mass or stricture is noted in the esophagus.   Electronically Signed   By: Sabino Dick M.D.   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 She has been referred for port catheter placement  EXAM: IR RIGHT FLOURO GUIDE CV LINE; IR ULTRASOUND GUIDANCE VASC ACCESS RIGHT  Date: 08/30/2014  ANESTHESIA/SEDATION: Moderate (conscious) sedation was administered during this procedure. A total of 1.0 mg Versed and 50 mg Fentanyl were administered intravenously. The patient's vital signs were monitored continuously by radiology nursing throughout the course of the procedure.  Total sedation time: 30 minutes  FLUOROSCOPY TIME:  12 seconds  TECHNIQUE: The risks and benefits of the procedure were discussed with the patient with full informed consent and the patient wishes to proceed.  The right neck and chest was prepped with chlorhexidine, and draped in the usual sterile fashion using maximum barrier technique (cap and mask, sterile gown, sterile gloves, large sterile sheet, hand hygiene and cutaneous antiseptic). Antibiotic prophylaxis was provided with 1.0 g vancomycin administered IV one hour prior to skin incision. Local anesthesia was attained by infiltration with 1% lidocaine without epinephrine.  Ultrasound demonstrated patency of the right internal jugular vein, and this was documented with an image. Under real-time ultrasound guidance, this vein was accessed with a 21 gauge micropuncture needle and image documentation was performed. A small dermatotomy was made at the access site with an 11 scalpel. A 0.018" wire was advanced into the SVC and the access needle exchanged for a 55F micropuncture vascular sheath.  The 0.018" wire was then removed and a 0.035" wire advanced into the IVC.  An appropriate location for the subcutaneous reservoir was selected below the clavicle and an incision was  made through the skin and underlying soft tissues. The subcutaneous tissues were then dissected using a combination of blunt and sharp surgical technique and a pocket was formed. A single lumen power injectable portacatheter was then tunneled through the subcutaneous tissues from the pocket to the dermatotomy and the port reservoir placed within the subcutaneous pocket.  The venous access site was then serially dilated and a peel away vascular sheath placed over the wire. The wire was removed and the port catheter advanced into position under fluoroscopic guidance. The catheter tip is positioned in the cavoatrial junction. This was documented with a spot image. The portacatheter was then tested and found to flush and aspirate well. The port was flushed with saline followed by 100 units/mL heparinized saline.  The pocket was then closed in two layers using first subdermal inverted interrupted absorbable sutures followed by a running subcuticular suture. The epidermis was then sealed with Dermabond. The dermatotomy at the venous access site was also sealed with Dermabond.  COMPLICATIONS: None.  The patient tolerated the procedure well.  IMPRESSION: Status post placement of right IJ port catheter with the tip at the cavoatrial junction. The catheter is ready for use.  Signed,  Dulcy Fanny. Earleen Newport, DO  [facility]  [facility]   Electronically Signed   By: Corrie Mckusick O.D.   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 She has been referred for port catheter placement  EXAM: IR RIGHT FLOURO GUIDE CV LINE; IR ULTRASOUND GUIDANCE VASC ACCESS RIGHT  Date: 08/30/2014  ANESTHESIA/SEDATION: Moderate (conscious)  sedation was administered during this procedure. A total of 1.0 mg Versed and 50 mg Fentanyl were administered intravenously. The patient's vital signs were monitored continuously by radiology nursing throughout the course of the procedure.  Total sedation time: 30 minutes  FLUOROSCOPY TIME:  12 seconds  TECHNIQUE: The risks and benefits of the procedure were discussed with the patient with full informed consent and the patient wishes to proceed.  The right neck and chest was prepped with chlorhexidine, and draped in the usual sterile fashion using maximum barrier technique (cap and mask, sterile gown, sterile gloves, large sterile sheet, hand hygiene and cutaneous antiseptic). Antibiotic prophylaxis was provided with 1.0 g vancomycin administered IV one hour prior to skin incision. Local anesthesia was attained by infiltration with 1% lidocaine without epinephrine.  Ultrasound demonstrated patency of the right internal jugular vein, and this was documented with an image. Under real-time ultrasound guidance, this vein was accessed with a 21 gauge micropuncture needle and image documentation was performed. A small dermatotomy was made at the access site with an 11 scalpel. A 0.018" wire was advanced into the SVC and the access needle exchanged for a 47F micropuncture vascular sheath. The 0.018" wire was then removed and a 0.035" wire advanced into the IVC.  An appropriate location for the subcutaneous reservoir was selected below the clavicle and an incision was made through the skin and underlying soft tissues. The subcutaneous tissues were then dissected using a combination of blunt and sharp surgical technique and a pocket was formed. A single lumen power injectable portacatheter was then tunneled through the subcutaneous tissues from the pocket to the dermatotomy and the port reservoir placed within the subcutaneous pocket.  The venous access site was then serially dilated and a peel away vascular sheath placed  over the wire. The wire was removed and the  port catheter advanced into position under fluoroscopic guidance. The catheter tip is positioned in the cavoatrial junction. This was documented with a spot image. The portacatheter was then tested and found to flush and aspirate well. The port was flushed with saline followed by 100 units/mL heparinized saline.  The pocket was then closed in two layers using first subdermal inverted interrupted absorbable sutures followed by a running subcuticular suture. The epidermis was then sealed with Dermabond. The dermatotomy at the venous access site was also sealed with Dermabond.  COMPLICATIONS: None.  The patient tolerated the procedure well.  IMPRESSION: Status post placement of right IJ port catheter with the tip at the cavoatrial junction. The catheter is ready for use.  Signed,  Dulcy Fanny. Earleen Newport, DO  [facility]  [facility]   Electronically Signed   By: Corrie Mckusick O.D.   On: 08/30/2014 14:39   Dg Chest 2v Repeat Same Day  09/07/2014   CLINICAL DATA:  Malignant right pleural effusion, right thoracentesis  EXAM: CHEST - 2 VIEW SAME DAY  COMPARISON:  Chest x-ray of 09/07/2014  FINDINGS: There are small bilateral pleural effusions present with significant reduction in the volume of the right pleural effusion after right thoracentesis. No pneumothorax is seen. Heart size is stable. The bones are somewhat osteopenic.  IMPRESSION: Significant decrease in volume of the right pleural effusion after right thoracentesis. No pneumothorax.   Electronically Signed   By: Ivar Drape M.D.   On: 09/07/2014 13:17   Dg Chest Port 1 View  09/08/2014   CLINICAL DATA:  Intermittent central chest pain and shortness of breath. Right-sided thoracentesis yesterday. Initial presentation.  EXAM: PORTABLE CHEST - 1 VIEW  COMPARISON:  Two-view chest x-ray 09/07/2014.  FINDINGS: The heart borders are obscured. The right pleural effusion has  re-accumulated, opacifying 2/3 the right hemi thorax. A small moderate left pleural effusion is present as well. Mild diffuse edema is present.  IMPRESSION: 1. Re-accumulation of right pleural effusion similar or increased to the level prior to the thoracentesis. 2. Small left pleural effusion is stable. 3. Increased edema.   Electronically Signed   By: Lawrence Santiago M.D.   On: 09/08/2014 20:08   Dg Chest Portable 1 View  08/12/2014   CLINICAL DATA:  Chest pain.  EXAM: PORTABLE CHEST - 1 VIEW  COMPARISON:  08/05/2014.  FINDINGS: The cardiac silhouette, mediastinal and hilar contours are stable. The lungs demonstrate persistent effusions and bibasilar atelectasis or infiltrates. No pneumothorax.  IMPRESSION: Persistent effusions and bilateral infiltrates or atelectasis.   Electronically Signed   By: Kalman Jewels M.D.   On: 08/12/2014 19:13   US Thoracentesis Asp Pleural Space W/img Guide  09/09/2014   CLINICAL DATA:  Recurrent RIGHT pleural effusion  EXAM: ULTRASOUND GUIDED THERAPEUTIC RIGHT THORACENTESIS  COMPARISON:  None.  PROCEDURE: Procedure, benefits, and risks of procedure were discussed with patient.  Written informed consent for procedure was obtained.  Time out protocol followed.  Pleural effusion localized by ultrasound at the posterior RIGHT hemithorax.  Skin prepped and draped in usual sterile fashion.  Skin and soft tissues anesthetized with 5 mL of 1% lidocaine.  8 French thoracentesis catheter placed into the RIGHT pleural space.  1600 mL of yellow fluid aspirated by syringe pump.  Procedure tolerated very well by patient without immediate complication.  FINDINGS: A total of approximately 1600 mL of yellow RIGHT pleural fluid was removed. A fluid sample of 180 mL wassent for laboratory analysis.  IMPRESSION: Successful  ultrasound guided RIGHT thoracentesis yielding 1600 mL of pleural fluid.   Electronically Signed   By: Lavonia Dana M.D.   On: 09/09/2014 13:48    CBC  Recent Labs Lab  09/08/14 1959 09/09/14 0621 09/11/14 0550  WBC 9.8 7.6 5.8  HGB 11.7* 11.4* 10.2*  HCT 36.0 35.7* 32.3*  PLT 377 331 299  MCV 79.8 80.6 79.2  MCH 25.9* 25.7* 25.0*  MCHC 32.5 31.9 31.6  RDW 16.9* 16.9* 17.0*    Chemistries   Recent Labs Lab 09/08/14 1959 09/09/14 0621 09/11/14 0550  NA 134* 135* 135*  K 4.8 4.7 3.8  CL 94* 96 94*  CO2 28 30 27   GLUCOSE 110* 102* 98  BUN 33* 31* 26*  CREATININE 0.99 1.05 0.95  CALCIUM 8.4 8.2* 7.8*   ------------------------------------------------------------------------------------------------------------------ estimated creatinine clearance is 44.6 ml/min (by C-G formula based on Cr of 0.95). ------------------------------------------------------------------------------------------------------------------ No results found for this basename: HGBA1C,  in the last 72 hours ------------------------------------------------------------------------------------------------------------------ No results found for this basename: CHOL, HDL, LDLCALC, TRIG, CHOLHDL, LDLDIRECT,  in the last 72 hours ------------------------------------------------------------------------------------------------------------------ No results found for this basename: TSH, T4TOTAL, FREET3, T3FREE, THYROIDAB,  in the last 72 hours ------------------------------------------------------------------------------------------------------------------ No results found for this basename: VITAMINB12, FOLATE, FERRITIN, TIBC, IRON, RETICCTPCT,  in the last 72 hours  Coagulation profile No results found for this basename: INR, PROTIME,  in the last 168 hours  No results found for this basename: DDIMER,  in the last 72 hours  Cardiac Enzymes  Recent Labs Lab 09/08/14 [place] <0.30   ------------------------------------------------------------------------------------------------------------------ No components found with this basename: POCBNP,     Derron Pipkins  D.O. on 09/11/2014 at 9:34 AM  Between 7am to 7pm - Pager - [phone]  After 7pm go to [email or web address] - password [email or web address]  And look for the night coverage person covering for me after hours  [facility] Office  [phone]

## 2014-09-12 ENCOUNTER — Encounter: Payer: Self-pay | Admitting: Internal Medicine

## 2014-09-12 ENCOUNTER — Ambulatory Visit (HOSPITAL_COMMUNITY): Payer: Medicare HMO

## 2014-09-12 ENCOUNTER — Inpatient Hospital Stay (HOSPITAL_COMMUNITY): Payer: Medicare HMO

## 2014-09-12 ENCOUNTER — Inpatient Hospital Stay (HOSPITAL_COMMUNITY): Payer: Medicare HMO | Admitting: Oncology

## 2014-09-12 DIAGNOSIS — C482 Malignant neoplasm of peritoneum, unspecified: Secondary | ICD-10-CM

## 2014-09-12 DIAGNOSIS — J910 Malignant pleural effusion: Secondary | ICD-10-CM

## 2014-09-12 LAB — CBC
HCT: 32 % — ABNORMAL LOW (ref 36.0–46.0)
HCT: 33.5 % — ABNORMAL LOW (ref 36.0–46.0)
HEMOGLOBIN: 10.3 g/dL — AB (ref 12.0–15.0)
Hemoglobin: 10.8 g/dL — ABNORMAL LOW (ref 12.0–15.0)
MCH: 25.4 pg — AB (ref 26.0–34.0)
MCH: 25.5 pg — ABNORMAL LOW (ref 26.0–34.0)
MCHC: 32.2 g/dL (ref 30.0–36.0)
MCHC: 32.2 g/dL (ref 30.0–36.0)
MCV: 78.8 fL (ref 78.0–100.0)
MCV: 79.2 fL (ref 78.0–100.0)
Platelets: 277 10*3/uL (ref 150–400)
Platelets: 306 10*3/uL (ref 150–400)
RBC: 4.06 MIL/uL (ref 3.87–5.11)
RBC: 4.23 MIL/uL (ref 3.87–5.11)
RDW: 16.8 % — ABNORMAL HIGH (ref 11.5–15.5)
RDW: 16.9 % — AB (ref 11.5–15.5)
WBC: 6.4 10*3/uL (ref 4.0–10.5)
WBC: 6.7 10*3/uL (ref 4.0–10.5)

## 2014-09-12 LAB — BASIC METABOLIC PANEL
ANION GAP: 11 (ref 5–15)
BUN: 23 mg/dL (ref 6–23)
CALCIUM: 7.8 mg/dL — AB (ref 8.4–10.5)
CHLORIDE: 94 meq/L — AB (ref 96–112)
CO2: 29 mEq/L (ref 19–32)
CREATININE: 0.86 mg/dL (ref 0.50–1.10)
GFR calc Af Amer: 74 mL/min — ABNORMAL LOW (ref >90)
GFR calc non Af Amer: 64 mL/min — ABNORMAL LOW (ref >90)
GLUCOSE: 107 mg/dL — AB (ref 70–99)
Potassium: 3.8 mEq/L (ref 3.7–5.3)
Sodium: 134 mEq/L — ABNORMAL LOW (ref 137–147)

## 2014-09-12 LAB — SURGICAL PCR SCREEN
MRSA, PCR: NEGATIVE
STAPHYLOCOCCUS AUREUS: NEGATIVE

## 2014-09-12 MED ORDER — POLYETHYLENE GLYCOL 3350 17 G PO PACK
17.0000 g | PACK | Freq: Every day | ORAL | Status: DC
  Administered 2014-09-12 – 2014-09-14 (×2): 17 g via ORAL
  Filled 2014-09-12 (×3): qty 1

## 2014-09-12 MED ORDER — LEVOFLOXACIN IN D5W 750 MG/150ML IV SOLN
750.0000 mg | INTRAVENOUS | Status: AC
  Administered 2014-09-13: 750 mg via INTRAVENOUS
  Filled 2014-09-12: qty 150

## 2014-09-12 NOTE — Progress Notes (Signed)
CARE MANAGEMENT NOTE 09/12/2014  Patient:  [name], [name]   Account Number:  [id number]  Date Initiated:  09/12/2014  Documentation initiated by:  [facility]  Subjective/Objective Assessment:   Recurrent Malignant Bilateral Pleural Effusions     Action/Plan:   SNF   Anticipated DC Date:     Anticipated DC Plan:  [place] referral  Clinical Social Worker      DC Planning Services  CM consult      Choice offered to / List presented to:             Status of service:  In process, will continue to follow Medicare Important Message given?   (If response is "NO", the following Medicare IM given date fields will be blank) Date Medicare IM given:   Medicare IM given by:   Date Additional Medicare IM given:   Additional Medicare IM given by:    Discharge Disposition:  [place]  Per UR Regulation:    If discussed at Long Length of Stay Meetings, dates discussed:    Comments:  09/12/2014 1700 NCM placed [facility] pleurx drains application on chart for attending to complete. Will fax paperwork once complete to order drains for dc to SNF when medically ready. Placed patient consent form in pt's room for pt's son, Cambria Osten POA to sign agreeing to delivery of pleurx drains. Notified attending of forms on chart. Pleurx drains once delivered to hospital will go with pt to [facility]. SNF will reoder drains as needed. Jonnie Finner RN CCM Case Mgmt phone [phone]   09/12/2014 1000 NCM spoke to pt and gave permission to speak to son, Auri Jahnke, [place] # [phone]. Pt's sister, Stanton Kidney was in the room. Waiting for final recommendation for dc. CSW for SNF placement. Jonnie Finner RN CCM Case Mgmt phone [phone]

## 2014-09-12 NOTE — Consult Note (Signed)
[address]       [place] [address]             [phone]      Cardiothoracic Surgery Consultation  Reason for Consult: Metastatic ovarian cancer with malignant pleural effusion Referring Physician: Dr. Elmyra Ricks [name] is an 79 y.o. female.  HPI:   She has a history of metastatic ovarian cancer with a malignant left pleural effusion and underwent insertion of a left PleurX catheter by Dr. Darcey Nora on 08/04/2014. She has had minimal drainage since its insertion and was seen by him in the office last Wednesday 09/07/2014. A CXR at that time showed a large right pleural effusion that had progressed over the prior month. He performed a thoracentesis in the office and removed 2L of fluid. CXR afterward showed a significant decrease in the effusion. She did well at [facility] until Thursday when she developed right sided chest pain and shortness of breath. She was admitted to [facility] on early Friday morning after a CXR in the ER showed reaccumulation of the effusion. She had an US guided thoracentesis Friday removing 1.6L of fluid. A CXR afterward looked a little better but there was still sigificant effusion and atelectasis on the right. She was transferred to [facility] this weekend for further care. She says she is ok at rest in bed but gets short of breath with any activity like getting up to the bedside commode.   Past Medical History  Diagnosis Date  . GERD (gastroesophageal reflux disease)   . Osteoarthritis   . Peripheral edema   . CHF (congestive heart failure), NYHA class IV     diastolic  . Cancer     Past Surgical History  Procedure Laterality Date  . Cholecystectomy    . Cardiac catheterization    . Total abdominal hysterectomy  40 years ago    patient does not know if ovaries were removed  . Appendectomy    . Total knee arthroplasty Left   . Chest tube insertion Left 08/04/2014    Procedure: INSERTION PLEURAL DRAINAGE CATHETER;  Surgeon: Ivin Poot,  MD;  Location: [place];  Service: Thoracic;  Laterality: Left;    No family history on file.  Social History:  reports that she has never smoked. She does not have any smokeless tobacco history on file. She reports that she does not drink alcohol or use illicit drugs.  Allergies:  Allergies  Allergen Reactions  . Penicillins     Unknown childhood reaction    Medications:  I have reviewed the patient's current medications. Prior to Admission:  Prescriptions prior to admission  Medication Sig Dispense Refill  . docusate sodium (COLACE) 100 MG capsule Take 100 mg by mouth 2 (two) times daily.      Marland Kitchen HYDROcodone-acetaminophen (NORCO/VICODIN) 5-325 MG per tablet Take 1 tablet by mouth See admin instructions. Take 1 tablet by mouth twice daily.  Make take 1 tablet every 6 hours as needed for pain.      Marland Kitchen metoCLOPramide (REGLAN) 5 MG tablet Take 5 mg by mouth 4 (four) times daily.      . Nutritional Supplements (ENSURE CLEAR) LIQD Take 1 Bottle by mouth 3 (three) times daily.      . pantoprazole (PROTONIX) 40 MG tablet Take 1 tablet (40 mg total) by mouth daily.  30 tablet  0  . potassium chloride (K-DUR) 10 MEQ tablet Take 20 mEq by mouth 2 (two) times daily.  Scheduled: . antiseptic oral rinse  7 mL Mouth Rinse BID  . docusate sodium  100 mg Oral BID  . furosemide  20 mg Intravenous BID  . HYDROcodone-acetaminophen  1 tablet Oral BID  . metoCLOPramide  5 mg Oral QID  . pantoprazole  40 mg Oral Daily  . sodium chloride  3 mL Intravenous Q12H   Continuous:  OEU:[id number], acetaminophen, alum & mag hydroxide-simeth, docusate sodium, HYDROcodone-acetaminophen, morphine injection, ondansetron (ZOFRAN) IV, sodium chloride Anti-infectives   None      Results for orders placed during the hospital encounter of 09/08/14 (from the past 48 hour(s))  CBC     Status: Abnormal   Collection Time    09/11/14  5:50 AM      Result Value Ref Range   WBC 5.8  4.0 - 10.5 K/uL   RBC 4.08   3.87 - 5.11 MIL/uL   Hemoglobin 10.2 (*) 12.0 - 15.0 g/dL   HCT 32.3 (*) 36.0 - 46.0 %   MCV 79.2  78.0 - 100.0 fL   MCH 25.0 (*) 26.0 - 34.0 pg   MCHC 31.6  30.0 - 36.0 g/dL   RDW 17.0 (*) 11.5 - 15.5 %   Platelets 299  150 - 400 K/uL  BASIC METABOLIC PANEL     Status: Abnormal   Collection Time    09/11/14  5:50 AM      Result Value Ref Range   Sodium 135 (*) 137 - 147 mEq/L   Potassium 3.8  3.7 - 5.3 mEq/L   Chloride 94 (*) 96 - 112 mEq/L   CO2 27  19 - 32 mEq/L   Glucose, Bld 98  70 - 99 mg/dL   BUN 26 (*) 6 - 23 mg/dL   Creatinine, Ser 0.95  0.50 - 1.10 mg/dL   Calcium 7.8 (*) 8.4 - 10.5 mg/dL   GFR calc non Af Amer 56 (*) >90 mL/min   GFR calc Af Amer 65 (*) >90 mL/min   Comment: (NOTE)     The eGFR has been calculated using the CKD EPI equation.     This calculation has not been validated in all clinical situations.     eGFR's persistently <90 mL/min signify possible Chronic Kidney     Disease.   Anion gap 14  5 - 15  CBC     Status: Abnormal   Collection Time    09/12/14  5:20 AM      Result Value Ref Range   WBC 6.4  4.0 - 10.5 K/uL   RBC 4.06  3.87 - 5.11 MIL/uL   Hemoglobin 10.3 (*) 12.0 - 15.0 g/dL   HCT 32.0 (*) 36.0 - 46.0 %   MCV 78.8  78.0 - 100.0 fL   MCH 25.4 (*) 26.0 - 34.0 pg   MCHC 32.2  30.0 - 36.0 g/dL   RDW 16.8 (*) 11.5 - 15.5 %   Platelets 277  150 - 400 K/uL  BASIC METABOLIC PANEL     Status: Abnormal   Collection Time    09/12/14  5:20 AM      Result Value Ref Range   Sodium 134 (*) 137 - 147 mEq/L   Potassium 3.8  3.7 - 5.3 mEq/L   Chloride 94 (*) 96 - 112 mEq/L   CO2 29  19 - 32 mEq/L   Glucose, Bld 107 (*) 70 - 99 mg/dL   BUN 23  6 - 23 mg/dL   Creatinine, Ser 0.86  0.50 -  1.10 mg/dL   Calcium 7.8 (*) 8.4 - 10.5 mg/dL   GFR calc non Af Amer 64 (*) >90 mL/min   GFR calc Af Amer 74 (*) >90 mL/min   Comment: (NOTE)     The eGFR has been calculated using the CKD EPI equation.     This calculation has not been validated in all  clinical situations.     eGFR's persistently <90 mL/min signify possible Chronic Kidney     Disease.   Anion gap 11  5 - 15  CBC     Status: Abnormal   Collection Time    09/12/14 11:43 AM      Result Value Ref Range   WBC 6.7  4.0 - 10.5 K/uL   RBC 4.23  3.87 - 5.11 MIL/uL   Hemoglobin 10.8 (*) 12.0 - 15.0 g/dL   HCT 33.5 (*) 36.0 - 46.0 %   MCV 79.2  78.0 - 100.0 fL   MCH 25.5 (*) 26.0 - 34.0 pg   MCHC 32.2  30.0 - 36.0 g/dL   RDW 16.9 (*) 11.5 - 15.5 %   Platelets 306  150 - 400 K/uL    Dg Chest 2 View  09/12/2014   CLINICAL DATA:  Shortness of breath, weakness and recurrent right pleural effusion.  EXAM: CHEST  2 VIEW  COMPARISON:  09/09/2014  FINDINGS: There is a right jugular Port-A-Cath. Catheter tip in the lower SVC. There are persistent bilateral pleural effusions, right side greater the left. The right pleural effusion is moderate to large in size. Patient has a pleural drain at the left lung base. Position of this tube is unchanged. No evidence for pleural air. Evidence for compressive atelectasis bilaterally. Heart size is grossly stable but obscured by the pleural densities.  IMPRESSION: No significant change in the bilateral pleural effusions, right side greater the left.   Electronically Signed   By: Markus Daft M.D.   On: 09/12/2014 15:01    Review of Systems  Constitutional: Positive for weight loss. Negative for fever and chills.  HENT: Negative.   Eyes: Negative.   Respiratory: Positive for shortness of breath. Negative for cough, hemoptysis and sputum production.   Cardiovascular: Negative for orthopnea and PND.       Some sharp right sided chest pain.  Gastrointestinal: Negative.    Blood pressure 99/77, pulse 95, temperature 98 F (36.7 C), temperature source Oral, resp. rate 20, height 5' 5"  (1.651 m), weight 59.6 kg (131 lb 6.3 oz), SpO2 100.00%. Physical Exam  Constitutional: She is oriented to person, place, and time.  Thin, elderly woman in no distress.  Her sister is with her.  HENT:  Head: Normocephalic and atraumatic.  Mouth/Throat: Oropharynx is clear and moist.  Eyes: EOM are normal. Pupils are equal, round, and reactive to light.  Neck: Normal range of motion. Neck supple. No JVD present. No tracheal deviation present. No thyromegaly present.  Cardiovascular: Normal rate, regular rhythm and normal heart sounds.   No murmur heard. Respiratory: Effort normal. No respiratory distress.  Marked decrease in BS on right. Some rhonchi on right  GI: Soft. Bowel sounds are normal. She exhibits no distension. There is no tenderness.  Musculoskeletal: She exhibits no edema.  Lymphadenopathy:    She has no cervical adenopathy.  Neurological: She is alert and oriented to person, place, and time. No cranial nerve deficit or sensory deficit.  Skin: Skin is warm and dry.  Psychiatric: She has a normal mood and affect.  CLINICAL DATA: Shortness of breath, weakness and recurrent right  pleural effusion.  EXAM:  CHEST 2 VIEW  COMPARISON: 09/09/2014  FINDINGS:  There is a right jugular Port-A-Cath. Catheter tip in the lower SVC.  There are persistent bilateral pleural effusions, right side greater  the left. The right pleural effusion is moderate to large in size.  Patient has a pleural drain at the left lung base. Position of this  tube is unchanged. No evidence for pleural air. Evidence for  compressive atelectasis bilaterally. Heart size is grossly stable  but obscured by the pleural densities.  IMPRESSION:  No significant change in the bilateral pleural effusions, right side  greater the left.  Electronically Signed  By: Markus Daft M.D.  On: 09/12/2014 15:01        Assessment/Plan:  She has metastatic ovarian cancer with a malignant left pleural effusion treated with a Pleurx catheter and now a recurrent right pleural effusion after two thoracenteses over a 3 day period. I think insertion of a right Pleurx catheter is indicated to  allow complete drainage and reexpansion of the lung for symptom relief. Hopefully these effusions will resolve after chemotherapy and the catheters can be removed. I discussed the option of inserting a right pleurX catheter with the patient and her sister, alternatives of recurrent thoracentesis and no treatment, and risks of the procedure including infection, pneumothorax, catheter malfunction and incomplete drainage. She understands and agrees to proceed. I will do this tomorrow afternoon.  BARTLE,BRYAN K 09/12/2014, 3:26 PM

## 2014-09-12 NOTE — Progress Notes (Signed)
Occupational Therapy Evaluation Patient Details Name: [name] MRN: [id number] DOB: 11/24/1935 Today's Date: 09/12/2014    History of Present Illness [name] is a 79 y.o. female with a history of diastolic CHF, ovarian cancer with a malignant pleural effusions, status post left Pleurx catheter admitted with increasing SOB.   Clinical Impression   PTA pt was at [facility] for rehab. Pt required assistance for ADLs and was walking without assist until recently, when she became too weak. Pt currently requires min (A) for functional mobility and is limited by weakness. Pt will benefit from acute OT to increase endurance with ADL tasks and will benefit from return to SNF (prefers [facility]) for rehab.     Follow Up Recommendations  SNF;Supervision/Assistance - 24 hour    Equipment Recommendations  None recommended by OT    Recommendations for Other Services       Precautions / Restrictions Precautions Precautions: Fall Restrictions Weight Bearing Restrictions: No      Mobility Bed Mobility Overal bed mobility: Needs Assistance Bed Mobility: Supine to Sit;Sit to Supine     Supine to sit: Min guard Sit to supine: Min guard   General bed mobility comments: Use of bed rails. No Physical assist needed.   Transfers Overall transfer level: Needs assistance Equipment used: Rolling walker (2 wheeled) Transfers: Sit to/from [facility] Sit to Stand: Min assist Stand pivot transfers: Min assist       General transfer comment: Pt with good hand placement. Min (A) to boost to standing. Pt able to manage RW during stand-pivot.     Balance Overall balance assessment: Needs assistance Sitting-balance support: No upper extremity supported;Feet supported Sitting balance-Leahy Scale: Fair     Standing balance support: During functional activity;Single extremity supported Standing balance-Leahy Scale: Poor Standing balance comment: Pt able to remove one  hand from RW for pericare without LOB and close min guard.                             ADL Overall ADL's : Needs assistance/impaired Eating/Feeding: Sitting;Modified independent   Grooming: Wash/dry hands;Set up;Sitting   Upper Body Bathing: Set up;Sitting   Lower Body Bathing: Moderate assistance;Sit to/from stand   Upper Body Dressing : Minimal assistance;Sitting   Lower Body Dressing: Maximal assistance;Sit to/from stand   Toilet Transfer: Minimal assistance;Stand-pivot;BSC;RW   Toileting- Clothing Manipulation and Hygiene: Minimal assistance;Sit to/from stand Toileting - Clothing Manipulation Details (indicate cue type and reason): Min (A) to stand; Pt able to wipe using washcloth in Rt hand       General ADL Comments: Pt very pleasant and eager to cooperate. Pt is limited by weakness but requires only Min A to stand.      Vision  Pt reports no change from baseline.                    Perception Perception Perception Tested?: No   Praxis Praxis Praxis tested?: Within functional limits    Pertinent Vitals/Pain Pain Assessment: 0-10 Pain Score: 2  Faces Pain Scale: Hurts a little bit Pain Location: back Pain Descriptors / Indicators: Aching Pain Intervention(s): Monitored during session;Repositioned     Hand Dominance Right   Extremity/Trunk Assessment Upper Extremity Assessment Upper Extremity Assessment: Generalized weakness   Lower Extremity Assessment Lower Extremity Assessment: Generalized weakness   Cervical / Trunk Assessment Cervical / Trunk Assessment: Kyphotic   Communication Communication Communication: No difficulties  Cognition Arousal/Alertness: Awake/alert Behavior During Therapy: WFL for tasks assessed/performed Overall Cognitive Status: History of cognitive impairments - at baseline       Memory: Decreased short-term memory                        Home Living Family/patient expects to be discharged  to:: Skilled nursing facility [facility]                                 Additional Comments: Pt was I until Aug 26th when 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        Prior Functioning/Environment Level of Independence: Needs assistance  Gait / Transfers Assistance Needed: Pt was receiving therapy at SNF until a few weeks ago when she became too weak to participate with PT.  Before that she was walking with PT without AD. ADL's / Homemaking Assistance Needed: Pt required assist for ADLs.         OT Diagnosis: Generalized weakness   OT Problem List: Decreased strength;Decreased activity tolerance;Impaired balance (sitting and/or standing)   OT Treatment/Interventions: Self-care/ADL training;Therapeutic exercise;Energy conservation;DME and/or AE instruction;Therapeutic activities;Patient/family education;Balance training    OT Goals(Current goals can be found in the care plan section) Acute Rehab OT Goals Patient Stated Goal: Return to [facility] OT Goal Formulation: With patient Time For Goal Achievement: 09/26/14 Potential to Achieve Goals: Good ADL Goals Pt Will Perform Lower Body Bathing: with min guard assist;sit to/from stand Pt Will Perform Lower Body Dressing: sit to/from stand;with min guard assist Pt Will Transfer to Toilet: with min guard assist;stand pivot transfer;bedside commode Pt Will Perform Toileting - Clothing Manipulation and hygiene: with min guard assist;sit to/from stand  OT Frequency: Min 1X/week              End of Session Equipment Utilized During Treatment: Gait belt;Rolling walker;Oxygen Nurse Communication: Other (comment) (pt urinated in [facility])  Activity Tolerance: Patient tolerated treatment well Patient left: in bed;with call bell/phone within reach;with family/visitor present   Time: 2355-7322 OT Time Calculation (min): 33 min Charges:  OT General Charges $OT Visit: 1 Procedure OT Evaluation $Initial OT  Evaluation Tier I: 1 Procedure OT Treatments $Self Care/Home Management : 23-37 mins  Juluis Rainier 09/12/2014, 4:12 PM  Cyndie Chime, OTR/L Occupational Therapist [phone] (pager)

## 2014-09-12 NOTE — Progress Notes (Signed)
Patient is presently admitted to the Hospital.  Family wanted to have a meeting regarding the patient.  I have reviewed the diagnosis and stage (Stage IVB) with the family.  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  We discussed the role of systemic chemotherapy is purely palliative.  Depending on her course, debulking surgery may be a treatment option, but that will have to be determined in the future.  We discussed her treatment plan which consists of Carboplatin/Paclitaxel given in a day 1, 8, 15 every 28 day fashion to aid with tolerability.  I have informed the family that her malignancy is incurable.  Hospice/Comfort care in lieu of treatment would be reasonable if so decided.  "Mother says she wants to give it a try."  I reviewed side effects of treatment, along with risks and benefits.  I discussed alternative options to treatment as well.  Goal of treatment is QOL.  So we left the conversation with the following game plan: 1. Upon discharge from the hospital, family will contact the [facility].  We will get her an appointment for follow-up about 7 days out from discharge.  If patient is willing to be treated and clinically she meets treatment parameters, we will plan to treat 1-4 days later. 2. My contact information is provided for questions. 3. One family spokesperson will be appointed to inform the rest of the family on data. 4. Hospice is an option at anytime.   No charge today as patient was not present.  45 minutes spent in family consultation.  Gerlad Pelzel 09/12/2014

## 2014-09-12 NOTE — Clinical Social Work Note (Signed)
Sent PT evaluation to [facility] so that facility could submit for auth. CSW contacted acute rehab to request that OT complete evaluation to support a SNF auth. CSW will continue to follow.   Liz Beach MSW, [employer], [place], [phone]

## 2014-09-12 NOTE — Progress Notes (Signed)
[facility]                                                                              Patient Demographics  [name], is a 79 y.o. female, DOB - 12/27/1934, IWP:[id number]  Admit date - 09/08/2014   Admitting Physician Truett Mainland, DO  Outpatient Primary MD for the patient is HAWKINS,EDWARD Carlean Jews, MD  LOS - 4   Chief Complaint  Patient presents with  . Chest Pain      HPI on 09/08/2014 [name] is a 79 y.o. female with a history of diastolic CHF, ovarian cancer with a malignant pleural effusions, status post left Pleurx catheter. The patient was recently seen by Dr. Prescott Gum of CT surgery who removed a pleural effusion on the right side. The patient began having worsening shortness of breath earlier today and was accompanied by nonradiating sharp right-sided chest pain that started earlier today. Chest pain was mild upon admission, but she continued to complain of labored breathing that which worse with exertion. The shortness of breath improved with rest. She has had a Port-A-Cath placed in preparation for chemotherapy, which is to start on Monday. She has been having increased O2 requirements and has been on nasal cannula the majority of the day.   Assessment & Plan   Recurrent Malignant Bilateral Pleural Effusions  -S/p left pleurex catheter.  -Had right thoracentesis 10/7 yielding 2 L and again on 10/9 yielding 1.6 L.  -Previous hospitalist discussed with Dr. Prescott Gum, patient needs a right pleurex as well- possibly on Monday 10/12 -Patient was transferred to [facility] surgery today, aware of patient, however Dr. Prescott Gum is on vacation -Will consult IR for placement of pleurex   Ovarian Cancer  -Scheduled to start chemo next week.  -Previous hospitalist discussed with cancer center at [place]; chemotherapy will be deferred until family meeting on  10/12 at 11:30 am  Acute Respiratory Failure  -Secondary to malignant pleural effusion.  -See above for  details.  -Improved secondary to thoracentesis  Chronic diastolic dysfunction -Noted on Echocardiogram 07/28/2014 -Currently euvolemic -Do not believe that the pleural effusions are related to CHF, but rather ovarian cancer.   Chronic Anemia -Hemoglobin appears to be stable continue to monitor CBC.  Code Status: DNR  Family Communication: Sister at bedside  Disposition Plan: Admitted, pending placement of right pleurex   Time Spent in minutes   30 minutes  Procedures  [place] Right Thoracentesis  Consults   Interventional Radiology Cardiothoracic Surgery, Dr. Lucianne Lei Tright  DVT Prophylaxis  SCDs  Lab Results  Component Value Date   PLT 277 09/12/2014    Medications  Scheduled Meds: . antiseptic oral rinse  7 mL Mouth Rinse BID  . docusate sodium  100 mg Oral BID  . furosemide  20 mg Intravenous BID  . HYDROcodone-acetaminophen  1 tablet Oral BID  . metoCLOPramide  5 mg Oral QID  . pantoprazole  40 mg Oral Daily  . sodium chloride  3 mL Intravenous Q12H   Continuous Infusions:  PRN Meds:.acetaminophen, acetaminophen, alum & mag hydroxide-simeth, docusate sodium, HYDROcodone-acetaminophen, morphine injection, ondansetron (ZOFRAN) IV, sodium chloride  Antibiotics  Anti-infectives   None      Subjective:   Jeraldean Wechter seen and examined today.  Patient states that her breathing has improved and she no longer feels short of breath.  She denies any chest pain, abdominal pain, headache, or dizziness.  Objective:   Filed Vitals:   09/11/14 0524 09/11/14 1448 09/11/14 2117 09/12/14 0609  BP: 100/63 92/61 94/57  93/60  Pulse: 90 90 97 94  Temp: 98.1 F (36.7 C) 97.8 F (36.6 C) 97.6 F (36.4 C) 98.1 F (36.7 C)  TempSrc: Oral Oral Oral Oral  Resp: 18 18 18 18   Height:      Weight:      SpO2: 96% 98% 100% 96%    Wt Readings from Last 3 Encounters:  09/09/14 59.6 kg (131 lb 6.3 oz)  09/07/14 64.864 kg (143 lb)  08/23/14 65.273 kg (143 lb 14.4 oz)      Intake/Output Summary (Last 24 hours) at 09/12/14 1110 Last data filed at 09/12/14 1043  Gross per 24 hour  Intake      0 ml  Output    300 ml  Net   -300 ml    Exam  General: Well developed, well nourished, NAD, appears stated age  HEENT: NCAT, mucous membranes moist.   Cardiovascular: S1 S2 auscultated, RRR  Respiratory: Diminished but clear breath sounds.  No wheezing noted  Abdomen: Soft, nontender, nondistended, + bowel sounds  Extremities: warm dry without cyanosis clubbing or edema  Neuro: AAOx3, no focal deficits  Psych: Normal affect and demeanor  Data Review   Micro Results Recent Results (from the past 240 hour(s))  MRSA PCR SCREENING     Status: None   Collection Time    09/09/14  1:00 AM      Result Value Ref Range Status   MRSA by PCR NEGATIVE  NEGATIVE Final   Comment:            The GeneXpert MRSA Assay (FDA     approved for NASAL specimens     only), is one component of a     comprehensive MRSA colonization     surveillance program. It is not     intended to diagnose MRSA     infection nor to guide or     monitor treatment for     MRSA infections.    Radiology Reports Dg Chest 1 View  09/09/2014   CLINICAL DATA:  RIGHT pleural effusion post thoracentesis  EXAM: CHEST - 1 VIEW  COMPARISON:  Earlier exam of 09/08/2014  FINDINGS: RIGHT jugular Port-A-Cath stable tip projecting over RIGHT atrium.  Enlargement of cardiac silhouette with pulmonary vascular congestion.  Bibasilar effusions and atelectasis greater on RIGHT.  RIGHT pleural effusion has decreased since the earlier study of 09/08/2014.  No pneumothorax.  Several skin folds project over lower RIGHT chest, confirmed on repeat image.  Atherosclerotic calcification at aortic arch.  Diffuse osseous demineralization.  IMPRESSION: No pneumothorax following RIGHT thoracentesis.  Bibasilar effusions and atelectasis greater on RIGHT, with improved aeration at RIGHT base versus earlier study    Electronically Signed   By: Lavonia Dana M.D.   On: 09/09/2014 13:38   Dg Chest 2 View  09/07/2014   CLINICAL DATA:  Malignant pleural effusion.  EXAM: CHEST  2 VIEW  COMPARISON:  August 19, 2014.  FINDINGS: Interval placement of right internal jugular Port-A-Cath with distal tip in expected position of the SVC. Moderate left pleural effusion is again noted and stable. Right pleural  effusion is significantly enlarged compared to prior exam. No pneumothorax is noted.  IMPRESSION: Large right pleural effusion is noted which is significantly increased in size compared to prior exam. Stable mild left pleural effusion.   Electronically Signed   By: Sabino Dick M.D.   On: 09/07/2014 10:43   Dg Chest 2 View  08/19/2014   CLINICAL DATA:  Shortness of Breath; pleural effusions  EXAM: CHEST  2 VIEW  COMPARISON:  August 12, 2014  FINDINGS: Pleural effusions, moderate, are again noted. There is bibasilar atelectatic change. There is atelectatic change in the left mid lung. There has been interval resolution of consolidation from the left mid lung. There is no new opacity compared to recent prior study.  Heart size and pulmonary vascularity are normal. No adenopathy. No bone lesions.  IMPRESSION: Persistent effusions without appreciable change from recent prior study. Interval resolution of left mid lung consolidation. Patchy atelectasis remains in this area. There is also atelectatic change in both lung bases. There is no change in cardiac silhouette.   Electronically Signed   By: Lowella Grip M.D.   On: 08/19/2014 17:00   Dg Esophagus  08/16/2014   CLINICAL DATA:  Dysphagia.  EXAM: ESOPHOGRAM / BARIUM SWALLOW / BARIUM TABLET STUDY  TECHNIQUE: Combined double contrast and single contrast examination performed using effervescent crystals, thick barium liquid, and thin barium liquid. The patient was observed with fluoroscopy swallowing a 13mm barium sulphate tablet.  FLUOROSCOPY TIME:  2 min 48 seconds.   COMPARISON:  None.  FINDINGS: No mass or stricture is noted in the esophagus. Moderate sliding-type hiatal hernia is noted. Barium tablet passed without difficulty or delay through esophagus into stomach. No definite reflux is noted.  IMPRESSION: Moderate size sliding-type hiatal hernia is noted. No mass or stricture is noted in the esophagus.   Electronically Signed   By: Sabino Dick M.D.   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 She has been referred for port catheter placement  EXAM: IR RIGHT FLOURO GUIDE CV LINE; IR ULTRASOUND GUIDANCE VASC ACCESS RIGHT  Date: 08/30/2014  ANESTHESIA/SEDATION: Moderate (conscious) sedation was administered during this procedure. A total of 1.0 mg Versed and 50 mg Fentanyl were administered intravenously. The patient's vital signs were monitored continuously by radiology nursing throughout the course of the procedure.  Total sedation time: 30 minutes  FLUOROSCOPY TIME:  12 seconds  TECHNIQUE: The risks and benefits of the procedure were discussed with the patient with full informed consent and the patient wishes to proceed.  The right neck and chest was prepped with chlorhexidine, and draped in the usual sterile fashion using maximum barrier technique (cap and mask, sterile gown, sterile gloves, large sterile sheet, hand hygiene and cutaneous antiseptic). Antibiotic prophylaxis was provided with 1.0 g vancomycin administered IV one hour prior to skin incision. Local anesthesia was attained by infiltration with 1% lidocaine without epinephrine.  Ultrasound demonstrated patency of the right internal jugular vein, and this was documented with an image. Under real-time ultrasound guidance, this vein was accessed with a 21 gauge micropuncture needle and image documentation was performed. A small dermatotomy was made at the access site with an 11 scalpel. A 0.018" wire was advanced into the  SVC and the access needle exchanged for a 34F micropuncture vascular sheath. The 0.018" wire was then removed and a 0.035" wire advanced into the IVC.  An appropriate location for the  subcutaneous reservoir was selected below the clavicle and an incision was made through the skin and underlying soft tissues. The subcutaneous tissues were then dissected using a combination of blunt and sharp surgical technique and a pocket was formed. A single lumen power injectable portacatheter was then tunneled through the subcutaneous tissues from the pocket to the dermatotomy and the port reservoir placed within the subcutaneous pocket.  The venous access site was then serially dilated and a peel away vascular sheath placed over the wire. The wire was removed and the port catheter advanced into position under fluoroscopic guidance. The catheter tip is positioned in the cavoatrial junction. This was documented with a spot image. The portacatheter was then tested and found to flush and aspirate well. The port was flushed with saline followed by 100 units/mL heparinized saline.  The pocket was then closed in two layers using first subdermal inverted interrupted absorbable sutures followed by a running subcuticular suture. The epidermis was then sealed with Dermabond. The dermatotomy at the venous access site was also sealed with Dermabond.  COMPLICATIONS: None.  The patient tolerated the procedure well.  IMPRESSION: Status post placement of right IJ port catheter with the tip at the cavoatrial junction. The catheter is ready for use.  Signed,  Dulcy Fanny. Earleen Newport, DO  [facility]  [facility]   Electronically Signed   By: Corrie Mckusick O.D.   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 She has been referred for port catheter placement  EXAM: IR RIGHT FLOURO GUIDE CV LINE; IR ULTRASOUND GUIDANCE VASC  ACCESS RIGHT  Date: 08/30/2014  ANESTHESIA/SEDATION: Moderate (conscious) sedation was administered during this procedure. A total of 1.0 mg Versed and 50 mg Fentanyl were administered intravenously. The patient's vital signs were monitored continuously by radiology nursing throughout the course of the procedure.  Total sedation time: 30 minutes  FLUOROSCOPY TIME:  12 seconds  TECHNIQUE: The risks and benefits of the procedure were discussed with the patient with full informed consent and the patient wishes to proceed.  The right neck and chest was prepped with chlorhexidine, and draped in the usual sterile fashion using maximum barrier technique (cap and mask, sterile gown, sterile gloves, large sterile sheet, hand hygiene and cutaneous antiseptic). Antibiotic prophylaxis was provided with 1.0 g vancomycin administered IV one hour prior to skin incision. Local anesthesia was attained by infiltration with 1% lidocaine without epinephrine.  Ultrasound demonstrated patency of the right internal jugular vein, and this was documented with an image. Under real-time ultrasound guidance, this vein was accessed with a 21 gauge micropuncture needle and image documentation was performed. A small dermatotomy was made at the access site with an 11 scalpel. A 0.018" wire was advanced into the SVC and the access needle exchanged for a 55F micropuncture vascular sheath. The 0.018" wire was then removed and a 0.035" wire advanced into the IVC.  An appropriate location for the subcutaneous reservoir was selected below the clavicle and an incision was made through the skin and underlying soft tissues. The subcutaneous tissues were then dissected using a combination of blunt and sharp surgical technique and a pocket was formed. A single lumen power injectable portacatheter was then tunneled through the subcutaneous tissues from the pocket to the dermatotomy and the port reservoir placed within the subcutaneous pocket.  The venous access  site was then serially dilated and a peel away vascular  sheath placed over the wire. The wire was removed and the port catheter advanced into position under fluoroscopic guidance. The catheter tip is positioned in the cavoatrial junction. This was documented with a spot image. The portacatheter was then tested and found to flush and aspirate well. The port was flushed with saline followed by 100 units/mL heparinized saline.  The pocket was then closed in two layers using first subdermal inverted interrupted absorbable sutures followed by a running subcuticular suture. The epidermis was then sealed with Dermabond. The dermatotomy at the venous access site was also sealed with Dermabond.  COMPLICATIONS: None.  The patient tolerated the procedure well.  IMPRESSION: Status post placement of right IJ port catheter with the tip at the cavoatrial junction. The catheter is ready for use.  Signed,  Dulcy Fanny. Earleen Newport, DO  [facility]  [facility]   Electronically Signed   By: Corrie Mckusick O.D.   On: 08/30/2014 14:39   Dg Chest 2v Repeat Same Day  09/07/2014   CLINICAL DATA:  Malignant right pleural effusion, right thoracentesis  EXAM: CHEST - 2 VIEW SAME DAY  COMPARISON:  Chest x-ray of 09/07/2014  FINDINGS: There are small bilateral pleural effusions present with significant reduction in the volume of the right pleural effusion after right thoracentesis. No pneumothorax is seen. Heart size is stable. The bones are somewhat osteopenic.  IMPRESSION: Significant decrease in volume of the right pleural effusion after right thoracentesis. No pneumothorax.   Electronically Signed   By: Ivar Drape M.D.   On: 09/07/2014 13:17   Dg Chest Port 1 View  09/08/2014   CLINICAL DATA:  Intermittent central chest pain and shortness of breath. Right-sided thoracentesis yesterday. Initial presentation.  EXAM: PORTABLE CHEST - 1 VIEW  COMPARISON:  Two-view chest x-ray 09/07/2014.  FINDINGS:  The heart borders are obscured. The right pleural effusion has re-accumulated, opacifying 2/3 the right hemi thorax. A small moderate left pleural effusion is present as well. Mild diffuse edema is present.  IMPRESSION: 1. Re-accumulation of right pleural effusion similar or increased to the level prior to the thoracentesis. 2. Small left pleural effusion is stable. 3. Increased edema.   Electronically Signed   By: Lawrence Santiago M.D.   On: 09/08/2014 20:08   Dg Chest Portable 1 View  08/12/2014   CLINICAL DATA:  Chest pain.  EXAM: PORTABLE CHEST - 1 VIEW  COMPARISON:  08/05/2014.  FINDINGS: The cardiac silhouette, mediastinal and hilar contours are stable. The lungs demonstrate persistent effusions and bibasilar atelectasis or infiltrates. No pneumothorax.  IMPRESSION: Persistent effusions and bilateral infiltrates or atelectasis.   Electronically Signed   By: Kalman Jewels M.D.   On: 08/12/2014 19:13   US Thoracentesis Asp Pleural Space W/img Guide  09/09/2014   CLINICAL DATA:  Recurrent RIGHT pleural effusion  EXAM: ULTRASOUND GUIDED THERAPEUTIC RIGHT THORACENTESIS  COMPARISON:  None.  PROCEDURE: Procedure, benefits, and risks of procedure were discussed with patient.  Written informed consent for procedure was obtained.  Time out protocol followed.  Pleural effusion localized by ultrasound at the posterior RIGHT hemithorax.  Skin prepped and draped in usual sterile fashion.  Skin and soft tissues anesthetized with 5 mL of 1% lidocaine.  8 French thoracentesis catheter placed into the RIGHT pleural space.  1600 mL of yellow fluid aspirated by syringe pump.  Procedure tolerated very well by patient without immediate complication.  FINDINGS: A total of approximately 1600 mL of yellow RIGHT pleural fluid was removed. A fluid  sample of 180 mL wassent for laboratory analysis.  IMPRESSION: Successful ultrasound guided RIGHT thoracentesis yielding 1600 mL of pleural fluid.   Electronically Signed   By: Lavonia Dana M.D.   On: 09/09/2014 13:48    CBC  Recent Labs Lab 09/08/14 1959 09/09/14 0621 09/11/14 0550 09/12/14 0520  WBC 9.8 7.6 5.8 6.4  HGB 11.7* 11.4* 10.2* 10.3*  HCT 36.0 35.7* 32.3* 32.0*  PLT 377 331 299 277  MCV 79.8 80.6 79.2 78.8  MCH 25.9* 25.7* 25.0* 25.4*  MCHC 32.5 31.9 31.6 32.2  RDW 16.9* 16.9* 17.0* 16.8*    Chemistries   Recent Labs Lab 09/08/14 1959 09/09/14 0621 09/11/14 0550 09/12/14 0520  NA 134* 135* 135* 134*  K 4.8 4.7 3.8 3.8  CL 94* 96 94* 94*  CO2 28 30 27 29   GLUCOSE 110* 102* 98 107*  BUN 33* 31* 26* 23  CREATININE 0.99 1.05 0.95 0.86  CALCIUM 8.4 8.2* 7.8* 7.8*   ------------------------------------------------------------------------------------------------------------------ estimated creatinine clearance is 49.3 ml/min (by C-G formula based on Cr of 0.86). ------------------------------------------------------------------------------------------------------------------ No results found for this basename: HGBA1C,  in the last 72 hours ------------------------------------------------------------------------------------------------------------------ No results found for this basename: CHOL, HDL, LDLCALC, TRIG, CHOLHDL, LDLDIRECT,  in the last 72 hours ------------------------------------------------------------------------------------------------------------------ No results found for this basename: TSH, T4TOTAL, FREET3, T3FREE, THYROIDAB,  in the last 72 hours ------------------------------------------------------------------------------------------------------------------ No results found for this basename: VITAMINB12, FOLATE, FERRITIN, TIBC, IRON, RETICCTPCT,  in the last 72 hours  Coagulation profile No results found for this basename: INR, PROTIME,  in the last 168 hours  No results found for this basename: DDIMER,  in the last 72 hours  Cardiac Enzymes  Recent Labs Lab 09/08/14 [place] <0.30    ------------------------------------------------------------------------------------------------------------------ No components found with this basename: POCBNP,     Aubriella Perezgarcia D.O. on 09/12/2014 at 11:10 AM  Between 7am to 7pm - Pager - [phone]  After 7pm go to [email or web address] - password [email or web address]  And look for the night coverage person covering for me after hours  [facility] Office  [phone]

## 2014-09-12 NOTE — Progress Notes (Signed)
OT Cancellation Note  Patient Details Name: [name] MRN: [id number] DOB: 09-08-1935   Cancelled Treatment:    Reason Eval/Treat Not Completed: OT screened, no needs identified, will sign off. Pt's current D/C plan is SNF (from SNF and plan to return). No apparent immediate acute care OT needs, therefore will defer OT to SNF. If OT eval is needed please call Acute Rehab Dept. at [phone] or text page OT at [phone].    Benito Mccreedy OTR/L [phone] 09/12/2014, 2:41 PM

## 2014-09-12 NOTE — Patient Instructions (Signed)
[facility] Discharge Instructions  RECOMMENDATIONS MADE BY THE CONSULTANT AND ANY TEST RESULTS WILL BE SENT TO YOUR REFERRING PHYSICIAN.  Please call the clinic [phone] when [name] is discharged from the hospital.  We will make her an appointment about 1 week after she is discharged to talk about further plans.  Thank you for choosing [facility] to provide your oncology and hematology care.  To afford each patient quality time with our providers, please arrive at least 15 minutes before your scheduled appointment time.  With your help, our goal is to use those 15 minutes to complete the necessary work-up to ensure our physicians have the information they need to help with your evaluation and healthcare recommendations.    Effective January 1st, 2014, we ask that you re-schedule your appointment with our physicians should you arrive 10 or more minutes late for your appointment.  We strive to give you quality time with our providers, and arriving late affects you and other patients whose appointments are after yours.    Again, thank you for choosing [facility].  Our hope is that these requests will decrease the amount of time that you wait before being seen by our physicians.       _____________________________________________________________  Should you have questions after your visit to [facility], please contact our office at [phone] between the hours of 8:30 a.m. and 5:00 p.m.  Voicemails left after 4:30 p.m. will not be returned until the following business day.  For prescription refill requests, have your pharmacy contact our office with your prescription refill request.

## 2014-09-12 NOTE — Evaluation (Signed)
Physical Therapy Evaluation Patient Details Name: [name] MRN: [id number] DOB: 06/29/1935 Today's Date: 09/12/2014   History of Present Illness  DALICIA KISNER is a 79 y.o. female with a history of diastolic CHF, ovarian cancer with a malignant pleural effusions, status post left Pleurx catheter admitted with increasing SOB.  Clinical Impression  Pt admitted with acute on chronic CHF. Pt currently with functional limitations due to the deficits listed below (see PT Problem List). Pt will benefit from skilled PT to increase their independence and safety with mobility to allow discharge to the venue listed below. Pt and sister would like pt to return to [facility].     Follow Up Recommendations SNF    Equipment Recommendations  None recommended by PT    Recommendations for Other Services       Precautions / Restrictions        Mobility  Bed Mobility Overal bed mobility: Needs Assistance Bed Mobility: Supine to Sit     Supine to sit: Min guard     General bed mobility comments: good rolling technique  Transfers Overall transfer level: Needs assistance Equipment used: Rolling walker (2 wheeled) Transfers: Sit to/from [facility] Sit to Stand: Min assist Stand pivot transfers: Min assist          Ambulation/Gait Ambulation/Gait assistance: Min assist Ambulation Distance (Feet): 5 Feet Assistive device: Rolling walker (2 wheeled) Gait Pattern/deviations: Decreased step length - right;Decreased step length - left;Trunk flexed     General Gait Details: Amb from [facility] to recliner  Stairs            Wheelchair Mobility    Modified Rankin (Stroke Patients Only)       Balance Overall balance assessment: Needs assistance Sitting-balance support: Feet supported Sitting balance-Leahy Scale: Fair     Standing balance support: Bilateral upper extremity supported Standing balance-Leahy Scale: Poor                                Pertinent Vitals/Pain Pain Assessment: Faces Faces Pain Scale: Hurts a little bit Pain Location: back Pain Intervention(s): Repositioned    Home Living Family/patient expects to be discharged to:: Skilled nursing facility [facility])                 Additional Comments: Pt was I until Aug 26th when 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      Prior Function Level of Independence: Needs assistance   Gait / Transfers Assistance Needed: Pt was receiving therapy at SNF until a few weeks ago when she became too weak to participate with PT.  Before that she was walking with PT without AD.           Hand Dominance        Extremity/Trunk Assessment   Upper Extremity Assessment: Generalized weakness           Lower Extremity Assessment: Generalized weakness      Cervical / Trunk Assessment: Kyphotic  Communication   Communication: No difficulties  Cognition Arousal/Alertness: Awake/alert Behavior During Therapy: WFL for tasks assessed/performed Overall Cognitive Status: History of cognitive impairments - at baseline       Memory: Decreased short-term memory              General Comments General comments (skin integrity, edema, etc.): sister present.    Exercises        Assessment/Plan  PT Assessment Patient needs continued PT services  PT Diagnosis Generalized weakness;Difficulty walking   PT Problem List Decreased activity tolerance;Decreased balance;Decreased mobility;Decreased strength  PT Treatment Interventions Gait training;Functional mobility training;Therapeutic activities;Therapeutic exercise   PT Goals (Current goals can be found in the Care Plan section) Acute Rehab PT Goals Patient Stated Goal: Return to [facility] PT Goal Formulation: With patient/family Time For Goal Achievement: 09/26/14 Potential to Achieve Goals: Good    Frequency Min 3X/week   Barriers to discharge        Co-evaluation                End of Session Equipment Utilized During Treatment: Gait belt Activity Tolerance: Patient tolerated treatment well Patient left: in chair;with family/visitor present;with call bell/phone within reach;with chair alarm set Nurse Communication: Mobility status         Time: 1240-1310 PT Time Calculation (min): 30 min   Charges:   PT Evaluation $Initial PT Evaluation Tier I: 1 Procedure PT Treatments $Therapeutic Activity: 8-22 mins   PT G Codes:          Ronnel Zuercher LUBECK 09/12/2014, 2:02 PM

## 2014-09-12 NOTE — Progress Notes (Signed)
CARE MANAGEMENT NOTE 09/12/2014  Patient:  [name], [name]   Account Number:  [id number]  Date Initiated:  09/12/2014  Documentation initiated by:  [facility]  Subjective/Objective Assessment:   Recurrent Malignant Bilateral Pleural Effusions     Action/Plan:   SNF   Anticipated DC Date:     Anticipated DC Plan:  [place] referral  Clinical Social Worker      DC Planning Services  CM consult      Choice offered to / List presented to:             Status of service:  In process, will continue to follow Medicare Important Message given?   (If response is "NO", the following Medicare IM given date fields will be blank) Date Medicare IM given:   Medicare IM given by:   Date Additional Medicare IM given:   Additional Medicare IM given by:    Discharge Disposition:    Per UR Regulation:    If discussed at Long Length of Stay Meetings, dates discussed:    Comments:  09/12/2014 1000 NCM spoke to pt and gave permission to speak to son, [name], [place] # [phone]. Pt's sister, [name] was in the room. Waiting for final recommendation for dc. CSW for SNF placement. Jonnie Finner RN CCM Case Mgmt phone [phone]

## 2014-09-13 ENCOUNTER — Encounter (HOSPITAL_COMMUNITY): Payer: Self-pay | Admitting: Anesthesiology

## 2014-09-13 ENCOUNTER — Inpatient Hospital Stay (HOSPITAL_COMMUNITY): Payer: Medicare HMO

## 2014-09-13 ENCOUNTER — Encounter (HOSPITAL_COMMUNITY)
Admission: EM | Disposition: A | Discharge status: Skilled Nursing Facility | Payer: Self-pay | Source: Home / Self Care | Attending: Internal Medicine

## 2014-09-13 ENCOUNTER — Inpatient Hospital Stay (HOSPITAL_COMMUNITY): Payer: Medicare HMO | Admitting: Anesthesiology

## 2014-09-13 ENCOUNTER — Encounter (HOSPITAL_COMMUNITY): Payer: Medicare HMO | Admitting: Anesthesiology

## 2014-09-13 DIAGNOSIS — I5033 Acute on chronic diastolic (congestive) heart failure: Secondary | ICD-10-CM

## 2014-09-13 DIAGNOSIS — J9621 Acute and chronic respiratory failure with hypoxia: Secondary | ICD-10-CM

## 2014-09-13 HISTORY — PX: CHEST TUBE INSERTION: SHX231

## 2014-09-13 LAB — BASIC METABOLIC PANEL
Anion gap: 14 (ref 5–15)
BUN: 23 mg/dL (ref 6–23)
CALCIUM: 7.9 mg/dL — AB (ref 8.4–10.5)
CHLORIDE: 93 meq/L — AB (ref 96–112)
CO2: 28 mEq/L (ref 19–32)
Creatinine, Ser: 0.96 mg/dL (ref 0.50–1.10)
GFR calc Af Amer: 64 mL/min — ABNORMAL LOW (ref >90)
GFR calc non Af Amer: 56 mL/min — ABNORMAL LOW (ref >90)
GLUCOSE: 94 mg/dL (ref 70–99)
Potassium: 3.5 mEq/L — ABNORMAL LOW (ref 3.7–5.3)
SODIUM: 135 meq/L — AB (ref 137–147)

## 2014-09-13 SURGERY — INSERTION, PLEURAL DRAINAGE CATHETER
Anesthesia: Monitor Anesthesia Care | Site: Chest | Laterality: Right

## 2014-09-13 MED ORDER — EPHEDRINE SULFATE 50 MG/ML IJ SOLN
INTRAMUSCULAR | Status: AC
  Filled 2014-09-13: qty 1

## 2014-09-13 MED ORDER — LACTATED RINGERS IV SOLN: INTRAVENOUS | Status: DC

## 2014-09-13 MED ORDER — FENTANYL CITRATE 0.05 MG/ML IJ SOLN
25.0000 ug | INTRAMUSCULAR | Status: DC | PRN
Start: 2014-09-13 — End: 2014-09-13
  Administered 2014-09-13: 50 ug via INTRAVENOUS

## 2014-09-13 MED ORDER — FENTANYL CITRATE 0.05 MG/ML IJ SOLN
INTRAMUSCULAR | Status: DC | PRN
  Administered 2014-09-13 (×2): 25 ug via INTRAVENOUS

## 2014-09-13 MED ORDER — FENTANYL CITRATE 0.05 MG/ML IJ SOLN
INTRAMUSCULAR | Status: AC
  Filled 2014-09-13: qty 2

## 2014-09-13 MED ORDER — SODIUM CHLORIDE 0.9 % IV SOLN
INTRAVENOUS | Status: DC | PRN
  Administered 2014-09-13: 13:00:00 via INTRAVENOUS

## 2014-09-13 MED ORDER — ONDANSETRON HCL 4 MG/2ML IJ SOLN: 4.0000 mg | Freq: Once | INTRAMUSCULAR | Status: AC | PRN

## 2014-09-13 MED ORDER — ONDANSETRON HCL 4 MG/2ML IJ SOLN
INTRAMUSCULAR | Status: AC
  Filled 2014-09-13: qty 2

## 2014-09-13 MED ORDER — ONDANSETRON HCL 4 MG/2ML IJ SOLN
INTRAMUSCULAR | Status: DC | PRN
  Administered 2014-09-13: 4 mg via INTRAVENOUS

## 2014-09-13 MED ORDER — PROPOFOL 10 MG/ML IV BOLUS
INTRAVENOUS | Status: AC
  Filled 2014-09-13: qty 20

## 2014-09-13 MED ORDER — SODIUM CHLORIDE 0.9 % IJ SOLN
INTRAMUSCULAR | Status: AC
  Filled 2014-09-13: qty 10

## 2014-09-13 MED ORDER — 0.9 % SODIUM CHLORIDE (POUR BTL) OPTIME
TOPICAL | Status: DC | PRN
  Administered 2014-09-13: 1000 mL

## 2014-09-13 MED ORDER — FENTANYL CITRATE 0.05 MG/ML IJ SOLN
INTRAMUSCULAR | Status: AC
  Filled 2014-09-13: qty 5

## 2014-09-13 MED ORDER — HYDROMORPHONE HCL 1 MG/ML IJ SOLN: 0.2500 mg | INTRAMUSCULAR | Status: DC | PRN

## 2014-09-13 MED ORDER — HYDROCODONE-ACETAMINOPHEN 5-325 MG PO TABS
ORAL_TABLET | ORAL | Status: AC
  Filled 2014-09-13: qty 1

## 2014-09-13 MED ORDER — LIDOCAINE HCL (CARDIAC) 20 MG/ML IV SOLN
INTRAVENOUS | Status: AC
  Filled 2014-09-13: qty 5

## 2014-09-13 MED ORDER — PHENYLEPHRINE HCL 10 MG/ML IJ SOLN
INTRAMUSCULAR | Status: DC | PRN
Start: 2014-09-13 — End: 2014-09-13
  Administered 2014-09-13: 80 ug via INTRAVENOUS

## 2014-09-13 MED ORDER — SODIUM CHLORIDE 0.9 % IV SOLN
INTRAVENOUS | Status: DC
  Administered 2014-09-13: via INTRAVENOUS

## 2014-09-13 MED ORDER — LIDOCAINE HCL 1 % IJ SOLN
INTRAMUSCULAR | Status: DC | PRN
  Administered 2014-09-13: 5 mL

## 2014-09-13 MED ORDER — PROPOFOL 10 MG/ML IV BOLUS
INTRAVENOUS | Status: DC | PRN
  Administered 2014-09-13 (×2): 20 mg via INTRAVENOUS

## 2014-09-13 SURGICAL SUPPLY — 28 items
ADH SKN CLS APL DERMABOND .7 (GAUZE/BANDAGES/DRESSINGS) ×1
BRUSH SCRUB EZ PLAIN DRY (MISCELLANEOUS) ×6 IMPLANT
CANISTER SUCTION 2500CC (MISCELLANEOUS) ×3 IMPLANT
COVER MAYO STAND STRL (DRAPES) ×3 IMPLANT
COVER SURGICAL LIGHT HANDLE (MISCELLANEOUS) ×3 IMPLANT
DERMABOND ADVANCED (GAUZE/BANDAGES/DRESSINGS) ×2
DERMABOND ADVANCED .7 DNX12 (GAUZE/BANDAGES/DRESSINGS) ×1 IMPLANT
DRAPE C-ARM 42X72 X-RAY (DRAPES) ×3 IMPLANT
DRAPE LAPAROSCOPIC ABDOMINAL (DRAPES) ×3 IMPLANT
GLOVE EUDERMIC 7 POWDERFREE (GLOVE) ×6 IMPLANT
GOWN STRL REUS W/ TWL LRG LVL3 (GOWN DISPOSABLE) ×2 IMPLANT
GOWN STRL REUS W/ TWL XL LVL3 (GOWN DISPOSABLE) ×1 IMPLANT
GOWN STRL REUS W/TWL LRG LVL3 (GOWN DISPOSABLE) ×6
GOWN STRL REUS W/TWL XL LVL3 (GOWN DISPOSABLE) ×2
KIT BASIN OR (CUSTOM PROCEDURE TRAY) ×3 IMPLANT
KIT PLEURX DRAIN CATH 1000ML (MISCELLANEOUS) ×9 IMPLANT
KIT PLEURX DRAIN CATH 15.5FR (DRAIN) ×3 IMPLANT
KIT ROOM TURNOVER OR (KITS) ×3 IMPLANT
NS IRRIG 1000ML POUR BTL (IV SOLUTION) ×3 IMPLANT
PACK GENERAL/GYN (CUSTOM PROCEDURE TRAY) ×3 IMPLANT
PAD ARMBOARD 7.5X6 YLW CONV (MISCELLANEOUS) ×6 IMPLANT
SET DRAINAGE LINE (MISCELLANEOUS) IMPLANT
SPONGE GAUZE 4X4 12PLY STER LF (GAUZE/BANDAGES/DRESSINGS) ×3 IMPLANT
SUT ETHILON 3 0 PS 1 (SUTURE) ×3 IMPLANT
SUT VIC AB 3-0 X1 27 (SUTURE) ×3 IMPLANT
TOWEL OR 17X24 6PK STRL BLUE (TOWEL DISPOSABLE) ×3 IMPLANT
TOWEL OR 17X26 10 PK STRL BLUE (TOWEL DISPOSABLE) ×3 IMPLANT
WATER STERILE IRR 1000ML POUR (IV SOLUTION) ×3 IMPLANT

## 2014-09-13 NOTE — Op Note (Signed)
CARDIOTHORACIC SURGERY OPERATIVE NOTE   09/13/2014 [name] [id number]  Surgeon: Gaye Pollack, MD   First Assistant: none   Preoperative Diagnosis: Ovarian cancer with malignant pleural effusions  Postoperative Diagnosis: same   Procedure:   1.Insertion of right PleurX catheter   Anesthesia: MAC with local   Clinical History/Surgical Indication:   She has a history of metastatic ovarian cancer with a malignant left pleural effusion and underwent insertion of a left PleurX catheter by Dr. Darcey Nora on 08/04/2014. She has had minimal drainage since its insertion and was seen by him in the office last Wednesday 09/07/2014. A CXR at that time showed a large right pleural effusion that had progressed over the prior month. He performed a thoracentesis in the office and removed 2L of fluid. CXR afterward showed a significant decrease in the effusion. She did well at [facility] until Thursday when she developed right sided chest pain and shortness of breath. She was admitted to [facility] on early Friday morning after a CXR in the ER showed reaccumulation of the effusion. She had an US guided thoracentesis Friday removing 1.6L of fluid. A CXR afterward looked a little better but there was still sigificant effusion and atelectasis on the right. She was transferred to [facility] this weekend for further care. She says she is ok at rest in bed but gets short of breath with any activity like getting up to the bedside commode. I think insertion of a right Pleurx catheter is indicated to allow complete drainage and reexpansion of the lung for symptom relief. Hopefully these effusions will resolve after chemotherapy and the catheters can be removed. I discussed the option of inserting a right pleurX catheter with the patient and her sister, alternatives of recurrent thoracentesis and no treatment, and risks of the procedure including infection, pneumothorax, catheter malfunction and incomplete drainage. She understands  and agrees to proceed.    Preparation:  The patient was seen in the preoperative holding area and the correct patient, correct operation, correct operative sidewere confirmed with the patient after reviewing the medical record and CT scan. The right side of the chest was signed by me. The consent was signed by me. Preoperative antibiotics were given. The patient was taken back to the operating room and positioned supine on the operating room table. After intravenous sedation by anesthesia the chest was prepped with betadine soap and solution and draped in the usual sterile manner. A surgical time-out was taken and the correct patient,operative side, and operative procedure were confirmed with the nursing and anesthesia staff.   Operative Procedure:   The chest wall entry site was located on the right lateral chest approximately in the 8th ICS. 1% lidocaine was infiltrated in the skin and subcutaneous tissue down to the pleural space. Serous fluid was encountered. A small incision was made and the right pleural space was entered with a needle catheter. The needle was removed and the guidewire inserted into the pleural space. Its position was checked with floroscopy. The skin exit site was chosen on the anterior chest wall just above the costal margin and lidocaine was infiltrated here and along a subcutaneous tunnel over to the chest wall entry site. A small incision was made at the skin exit site and the Pleurx catheter was tunneled from the exit site over to the chest wall entry site and positioned with the cuff just inside the exit site. An introducer and sheath were inserted into the pleural space over the guide wire and the  introducer and wire removed. The catheter was inserted into the pleural space and the sheath removed. The catheter was connected to vacuum bottle suction and 3 liters of serous pleural fluid was removed. A sample was sent for cytology. After 3L of drainage there was still some fluid  remaining but I did not want to drain more due to the risk of post-expansion pulmonary edema. The catheter was fixed to the skin at the exit site with a nylon suture and the other incision was closed with a 3-0 vicryl subcuticular suture and Dermabond. The catheter was capped and a dressing applied over the exit site. The patient tolerated the procedure well with mild coughing. The sponge, needle and instrument counts were correct according to the nurses and the patient was transported to the PACU in stable and satisfactory condition.

## 2014-09-13 NOTE — Anesthesia Preprocedure Evaluation (Signed)
Anesthesia Evaluation  Patient identified by MRN, date of birth, ID band Patient awake    Reviewed: Allergy & Precautions, H&P , NPO status , Patient's Chart, lab work & pertinent test results  Airway Mallampati: II      Dental  (+) Teeth Intact   Pulmonary          Cardiovascular +CHF     Neuro/Psych    GI/Hepatic   Endo/Other    Renal/GU      Musculoskeletal  (+) Arthritis -,   Abdominal   Peds  Hematology   Anesthesia Other Findings   Reproductive/Obstetrics                           Anesthesia Physical Anesthesia Plan  ASA: III  Anesthesia Plan: MAC   Post-op Pain Management:    Induction: Intravenous  Airway Management Planned: Mask  Additional Equipment:   Intra-op Plan:   Post-operative Plan:   Informed Consent: I have reviewed the patients History and Physical, chart, labs and discussed the procedure including the risks, benefits and alternatives for the proposed anesthesia with the patient or authorized representative who has indicated his/her understanding and acceptance.   Dental advisory given  Plan Discussed with: CRNA and Anesthesiologist  Anesthesia Plan Comments:         Anesthesia Quick Evaluation

## 2014-09-13 NOTE — Clinical Social Work Note (Signed)
[facility] has received insurance authorization from insurance company for patient to return at discharge. CSW met with patient and friend at bedside to update on situation. Patient and friend are prepared for DC as early as tomorrow, if MD deems patient medically stable for return to [facility]. Patient plans to go to facility by ambulance. CSW will check in with patient and Clair Gulling (friend) tomorrow.    Liz Beach MSW, [place], [place], [phone]

## 2014-09-13 NOTE — Anesthesia Procedure Notes (Signed)
Procedure Name: MAC Date/Time: 09/13/2014 1:19 PM Performed by: Kyung Rudd Pre-anesthesia Checklist: Patient identified, Emergency Drugs available, Suction available, Patient being monitored and Timeout performed Patient Re-evaluated:Patient Re-evaluated prior to inductionOxygen Delivery Method: Simple face mask Preoxygenation: Pre-oxygenation with 100% oxygen Intubation Type: IV induction Placement Confirmation: positive ETCO2

## 2014-09-13 NOTE — Progress Notes (Signed)
[facility]                                                                              Patient Demographics  [name], is a 79 y.o. female, DOB - 02-Dec-1935, MWU:[id number]  Admit date - 09/08/2014   Admitting Physician Truett Mainland, DO  Outpatient Primary MD for the patient is HAWKINS,EDWARD Carlean Jews, MD  LOS - 5   Chief Complaint  Patient presents with  . Chest Pain      HPI on 09/08/2014 [name] is a 79 y.o. female with a history of diastolic CHF, ovarian cancer with a malignant pleural effusions, status post left Pleurx catheter. The patient was recently seen by Dr. Prescott Gum of CT surgery who removed a pleural effusion on the right side. The patient began having worsening shortness of breath earlier today and was accompanied by nonradiating sharp right-sided chest pain that started earlier today. Chest pain was mild upon admission, but she continued to complain of labored breathing that which worse with exertion. The shortness of breath improved with rest. She has had a Port-A-Cath placed in preparation for chemotherapy, which is to start on Monday. She has been having increased O2 requirements and has been on nasal cannula the majority of the day.   Assessment & Plan   Recurrent Malignant Bilateral Pleural Effusions  -S/p left pleurex catheter.  -Had right thoracentesis 10/7 yielding 2 L and again on 10/9 yielding 1.6 L.  -Previous hospitalist discussed with Dr. Prescott Gum, patient needs a right pleurex as well- possibly on Monday 10/12 -Patient was transferred to [facility] surgery today, aware of patient patient will be seen by Dr. Cyndia Bent -Placement of right pleurex today  Ovarian Cancer  -Scheduled to start chemo next week.  -Previous hospitalist discussed with cancer center at [facility]; family meeting on  10/12 -Family and patient will consider chemotherapy pending her discharge.  Acute Respiratory Failure  -Secondary to malignant pleural effusion.  -See  above for details.  -Improved secondary to thoracentesis  Chronic diastolic dysfunction -Noted on Echocardiogram 07/28/2014 -Currently euvolemic -Do not believe that the pleural effusions are related to CHF, but rather ovarian cancer.   Chronic Anemia -Hemoglobin appears to be stable continue to monitor CBC.  Code Status: DNR  Family Communication: None at bedside  Disposition Plan: Admitted, pending placement of right pleurex   Time Spent in minutes   25 minutes  Procedures  [place] Right Thoracentesis  Consults   Interventional Radiology Cardiothoracic Surgery, Dr. Lucianne Lei Tright  DVT Prophylaxis  SCDs  Lab Results  Component Value Date   PLT 306 09/12/2014    Medications  Scheduled Meds: . antiseptic oral rinse  7 mL Mouth Rinse BID  . docusate sodium  100 mg Oral BID  . furosemide  20 mg Intravenous BID  . HYDROcodone-acetaminophen  1 tablet Oral BID  . levofloxacin (LEVAQUIN) IV  750 mg Intravenous On Call to OR  . metoCLOPramide  5 mg Oral QID  . pantoprazole  40 mg Oral Daily  . polyethylene glycol  17 g Oral Daily  . sodium chloride  3 mL Intravenous Q12H   Continuous Infusions:  PRN Meds:.acetaminophen,  acetaminophen, alum & mag hydroxide-simeth, docusate sodium, HYDROcodone-acetaminophen, morphine injection, ondansetron (ZOFRAN) IV, sodium chloride  Antibiotics    Anti-infectives   Start     Dose/Rate Route Frequency Ordered Stop   09/13/14 0600  levofloxacin (LEVAQUIN) IVPB 750 mg     750 mg 100 mL/hr over 90 Minutes Intravenous On call to O.R. 09/12/14 1551 09/14/14 0559      Subjective:   [name] seen and examined today.  Patient states that she feels fine.  She is anxious about her surgery today.  She wishes "to be knocked out for the whole thing."  She denies CP, dizziness, headache, abdominal pain.   Objective:   Filed Vitals:   09/12/14 1403 09/12/14 2109 09/13/14 0510 09/13/14 0654  BP: 99/77 94/60 88/58  89/48  Pulse: 95 100 87 94  Temp:  98 F (36.7 C) 98.6 F (37 C) 98.1 F (36.7 C)   TempSrc: Oral Oral Oral   Resp: 20 18 18    Height:      Weight:      SpO2: 100% 99% 100% 96%    Wt Readings from Last 3 Encounters:  09/09/14 59.6 kg (131 lb 6.3 oz)  09/09/14 59.6 kg (131 lb 6.3 oz)  09/07/14 64.864 kg (143 lb)     Intake/Output Summary (Last 24 hours) at 09/13/14 0658 Last data filed at 09/12/14 2330  Gross per 24 hour  Intake      0 ml  Output    825 ml  Net   -825 ml    Exam  General: Well developed, well nourished, NAD, appears stated age  HEENT: NCAT, mucous membranes moist.   Cardiovascular: S1 S2 auscultated, RRR  Respiratory: Diminished but clear breath sounds.  No wheezing noted  Abdomen: Soft, nontender, nondistended, + bowel sounds  Extremities: warm dry without cyanosis clubbing or edema  Neuro: AAOx3, no focal deficits  Psych: Normal affect and demeanor  Data Review   Micro Results Recent Results (from the past 240 hour(s))  MRSA PCR SCREENING     Status: None   Collection Time    09/09/14  1:00 AM      Result Value Ref Range Status   MRSA by PCR NEGATIVE  NEGATIVE Final   Comment:            The GeneXpert MRSA Assay (FDA     approved for NASAL specimens     only), is one component of a     comprehensive MRSA colonization     surveillance program. It is not     intended to diagnose MRSA     infection nor to guide or     monitor treatment for     MRSA infections.  SURGICAL PCR SCREEN     Status: None   Collection Time    09/12/14  4:34 PM      Result Value Ref Range Status   MRSA, PCR NEGATIVE  NEGATIVE Final   Staphylococcus aureus NEGATIVE  NEGATIVE Final   Comment:            The Xpert SA Assay (FDA     approved for NASAL specimens     in patients over 55 years of age),     is one component of     a comprehensive surveillance     program.  Test performance has     been validated by [facility] for patients greater     than or equal to 1  year old.     It  is not intended     to diagnose infection nor to     guide or monitor treatment.    Radiology Reports Dg Chest 1 View  09/09/2014   CLINICAL DATA:  RIGHT pleural effusion post thoracentesis  EXAM: CHEST - 1 VIEW  COMPARISON:  Earlier exam of 09/08/2014  FINDINGS: RIGHT jugular Port-A-Cath stable tip projecting over RIGHT atrium.  Enlargement of cardiac silhouette with pulmonary vascular congestion.  Bibasilar effusions and atelectasis greater on RIGHT.  RIGHT pleural effusion has decreased since the earlier study of 09/08/2014.  No pneumothorax.  Several skin folds project over lower RIGHT chest, confirmed on repeat image.  Atherosclerotic calcification at aortic arch.  Diffuse osseous demineralization.  IMPRESSION: No pneumothorax following RIGHT thoracentesis.  Bibasilar effusions and atelectasis greater on RIGHT, with improved aeration at RIGHT base versus earlier study   Electronically Signed   By: Lavonia Dana M.D.   On: 09/09/2014 13:38   Dg Chest 2 View  09/07/2014   CLINICAL DATA:  Malignant pleural effusion.  EXAM: CHEST  2 VIEW  COMPARISON:  August 19, 2014.  FINDINGS: Interval placement of right internal jugular Port-A-Cath with distal tip in expected position of the SVC. Moderate left pleural effusion is again noted and stable. Right pleural effusion is significantly enlarged compared to prior exam. No pneumothorax is noted.  IMPRESSION: Large right pleural effusion is noted which is significantly increased in size compared to prior exam. Stable mild left pleural effusion.   Electronically Signed   By: Sabino Dick M.D.   On: 09/07/2014 10:43   Dg Chest 2 View  08/19/2014   CLINICAL DATA:  Shortness of Breath; pleural effusions  EXAM: CHEST  2 VIEW  COMPARISON:  August 12, 2014  FINDINGS: Pleural effusions, moderate, are again noted. There is bibasilar atelectatic change. There is atelectatic change in the left mid lung. There has been interval resolution of consolidation from the left  mid lung. There is no new opacity compared to recent prior study.  Heart size and pulmonary vascularity are normal. No adenopathy. No bone lesions.  IMPRESSION: Persistent effusions without appreciable change from recent prior study. Interval resolution of left mid lung consolidation. Patchy atelectasis remains in this area. There is also atelectatic change in both lung bases. There is no change in cardiac silhouette.   Electronically Signed   By: Lowella Grip M.D.   On: 08/19/2014 17:00   Dg Esophagus  08/16/2014   CLINICAL DATA:  Dysphagia.  EXAM: ESOPHOGRAM / BARIUM SWALLOW / BARIUM TABLET STUDY  TECHNIQUE: Combined double contrast and single contrast examination performed using effervescent crystals, thick barium liquid, and thin barium liquid. The patient was observed with fluoroscopy swallowing a 31mm barium sulphate tablet.  FLUOROSCOPY TIME:  2 min 48 seconds.  COMPARISON:  None.  FINDINGS: No mass or stricture is noted in the esophagus. Moderate sliding-type hiatal hernia is noted. Barium tablet passed without difficulty or delay through esophagus into stomach. No definite reflux is noted.  IMPRESSION: Moderate size sliding-type hiatal hernia is noted. No mass or stricture is noted in the esophagus.   Electronically Signed   By: Sabino Dick M.D.   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 She has been referred for port catheter placement  EXAM: IR RIGHT FLOURO GUIDE CV LINE; IR ULTRASOUND GUIDANCE VASC ACCESS RIGHT  Date: 08/30/2014  ANESTHESIA/SEDATION:  Moderate (conscious) sedation was administered during this procedure. A total of 1.0 mg Versed and 50 mg Fentanyl were administered intravenously. The patient's vital signs were monitored continuously by radiology nursing throughout the course of the procedure.  Total sedation time: 30 minutes  FLUOROSCOPY TIME:  12 seconds  TECHNIQUE: The risks and  benefits of the procedure were discussed with the patient with full informed consent and the patient wishes to proceed.  The right neck and chest was prepped with chlorhexidine, and draped in the usual sterile fashion using maximum barrier technique (cap and mask, sterile gown, sterile gloves, large sterile sheet, hand hygiene and cutaneous antiseptic). Antibiotic prophylaxis was provided with 1.0 g vancomycin administered IV one hour prior to skin incision. Local anesthesia was attained by infiltration with 1% lidocaine without epinephrine.  Ultrasound demonstrated patency of the right internal jugular vein, and this was documented with an image. Under real-time ultrasound guidance, this vein was accessed with a 21 gauge micropuncture needle and image documentation was performed. A small dermatotomy was made at the access site with an 11 scalpel. A 0.018" wire was advanced into the SVC and the access needle exchanged for a 55F micropuncture vascular sheath. The 0.018" wire was then removed and a 0.035" wire advanced into the IVC.  An appropriate location for the subcutaneous reservoir was selected below the clavicle and an incision was made through the skin and underlying soft tissues. The subcutaneous tissues were then dissected using a combination of blunt and sharp surgical technique and a pocket was formed. A single lumen power injectable portacatheter was then tunneled through the subcutaneous tissues from the pocket to the dermatotomy and the port reservoir placed within the subcutaneous pocket.  The venous access site was then serially dilated and a peel away vascular sheath placed over the wire. The wire was removed and the port catheter advanced into position under fluoroscopic guidance. The catheter tip is positioned in the cavoatrial junction. This was documented with a spot image. The portacatheter was then tested and found to flush and aspirate well. The port was flushed with saline followed by 100  units/mL heparinized saline.  The pocket was then closed in two layers using first subdermal inverted interrupted absorbable sutures followed by a running subcuticular suture. The epidermis was then sealed with Dermabond. The dermatotomy at the venous access site was also sealed with Dermabond.  COMPLICATIONS: None.  The patient tolerated the procedure well.  IMPRESSION: Status post placement of right IJ port catheter with the tip at the cavoatrial junction. The catheter is ready for use.  Signed,  Dulcy Fanny. Earleen Newport, DO  [facility]  [facility]   Electronically Signed   By: Corrie Mckusick O.D.   On: 08/30/2014 14:39   Ir US Guide Vasc Access Right  08/30/2014   CLINICAL DATA:  78 year old female with peritoneal carcinomatosis. She has been referred for port catheter placement  EXAM: IR RIGHT FLOURO GUIDE CV LINE; IR ULTRASOUND GUIDANCE VASC ACCESS RIGHT  Date: 08/30/2014  ANESTHESIA/SEDATION: Moderate (conscious) sedation was administered during this procedure. A total of 1.0 mg Versed and 50 mg Fentanyl were administered intravenously. The patient's vital signs were monitored continuously by radiology nursing throughout the course of the procedure.  Total sedation time: 30 minutes  FLUOROSCOPY TIME:  12 seconds  TECHNIQUE: The risks and benefits of the procedure were discussed with the patient with full informed consent and the patient wishes to proceed.  The right neck and chest was prepped  with chlorhexidine, and draped in the usual sterile fashion using maximum barrier technique (cap and mask, sterile gown, sterile gloves, large sterile sheet, hand hygiene and cutaneous antiseptic). Antibiotic prophylaxis was provided with 1.0 g vancomycin administered IV one hour prior to skin incision. Local anesthesia was attained by infiltration with 1% lidocaine without epinephrine.  Ultrasound demonstrated patency of the right internal jugular vein, and this was documented  with an image. Under real-time ultrasound guidance, this vein was accessed with a 21 gauge micropuncture needle and image documentation was performed. A small dermatotomy was made at the access site with an 11 scalpel. A 0.018" wire was advanced into the SVC and the access needle exchanged for a 23F micropuncture vascular sheath. The 0.018" wire was then removed and a 0.035" wire advanced into the IVC.  An appropriate location for the subcutaneous reservoir was selected below the clavicle and an incision was made through the skin and underlying soft tissues. The subcutaneous tissues were then dissected using a combination of blunt and sharp surgical technique and a pocket was formed. A single lumen power injectable portacatheter was then tunneled through the subcutaneous tissues from the pocket to the dermatotomy and the port reservoir placed within the subcutaneous pocket.  The venous access site was then serially dilated and a peel away vascular sheath placed over the wire. The wire was removed and the port catheter advanced into position under fluoroscopic guidance. The catheter tip is positioned in the cavoatrial junction. This was documented with a spot image. The portacatheter was then tested and found to flush and aspirate well. The port was flushed with saline followed by 100 units/mL heparinized saline.  The pocket was then closed in two layers using first subdermal inverted interrupted absorbable sutures followed by a running subcuticular suture. The epidermis was then sealed with Dermabond. The dermatotomy at the venous access site was also sealed with Dermabond.  COMPLICATIONS: None.  The patient tolerated the procedure well.  IMPRESSION: Status post placement of right IJ port catheter with the tip at the cavoatrial junction. The catheter is ready for use.  Signed,  Dulcy Fanny. Earleen Newport, DO  [facility]  [facility]   Electronically Signed   By: Corrie Mckusick O.D.    On: 08/30/2014 14:39   Dg Chest 2v Repeat Same Day  09/07/2014   CLINICAL DATA:  Malignant right pleural effusion, right thoracentesis  EXAM: CHEST - 2 VIEW SAME DAY  COMPARISON:  Chest x-ray of 09/07/2014  FINDINGS: There are small bilateral pleural effusions present with significant reduction in the volume of the right pleural effusion after right thoracentesis. No pneumothorax is seen. Heart size is stable. The bones are somewhat osteopenic.  IMPRESSION: Significant decrease in volume of the right pleural effusion after right thoracentesis. No pneumothorax.   Electronically Signed   By: Ivar Drape M.D.   On: 09/07/2014 13:17   Dg Chest Port 1 View  09/08/2014   CLINICAL DATA:  Intermittent central chest pain and shortness of breath. Right-sided thoracentesis yesterday. Initial presentation.  EXAM: PORTABLE CHEST - 1 VIEW  COMPARISON:  Two-view chest x-ray 09/07/2014.  FINDINGS: The heart borders are obscured. The right pleural effusion has re-accumulated, opacifying 2/3 the right hemi thorax. A small moderate left pleural effusion is present as well. Mild diffuse edema is present.  IMPRESSION: 1. Re-accumulation of right pleural effusion similar or increased to the level prior to the thoracentesis. 2. Small left pleural effusion is stable. 3. Increased edema.  Electronically Signed   By: Lawrence Santiago M.D.   On: 09/08/2014 20:08   Dg Chest Portable 1 View  08/12/2014   CLINICAL DATA:  Chest pain.  EXAM: PORTABLE CHEST - 1 VIEW  COMPARISON:  08/05/2014.  FINDINGS: The cardiac silhouette, mediastinal and hilar contours are stable. The lungs demonstrate persistent effusions and bibasilar atelectasis or infiltrates. No pneumothorax.  IMPRESSION: Persistent effusions and bilateral infiltrates or atelectasis.   Electronically Signed   By: Kalman Jewels M.D.   On: 08/12/2014 19:13   US Thoracentesis Asp Pleural Space W/img Guide  09/09/2014   CLINICAL DATA:  Recurrent RIGHT pleural effusion  EXAM:  ULTRASOUND GUIDED THERAPEUTIC RIGHT THORACENTESIS  COMPARISON:  None.  PROCEDURE: Procedure, benefits, and risks of procedure were discussed with patient.  Written informed consent for procedure was obtained.  Time out protocol followed.  Pleural effusion localized by ultrasound at the posterior RIGHT hemithorax.  Skin prepped and draped in usual sterile fashion.  Skin and soft tissues anesthetized with 5 mL of 1% lidocaine.  8 French thoracentesis catheter placed into the RIGHT pleural space.  1600 mL of yellow fluid aspirated by syringe pump.  Procedure tolerated very well by patient without immediate complication.  FINDINGS: A total of approximately 1600 mL of yellow RIGHT pleural fluid was removed. A fluid sample of 180 mL wassent for laboratory analysis.  IMPRESSION: Successful ultrasound guided RIGHT thoracentesis yielding 1600 mL of pleural fluid.   Electronically Signed   By: Lavonia Dana M.D.   On: 09/09/2014 13:48    CBC  Recent Labs Lab 09/08/14 1959 09/09/14 0621 09/11/14 0550 09/12/14 0520 09/12/14 1143  WBC 9.8 7.6 5.8 6.4 6.7  HGB 11.7* 11.4* 10.2* 10.3* 10.8*  HCT 36.0 35.7* 32.3* 32.0* 33.5*  PLT 377 331 299 277 306  MCV 79.8 80.6 79.2 78.8 79.2  MCH 25.9* 25.7* 25.0* 25.4* 25.5*  MCHC 32.5 31.9 31.6 32.2 32.2  RDW 16.9* 16.9* 17.0* 16.8* 16.9*    Chemistries   Recent Labs Lab 09/08/14 1959 09/09/14 0621 09/11/14 0550 09/12/14 0520  NA 134* 135* 135* 134*  K 4.8 4.7 3.8 3.8  CL 94* 96 94* 94*  CO2 28 30 27 29   GLUCOSE 110* 102* 98 107*  BUN 33* 31* 26* 23  CREATININE 0.99 1.05 0.95 0.86  CALCIUM 8.4 8.2* 7.8* 7.8*   ------------------------------------------------------------------------------------------------------------------ estimated creatinine clearance is 49.3 ml/min (by C-G formula based on Cr of 0.86). ------------------------------------------------------------------------------------------------------------------ No results found for this basename:  HGBA1C,  in the last 72 hours ------------------------------------------------------------------------------------------------------------------ No results found for this basename: CHOL, HDL, LDLCALC, TRIG, CHOLHDL, LDLDIRECT,  in the last 72 hours ------------------------------------------------------------------------------------------------------------------ No results found for this basename: TSH, T4TOTAL, FREET3, T3FREE, THYROIDAB,  in the last 72 hours ------------------------------------------------------------------------------------------------------------------ No results found for this basename: VITAMINB12, FOLATE, FERRITIN, TIBC, IRON, RETICCTPCT,  in the last 72 hours  Coagulation profile No results found for this basename: INR, PROTIME,  in the last 168 hours  No results found for this basename: DDIMER,  in the last 72 hours  Cardiac Enzymes  Recent Labs Lab 09/08/14 1959  TROPONINI <0.30   ------------------------------------------------------------------------------------------------------------------ No components found with this basename: POCBNP,     Tihanna Goodson D.O. on 09/13/2014 at 6:58 AM  Between 7am to 7pm - Pager - [phone]  After 7pm go to [email or web address] - password [email or web address]  And look for the night coverage person covering for me after hours  [facility] Office  [phone]

## 2014-09-13 NOTE — Brief Op Note (Signed)
09/08/2014 - 09/13/2014  1:46 PM  PATIENT:  [name]  79 y.o. female  PRE-OPERATIVE DIAGNOSIS:  malignant pleural effusion  POST-OPERATIVE DIAGNOSIS:  malignant pleural effusion  PROCEDURE:  Procedure(s): INSERTION PLEURAL DRAINAGE CATHETER (Right)   3L serous pleural fluid removed. Probably could have drained more but stopped to decrease risk of post-expansion pulmonary edema   SURGEON:  Surgeon(s) and Role:    * Gaye Pollack, MD - Primary  PHYSICIAN ASSISTANT: none  ASSISTANTS: none   ANESTHESIA:   local and IV sedation  EBL:  Total I/O In: 0  Out: 400 [Urine:400]  BLOOD ADMINISTERED:none  DRAINS: none   LOCAL MEDICATIONS USED:  LIDOCAINE   SPECIMEN:  Source of Specimen:  right pleural fluid  DISPOSITION OF SPECIMEN:  PATHOLOGY  COUNTS:  YES  TOURNIQUET:  * No tourniquets in log *  DICTATION: .Note written in EPIC  PLAN OF CARE: Admit to inpatient   PATIENT DISPOSITION:  PACU - hemodynamically stable.   Delay start of Pharmacological VTE agent (>24hrs) due to surgical blood loss or risk of bleeding: not applicable

## 2014-09-13 NOTE — Anesthesia Postprocedure Evaluation (Signed)
  Anesthesia Post-op Note  Patient: [name]  Procedure(s) Performed: Procedure(s): INSERTION PLEURAL DRAINAGE CATHETER (Right)  Patient Location: PACU  Anesthesia Type:MAC  Level of Consciousness: awake, oriented, sedated and patient cooperative  Airway and Oxygen Therapy: Patient Spontanous Breathing  Post-op Pain: none  Post-op Assessment: Post-op Vital signs reviewed, Patient's Cardiovascular Status Stable, Respiratory Function Stable, Patent Airway, No signs of Nausea or vomiting and Pain level controlled  Post-op Vital Signs: stable  Last Vitals:  Filed Vitals:   09/13/14 0654  BP: 89/48  Pulse: 94  Temp:   Resp:     Complications: No apparent anesthesia complications

## 2014-09-13 NOTE — Transfer of Care (Signed)
Immediate Anesthesia Transfer of Care Note  Patient: [name]  Procedure(s) Performed: Procedure(s): INSERTION PLEURAL DRAINAGE CATHETER (Right)  Patient Location: PACU  Anesthesia Type:MAC  Level of Consciousness: awake, alert  and oriented  Airway & Oxygen Therapy: Patient Spontanous Breathing and Patient connected to face mask oxygen  Post-op Assessment: Report given to PACU RN and Post -op Vital signs reviewed and stable  Post vital signs: Reviewed and stable  Complications: No apparent anesthesia complications

## 2014-09-13 NOTE — Care Management Note (Unsigned)
    Page 1 of 2   09/13/2014     6:26:30 PM CARE MANAGEMENT NOTE 09/13/2014  Patient:  [name], [name]   Account Number:  [id number]  Date Initiated:  09/12/2014  Documentation initiated by:  [facility]  Subjective/Objective Assessment:   Recurrent Malignant Bilateral Pleural Effusions     Action/Plan:   SNF  10/13 pluerix drain   Anticipated DC Date:  09/15/2014   Anticipated DC Plan:  SKILLED NURSING FACILITY  In-house referral  Clinical Social Worker      DC Planning Services  CM consult      Choice offered to / List presented to:             Status of service:  Completed, signed off Medicare Important Message given?  YES (If response is "NO", the following Medicare IM given date fields will be blank) Date Medicare IM given:  09/12/2014 Medicare IM given by:  [facility] Date Additional Medicare IM given:   Additional Medicare IM given by:    Discharge Disposition:  [place]  Per UR Regulation:  Reviewed for med. necessity/level of care/duration of stay  If discussed at [place] of Stay Meetings, dates discussed:    Comments:  09/13/14 [place], BSN  [phone] NCM recieved signed forms off chart and faxed to [facility].  Patient will be receiving a pleurx box to take to the snf with her.  09/12/2014 1700 NCM placed [facility] pleurx drains application on chart for attending to complete. Will fax paperwork once complete to order drains for dc to SNF when medically ready. Placed patient consent form in pt's room for pt's son, [name] POA to sign agreeing to delivery of pleurx drains. Notified attending of forms on chart. Pleurx drains once delivered to hospital will go with pt to [facility]. SNF will reoder drains as needed. Jonnie Finner RN CCM Case Mgmt phone [phone]   09/12/2014 1000 NCM spoke to pt and gave permission to speak to son, [name], [place] # [phone]. Pt's sister, [name] was in the room. Waiting for final  recommendation for dc. CSW for SNF placement. Jonnie Finner RN CCM Case Mgmt phone [phone]

## 2014-09-13 NOTE — Progress Notes (Signed)
UR completed Gerilyn Stargell K. Kelsa Jaworowski, RN, BSN, [place], CCM  09/13/2014 5:53 PM

## 2014-09-14 ENCOUNTER — Encounter (HOSPITAL_COMMUNITY): Payer: Self-pay | Admitting: Surgery

## 2014-09-14 ENCOUNTER — Inpatient Hospital Stay
Admission: RE | Admit: 2014-09-14 | Discharge: 2014-12-06 | Disposition: A | Discharge status: Skilled Nursing Facility | Payer: Medicare HMO | Source: Ambulatory Visit | Attending: Internal Medicine | Admitting: Internal Medicine

## 2014-09-14 DIAGNOSIS — J90 Pleural effusion, not elsewhere classified: Principal | ICD-10-CM

## 2014-09-14 MED ORDER — HYDROCODONE-ACETAMINOPHEN 5-325 MG PO TABS: 1.0000 | ORAL_TABLET | Freq: Four times a day (QID) | ORAL | Status: DC | PRN

## 2014-09-14 MED ORDER — FLEET ENEMA 7-19 GM/118ML RE ENEM
1.0000 | ENEMA | Freq: Every day | RECTAL | Status: DC | PRN
  Filled 2014-09-14: qty 1

## 2014-09-14 MED ORDER — LACTULOSE 10 GM/15ML PO SOLN
20.0000 g | Freq: Two times a day (BID) | ORAL | Status: DC | PRN
  Administered 2014-09-14: 20 g via ORAL
  Filled 2014-09-14: qty 30

## 2014-09-14 MED ORDER — POLYETHYLENE GLYCOL 3350 17 G PO PACK: 17.0000 g | PACK | Freq: Every day | ORAL | Status: DC

## 2014-09-14 MED ORDER — LACTULOSE 10 GM/15ML PO SOLN: 20.0000 g | Freq: Two times a day (BID) | ORAL | Status: AC | PRN

## 2014-09-14 MED ORDER — HEPARIN SOD (PORK) LOCK FLUSH 100 UNIT/ML IV SOLN
500.0000 [IU] | INTRAVENOUS | Status: AC | PRN
  Administered 2014-09-14: 500 [IU]

## 2014-09-14 MED ORDER — BISACODYL 10 MG RE SUPP
10.0000 mg | Freq: Once | RECTAL | Status: AC
  Administered 2014-09-14: 10 mg via RECTAL
  Filled 2014-09-14: qty 1

## 2014-09-14 MED ORDER — SODIUM CHLORIDE 0.9 % IV BOLUS (SEPSIS)
500.0000 mL | Freq: Once | INTRAVENOUS | Status: AC
  Administered 2014-09-14: 500 mL via INTRAVENOUS

## 2014-09-14 NOTE — Progress Notes (Signed)
MD, Coralyn Pear notified of low BP. Bolus administered per orders. BP stable upon post-bolus check. MD, Coralyn Pear at bedside to re-evaluate. Patient stable and nonsymptomatic.

## 2014-09-14 NOTE — Progress Notes (Signed)
PT Cancellation Note  Patient Details Name: [name] MRN: [id number] DOB: 16-Nov-1935   Cancelled Treatment:    Reason Eval/Treat Not Completed: Patient declined, no reason specified Pt declined participating in therapy reporting she just transferred to chair and got comfortable and feeling SOB. Will follow up later in PM if time allows.   Candy Sledge A 09/14/2014, 1:27 PM Candy Sledge, [place], DPT [phone]

## 2014-09-14 NOTE — Progress Notes (Signed)
PT Cancellation Note  Patient Details Name: [name] MRN: [id number] DOB: 1935-02-24   Cancelled Treatment:    Reason Eval/Treat Not Completed: Patient at procedure or test/unavailable Pt awaiting transport to SNF. Will follow up if pt still in hospital on 10/15.   Candy Sledge A 09/14/2014, 5:15 PM Candy Sledge, [place], DPT [phone]

## 2014-09-14 NOTE — Clinical Social Work Note (Signed)
Per RN patient's blood pressure has been low. RN to call for EMS if patient is able to discharge to [facility]. DC packet on chart. Patient and friend made aware of DC. RN given numbers for EMS. CSW signing off at this time.  Liz Beach MSW, [place], [place], [phone]

## 2014-09-14 NOTE — Discharge Summary (Signed)
Physician Discharge Summary  [name] IPJ:[id number] DOB: Feb 17, 1935 DOA: 09/08/2014  PCP: [name], MD  Admit date: 09/08/2014 Discharge date: 09/14/2014  Time spent: 35 minutes  Recommendations for Outpatient Follow-up:  1. Patient having insertion of right Pleurx catheter placement, please drain daily until evaluated by Heme/Onc in 1 week. Continue draining left Pleurx cath weekly.    Discharge Diagnoses:  Principal Problem:   Malignant pleural effusion Active Problems:   Ovarian cancer   Acute on chronic congestive heart failure   Acute on chronic respiratory failure with hypoxia   Discharge Condition: Stable  Diet recommendation: Regular  Filed Weights   09/08/14 1852 09/09/14 0036 09/09/14 2349  Weight: 62.143 kg (137 lb) 61.9 kg (136 lb 7.4 oz) 59.6 kg (131 lb 6.3 oz)    History of present illness:  [name] is a 79 y.o. female with a history of diastolic CHF, ovarian cancer with a malignant pleural effusions, status post left Pleurx catheter. The patient was recently seen by Dr. Prescott Gum of CT surgery who removed a pleural effusion on the right side. The patient began having worsening shortness of breath earlier today and was accompanied by nonradiating sharp right-sided chest pain that started earlier today. Chest pain was mild upon admission, but she continued to complain of labored breathing that which worse with exertion. The shortness of breath improved with rest. She has had a Port-A-Cath placed in preparation for chemotherapy, which is to start on Monday. She has been having increased O2 requirements and has been on nasal cannula the majority of the day.   Hospital Course:  Recurrent Malignant Bilateral Pleural Effusions  -Patient with history of Primary Peritoneal Carcinoma, having pathology of left pleural fluid showing malignant cells consistent with metastatic adenocarcinoma, collected on 08/01/2014 -S/p left pleurex catheter.  -Had right  thoracentesis 10/7 yielding 2 L and again on 10/9 yielding 1.6 L.  -During this hospitalization she had placement of right Pleurx Catheter on 09/13/2014 by Dr Cyndia Bent.    Ovarian Cancer  -Scheduled to start chemo next week.  -Previous hospitalist discussed with cancer center at [facility]; family meeting on 10/12  -Patient to follow up with her Oncologist in 1 week Acute Respiratory Failure  -Secondary to malignant pleural effusion.  -See above for details.  -Improved secondary to thoracentesis  Chronic diastolic dysfunction  -Noted on Echocardiogram 07/28/2014  -Currently euvolemic  -Do not believe that the pleural effusions are related to CHF, but rather ovarian cancer.  Chronic Anemia  -Hemoglobin appears to be stable continue to monitor CBC.   Procedures:  Insertion of PleurX Catheter on 10/13/23015, procedure performed by Dr Cyndia Bent  Consultations:  Cardiothoracic Surgery   Discharge Exam: Filed Vitals:   09/14/14 0514  BP: 107/65  Pulse: 93  Temp: 97.5 F (36.4 C)  Resp: 16   General: Well developed, well nourished, NAD, appears stated age  HEENT: NCAT, mucous membranes moist.  Cardiovascular: S1 S2 auscultated, RRR  \Respiratory: Diminished but clear breath sounds. No wheezing noted  Abdomen: Soft, nontender, nondistended, + bowel sounds  Extremities: warm dry without cyanosis clubbing or edema  Neuro: AAOx3, no focal deficits  Psych: Normal affect and demeanor   Discharge Instructions You were cared for by a hospitalist during your hospital stay. If you have any questions about your discharge medications or the care you received while you were in the hospital after you are discharged, you can call the unit and asked to speak with the hospitalist on call if the  hospitalist that took care of you is not available. Once you are discharged, your primary care physician will handle any further medical issues. Please note that NO REFILLS for any discharge medications will be  authorized once you are discharged, as it is imperative that you return to your primary care physician (or establish a relationship with a primary care physician if you do not have one) for your aftercare needs so that they can reassess your need for medications and monitor your lab values.  Discharge Instructions   Call MD for:  difficulty breathing, headache or visual disturbances    Complete by:  As directed      Call MD for:  extreme fatigue    Complete by:  As directed      Call MD for:  hives    Complete by:  As directed      Call MD for:  persistant dizziness or light-headedness    Complete by:  As directed      Call MD for:  persistant nausea and vomiting    Complete by:  As directed      Call MD for:  redness, tenderness, or signs of infection (pain, swelling, redness, odor or green/yellow discharge around incision site)    Complete by:  As directed      Call MD for:  severe uncontrolled pain    Complete by:  As directed      Call MD for:  temperature >100.4    Complete by:  As directed      Diet - low sodium heart healthy    Complete by:  As directed      Increase activity slowly    Complete by:  As directed           Current Discharge Medication List    START taking these medications   Details  lactulose (CHRONULAC) 10 GM/15ML solution Take 30 mLs (20 g total) by mouth 2 (two) times daily as needed for moderate constipation. Qty: 240 mL, Refills: 0    polyethylene glycol (MIRALAX / GLYCOLAX) packet Take 17 g by mouth daily. Qty: 14 each, Refills: 0      CONTINUE these medications which have CHANGED   Details  HYDROcodone-acetaminophen (NORCO/VICODIN) 5-325 MG per tablet Take 1 tablet by mouth every 6 (six) hours as needed for moderate pain. Take 1 tablet by mouth twice daily.  Make take 1 tablet every 6 hours as needed for pain. Qty: 30 tablet, Refills: 0   Associated Diagnoses: Peritoneal carcinoma      CONTINUE these medications which have NOT CHANGED    Details  docusate sodium (COLACE) 100 MG capsule Take 100 mg by mouth 2 (two) times daily.    metoCLOPramide (REGLAN) 5 MG tablet Take 5 mg by mouth 4 (four) times daily.    Nutritional Supplements (ENSURE CLEAR) LIQD Take 1 Bottle by mouth 3 (three) times daily.    pantoprazole (PROTONIX) 40 MG tablet Take 1 tablet (40 mg total) by mouth daily. Qty: 30 tablet, Refills: 0    potassium chloride (K-DUR) 10 MEQ tablet Take 20 mEq by mouth 2 (two) times daily.       Allergies  Allergen Reactions  . Penicillins     Unknown childhood reaction   Follow-up Information   Follow up with HAWKINS,EDWARD L, MD In 2 weeks.   Specialty:  Pulmonary Disease   Contact information:   [place]  [place] [address] [phone]  Follow up with Doroteo Bradford, MD In 1 week.   Specialty:  Hematology and Oncology   Contact information:   [place] [address] [phone]        The results of significant diagnostics from this hospitalization (including imaging, microbiology, ancillary and laboratory) are listed below for reference.    Significant Diagnostic Studies: Dg Chest 1 View  09/09/2014   CLINICAL DATA:  RIGHT pleural effusion post thoracentesis  EXAM: CHEST - 1 VIEW  COMPARISON:  Earlier exam of 09/08/2014  FINDINGS: RIGHT jugular Port-A-Cath stable tip projecting over RIGHT atrium.  Enlargement of cardiac silhouette with pulmonary vascular congestion.  Bibasilar effusions and atelectasis greater on RIGHT.  RIGHT pleural effusion has decreased since the earlier study of 09/08/2014.  No pneumothorax.  Several skin folds project over lower RIGHT chest, confirmed on repeat image.  Atherosclerotic calcification at aortic arch.  Diffuse osseous demineralization.  IMPRESSION: No pneumothorax following RIGHT thoracentesis.  Bibasilar effusions and atelectasis greater on RIGHT, with improved aeration at RIGHT base versus earlier study    Electronically Signed   By: Lavonia Dana M.D.   On: 09/09/2014 13:38   Dg Chest 2 View  09/12/2014   CLINICAL DATA:  Shortness of breath, weakness and recurrent right pleural effusion.  EXAM: CHEST  2 VIEW  COMPARISON:  09/09/2014  FINDINGS: There is a right jugular Port-A-Cath. Catheter tip in the lower SVC. There are persistent bilateral pleural effusions, right side greater the left. The right pleural effusion is moderate to large in size. Patient has a pleural drain at the left lung base. Position of this tube is unchanged. No evidence for pleural air. Evidence for compressive atelectasis bilaterally. Heart size is grossly stable but obscured by the pleural densities.  IMPRESSION: No significant change in the bilateral pleural effusions, right side greater the left.   Electronically Signed   By: Markus Daft M.D.   On: 09/12/2014 15:01   Dg Chest 2 View  09/07/2014   CLINICAL DATA:  Malignant pleural effusion.  EXAM: CHEST  2 VIEW  COMPARISON:  August 19, 2014.  FINDINGS: Interval placement of right internal jugular Port-A-Cath with distal tip in expected position of the SVC. Moderate left pleural effusion is again noted and stable. Right pleural effusion is significantly enlarged compared to prior exam. No pneumothorax is noted.  IMPRESSION: Large right pleural effusion is noted which is significantly increased in size compared to prior exam. Stable mild left pleural effusion.   Electronically Signed   By: Sabino Dick M.D.   On: 09/07/2014 10:43   Dg Chest 2 View  08/19/2014   CLINICAL DATA:  Shortness of Breath; pleural effusions  EXAM: CHEST  2 VIEW  COMPARISON:  August 12, 2014  FINDINGS: Pleural effusions, moderate, are again noted. There is bibasilar atelectatic change. There is atelectatic change in the left mid lung. There has been interval resolution of consolidation from the left mid lung. There is no new opacity compared to recent prior study.  Heart size and pulmonary vascularity are  normal. No adenopathy. No bone lesions.  IMPRESSION: Persistent effusions without appreciable change from recent prior study. Interval resolution of left mid lung consolidation. Patchy atelectasis remains in this area. There is also atelectatic change in both lung bases. There is no change in cardiac silhouette.   Electronically Signed   By: Lowella Grip M.D.   On: 08/19/2014 17:00   Dg Esophagus  08/16/2014   CLINICAL DATA:  Dysphagia.  EXAM:  ESOPHOGRAM / BARIUM SWALLOW / BARIUM TABLET STUDY  TECHNIQUE: Combined double contrast and single contrast examination performed using effervescent crystals, thick barium liquid, and thin barium liquid. The patient was observed with fluoroscopy swallowing a 43mm barium sulphate tablet.  FLUOROSCOPY TIME:  2 min 48 seconds.  COMPARISON:  None.  FINDINGS: No mass or stricture is noted in the esophagus. Moderate sliding-type hiatal hernia is noted. Barium tablet passed without difficulty or delay through esophagus into stomach. No definite reflux is noted.  IMPRESSION: Moderate size sliding-type hiatal hernia is noted. No mass or stricture is noted in the esophagus.   Electronically Signed   By: Sabino Dick M.D.   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 She has been referred for port catheter placement  EXAM: IR RIGHT FLOURO GUIDE CV LINE; IR ULTRASOUND GUIDANCE VASC ACCESS RIGHT  Date: 08/30/2014  ANESTHESIA/SEDATION: Moderate (conscious) sedation was administered during this procedure. A total of 1.0 mg Versed and 50 mg Fentanyl were administered intravenously. The patient's vital signs were monitored continuously by radiology nursing throughout the course of the procedure.  Total sedation time: 30 minutes  FLUOROSCOPY TIME:  12 seconds  TECHNIQUE: The risks and benefits of the procedure were discussed with the patient with full informed consent and the patient wishes to  proceed.  The right neck and chest was prepped with chlorhexidine, and draped in the usual sterile fashion using maximum barrier technique (cap and mask, sterile gown, sterile gloves, large sterile sheet, hand hygiene and cutaneous antiseptic). Antibiotic prophylaxis was provided with 1.0 g vancomycin administered IV one hour prior to skin incision. Local anesthesia was attained by infiltration with 1% lidocaine without epinephrine.  Ultrasound demonstrated patency of the right internal jugular vein, and this was documented with an image. Under real-time ultrasound guidance, this vein was accessed with a 21 gauge micropuncture needle and image documentation was performed. A small dermatotomy was made at the access site with an 11 scalpel. A 0.018" wire was advanced into the SVC and the access needle exchanged for a 34F micropuncture vascular sheath. The 0.018" wire was then removed and a 0.035" wire advanced into the IVC.  An appropriate location for the subcutaneous reservoir was selected below the clavicle and an incision was made through the skin and underlying soft tissues. The subcutaneous tissues were then dissected using a combination of blunt and sharp surgical technique and a pocket was formed. A single lumen power injectable portacatheter was then tunneled through the subcutaneous tissues from the pocket to the dermatotomy and the port reservoir placed within the subcutaneous pocket.  The venous access site was then serially dilated and a peel away vascular sheath placed over the wire. The wire was removed and the port catheter advanced into position under fluoroscopic guidance. The catheter tip is positioned in the cavoatrial junction. This was documented with a spot image. The portacatheter was then tested and found to flush and aspirate well. The port was flushed with saline followed by 100 units/mL heparinized saline.  The pocket was then closed in two layers using first subdermal inverted interrupted  absorbable sutures followed by a running subcuticular suture. The epidermis was then sealed with Dermabond. The dermatotomy at the venous access site was also sealed with Dermabond.  COMPLICATIONS: None.  The patient tolerated the procedure well.  IMPRESSION: Status post placement of right IJ port catheter with the tip at the cavoatrial junction.  The catheter is ready for use.  Signed,  Dulcy Fanny. Earleen Newport, DO  [facility]  [facility]   Electronically Signed   By: Corrie Mckusick O.D.   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 She has been referred for port catheter placement  EXAM: IR RIGHT FLOURO GUIDE CV LINE; IR ULTRASOUND GUIDANCE VASC ACCESS RIGHT  Date: 08/30/2014  ANESTHESIA/SEDATION: Moderate (conscious) sedation was administered during this procedure. A total of 1.0 mg Versed and 50 mg Fentanyl were administered intravenously. The patient's vital signs were monitored continuously by radiology nursing throughout the course of the procedure.  Total sedation time: 30 minutes  FLUOROSCOPY TIME:  12 seconds  TECHNIQUE: The risks and benefits of the procedure were discussed with the patient with full informed consent and the patient wishes to proceed.  The right neck and chest was prepped with chlorhexidine, and draped in the usual sterile fashion using maximum barrier technique (cap and mask, sterile gown, sterile gloves, large sterile sheet, hand hygiene and cutaneous antiseptic). Antibiotic prophylaxis was provided with 1.0 g vancomycin administered IV one hour prior to skin incision. Local anesthesia was attained by infiltration with 1% lidocaine without epinephrine.  Ultrasound demonstrated patency of the right internal jugular vein, and this was documented with an image. Under real-time ultrasound guidance, this vein was accessed with a 21 gauge micropuncture needle and  image documentation was performed. A small dermatotomy was made at the access site with an 11 scalpel. A 0.018" wire was advanced into the SVC and the access needle exchanged for a 38F micropuncture vascular sheath. The 0.018" wire was then removed and a 0.035" wire advanced into the IVC.  An appropriate location for the subcutaneous reservoir was selected below the clavicle and an incision was made through the skin and underlying soft tissues. The subcutaneous tissues were then dissected using a combination of blunt and sharp surgical technique and a pocket was formed. A single lumen power injectable portacatheter was then tunneled through the subcutaneous tissues from the pocket to the dermatotomy and the port reservoir placed within the subcutaneous pocket.  The venous access site was then serially dilated and a peel away vascular sheath placed over the wire. The wire was removed and the port catheter advanced into position under fluoroscopic guidance. The catheter tip is positioned in the cavoatrial junction. This was documented with a spot image. The portacatheter was then tested and found to flush and aspirate well. The port was flushed with saline followed by 100 units/mL heparinized saline.  The pocket was then closed in two layers using first subdermal inverted interrupted absorbable sutures followed by a running subcuticular suture. The epidermis was then sealed with Dermabond. The dermatotomy at the venous access site was also sealed with Dermabond.  COMPLICATIONS: None.  The patient tolerated the procedure well.  IMPRESSION: Status post placement of right IJ port catheter with the tip at the cavoatrial junction. The catheter is ready for use.  Signed,  Dulcy Fanny. Earleen Newport, DO  [facility]  [facility]   Electronically Signed   By: Corrie Mckusick O.D.   On: 08/30/2014 14:39   Dg Chest 2v Repeat Same Day  09/07/2014   CLINICAL DATA:  Malignant right pleural  effusion, right thoracentesis  EXAM: CHEST - 2 VIEW SAME DAY  COMPARISON:  Chest x-ray of 09/07/2014  FINDINGS: There are small bilateral pleural  effusions present with significant reduction in the volume of the right pleural effusion after right thoracentesis. No pneumothorax is seen. Heart size is stable. The bones are somewhat osteopenic.  IMPRESSION: Significant decrease in volume of the right pleural effusion after right thoracentesis. No pneumothorax.   Electronically Signed   By: Ivar Drape M.D.   On: 09/07/2014 13:17   Dg Chest Port 1 View  09/13/2014   CLINICAL DATA:  PleurX catheter placement on right.  EXAM: PORTABLE CHEST - 1 VIEW  COMPARISON:  September 12, 2014  FINDINGS: PleurX type catheter placed at right base. Port-A-Cath tip at cavoatrial junction. There is a chest tube on the left, stable in position. There is a small pneumothorax at the lateral right base, slightly superior to the PleurX catheter.  There remains moderate effusion on the right, smaller than compared to prior chest tube placement. There is a small effusion on the left as well, stable. There is consolidation in medial bases, stable. Heart is mildly enlarged with pulmonary vascularity, stable. No adenopathy.  IMPRESSION: New PleurX type catheter placed at right base. Effusion smaller but nonetheless moderate on the right. Suspect small lateral right pneumothorax without tension component. Stable chest tube on the left. Stable effusion on the left. Areas of consolidation atelectasis remain stable in both lower lung zone regions. No change in cardiac silhouette.   Electronically Signed   By: Lowella Grip M.D.   On: 09/13/2014 14:39   Dg Chest Port 1 View  09/08/2014   CLINICAL DATA:  Intermittent central chest pain and shortness of breath. Right-sided thoracentesis yesterday. Initial presentation.  EXAM: PORTABLE CHEST - 1 VIEW  COMPARISON:  Two-view chest x-ray 09/07/2014.  FINDINGS: The heart borders are obscured. The  right pleural effusion has re-accumulated, opacifying 2/3 the right hemi thorax. A small moderate left pleural effusion is present as well. Mild diffuse edema is present.  IMPRESSION: 1. Re-accumulation of right pleural effusion similar or increased to the level prior to the thoracentesis. 2. Small left pleural effusion is stable. 3. Increased edema.   Electronically Signed   By: Lawrence Santiago M.D.   On: 09/08/2014 20:08   US Thoracentesis Asp Pleural Space W/img Guide  09/09/2014   CLINICAL DATA:  Recurrent RIGHT pleural effusion  EXAM: ULTRASOUND GUIDED THERAPEUTIC RIGHT THORACENTESIS  COMPARISON:  None.  PROCEDURE: Procedure, benefits, and risks of procedure were discussed with patient.  Written informed consent for procedure was obtained.  Time out protocol followed.  Pleural effusion localized by ultrasound at the posterior RIGHT hemithorax.  Skin prepped and draped in usual sterile fashion.  Skin and soft tissues anesthetized with 5 mL of 1% lidocaine.  8 French thoracentesis catheter placed into the RIGHT pleural space.  1600 mL of yellow fluid aspirated by syringe pump.  Procedure tolerated very well by patient without immediate complication.  FINDINGS: A total of approximately 1600 mL of yellow RIGHT pleural fluid was removed. A fluid sample of 180 mL wassent for laboratory analysis.  IMPRESSION: Successful ultrasound guided RIGHT thoracentesis yielding 1600 mL of pleural fluid.   Electronically Signed   By: Lavonia Dana M.D.   On: 09/09/2014 13:48    Microbiology: Recent Results (from the past 240 hour(s))  MRSA PCR SCREENING     Status: None   Collection Time    09/09/14  1:00 AM      Result Value Ref Range Status   MRSA by PCR NEGATIVE  NEGATIVE Final   Comment:  The GeneXpert MRSA Assay (FDA     approved for NASAL specimens     only), is one component of a     comprehensive MRSA colonization     surveillance program. It is not     intended to diagnose MRSA     infection nor  to guide or     monitor treatment for     MRSA infections.  SURGICAL PCR SCREEN     Status: None   Collection Time    09/12/14  4:34 PM      Result Value Ref Range Status   MRSA, PCR NEGATIVE  NEGATIVE Final   Staphylococcus aureus NEGATIVE  NEGATIVE Final   Comment:            The Xpert SA Assay (FDA     approved for NASAL specimens     in patients over 84 years of age),     is one component of     a comprehensive surveillance     program.  Test performance has     been validated by [employer] for patients greater     than or equal to 29 year old.     It is not intended     to diagnose infection nor to     guide or monitor treatment.     Labs: Basic Metabolic Panel:  Recent Labs Lab 09/08/14 1959 09/09/14 0621 09/11/14 0550 09/12/14 0520 09/13/14 0502  NA 134* 135* 135* 134* 135*  K 4.8 4.7 3.8 3.8 3.5*  CL 94* 96 94* 94* 93*  CO2 28 30 27 29 28   GLUCOSE 110* 102* 98 107* 94  BUN 33* 31* 26* 23 23  CREATININE 0.99 1.05 0.95 0.86 0.96  CALCIUM 8.4 8.2* 7.8* 7.8* 7.9*   Liver Function Tests: No results found for this basename: AST, ALT, ALKPHOS, BILITOT, PROT, ALBUMIN,  in the last 168 hours No results found for this basename: LIPASE, AMYLASE,  in the last 168 hours No results found for this basename: AMMONIA,  in the last 168 hours CBC:  Recent Labs Lab 09/08/14 1959 09/09/14 0621 09/11/14 0550 09/12/14 0520 09/12/14 1143  WBC 9.8 7.6 5.8 6.4 6.7  HGB 11.7* 11.4* 10.2* 10.3* 10.8*  HCT 36.0 35.7* 32.3* 32.0* 33.5*  MCV 79.8 80.6 79.2 78.8 79.2  PLT 377 331 299 277 306   Cardiac Enzymes:  Recent Labs Lab 09/08/14 1959  TROPONINI <0.30   BNP: BNP (last 3 results)  Recent Labs  07/27/14 1942 09/08/14 1959  PROBNP 456.7* 1531.0*   CBG: No results found for this basename: GLUCAP,  in the last 168 hours     Signed:  Kelvin Cellar  [facility] 09/14/2014, 12:51 PM

## 2014-09-14 NOTE — Progress Notes (Signed)
09/14/14 1444  Vitals  BP ! 84/62 mmHg  BP Location Right arm  notified MD

## 2014-09-19 ENCOUNTER — Inpatient Hospital Stay (HOSPITAL_COMMUNITY): Payer: Medicare HMO

## 2014-09-19 ENCOUNTER — Ambulatory Visit (HOSPITAL_COMMUNITY): Payer: Medicare HMO

## 2014-09-19 ENCOUNTER — Non-Acute Institutional Stay (SKILLED_NURSING_FACILITY): Payer: Medicare HMO | Admitting: Internal Medicine

## 2014-09-19 ENCOUNTER — Ambulatory Visit (HOSPITAL_COMMUNITY): Payer: Medicare HMO | Admitting: Oncology

## 2014-09-19 ENCOUNTER — Ambulatory Visit (HOSPITAL_COMMUNITY): Payer: Medicare HMO | Attending: Internal Medicine

## 2014-09-19 DIAGNOSIS — E43 Unspecified severe protein-calorie malnutrition: Secondary | ICD-10-CM

## 2014-09-19 DIAGNOSIS — R079 Chest pain, unspecified: Secondary | ICD-10-CM | POA: Diagnosis not present

## 2014-09-19 DIAGNOSIS — J90 Pleural effusion, not elsewhere classified: Secondary | ICD-10-CM | POA: Diagnosis not present

## 2014-09-19 DIAGNOSIS — J910 Malignant pleural effusion: Secondary | ICD-10-CM

## 2014-09-19 DIAGNOSIS — R05 Cough: Secondary | ICD-10-CM | POA: Diagnosis present

## 2014-09-19 DIAGNOSIS — C579 Malignant neoplasm of female genital organ, unspecified: Secondary | ICD-10-CM

## 2014-09-19 DIAGNOSIS — I5030 Unspecified diastolic (congestive) heart failure: Secondary | ICD-10-CM

## 2014-09-19 NOTE — Progress Notes (Signed)
Patient ID: [name], female   DOB: 1934-12-23, 79 y.o.   MRN: [id number]   This is an acute visit.  Level of care skilled.  Facility [facility].   Chief complaint-acute visit followup constipation-rectal bleeding-Gerdn-malignant pleural effusion-CHF.   History of present illness.   Patient is a very pleasant 79 year old female who had a several month history of weight loss and shortness of breath with complaints of eating causing vomiting.  She also had a 36 pound weight loss.  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  She had a Pleurx catheter placed one month ago.  She is followed by oncology and actually will be started on chemotherapy here this month.  She went to the ER on September 11 complaining of chest pain this was thought to be noncardiac and GERD related and she was started on Protonix.  Dr. Dellia Nims also did order an esophageal gram which showed a moderate sized sliding type hiatal hernia no mass or stricture.  She continues to complain of some vomiting and difficulty eating at times  It appears she's lost about 7 pounds since admission-however she did have Lasix initiated as with concerns of diastolic CHF which she has a diagnosis of--- or fluid loss may partially explain this.  She also complains of constipation and apparently has somewhat bloody stool with attempts to have a bowel movement-this is thought possibly strain related-.  she did receive milk of magnesia couple days ago and fleets enema last night apparently with some results.  She feels she would like to be on a routine laxative which I think is certainly warranted.  In regards to her pleural effusion-continued with the drain-this is followed by oncology to assess her respiratory status remains at baseline has some shortness of breath with exertion but says this is nothing new-2 stats have been in the 90s on oxygen.     Family medical social history as been reviewed most recently progress note on 08/15/2014.   Medications have been reviewed per MAR .  Review of systems.  In general does not complaining of fever chills again she appears to have lost some weight as noted above.  Skin does not complaining of rash or itching.  Eyes did not visual changes.  Oral pharynx not complain a sore throat but does complain of dysphagia saying that food goes down it feels that it's going to slowly and sometimes feels like it's getting caught somewhere in the mid esophagus.  Respiratory as at time shortness of breath with exertion she says this is not new otherwise did not complaining of increased shortness of breath.  Cardiac does not complaining of chest pain has a history of diastolic CHF is now on low dose Lasix.  GI-does not really complaining of abdominal discomfort but does complain of dysphagia as noted above  GU does not complain of dysuria.  Muscle skeletal has generalized weakness but did not complain specifically of joint pain.  Neurologic did not complain of dizziness or headache or syncopal-type feelings.  Psych does not appear overtly anxious or depressed .  Physical exam.  298.4 pulse 88 respirations 18 blood pressure 1/68-O2 saturation 95% on oxygen-weight is 137.8.  In general this is a frail elderly female who appears to have declined somewhat since I've last seen her somewhat more weak.  Her skin is warm and dry.  Oropharynx clear mucous membranes moist.  Chest does have shallow air entry bilaterally  especially at the bases there is no labored breathing she is on oxygen.  Heart is regular rate and rhythm without murmur gallop or rub she has trace lower extremity edema.  Abdomen is somewhat protuberant soft nontender with positive bowel sounds  Rectal-appears to have somewhat small external hemorrhoid I do not see any active bleeding.  Muscle skeletal general frailty does move all extremities however  at baseline.  Neurologic is grossly intact her speech is clear.  Psych continues to be alert and oriented very pleasant and appropriate .  Labs.  08/30/2014.  WBC 8.2 hemoglobin 11.7 platelets 524.  Sept 22nd 2015.  Sodium 137 potassium 3.7 BUN 26 creatinine 0.1.  Albumin 1.9 otherwise liver function tests within normal limits.   Assessment and plan.   #1-history constipation with possible rectal bleeding-her hemoglobin continues to be stable-at this point patient is not a good candidate for aggressive GI workup but we'll have to monitor her blood levels and clinical status closely-will start a routine laxative twice a day--- Colace 100 mg twice a day-she continues with when necessary agents   #2-dysphagia-I have reviewed her records esophogram noted above did not show any stricture or mass-challenging situation patient's comorbidities-she has lost some weight although this could be somewhat fluid related with her history of CHF on a diuretic now-we'll order a dietary consult she is on Ensure 3 times a day-my understanding is dietary has been following her status quite closely--I suspect this will continue to be somewhat of a challenge.  Also will order a metabolic panel to check her renal function and electrolytes although this appeared to be fairly stable   #3 severe protein calorie malnutrition-albumin has shown some slight improvement per recent lab although still significantly reduced at 1.9-again she is on supplements and will have dietary look at this-   #9-FXTKWIO of diastolic CHF she is on low dose Lasix-continue to monitor clinically respiratory status appears to be at baseline.   #5 history of malignant pleural effusion-again this is followed by oncology and she is slated to start chemotherapy shortly-apparently she will be going to a class on Monday-   Speaking with patient's daughter-apparently there has been some concern about patient being discharged from therapy secondary to  apparently limited ability to participate with her numerous diagnoses and weakness--there is concern that this may prompt discharge from this facility-I assured family that social work will be working on this and the patient at this point appears to be a poor candidate for going home and would most likely benefit from staying in a skilled situation at this time-again this will have to be followed up by social work as well.   [id number] note greater than 35 minutes spent assessing patient-addressing patient's and her daughter's concerns at bedside as well as reviewing her chart studies labs-and coordinating and formulating a plan of care for numerous diagnoses-of note greater than 50% of time spent coordinating plan of care

## 2014-09-19 NOTE — Progress Notes (Signed)
Patient ID: [name], female   DOB: 05/17/1935, 79 y.o.   MRN: [id number]  Facility; [facility] Chief complaint; admission to SNF post admit to [facility] from 10/8 to 10/14  History ; This is a patient they came here roughly a month ago. She was noted  to have a metastatic pleural effusion presumed to be gynecologic/ovarian. She came here with a Pleurx drain on the left that. As I understand things she went to see Dr. Lucianne Lei trite to remove the pleural drain on the left. However she was discovered to have a worsening effusion on the left associated with shortness of breath and chest pain on the right. The patient had had a recent Port-A-Cath in preparation for chemotherapy to start on Monday. She had a right thoracentesis for the yielding 2 L of fluid and again on 51 76-year-old and 1.6 L of fluid. She appears to be draining 500 cc out out of the right Pleurx catheter even in the nursing home. The left does not drain nearly as much. However I think there is considerable left pleural effusion with the opening of the Pleurx catheter above that level on the left. As far as I see things I do not think that the fluid on the right underwent further analysis. I'm not even exactly sure of the nature of her current cancer i.e. whether this is ovarian or not. She saw Dr. Skeet Duke of gynecologic oncology on 9/3 and at that point was noted to have an adnexal mass presumed ovarian. She is also known to have ascites from a CT scan [see below] at the end of August. One would have to wonder whether she has peritoneal carcinomatosis.   ------------------------------------------------------------------- Transthoracic Echocardiography  [name], [name] MR #:       [id number] Study Date: 07/28/2014 Gender:     F Age:        57 Height:     165.1 cm Weight:     71.7 kg BSA:        1.83 m^2 Pt. Status: Room:       A320   ADMITTING    [name], [place], RDCS  ORDERING     [name], [name], [place], [name]  PERFORMING   [name], [name]  cc:  ------------------------------------------------------------------- LV EF: 60% -   65%  ------------------------------------------------------------------- Indications:      Edema 782.3.  Dyspnea 786.09.  Pleural effusion 511.9.  ------------------------------------------------------------------- History:   PMH:   Chest pain.  Dyspnea. No prior cardiac history.   ------------------------------------------------------------------- Study Conclusions  - Procedure narrative: Technically difficult study. - Left ventricle: The cavity size was normal. Systolic function was   normal. The estimated ejection fraction was in the range of 60%   to 65%. Doppler parameters are consistent with abnormal left   ventricular relaxation (grade 1 diastolic dysfunction).   Indeterminate LV filling pressures. Moderate posterior wall and   severe focal basal septal hypertrophy. - Aortic valve: Mildly to moderately calcified annulus. Mildly   thickened leaflets. - Mitral valve: Mildly calcified annulus. Mildly thickened leaflets   . - Pericardium, extracardiac: Large left pleural effusion noted.  Transthoracic echocardiography.  M-mode, complete 2D, spectral Doppler, and color Doppler.  Birthdate:  Patient birthdate: [birth year].  Age:  Patient is 79 yr old.  Sex:  Gender: female. BMI: 26.3 kg/m^2.  Blood pressure:   Social Review  109/57  Patient status: Inpatient.  Study date:  Study date: 07/28/2014. Study time: 02:35 PM.  Location:  Bedside.  -------------------------------------------------------------------  ------------------------------------------------------------------- Left ventricle:  The cavity size was normal. Systolic function was normal. The estimated ejection fraction was in the range of 60% to 65%. Images were inadequate for LV wall motion assessment. Moderate posterior wall and  severe focal basal septal hypertrophy. Doppler parameters are consistent with abnormal left ventricular relaxation (grade 1 diastolic dysfunction). Indeterminate LV filling pressures.  ------------------------------------------------------------------- Aortic valve:   Mildly to moderately calcified annulus. Mildly thickened leaflets.  Doppler:   There was no stenosis.   There was no regurgitation.    Valve area (VTI): 1.83 cm^2. Indexed valve area (VTI): 1 cm^2/m^2. Peak velocity ratio of LVOT to aortic valve: 0.8. Valve area (Vmax): 2.03 cm^2. Indexed valve area (Vmax): 1.11 cm^2/m^2.    Peak gradient (S): 11 mm Hg.  ------------------------------------------------------------------- Aorta:  Aortic root: The aortic root was normal in size.  ------------------------------------------------------------------- Mitral valve:   Mildly calcified annulus. Mildly thickened leaflets .  Doppler:  There was trivial regurgitation.  ------------------------------------------------------------------- Left atrium:  The atrium was at the upper limits of normal in size.   ------------------------------------------------------------------- Atrial septum:  Poorly visualized.  ------------------------------------------------------------------- Right ventricle:  The cavity size was normal. Wall thickness was normal. Systolic function was normal.  ------------------------------------------------------------------- Pulmonic valve:    The valve appears to be grossly normal.  ------------------------------------------------------------------- Tricuspid valve:  Poorly visualized.  Doppler:  There was no significant regurgitation.  ------------------------------------------------------------------- Right atrium:  The atrium was normal in size.  ------------------------------------------------------------------- Pericardium:  Large left pleural effusion noted. Not  well visualized.  ------------------------------------------------------------------- Systemic veins: Inferior vena cava: The vessel was normal in size. The respirophasic diameter changes were in the normal range (>= 50%), consistent with normal central venous pressure.      CLINICAL DATA:  79 year old female with shortness of breath, chest, abdominal and pelvic pain and pleural effusion.   EXAM: CT CHEST, ABDOMEN, AND PELVIS WITH CONTRAST   TECHNIQUE: Multidetector CT imaging of the chest, abdomen and pelvis was performed following the standard protocol during bolus administration of intravenous contrast.   CONTRAST:  139mL OMNIPAQUE IOHEXOL 300 MG/ML  SOLN   COMPARISON:  None.   FINDINGS: CT CHEST FINDINGS   The heart and great vessels are within normal limits.   A large left pleural effusion and very small right pleural effusion are noted with bilateral lower lung atelectasis, left greater than right.   A tiny pleural pericardial effusion is noted.   There is no evidence of pleural mass or definite thickening.   There is no evidence of airspace disease, definite mass, nodule, or endobronchial/ endotracheal lesion.   No enlarged lymph nodes identified.   Mild subcutaneous edema is present.   No acute or suspicious bony abnormalities are noted.   CT ABDOMEN AND PELVIS FINDINGS   Moderate ascites is present.   Medial left/ anterior right liver scarring is noted. No focal hepatic abnormalities are otherwise identified.   Spleen, pancreas, adrenal glands and kidneys are unremarkable except for bilateral renal cortical atrophy. Patient is status post cholecystectomy.   There is no evidence of biliary dilatation, enlarged lymph nodes or abdominal aortic aneurysm.   Extensive descending and sigmoid colonic diverticulosis noted without definite diverticulitis.   There is no evidence of bowel obstruction, peritoneal thickening or nodularity or  pneumoperitoneum.   Ill-defined increased density measuring 3 x 8 cm within the mesenteric is nonspecific.   No acute  or suspicious bony abnormalities are identified.   IMPRESSION: Large left pleural effusion, moderate ascites, diffuse subcutaneous edema and small right pleural effusion. Associated bilateral lower lung atelectasis.   3 x 8 cm ill-defined density within the mesentery - solid mass/neoplasm is not excluded. Consider PET-CT for further evaluation as clinically indicated.   Bilateral renal cortical atrophy and colonic diverticulosis.     Electronically Signed   By: Hassan Rowan M.D.   On: 07/29/2014 12:07       has a past medical history of GERD (gastroesophageal reflux disease); Osteoarthritis; Peripheral edema; CHF (congestive heart failure), NYHA class IV; and Cancer.   Past Surgical History  Procedure Laterality Date  . Cholecystectomy    . Cardiac catheterization    . Total abdominal hysterectomy  40 years ago    patient does not know if ovaries were removed  . Appendectomy    . Total knee arthroplasty Left   . Chest tube insertion Left 08/04/2014    Procedure: INSERTION PLEURAL DRAINAGE CATHETER;  Surgeon: Ivin Poot, MD;  Location: [facility];  Service: Thoracic;  Laterality: Left;  . Chest tube insertion Right 09/13/2014    Procedure: INSERTION PLEURAL DRAINAGE CATHETER;  Surgeon: Gaye Pollack, MD;  Location: [facility];  Service: Thoracic;  Laterality: Right;    Current Outpatient Prescriptions on File Prior to Visit  Medication Sig Dispense Refill  . docusate sodium (COLACE) 100 MG capsule Take 100 mg by mouth 2 (two) times daily.      Marland Kitchen HYDROcodone-acetaminophen (NORCO/VICODIN) 5-325 MG per tablet Take 1 tablet by mouth every 6 (six) hours as needed for moderate pain. Take 1 tablet by mouth twice daily.  Make take 1 tablet every 6 hours as needed for pain.  30 tablet  0  . lactulose (CHRONULAC) 10 GM/15ML solution Take 30 mLs (20 g total) by mouth 2 (two)  times daily as needed for moderate constipation.  240 mL  0  . metoCLOPramide (REGLAN) 5 MG tablet Take 5 mg by mouth 4 (four) times daily.      . Nutritional Supplements (ENSURE CLEAR) LIQD Take 1 Bottle by mouth 3 (three) times daily.      . pantoprazole (PROTONIX) 40 MG tablet Take 1 tablet (40 mg total) by mouth daily.  30 tablet  0  . polyethylene glycol (MIRALAX / GLYCOLAX) packet Take 17 g by mouth daily.  14 each  0  . potassium chloride (K-DUR) 10 MEQ tablet Take 20 mEq by mouth 2 (two) times daily.         Social DNR; The patient was previously living at home apparently surrounded by family  Review of systems Resp sob O2 on Abd: pain constipation no bm in 5 days CVS; no clear orthopenia GU no dysuria   Physical  Gen: increasing frail. Still alert and conversational Vitals; O2 sat 93% on 2l RR 22 P 97 Resp: Decreased a/e bilateraly with dullness to percussion bilat. Suspect bilateral pleural effusions CVS: no convincing evidence of left or right ventricular failure. JVP is not elevated ABD: Mildly distended and tender no guarding. No live GU bladder is not distended Neurolgic: still has antigravity strength in her legs. No sensory level  Impressions 1) widely metastatic carcinoma likely Ovarian. Now with bilateral presumably malignant effusions and ?peritoneal mets??. Patient is increasingly frail. Would be clearly hospice eligible. Has THIS BEEN DISCUSSED???? She is DNR 2) profound protein calorie malnutrition 3)History of severe diastolic CHF; I see no evidence of this  She  now has a cath for chemo. I suspect she will tolerate this poorly. Based on clinical exam and all of its inadequacies, it would appear that she still has sizable bilateral pleural effusions. She will likely need a followup chest x-ray AP sitting   [name], [name] (MRN [id number]) as of 09/19/2014 10:38  Ref. Range 08/23/2014 11:23 08/30/2014 12:10 09/08/2014 19:59 09/09/2014 06:21 09/11/2014  05:50 09/12/2014 05:20 09/12/2014 11:43 09/13/2014 05:02  Sodium Latest Range: 137-147 mEq/L 137  134 (L) 135 (L) 135 (L) 134 (L)  135 (L)  Potassium Latest Range: 3.7-5.3 mEq/L 3.7  4.8 4.7 3.8 3.8  3.5 (L)  Chloride Latest Range: 96-112 mEq/L 96  94 (L) 96 94 (L) 94 (L)  93 (L)  CO2 Latest Range: 19-32 mEq/L 31  28 30 27 29  28   BUN Latest Range: 6-23 mg/dL 26 (H)  33 (H) 31 (H) 26 (H) 23  23  Creatinine Latest Range: 0.50-1.10 mg/dL 0.81  0.99 1.05 0.95 0.86  0.96  Calcium Latest Range: 8.4-10.5 mg/dL 8.5  8.4 8.2 (L) 7.8 (L) 7.8 (L)  7.9 (L)  GFR calc non Af Amer Latest Range: >90 mL/min 68 (L)  54 (L) 50 (L) 56 (L) 64 (L)  56 (L)  GFR calc Af Amer Latest Range: >90 mL/min 79 (L)  62 (L) 58 (L) 65 (L) 74 (L)  64 (L)  Glucose Latest Range: 70-99 mg/dL 116 (H)  110 (H) 102 (H) 98 107 (H)  94  Anion gap Latest Range: 5-15  10  12 9 14 11  14   Alkaline Phosphatase Latest Range: 39-117 U/L 89         Albumin Latest Range: 3.5-5.2 g/dL 1.9 (L)         AST Latest Range: 0-37 U/L 15         ALT Latest Range: 0-35 U/L 9         Total Protein Latest Range: 6.0-8.3 g/dL 6.9         Total Bilirubin Latest Range: 0.3-1.2 mg/dL 0.3         Troponin I Latest Range: <0.30 ng/mL   <0.30       Pro B Natriuretic peptide (BNP) Latest Range: 0-450 pg/mL   1531.0 (H)       WBC Latest Range: 4.0-10.5 K/uL 7.2 8.2 9.8 7.6 5.8 6.4 6.7   RBC Latest Range: 3.87-5.11 MIL/uL 4.32 4.57 4.51 4.43 4.08 4.06 4.23   Hemoglobin Latest Range: 12.0-15.0 g/dL 11.2 (L) 11.7 (L) 11.7 (L) 11.4 (L) 10.2 (L) 10.3 (L) 10.8 (L)   HCT Latest Range: 36.0-46.0 % 34.8 (L) 36.3 36.0 35.7 (L) 32.3 (L) 32.0 (L) 33.5 (L)   MCV Latest Range: 78.0-100.0 fL 80.6 79.4 79.8 80.6 79.2 78.8 79.2   MCH Latest Range: 26.0-34.0 pg 25.9 (L) 25.6 (L) 25.9 (L) 25.7 (L) 25.0 (L) 25.4 (L) 25.5 (L)   MCHC Latest Range: 30.0-36.0 g/dL 32.2 32.2 32.5 31.9 31.6 32.2 32.2   RDW Latest Range: 11.5-15.5 % 16.5 (H) 16.8 (H) 16.9 (H) 16.9 (H) 17.0 (H) 16.8 (H)  16.9 (H)   Platelets Latest Range: 150-400 K/uL 514 (H) 524 (H) 377 331 299 277 306   Neutrophils Relative % Latest Range: 43-77 % 69 65        Lymphocytes Relative Latest Range: 12-46 % 21 25        Monocytes Relative Latest Range: 3-12 % 7 8        Eosinophils Relative Latest Range:  0-5 % 2 1        Basophils Relative Latest Range: 0-1 % 1 1        NEUT# Latest Range: 1.7-7.7 K/uL 5.0 5.3        Lymphocytes Absolute Latest Range: 0.7-4.0 K/uL 1.5 2.1        Monocytes Absolute Latest Range: 0.1-1.0 K/uL 0.5 0.7        Eosinophils Absolute Latest Range: 0.0-0.7 K/uL 0.1 0.1        Basophils Absolute Latest Range: 0.0-0.1 K/uL 0.1 0.0        Prothrombin Time Latest Range: 11.6-15.2 seconds  13.2        INR Latest Range: 0.00-1.49   1.00        APTT Latest Range: 24-37 seconds  34

## 2014-09-21 ENCOUNTER — Encounter (HOSPITAL_BASED_OUTPATIENT_CLINIC_OR_DEPARTMENT_OTHER): Payer: Medicare HMO

## 2014-09-21 ENCOUNTER — Encounter (HOSPITAL_COMMUNITY): Payer: Self-pay

## 2014-09-21 ENCOUNTER — Other Ambulatory Visit: Payer: Self-pay | Admitting: *Deleted

## 2014-09-21 VITALS — BP 101/44 | HR 89 | Temp 97.5°F | Resp 18 | Wt 131.1 lb

## 2014-09-21 DIAGNOSIS — C482 Malignant neoplasm of peritoneum, unspecified: Secondary | ICD-10-CM

## 2014-09-21 DIAGNOSIS — J910 Malignant pleural effusion: Secondary | ICD-10-CM

## 2014-09-21 DIAGNOSIS — N189 Chronic kidney disease, unspecified: Secondary | ICD-10-CM | POA: Diagnosis present

## 2014-09-21 DIAGNOSIS — C569 Malignant neoplasm of unspecified ovary: Secondary | ICD-10-CM | POA: Diagnosis present

## 2014-09-21 DIAGNOSIS — D631 Anemia in chronic kidney disease: Secondary | ICD-10-CM | POA: Diagnosis present

## 2014-09-21 LAB — CBC WITH DIFFERENTIAL/PLATELET
BASOS PCT: 1 % (ref 0–1)
Basophils Absolute: 0 10*3/uL (ref 0.0–0.1)
EOS ABS: 0.1 10*3/uL (ref 0.0–0.7)
Eosinophils Relative: 2 % (ref 0–5)
HEMATOCRIT: 32.6 % — AB (ref 36.0–46.0)
HEMOGLOBIN: 10.2 g/dL — AB (ref 12.0–15.0)
Lymphocytes Relative: 26 % (ref 12–46)
Lymphs Abs: 1.3 10*3/uL (ref 0.7–4.0)
MCH: 25.6 pg — ABNORMAL LOW (ref 26.0–34.0)
MCHC: 31.3 g/dL (ref 30.0–36.0)
MCV: 81.7 fL (ref 78.0–100.0)
MONO ABS: 0.4 10*3/uL (ref 0.1–1.0)
MONOS PCT: 7 % (ref 3–12)
Neutro Abs: 3.2 10*3/uL (ref 1.7–7.7)
Neutrophils Relative %: 64 % (ref 43–77)
Platelets: 365 10*3/uL (ref 150–400)
RBC: 3.99 MIL/uL (ref 3.87–5.11)
RDW: 17.8 % — ABNORMAL HIGH (ref 11.5–15.5)
WBC: 5 10*3/uL (ref 4.0–10.5)

## 2014-09-21 LAB — COMPREHENSIVE METABOLIC PANEL
ALT: 11 U/L (ref 0–35)
ANION GAP: 9 (ref 5–15)
AST: 17 U/L (ref 0–37)
Albumin: 1.6 g/dL — ABNORMAL LOW (ref 3.5–5.2)
Alkaline Phosphatase: 101 U/L (ref 39–117)
BUN: 26 mg/dL — AB (ref 6–23)
CALCIUM: 8.2 mg/dL — AB (ref 8.4–10.5)
CO2: 25 mEq/L (ref 19–32)
CREATININE: 0.72 mg/dL (ref 0.50–1.10)
Chloride: 104 mEq/L (ref 96–112)
GFR calc Af Amer: >90 mL/min (ref >90)
GFR calc non Af Amer: 81 mL/min — ABNORMAL LOW (ref >90)
Glucose, Bld: 106 mg/dL — ABNORMAL HIGH (ref 70–99)
Potassium: 4.6 mEq/L (ref 3.7–5.3)
Sodium: 138 mEq/L (ref 137–147)
TOTAL PROTEIN: 6.2 g/dL (ref 6.0–8.3)
Total Bilirubin: <0.2 mg/dL — ABNORMAL LOW (ref 0.3–1.2)

## 2014-09-21 LAB — SAMPLE TO BLOOD BANK

## 2014-09-21 MED ORDER — HYDROCODONE-ACETAMINOPHEN 5-325 MG PO TABS: ORAL_TABLET | ORAL | Status: DC

## 2014-09-21 NOTE — Telephone Encounter (Signed)
[facility] 

## 2014-09-21 NOTE — Progress Notes (Signed)
[facility]  OFFICE PROGRESS NOTE  [name],Sarah Carlean Jews, MD [place] [address]  DIAGNOSIS: Peritoneal carcinoma - Plan: CBC with Differential, Comprehensive metabolic panel, CA 264, CBC with Differential, Comprehensive metabolic panel, CA 158, Sample to Blood Bank, Sample to Blood Bank  Malignant pleural effusion, bilateral  Chief Complaint  Patient presents with  . Follow-up  . Peritoneal carcinomatosis with bilateral malignant pleural e    CURRENT THERAPY: Pleuryx catheter in the left and right pleural space for malignant pleural effusions in the setting of abdominal carcinomatosis and ascites, probably representing primary peritoneal carcinoma. 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 Recently discharged from hospital on 09/14/2014 when the right Pleuryx catheter was inserted at which time diastolic congestive heart failure was diagnosed. LifePort has been inserted and plan is to deliver carboplatin Taxol chemotherapy on day 1, 8, and 15 of every 28 day cycle. Treatment was to start on 09/13/2014 the patient was admitted with onset of right pleural effusion. Status post paracentesis on 09/20/2014.  INTERVAL HISTORY: [name] 79 y.o. female returns for followup after recent hospitalization for right malignant pleural effusion at which time Pleurx catheter was inserted on the right having had one inserted on the left in the past. She's also had a lifeport inserted on 08/30/2014. She is currently in a nursing home after hospital discharge on 09/14/2014. Underwent paracentesis yesterday and has had improved ability to eat. She denies any fever or chills. Pain is controlled. She's had no diarrhea, constipation, but does have lower 70 edema with tenderness. According to the patient, left pleural effusion is  dried up. Right pleural catheter is still in place with followup appointment scheduled with cardiothoracic surgery on 10/05/2014.  MEDICAL HISTORY: Past Medical History  Diagnosis Date  . GERD (gastroesophageal reflux disease)   . Osteoarthritis   . Peripheral edema   . CHF (congestive heart failure), NYHA class IV     diastolic  . Cancer     INTERIM HISTORY: has GERD; CONSTIPATION, CHRONIC; HEMATOCHEZIA; OSTEOARTHRITIS, LOWER LEG; KNEE PAIN; BURSITIS, KNEE; Dysphagia; Malignant pleural effusion; Edema of both legs; Heart failure, diastolic, with acute decompensation; Hyperkalemia; Anemia, unspecified; Gynecologic malignancy; Peritoneal carcinoma; Rectal bleed; Chest pain; Protein-calorie malnutrition, severe; Dysphagia, pharyngoesophageal phase; Constipation; Acute on chronic congestive heart failure; Acute on chronic respiratory failure with hypoxia; and Ovarian cancer on her problem list.    ALLERGIES:  is allergic to penicillins.  MEDICATIONS: has a current medication list which includes the following prescription(s): docusate sodium, hydrocodone-acetaminophen, lactulose, metoclopramide, ensure clear, pantoprazole, polyethylene glycol, dexamethasone, and potassium chloride.  SURGICAL HISTORY:  Past Surgical History  Procedure Laterality Date  . Cholecystectomy    . Cardiac catheterization    . Total abdominal hysterectomy  40 years ago    patient does not know if ovaries were removed  . Appendectomy    . Total knee arthroplasty Left   . Chest tube insertion Left 08/04/2014    Procedure: INSERTION PLEURAL DRAINAGE CATHETER;  Surgeon: Ivin Poot, MD;  Location: [facility];  Service: Thoracic;  Laterality: Left;  . Chest tube insertion Right 09/13/2014    Procedure: INSERTION PLEURAL DRAINAGE CATHETER;  Surgeon: Gaye Pollack, MD;  Location: [facility];  Service: Thoracic;  Laterality: Right;    FAMILY HISTORY: family history is not on file.  SOCIAL HISTORY:  reports that she  has never  smoked. She does not have any smokeless tobacco history on file. She reports that she does not drink alcohol or use illicit drugs.  REVIEW OF SYSTEMS:  Other than that discussed above is noncontributory.  PHYSICAL EXAMINATION: ECOG PERFORMANCE STATUS: 2 - Symptomatic, <50% confined to bed  Blood pressure 101/44, pulse 89, temperature 97.5 F (36.4 C), temperature source Oral, resp. rate 18, weight 131 lb 1.6 oz (59.467 kg), SpO2 100.00%.  GENERAL:alert, no distress and comfortable SKIN: skin color, texture, turgor are normal, no rashes or significant lesions EYES: PERLA; Conjunctiva are pink and non-injected, sclera clear SINUSES: No redness or tenderness over maxillary or ethmoid sinuses OROPHARYNX:no exudate, no erythema on lips, buccal mucosa, or tongue. NECK: supple, thyroid normal size, non-tender, without nodularity. No masses CHEST: Normal AP diameter with Pleuryx catheters in each chest. LYMPH:  no palpable lymphadenopathy in the cervical, axillary or inguinal LUNGS: clear to auscultation and percussion with normal breathing effort HEART: regular rate & rhythm and no murmurs. ABDOMEN:abdomen soft, non-tender and normal bowel sounds. Minimal fluid wave and shifting dullness. No rebound tenderness. No CVA tenderness.  MUSCULOSKELETAL:no cyanosis of digits and no clubbing. Range of motion normal.  NEURO: alert & oriented x 3 with fluent speech, no focal motor/sensory deficits. Increased sensitivity both lower extremities with possible edema.    LABORATORY DATA: Office Visit on 09/21/2014  Component Date Value Ref Range Status  . WBC 09/21/2014 5.0  4.0 - 10.5 K/uL Final  . RBC 09/21/2014 3.99  3.87 - 5.11 MIL/uL Final  . Hemoglobin 09/21/2014 10.2* 12.0 - 15.0 g/dL Final  . HCT 09/21/2014 32.6* 36.0 - 46.0 % Final  . MCV 09/21/2014 81.7  78.0 - 100.0 fL Final  . MCH 09/21/2014 25.6* 26.0 - 34.0 pg Final  . MCHC 09/21/2014 31.3  30.0 - 36.0 g/dL Final  . RDW 09/21/2014 17.8*  11.5 - 15.5 % Final  . Platelets 09/21/2014 365  150 - 400 K/uL Final  . Neutrophils Relative % 09/21/2014 64  43 - 77 % Final  . Neutro Abs 09/21/2014 3.2  1.7 - 7.7 K/uL Final  . Lymphocytes Relative 09/21/2014 26  12 - 46 % Final  . Lymphs Abs 09/21/2014 1.3  0.7 - 4.0 K/uL Final  . Monocytes Relative 09/21/2014 7  3 - 12 % Final  . Monocytes Absolute 09/21/2014 0.4  0.1 - 1.0 K/uL Final  . Eosinophils Relative 09/21/2014 2  0 - 5 % Final  . Eosinophils Absolute 09/21/2014 0.1  0.0 - 0.7 K/uL Final  . Basophils Relative 09/21/2014 1  0 - 1 % Final  . Basophils Absolute 09/21/2014 0.0  0.0 - 0.1 K/uL Final  . Blood Bank Specimen 09/21/2014 SAMPLE AVAILABLE FOR TESTING   Final  . Sample Expiration 09/21/2014 09/24/2014   Final  Admission on 09/08/2014, Discharged on 09/14/2014  Component Date Value Ref Range Status  . Troponin I 09/08/2014 <0.30  <0.30 ng/mL Final   Comment:                                 Due to the release kinetics of cTnI,                          a negative result within the first hours  of the onset of symptoms does not rule out                          myocardial infarction with certainty.                          If myocardial infarction is still suspected,                          repeat the test at appropriate intervals.  . WBC 09/08/2014 9.8  4.0 - 10.5 K/uL Final  . RBC 09/08/2014 4.51  3.87 - 5.11 MIL/uL Final  . Hemoglobin 09/08/2014 11.7* 12.0 - 15.0 g/dL Final  . HCT 09/08/2014 36.0  36.0 - 46.0 % Final  . MCV 09/08/2014 79.8  78.0 - 100.0 fL Final  . MCH 09/08/2014 25.9* 26.0 - 34.0 pg Final  . MCHC 09/08/2014 32.5  30.0 - 36.0 g/dL Final  . RDW 09/08/2014 16.9* 11.5 - 15.5 % Final  . Platelets 09/08/2014 377  150 - 400 K/uL Final  . Sodium 09/08/2014 134* 137 - 147 mEq/L Final  . Potassium 09/08/2014 4.8  3.7 - 5.3 mEq/L Final  . Chloride 09/08/2014 94* 96 - 112 mEq/L Final  . CO2 09/08/2014 28  19 - 32 mEq/L Final  .  Glucose, Bld 09/08/2014 110* 70 - 99 mg/dL Final  . BUN 09/08/2014 33* 6 - 23 mg/dL Final  . Creatinine, Ser 09/08/2014 0.99  0.50 - 1.10 mg/dL Final  . Calcium 09/08/2014 8.4  8.4 - 10.5 mg/dL Final  . GFR calc non Af Amer 09/08/2014 54* >90 mL/min Final  . GFR calc Af Amer 09/08/2014 62* >90 mL/min Final   Comment: (NOTE)                          The eGFR has been calculated using the CKD EPI equation.                          This calculation has not been validated in all clinical situations.                          eGFR's persistently <90 mL/min signify possible Chronic Kidney                          Disease.  . Anion gap 09/08/2014 12  5 - 15 Final  . Pro B Natriuretic peptide (BNP) 09/08/2014 1531.0* 0 - 450 pg/mL Final  . Sodium 09/09/2014 135* 137 - 147 mEq/L Final  . Potassium 09/09/2014 4.7  3.7 - 5.3 mEq/L Final  . Chloride 09/09/2014 96  96 - 112 mEq/L Final  . CO2 09/09/2014 30  19 - 32 mEq/L Final  . Glucose, Bld 09/09/2014 102* 70 - 99 mg/dL Final  . BUN 09/09/2014 31* 6 - 23 mg/dL Final  . Creatinine, Ser 09/09/2014 1.05  0.50 - 1.10 mg/dL Final  . Calcium 09/09/2014 8.2* 8.4 - 10.5 mg/dL Final  . GFR calc non Af Amer 09/09/2014 50* >90 mL/min Final  . GFR calc Af Amer 09/09/2014 58* >90 mL/min Final   Comment: (NOTE)  The eGFR has been calculated using the CKD EPI equation.                          This calculation has not been validated in all clinical situations.                          eGFR's persistently <90 mL/min signify possible Chronic Kidney                          Disease.  . Anion gap 09/09/2014 9  5 - 15 Final  . WBC 09/09/2014 7.6  4.0 - 10.5 K/uL Final  . RBC 09/09/2014 4.43  3.87 - 5.11 MIL/uL Final  . Hemoglobin 09/09/2014 11.4* 12.0 - 15.0 g/dL Final  . HCT 09/09/2014 35.7* 36.0 - 46.0 % Final  . MCV 09/09/2014 80.6  78.0 - 100.0 fL Final  . MCH 09/09/2014 25.7* 26.0 - 34.0 pg Final  . MCHC 09/09/2014 31.9  30.0 - 36.0  g/dL Final  . RDW 09/09/2014 16.9* 11.5 - 15.5 % Final  . Platelets 09/09/2014 331  150 - 400 K/uL Final  . MRSA by PCR 09/09/2014 NEGATIVE  NEGATIVE Final   Comment:                                 The GeneXpert MRSA Assay (FDA                          approved for NASAL specimens                          only), is one component of a                          comprehensive MRSA colonization                          surveillance program. It is not                          intended to diagnose MRSA                          infection nor to guide or                          monitor treatment for                          MRSA infections.  . WBC 09/11/2014 5.8  4.0 - 10.5 K/uL Final  . RBC 09/11/2014 4.08  3.87 - 5.11 MIL/uL Final  . Hemoglobin 09/11/2014 10.2* 12.0 - 15.0 g/dL Final  . HCT 09/11/2014 32.3* 36.0 - 46.0 % Final  . MCV 09/11/2014 79.2  78.0 - 100.0 fL Final  . MCH 09/11/2014 25.0* 26.0 - 34.0 pg Final  . MCHC 09/11/2014 31.6  30.0 - 36.0 g/dL Final  . RDW 09/11/2014 17.0* 11.5 - 15.5 % Final  . Platelets 09/11/2014 299  150 - 400 K/uL Final  . Sodium 09/11/2014 135* 137 - 147 mEq/L Final  .  Potassium 09/11/2014 3.8  3.7 - 5.3 mEq/L Final  . Chloride 09/11/2014 94* 96 - 112 mEq/L Final  . CO2 09/11/2014 27  19 - 32 mEq/L Final  . Glucose, Bld 09/11/2014 98  70 - 99 mg/dL Final  . BUN 09/11/2014 26* 6 - 23 mg/dL Final  . Creatinine, Ser 09/11/2014 0.95  0.50 - 1.10 mg/dL Final  . Calcium 09/11/2014 7.8* 8.4 - 10.5 mg/dL Final  . GFR calc non Af Amer 09/11/2014 56* >90 mL/min Final  . GFR calc Af Amer 09/11/2014 65* >90 mL/min Final   Comment: (NOTE)                          The eGFR has been calculated using the CKD EPI equation.                          This calculation has not been validated in all clinical situations.                          eGFR's persistently <90 mL/min signify possible Chronic Kidney                          Disease.  . Anion gap 09/11/2014 14  5  - 15 Final  . WBC 09/12/2014 6.4  4.0 - 10.5 K/uL Final  . RBC 09/12/2014 4.06  3.87 - 5.11 MIL/uL Final  . Hemoglobin 09/12/2014 10.3* 12.0 - 15.0 g/dL Final  . HCT 09/12/2014 32.0* 36.0 - 46.0 % Final  . MCV 09/12/2014 78.8  78.0 - 100.0 fL Final  . MCH 09/12/2014 25.4* 26.0 - 34.0 pg Final  . MCHC 09/12/2014 32.2  30.0 - 36.0 g/dL Final  . RDW 09/12/2014 16.8* 11.5 - 15.5 % Final  . Platelets 09/12/2014 277  150 - 400 K/uL Final  . Sodium 09/12/2014 134* 137 - 147 mEq/L Final  . Potassium 09/12/2014 3.8  3.7 - 5.3 mEq/L Final  . Chloride 09/12/2014 94* 96 - 112 mEq/L Final  . CO2 09/12/2014 29  19 - 32 mEq/L Final  . Glucose, Bld 09/12/2014 107* 70 - 99 mg/dL Final  . BUN 09/12/2014 23  6 - 23 mg/dL Final  . Creatinine, Ser 09/12/2014 0.86  0.50 - 1.10 mg/dL Final  . Calcium 09/12/2014 7.8* 8.4 - 10.5 mg/dL Final  . GFR calc non Af Amer 09/12/2014 64* >90 mL/min Final  . GFR calc Af Amer 09/12/2014 74* >90 mL/min Final   Comment: (NOTE)                          The eGFR has been calculated using the CKD EPI equation.                          This calculation has not been validated in all clinical situations.                          eGFR's persistently <90 mL/min signify possible Chronic Kidney                          Disease.  . Anion gap 09/12/2014 11  5 - 15 Final  . WBC 09/12/2014 6.7  4.0 - 10.5 K/uL  Final  . RBC 09/12/2014 4.23  3.87 - 5.11 MIL/uL Final  . Hemoglobin 09/12/2014 10.8* 12.0 - 15.0 g/dL Final  . HCT 09/12/2014 33.5* 36.0 - 46.0 % Final  . MCV 09/12/2014 79.2  78.0 - 100.0 fL Final  . MCH 09/12/2014 25.5* 26.0 - 34.0 pg Final  . MCHC 09/12/2014 32.2  30.0 - 36.0 g/dL Final  . RDW 09/12/2014 16.9* 11.5 - 15.5 % Final  . Platelets 09/12/2014 306  150 - 400 K/uL Final  . MRSA, PCR 09/12/2014 NEGATIVE  NEGATIVE Final  . Staphylococcus aureus 09/12/2014 NEGATIVE  NEGATIVE Final   Comment:                                 The Xpert SA Assay (FDA                           approved for NASAL specimens                          in patients over 43 years of age),                          is one component of                          a comprehensive surveillance                          program.  Test performance has                          been validated by [employer] for patients greater                          than or equal to 65 year old.                          It is not intended                          to diagnose infection nor to                          guide or monitor treatment.  . Sodium 09/13/2014 135* 137 - 147 mEq/L Final  . Potassium 09/13/2014 3.5* 3.7 - 5.3 mEq/L Final  . Chloride 09/13/2014 93* 96 - 112 mEq/L Final  . CO2 09/13/2014 28  19 - 32 mEq/L Final  . Glucose, Bld 09/13/2014 94  70 - 99 mg/dL Final  . BUN 09/13/2014 23  6 - 23 mg/dL Final  . Creatinine, Ser 09/13/2014 0.96  0.50 - 1.10 mg/dL Final  . Calcium 09/13/2014 7.9* 8.4 - 10.5 mg/dL Final  . GFR calc non Af Amer 09/13/2014 56* >90 mL/min Final  . GFR calc Af Amer 09/13/2014 64* >90 mL/min Final   Comment: (NOTE)  The eGFR has been calculated using the CKD EPI equation.                          This calculation has not been validated in all clinical situations.                          eGFR's persistently <90 mL/min signify possible Chronic Kidney                          Disease.  Georgiann Hahn gap 09/13/2014 14  5 - 15 Final  Hospital Outpatient Visit on 08/30/2014  Component Date Value Ref Range Status  . aPTT 08/30/2014 34  24 - 37 seconds Final  . WBC 08/30/2014 8.2  4.0 - 10.5 K/uL Final  . RBC 08/30/2014 4.57  3.87 - 5.11 MIL/uL Final  . Hemoglobin 08/30/2014 11.7* 12.0 - 15.0 g/dL Final  . HCT 08/30/2014 36.3  36.0 - 46.0 % Final  . MCV 08/30/2014 79.4  78.0 - 100.0 fL Final  . MCH 08/30/2014 25.6* 26.0 - 34.0 pg Final  . MCHC 08/30/2014 32.2  30.0 - 36.0 g/dL Final  . RDW 08/30/2014 16.8* 11.5 - 15.5 %  Final  . Platelets 08/30/2014 524* 150 - 400 K/uL Final  . Neutrophils Relative % 08/30/2014 65  43 - 77 % Final  . Neutro Abs 08/30/2014 5.3  1.7 - 7.7 K/uL Final  . Lymphocytes Relative 08/30/2014 25  12 - 46 % Final  . Lymphs Abs 08/30/2014 2.1  0.7 - 4.0 K/uL Final  . Monocytes Relative 08/30/2014 8  3 - 12 % Final  . Monocytes Absolute 08/30/2014 0.7  0.1 - 1.0 K/uL Final  . Eosinophils Relative 08/30/2014 1  0 - 5 % Final  . Eosinophils Absolute 08/30/2014 0.1  0.0 - 0.7 K/uL Final  . Basophils Relative 08/30/2014 1  0 - 1 % Final  . Basophils Absolute 08/30/2014 0.0  0.0 - 0.1 K/uL Final  . Prothrombin Time 08/30/2014 13.2  11.6 - 15.2 seconds Final  . INR 08/30/2014 1.00  0.00 - 1.49 Final  Office Visit on 08/23/2014  Component Date Value Ref Range Status  . WBC 08/23/2014 7.2  4.0 - 10.5 K/uL Final  . RBC 08/23/2014 4.32  3.87 - 5.11 MIL/uL Final  . Hemoglobin 08/23/2014 11.2* 12.0 - 15.0 g/dL Final  . HCT 08/23/2014 34.8* 36.0 - 46.0 % Final  . MCV 08/23/2014 80.6  78.0 - 100.0 fL Final  . MCH 08/23/2014 25.9* 26.0 - 34.0 pg Final  . MCHC 08/23/2014 32.2  30.0 - 36.0 g/dL Final  . RDW 08/23/2014 16.5* 11.5 - 15.5 % Final  . Platelets 08/23/2014 514* 150 - 400 K/uL Final  . Neutrophils Relative % 08/23/2014 69  43 - 77 % Final  . Neutro Abs 08/23/2014 5.0  1.7 - 7.7 K/uL Final  . Lymphocytes Relative 08/23/2014 21  12 - 46 % Final  . Lymphs Abs 08/23/2014 1.5  0.7 - 4.0 K/uL Final  . Monocytes Relative 08/23/2014 7  3 - 12 % Final  . Monocytes Absolute 08/23/2014 0.5  0.1 - 1.0 K/uL Final  . Eosinophils Relative 08/23/2014 2  0 - 5 % Final  . Eosinophils Absolute 08/23/2014 0.1  0.0 - 0.7 K/uL Final  . Basophils Relative 08/23/2014 1  0 - 1 % Final  . Basophils Absolute 08/23/2014 0.1  0.0 - 0.1 K/uL Final  . Sodium 08/23/2014 137  137 - 147 mEq/L Final  . Potassium 08/23/2014 3.7  3.7 - 5.3 mEq/L Final  . Chloride 08/23/2014 96  96 - 112 mEq/L Final  . CO2 08/23/2014  31  19 - 32 mEq/L Final  . Glucose, Bld 08/23/2014 116* 70 - 99 mg/dL Final  . BUN 08/23/2014 26* 6 - 23 mg/dL Final  . Creatinine, Ser 08/23/2014 0.81  0.50 - 1.10 mg/dL Final  . Calcium 08/23/2014 8.5  8.4 - 10.5 mg/dL Final  . Total Protein 08/23/2014 6.9  6.0 - 8.3 g/dL Final  . Albumin 08/23/2014 1.9* 3.5 - 5.2 g/dL Final  . AST 08/23/2014 15  0 - 37 U/L Final  . ALT 08/23/2014 9  0 - 35 U/L Final  . Alkaline Phosphatase 08/23/2014 89  39 - 117 U/L Final  . Total Bilirubin 08/23/2014 0.3  0.3 - 1.2 mg/dL Final  . GFR calc non Af Amer 08/23/2014 68* >90 mL/min Final  . GFR calc Af Amer 08/23/2014 79* >90 mL/min Final   Comment: (NOTE)                          The eGFR has been calculated using the CKD EPI equation.                          This calculation has not been validated in all clinical situations.                          eGFR's persistently <90 mL/min signify possible Chronic Kidney                          Disease.  . Anion gap 08/23/2014 10  5 - 15 Final  . CA 125 08/23/2014 SEE SEPARATE REPORT  0.0 - 30.2 U/mL Final    PATHOLOGY: adenocarcinoma, BRCA2 gene expressed in the tumor  Urinalysis No results found for this basename: colorurine,  appearanceur,  labspec,  phurine,  glucoseu,  hgbur,  bilirubinur,  ketonesur,  proteinur,  urobilinogen,  nitrite,  leukocytesur    RADIOGRAPHIC STUDIES: Dg Chest 1 View  09/19/2014   CLINICAL DATA:  Cough, chest pain for 1 week, history of CHF and effusions  EXAM: CHEST - 1 VIEW  COMPARISON:  09/13/2014  FINDINGS: A PleurX catheter is again over the lung bases bilaterally. Bilateral pleural effusions right greater than left are noted. A right-sided chest wall port is noted and stable. There is likely a atelectasis in both bases related to the adjacent effusions. No pneumothorax is noted.  IMPRESSION: Bilateral PleurX catheters in place.  Bilateral moderate effusions right greater than left.   Electronically Signed   By: Inez Catalina  M.D.   On: 09/19/2014 16:26   Dg Chest 1 View  09/09/2014   CLINICAL DATA:  RIGHT pleural effusion post thoracentesis  EXAM: CHEST - 1 VIEW  COMPARISON:  Earlier exam of 09/08/2014  FINDINGS: RIGHT jugular Port-A-Cath stable tip projecting over RIGHT atrium.  Enlargement of cardiac silhouette with pulmonary vascular congestion.  Bibasilar effusions and atelectasis greater on RIGHT.  RIGHT pleural effusion has decreased since the earlier study of 09/08/2014.  No pneumothorax.  Several skin folds project over lower RIGHT chest, confirmed on repeat image.  Atherosclerotic calcification at aortic arch.  Diffuse osseous demineralization.  IMPRESSION:  No pneumothorax following RIGHT thoracentesis.  Bibasilar effusions and atelectasis greater on RIGHT, with improved aeration at RIGHT base versus earlier study   Electronically Signed   By: Lavonia Dana M.D.   On: 09/09/2014 13:38   Dg Chest 2 View  09/12/2014   CLINICAL DATA:  Shortness of breath, weakness and recurrent right pleural effusion.  EXAM: CHEST  2 VIEW  COMPARISON:  09/09/2014  FINDINGS: There is a right jugular Port-A-Cath. Catheter tip in the lower SVC. There are persistent bilateral pleural effusions, right side greater the left. The right pleural effusion is moderate to large in size. Patient has a pleural drain at the left lung base. Position of this tube is unchanged. No evidence for pleural air. Evidence for compressive atelectasis bilaterally. Heart size is grossly stable but obscured by the pleural densities.  IMPRESSION: No significant change in the bilateral pleural effusions, right side greater the left.   Electronically Signed   By: Markus Daft M.D.   On: 09/12/2014 15:01   Dg Chest 2 View  09/07/2014   CLINICAL DATA:  Malignant pleural effusion.  EXAM: CHEST  2 VIEW  COMPARISON:  August 19, 2014.  FINDINGS: Interval placement of right internal jugular Port-A-Cath with distal tip in expected position of the SVC. Moderate left pleural  effusion is again noted and stable. Right pleural effusion is significantly enlarged compared to prior exam. No pneumothorax is noted.  IMPRESSION: Large right pleural effusion is noted which is significantly increased in size compared to prior exam. Stable mild left pleural effusion.   Electronically Signed   By: Sabino Dick M.D.   On: 09/07/2014 10:43   Ir Fluoro Guide Cv Line Right  08/30/2014   CLINICAL DATA:  79 year old female with peritoneal carcinomatosis. She has been referred for port catheter placement  EXAM: IR RIGHT FLOURO GUIDE CV LINE; IR ULTRASOUND GUIDANCE VASC ACCESS RIGHT  Date: 08/30/2014  ANESTHESIA/SEDATION: Moderate (conscious) sedation was administered during this procedure. A total of 1.0 mg Versed and 50 mg Fentanyl were administered intravenously. The patient's vital signs were monitored continuously by radiology nursing throughout the course of the procedure.  Total sedation time: 30 minutes  FLUOROSCOPY TIME:  12 seconds  TECHNIQUE: The risks and benefits of the procedure were discussed with the patient with full informed consent and the patient wishes to proceed.  The right neck and chest was prepped with chlorhexidine, and draped in the usual sterile fashion using maximum barrier technique (cap and mask, sterile gown, sterile gloves, large sterile sheet, hand hygiene and cutaneous antiseptic). Antibiotic prophylaxis was provided with 1.0 g vancomycin administered IV one hour prior to skin incision. Local anesthesia was attained by infiltration with 1% lidocaine without epinephrine.  Ultrasound demonstrated patency of the right internal jugular vein, and this was documented with an image. Under real-time ultrasound guidance, this vein was accessed with a 21 gauge micropuncture needle and image documentation was performed. A small dermatotomy was made at the access site with an 11 scalpel. A 0.018" wire was advanced into the SVC and the access needle exchanged for a 35F micropuncture  vascular sheath. The 0.018" wire was then removed and a 0.035" wire advanced into the IVC.  An appropriate location for the subcutaneous reservoir was selected below the clavicle and an incision was made through the skin and underlying soft tissues. The subcutaneous tissues were then dissected using a combination of blunt and sharp surgical technique and a pocket was formed. A single lumen power injectable portacatheter was  then tunneled through the subcutaneous tissues from the pocket to the dermatotomy and the port reservoir placed within the subcutaneous pocket.  The venous access site was then serially dilated and a peel away vascular sheath placed over the wire. The wire was removed and the port catheter advanced into position under fluoroscopic guidance. The catheter tip is positioned in the cavoatrial junction. This was documented with a spot image. The portacatheter was then tested and found to flush and aspirate well. The port was flushed with saline followed by 100 units/mL heparinized saline.  The pocket was then closed in two layers using first subdermal inverted interrupted absorbable sutures followed by a running subcuticular suture. The epidermis was then sealed with Dermabond. The dermatotomy at the venous access site was also sealed with Dermabond.  COMPLICATIONS: None.  The patient tolerated the procedure well.  IMPRESSION: Status post placement of right IJ port catheter with the tip at the cavoatrial junction. The catheter is ready for use.  Signed,  Dulcy Fanny. Earleen Newport, DO  [facility]  [facility]   Electronically Signed   By: Corrie Mckusick O.D.   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 She has been referred for port catheter placement  EXAM: IR RIGHT FLOURO GUIDE CV LINE; IR ULTRASOUND GUIDANCE VASC ACCESS RIGHT  Date: 08/30/2014  ANESTHESIA/SEDATION:  Moderate (conscious) sedation was administered during this procedure. A total of 1.0 mg Versed and 50 mg Fentanyl were administered intravenously. The patient's vital signs were monitored continuously by radiology nursing throughout the course of the procedure.  Total sedation time: 30 minutes  FLUOROSCOPY TIME:  12 seconds  TECHNIQUE: The risks and benefits of the procedure were discussed with the patient with full informed consent and the patient wishes to proceed.  The right neck and chest was prepped with chlorhexidine, and draped in the usual sterile fashion using maximum barrier technique (cap and mask, sterile gown, sterile gloves, large sterile sheet, hand hygiene and cutaneous antiseptic). Antibiotic prophylaxis was provided with 1.0 g vancomycin administered IV one hour prior to skin incision. Local anesthesia was attained by infiltration with 1% lidocaine without epinephrine.  Ultrasound demonstrated patency of the right internal jugular vein, and this was documented with an image. Under real-time ultrasound guidance, this vein was accessed with a 21 gauge micropuncture needle and image documentation was performed. A small dermatotomy was made at the access site with an 11 scalpel. A 0.018" wire was advanced into the SVC and the access needle exchanged for a 45F micropuncture vascular sheath. The 0.018" wire was then removed and a 0.035" wire advanced into the IVC.  An appropriate location for the subcutaneous reservoir was selected below the clavicle and an incision was made through the skin and underlying soft tissues. The subcutaneous tissues were then dissected using a combination of blunt and sharp surgical technique and a pocket was formed. A single lumen power injectable portacatheter was then tunneled through the subcutaneous tissues from the pocket to the dermatotomy and the port reservoir placed within the subcutaneous pocket.  The venous access site was then serially dilated and a peel away  vascular sheath placed over the wire. The wire was removed and the port catheter advanced into position under fluoroscopic guidance. The catheter tip is positioned in the cavoatrial junction. This was documented with a spot image. The portacatheter was then tested and found to flush and aspirate  well. The port was flushed with saline followed by 100 units/mL heparinized saline.  The pocket was then closed in two layers using first subdermal inverted interrupted absorbable sutures followed by a running subcuticular suture. The epidermis was then sealed with Dermabond. The dermatotomy at the venous access site was also sealed with Dermabond.  COMPLICATIONS: None.  The patient tolerated the procedure well.  IMPRESSION: Status post placement of right IJ port catheter with the tip at the cavoatrial junction. The catheter is ready for use.  Signed,  Dulcy Fanny. Earleen Newport, DO  [facility]  [facility]   Electronically Signed   By: Corrie Mckusick O.D.   On: 08/30/2014 14:39   Dg Chest 2v Repeat Same Day  09/07/2014   CLINICAL DATA:  Malignant right pleural effusion, right thoracentesis  EXAM: CHEST - 2 VIEW SAME DAY  COMPARISON:  Chest x-ray of 09/07/2014  FINDINGS: There are small bilateral pleural effusions present with significant reduction in the volume of the right pleural effusion after right thoracentesis. No pneumothorax is seen. Heart size is stable. The bones are somewhat osteopenic.  IMPRESSION: Significant decrease in volume of the right pleural effusion after right thoracentesis. No pneumothorax.   Electronically Signed   By: Ivar Drape M.D.   On: 09/07/2014 13:17   Dg Chest Port 1 View  09/13/2014   CLINICAL DATA:  PleurX catheter placement on right.  EXAM: PORTABLE CHEST - 1 VIEW  COMPARISON:  September 12, 2014  FINDINGS: PleurX type catheter placed at right base. Port-A-Cath tip at cavoatrial junction. There is a chest tube on the left, stable in position. There  is a small pneumothorax at the lateral right base, slightly superior to the PleurX catheter.  There remains moderate effusion on the right, smaller than compared to prior chest tube placement. There is a small effusion on the left as well, stable. There is consolidation in medial bases, stable. Heart is mildly enlarged with pulmonary vascularity, stable. No adenopathy.  IMPRESSION: New PleurX type catheter placed at right base. Effusion smaller but nonetheless moderate on the right. Suspect small lateral right pneumothorax without tension component. Stable chest tube on the left. Stable effusion on the left. Areas of consolidation atelectasis remain stable in both lower lung zone regions. No change in cardiac silhouette.   Electronically Signed   By: Lowella Grip M.D.   On: 09/13/2014 14:39   Dg Chest Port 1 View  09/08/2014   CLINICAL DATA:  Intermittent central chest pain and shortness of breath. Right-sided thoracentesis yesterday. Initial presentation.  EXAM: PORTABLE CHEST - 1 VIEW  COMPARISON:  Two-view chest x-ray 09/07/2014.  FINDINGS: The heart borders are obscured. The right pleural effusion has re-accumulated, opacifying 2/3 the right hemi thorax. A small moderate left pleural effusion is present as well. Mild diffuse edema is present.  IMPRESSION: 1. Re-accumulation of right pleural effusion similar or increased to the level prior to the thoracentesis. 2. Small left pleural effusion is stable. 3. Increased edema.   Electronically Signed   By: Lawrence Santiago M.D.   On: 09/08/2014 20:08   US Thoracentesis Asp Pleural Space W/img Guide  09/09/2014   CLINICAL DATA:  Recurrent RIGHT pleural effusion  EXAM: ULTRASOUND GUIDED THERAPEUTIC RIGHT THORACENTESIS  COMPARISON:  None.  PROCEDURE: Procedure, benefits, and risks of procedure were discussed with patient.  Written informed consent for procedure was obtained.  Time out protocol followed.  Pleural effusion localized by ultrasound at the posterior  RIGHT hemithorax.  Skin  prepped and draped in usual sterile fashion.  Skin and soft tissues anesthetized with 5 mL of 1% lidocaine.  8 French thoracentesis catheter placed into the RIGHT pleural space.  1600 mL of yellow fluid aspirated by syringe pump.  Procedure tolerated very well by patient without immediate complication.  FINDINGS: A total of approximately 1600 mL of yellow RIGHT pleural fluid was removed. A fluid sample of 180 mL wassent for laboratory analysis.  IMPRESSION: Successful ultrasound guided RIGHT thoracentesis yielding 1600 mL of pleural fluid.   Electronically Signed   By: Lavonia Dana M.D.   On: 09/09/2014 13:48    ASSESSMENT:  #1. 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  #2.History of adenomatous polyps in November of 2010.  #3. Status post hysterectomy and bilateral salpingo-oophorectomy.  #4. Status post cholecystectomy       PLAN:  #1. Patient is already on Reglan. She is oriented influenza virus vaccine. #2. Carboplatin/Taxol weekly for 3 weeks on, one week off to begin tomorrow. Careful monitoring for toxicity. If intolerant, switch to Olaparib or hospice. #3. Followup 09/29/2014 with CBC and BMP. Baseline CA 125 was done today along with CBC and chem profile.   All questions were answered. The patient knows to call the clinic with any problems, questions or concerns. We can certainly see the patient much sooner if necessary.   I spent 30 minutes counseling the patient face to face. The total time spent in the appointment was 40 minutes.    Doroteo Bradford, MD 09/21/2014 3:30 PM  DISCLAIMER:  This note was dictated with voice recognition software.  Similar sounding words can inadvertently be transcribed inaccurately and may not be  corrected upon review.

## 2014-09-21 NOTE — Patient Instructions (Addendum)
..  [place] Discharge Instructions  RECOMMENDATIONS MADE BY THE CONSULTANT AND ANY TEST RESULTS WILL BE SENT TO YOUR REFERRING PHYSICIAN.  EXAM FINDINGS BY THE PHYSICIAN TODAY AND SIGNS OR SYMPTOMS TO REPORT TO CLINIC OR PRIMARY PHYSICIAN: Exam and findings as discussed by Dr. Barnet Glasgow. Chemo to start tomorrow. Be here at 8;45 am We will review the side-effects of chemo with you again while you are here tomorrow INSTRUCTIONS/FOLLOW-UP: See Dr. Barnet Glasgow in one week  Thank you for choosing [facility] to provide your oncology and hematology care.  To afford each patient quality time with our providers, please arrive at least 15 minutes before your scheduled appointment time.  With your help, our goal is to use those 15 minutes to complete the necessary work-up to ensure our physicians have the information they need to help with your evaluation and healthcare recommendations.    Effective January 1st, 2014, we ask that you re-schedule your appointment with our physicians should you arrive 10 or more minutes late for your appointment.  We strive to give you quality time with our providers, and arriving late affects you and other patients whose appointments are after yours.    Again, thank you for choosing [facility].  Our hope is that these requests will decrease the amount of time that you wait before being seen by our physicians.       _____________________________________________________________  Should you have questions after your visit to [facility], please contact our office at [phone] between the hours of 8:30 a.m. and 4:30 p.m.  Voicemails left after 4:30 p.m. will not be returned until the following business day.  For prescription refill requests, have your pharmacy contact our office with your prescription refill request.    _______________________________________________________________  We hope that we have given  you very good care.  You may receive a patient satisfaction survey in the mail, please complete it and return it as soon as possible.  We value your feedback!  _______________________________________________________________  Have you asked about our STAR program?  STAR stands for Survivorship Training and Rehabilitation, and this is a nationally recognized cancer care program that focuses on survivorship and rehabilitation.  Cancer and cancer treatments may cause problems, such as, pain, making you feel tired and keeping you from doing the things that you need or want to do. Cancer rehabilitation can help. Our goal is to reduce these troubling effects and help you have the best quality of life possible.  You may receive a survey from a nurse that asks questions about your current state of health.  Based on the survey results, all eligible patients will be referred to the [facility] program for an evaluation so we can better serve you!  A frequently asked questions sheet is available upon request.

## 2014-09-21 NOTE — Progress Notes (Signed)
[name] presented for labwork. Labs per MD order drawn via Peripheral Line 25 gauge needle inserted in lt ac.  Good blood return present. Procedure without incident.  Needle removed intact. Patient tolerated procedure well.

## 2014-09-22 ENCOUNTER — Encounter (HOSPITAL_BASED_OUTPATIENT_CLINIC_OR_DEPARTMENT_OTHER): Payer: Medicare HMO

## 2014-09-22 ENCOUNTER — Encounter (HOSPITAL_COMMUNITY): Payer: Self-pay

## 2014-09-22 VITALS — BP 96/55 | HR 86 | Temp 97.6°F | Resp 20

## 2014-09-22 DIAGNOSIS — C569 Malignant neoplasm of unspecified ovary: Secondary | ICD-10-CM

## 2014-09-22 DIAGNOSIS — C482 Malignant neoplasm of peritoneum, unspecified: Secondary | ICD-10-CM

## 2014-09-22 DIAGNOSIS — Z5111 Encounter for antineoplastic chemotherapy: Secondary | ICD-10-CM

## 2014-09-22 DIAGNOSIS — J910 Malignant pleural effusion: Secondary | ICD-10-CM

## 2014-09-22 LAB — CA 125: CA 125: 3869 U/mL — AB (ref <35)

## 2014-09-22 MED ORDER — PACLITAXEL CHEMO INJECTION 300 MG/50ML
60.0000 mg/m2 | Freq: Once | INTRAVENOUS | Status: AC
  Administered 2014-09-22: 102 mg via INTRAVENOUS
  Filled 2014-09-22: qty 17

## 2014-09-22 MED ORDER — SODIUM CHLORIDE 0.9 % IV SOLN
Freq: Once | INTRAVENOUS | Status: AC
  Administered 2014-09-22: 10:00:00 via INTRAVENOUS

## 2014-09-22 MED ORDER — SODIUM CHLORIDE 0.9 % IJ SOLN
10.0000 mL | INTRAMUSCULAR | Status: DC | PRN
  Administered 2014-09-22: 10 mL

## 2014-09-22 MED ORDER — SODIUM CHLORIDE 0.9 % IV SOLN
Freq: Once | INTRAVENOUS | Status: AC
  Administered 2014-09-22: 10:00:00 via INTRAVENOUS
  Filled 2014-09-22: qty 5

## 2014-09-22 MED ORDER — OXYCODONE-ACETAMINOPHEN 5-325 MG PO TABS
1.0000 | ORAL_TABLET | Freq: Once | ORAL | Status: AC
  Administered 2014-09-22: 1 via ORAL

## 2014-09-22 MED ORDER — PALONOSETRON HCL INJECTION 0.25 MG/5ML
0.2500 mg | Freq: Once | INTRAVENOUS | Status: AC
  Administered 2014-09-22: 0.25 mg via INTRAVENOUS
  Filled 2014-09-22: qty 5

## 2014-09-22 MED ORDER — DEXAMETHASONE SODIUM PHOSPHATE 10 MG/ML IJ SOLN: 20.0000 mg | Freq: Once | INTRAMUSCULAR | Status: DC

## 2014-09-22 MED ORDER — DIPHENHYDRAMINE HCL 50 MG/ML IJ SOLN
50.0000 mg | Freq: Once | INTRAMUSCULAR | Status: AC
  Administered 2014-09-22: 50 mg via INTRAVENOUS
  Filled 2014-09-22: qty 1

## 2014-09-22 MED ORDER — OXYCODONE-ACETAMINOPHEN 5-325 MG PO TABS
ORAL_TABLET | ORAL | Status: AC
  Filled 2014-09-22: qty 1

## 2014-09-22 MED ORDER — SODIUM CHLORIDE 0.9 % IV SOLN
145.0000 mg | Freq: Once | INTRAVENOUS | Status: AC
  Administered 2014-09-22: 150 mg via INTRAVENOUS
  Filled 2014-09-22: qty 15

## 2014-09-22 MED ORDER — FAMOTIDINE IN NACL 20-0.9 MG/50ML-% IV SOLN
20.0000 mg | Freq: Once | INTRAVENOUS | Status: AC
  Administered 2014-09-22: 20 mg via INTRAVENOUS
  Filled 2014-09-22: qty 50

## 2014-09-22 MED ORDER — HEPARIN SOD (PORK) LOCK FLUSH 100 UNIT/ML IV SOLN
500.0000 [IU] | Freq: Once | INTRAVENOUS | Status: AC | PRN
  Administered 2014-09-22: 500 [IU]
  Filled 2014-09-22: qty 5

## 2014-09-22 MED ORDER — SODIUM CHLORIDE 0.9 % IV SOLN: 150.0000 mg | Freq: Once | INTRAVENOUS | Status: DC

## 2014-09-22 NOTE — Patient Instructions (Signed)
[facility] Discharge Instructions for Patients Receiving Chemotherapy  Today you received the following chemotherapy agents taxol/carboplatin Please call the clinic if you have any questions or concerns  To help prevent nausea and vomiting after your treatment, we encourage you to take your nausea medication    If you develop nausea and vomiting that is not controlled by your nausea medication, call the clinic.   BELOW ARE SYMPTOMS THAT SHOULD BE REPORTED IMMEDIATELY:  *FEVER GREATER THAN 100.5 F  *CHILLS WITH OR WITHOUT FEVER  NAUSEA AND VOMITING THAT IS NOT CONTROLLED WITH YOUR NAUSEA MEDICATION  *UNUSUAL SHORTNESS OF BREATH  *UNUSUAL BRUISING OR BLEEDING  TENDERNESS IN MOUTH AND THROAT WITH OR WITHOUT PRESENCE OF ULCERS  *URINARY PROBLEMS  *BOWEL PROBLEMS  UNUSUAL RASH Items with * indicate a potential emergency and should be followed up as soon as possible.  Feel free to call the clinic you have any questions or concerns. The clinic phone number is [phone].  Paclitaxel injection What is this medicine? PACLITAXEL (PAK li TAX el) is a chemotherapy drug. It targets fast dividing cells, like cancer cells, and causes these cells to die. This medicine is used to treat ovarian cancer, breast cancer, and other cancers. This medicine may be used for other purposes; ask your health care provider or pharmacist if you have questions. COMMON BRAND NAME(S): Onxol, Taxol What should I tell my health care provider before I take this medicine? They need to know if you have any of these conditions: -blood disorders -irregular heartbeat -infection (especially a virus infection such as chickenpox, cold sores, or herpes) -liver disease -previous or ongoing radiation therapy -an unusual or allergic reaction to paclitaxel, alcohol, polyoxyethylated castor oil, other chemotherapy agents, other medicines, foods, dyes, or preservatives -pregnant or trying to get  pregnant -breast-feeding How should I use this medicine? This drug is given as an infusion into a vein. It is administered in a hospital or clinic by a specially trained health care professional. Talk to your pediatrician regarding the use of this medicine in children. Special care may be needed. Overdosage: If you think you have taken too much of this medicine contact a poison control center or emergency room at once. NOTE: This medicine is only for you. Do not share this medicine with others. What if I miss a dose? It is important not to miss your dose. Call your doctor or health care professional if you are unable to keep an appointment. What may interact with this medicine? Do not take this medicine with any of the following medications: -disulfiram -metronidazole This medicine may also interact with the following medications: -cyclosporine -diazepam -ketoconazole -medicines to increase blood counts like filgrastim, pegfilgrastim, sargramostim -other chemotherapy drugs like cisplatin, doxorubicin, epirubicin, etoposide, teniposide, vincristine -quinidine -testosterone -vaccines -verapamil Talk to your doctor or health care professional before taking any of these medicines: -acetaminophen -aspirin -ibuprofen -ketoprofen -naproxen This list may not describe all possible interactions. Give your health care provider a list of all the medicines, herbs, non-prescription drugs, or dietary supplements you use. Also tell them if you smoke, drink alcohol, or use illegal drugs. Some items may interact with your medicine. What should I watch for while using this medicine? Your condition will be monitored carefully while you are receiving this medicine. You will need important blood work done while you are taking this medicine. This drug may make you feel generally unwell. This is not uncommon, as chemotherapy can affect healthy cells as well as cancer cells.  Report any side effects. Continue  your course of treatment even though you feel ill unless your doctor tells you to stop. In some cases, you may be given additional medicines to help with side effects. Follow all directions for their use. Call your doctor or health care professional for advice if you get a fever, chills or sore throat, or other symptoms of a cold or flu. Do not treat yourself. This drug decreases your body's ability to fight infections. Try to avoid being around people who are sick. This medicine may increase your risk to bruise or bleed. Call your doctor or health care professional if you notice any unusual bleeding. Be careful brushing and flossing your teeth or using a toothpick because you may get an infection or bleed more easily. If you have any dental work done, tell your dentist you are receiving this medicine. Avoid taking products that contain aspirin, acetaminophen, ibuprofen, naproxen, or ketoprofen unless instructed by your doctor. These medicines may hide a fever. Do not become pregnant while taking this medicine. Women should inform their doctor if they wish to become pregnant or think they might be pregnant. There is a potential for serious side effects to an unborn child. Talk to your health care professional or pharmacist for more information. Do not breast-feed an infant while taking this medicine. Men are advised not to father a child while receiving this medicine. What side effects may I notice from receiving this medicine? Side effects that you should report to your doctor or health care professional as soon as possible: -allergic reactions like skin rash, itching or hives, swelling of the face, lips, or tongue -low blood counts - This drug may decrease the number of white blood cells, red blood cells and platelets. You may be at increased risk for infections and bleeding. -signs of infection - fever or chills, cough, sore throat, pain or difficulty passing urine -signs of decreased platelets or  bleeding - bruising, pinpoint red spots on the skin, black, tarry stools, nosebleeds -signs of decreased red blood cells - unusually weak or tired, fainting spells, lightheadedness -breathing problems -chest pain -high or low blood pressure -mouth sores -nausea and vomiting -pain, swelling, redness or irritation at the injection site -pain, tingling, numbness in the hands or feet -slow or irregular heartbeat -swelling of the ankle, feet, hands Side effects that usually do not require medical attention (report to your doctor or health care professional if they continue or are bothersome): -bone pain -complete hair loss including hair on your head, underarms, pubic hair, eyebrows, and eyelashes -changes in the color of fingernails -diarrhea -loosening of the fingernails -loss of appetite -muscle or joint pain -red flush to skin -sweating This list may not describe all possible side effects. Call your doctor for medical advice about side effects. You may report side effects to FDA at 1-800-FDA-1088. Where should I keep my medicine? This drug is given in a hospital or clinic and will not be stored at home. NOTE: This sheet is a summary. It may not cover all possible information. If you have questions about this medicine, talk to your doctor, pharmacist, or health care provider.  2015, Elsevier/Gold Standard. (2013-01-11 16:41:21)     Carboplatin injection What is this medicine? CARBOPLATIN (KAR boe pla tin) is a chemotherapy drug. It targets fast dividing cells, like cancer cells, and causes these cells to die. This medicine is used to treat ovarian cancer and many other cancers. This medicine may be used for other purposes; ask  your health care provider or pharmacist if you have questions. COMMON BRAND NAME(S): Paraplatin What should I tell my health care provider before I take this medicine? They need to know if you have any of these conditions: -blood disorders -hearing  problems -kidney disease -recent or ongoing radiation therapy -an unusual or allergic reaction to carboplatin, cisplatin, other chemotherapy, other medicines, foods, dyes, or preservatives -pregnant or trying to get pregnant -breast-feeding How should I use this medicine? This drug is usually given as an infusion into a vein. It is administered in a hospital or clinic by a specially trained health care professional. Talk to your pediatrician regarding the use of this medicine in children. Special care may be needed. Overdosage: If you think you have taken too much of this medicine contact a poison control center or emergency room at once. NOTE: This medicine is only for you. Do not share this medicine with others. What if I miss a dose? It is important not to miss a dose. Call your doctor or health care professional if you are unable to keep an appointment. What may interact with this medicine? -medicines for seizures -medicines to increase blood counts like filgrastim, pegfilgrastim, sargramostim -some antibiotics like amikacin, gentamicin, neomycin, streptomycin, tobramycin -vaccines Talk to your doctor or health care professional before taking any of these medicines: -acetaminophen -aspirin -ibuprofen -ketoprofen -naproxen This list may not describe all possible interactions. Give your health care provider a list of all the medicines, herbs, non-prescription drugs, or dietary supplements you use. Also tell them if you smoke, drink alcohol, or use illegal drugs. Some items may interact with your medicine. What should I watch for while using this medicine? Your condition will be monitored carefully while you are receiving this medicine. You will need important blood work done while you are taking this medicine. This drug may make you feel generally unwell. This is not uncommon, as chemotherapy can affect healthy cells as well as cancer cells. Report any side effects. Continue your course  of treatment even though you feel ill unless your doctor tells you to stop. In some cases, you may be given additional medicines to help with side effects. Follow all directions for their use. Call your doctor or health care professional for advice if you get a fever, chills or sore throat, or other symptoms of a cold or flu. Do not treat yourself. This drug decreases your body's ability to fight infections. Try to avoid being around people who are sick. This medicine may increase your risk to bruise or bleed. Call your doctor or health care professional if you notice any unusual bleeding. Be careful brushing and flossing your teeth or using a toothpick because you may get an infection or bleed more easily. If you have any dental work done, tell your dentist you are receiving this medicine. Avoid taking products that contain aspirin, acetaminophen, ibuprofen, naproxen, or ketoprofen unless instructed by your doctor. These medicines may hide a fever. Do not become pregnant while taking this medicine. Women should inform their doctor if they wish to become pregnant or think they might be pregnant. There is a potential for serious side effects to an unborn child. Talk to your health care professional or pharmacist for more information. Do not breast-feed an infant while taking this medicine. What side effects may I notice from receiving this medicine? Side effects that you should report to your doctor or health care professional as soon as possible: -allergic reactions like skin rash, itching or hives, swelling  of the face, lips, or tongue -signs of infection - fever or chills, cough, sore throat, pain or difficulty passing urine -signs of decreased platelets or bleeding - bruising, pinpoint red spots on the skin, black, tarry stools, nosebleeds -signs of decreased red blood cells - unusually weak or tired, fainting spells, lightheadedness -breathing problems -changes in hearing -changes in  vision -chest pain -high blood pressure -low blood counts - This drug may decrease the number of white blood cells, red blood cells and platelets. You may be at increased risk for infections and bleeding. -nausea and vomiting -pain, swelling, redness or irritation at the injection site -pain, tingling, numbness in the hands or feet -problems with balance, talking, walking -trouble passing urine or change in the amount of urine Side effects that usually do not require medical attention (report to your doctor or health care professional if they continue or are bothersome): -hair loss -loss of appetite -metallic taste in the mouth or changes in taste This list may not describe all possible side effects. Call your doctor for medical advice about side effects. You may report side effects to FDA at 1-800-FDA-1088. Where should I keep my medicine? This drug is given in a hospital or clinic and will not be stored at home. NOTE: This sheet is a summary. It may not cover all possible information. If you have questions about this medicine, talk to your doctor, pharmacist, or health care provider.  2015, Elsevier/Gold Standard. (2008-02-23 14:38:05)

## 2014-09-22 NOTE — Progress Notes (Signed)
Consent signed for Carboplatin & Taxol this am. Distress screening done.

## 2014-09-23 NOTE — Progress Notes (Signed)
Went to [facility] to check on [name] No complaints of N/V/D.  States she did well.

## 2014-09-26 ENCOUNTER — Ambulatory Visit (HOSPITAL_COMMUNITY): Payer: Medicare HMO

## 2014-09-26 ENCOUNTER — Inpatient Hospital Stay (HOSPITAL_COMMUNITY): Payer: Medicare HMO

## 2014-09-27 NOTE — Progress Notes (Signed)
Sarah Bogus, MD [address] [place] [address]  Ovarian cancer, unspecified laterality  Peritoneal carcinoma - Plan: CBC with Differential, Comprehensive metabolic panel  CURRENT THERAPY: Neoadjuvant Carboplatin/Paclitaxel  INTERVAL HISTORY: [name] 79 y.o. 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      Ovarian cancer   07/25/2014 Pathology Results Left pleural effusion positive for malignant cell consistent with adenocarcinoma.   07/27/2014 - 08/05/2014 Hospital Admission Heart failure with malignant pleural effusion   07/28/2014 Pathology Results Left pleural effusion positive for malignant cells consistent with adenocarcinoma (CK7 pos, CK20 neg, WT-1 pos, ER weakly pos, PR neg, TTF-1 neg, GCDFP-15 neg).  Findings favor gyn/ovarian primary.   08/01/2014 Pathology Results [facility] genomic findings: BRCA2 [id number], STK11 loss, MYC amplification-equivocal, TP53 R273C, MLL2 [id number].  Targeted treatments include Olaparib (BRCA2) and Everolimus/Temsirolimus (STK11).   08/04/2014 Procedure Left pleurX catheter by Dr. VLucianne LeiTright   09/08/2014 - 09/14/2014 Hospital Admission Hospitalized with the following main issues: acute on chronic CHF, acute on chronic respiratory failure with hypoxia, and malignant plueral effusion on right    09/13/2014 Procedure Right PleurX catheter by Dr. BCyndia Bent  09/13/2014 Pathology Results Right pleural effusion positive for malignant adenocarcinoma   09/22/2014 -  Chemotherapy Carboplatin/Paclitaxel days 1, 8, 15 every 28 days.   I personally reviewed and went over laboratory results with the patient.  The results are noted within this dictation.  Hgb is low and therefore the patient would benefit from ESA therapy.  I discussed the risks, benefits, alternatives (blood transfusions),  and side effects of this intervention including the risk of HTN and progression of malignancy.  Consent ascertained.   She notes SOB which may be secondary to her Hgb being low, in addition to her malignant pleural effusion, malignancy, treatment, deconditioning, etc.  She was treated in the clinic with 0.5 mg of Ativan for irritability secondary to SOB.  Otherwise, the patient denies any complaints.  [facility]  I did not learn this until the patient left the clinic and therefore I did not note it on physical examination.  I discussed code status with the patient and she reports that she has a living will and advanced directive completed.  [facility] therefore, I recommended a copy of that be brought to u[place]oreafor documentation purposes.    During conversation, the patient goes through time of lucency and confusion.  She is more lucid than confused in my opinion.  She seemed to understand ESA therapy and the risks and benefits and side effects of this intervention.     Her daughter was present in the room and she comprehended everything that was said.  All questions were answered to the patient's and family member's satisfaction.  Oncologically, she denies any complaints and ROS questioning is otherwise negative.  Past Medical History  Diagnosis Date  . GERD (gastroesophageal reflux disease)   . Osteoarthritis   . Peripheral edema   . CHF (congestive heart failure), NYHA class IV     diastolic  . Cancer     has GERD; CONSTIPATION, CHRONIC; HEMATOCHEZIA; OSTEOARTHRITIS, LOWER LEG; KNEE PAIN; BURSITIS, KNEE; Dysphagia; Malignant pleural effusion; Edema of both legs; Heart failure, diastolic, with acute decompensation; Hyperkalemia; Anemia, unspecified; Peritoneal carcinoma; Rectal  bleed; Chest pain; Protein-calorie malnutrition, severe; Dysphagia, pharyngoesophageal phase;  Constipation; Acute on chronic congestive heart failure; Acute on chronic respiratory failure with hypoxia; Ovarian cancer; and Anemia due to chemotherapy on her problem list.     is allergic to penicillins.  Ms. Zuver had no medications administered during this visit.  Past Surgical History  Procedure Laterality Date  . Cholecystectomy    . Cardiac catheterization    . Total abdominal hysterectomy  40 years ago    patient does not know if ovaries were removed  . Appendectomy    . Total knee arthroplasty Left   . Chest tube insertion Left 08/04/2014    Procedure: INSERTION PLEURAL DRAINAGE CATHETER;  Surgeon: Ivin Poot, MD;  Location: [place];  Service: Thoracic;  Laterality: Left;  . Chest tube insertion Right 09/13/2014    Procedure: INSERTION PLEURAL DRAINAGE CATHETER;  Surgeon: Gaye Pollack, MD;  Location: [facility];  Service: Thoracic;  Laterality: Right;    Denies any headaches, dizziness, double vision, fevers, chills, night sweats, nausea, vomiting, diarrhea, constipation, chest pain, heart palpitations, shortness of breath, blood in stool, black tarry stool, urinary pain, urinary burning, urinary frequency, hematuria.   PHYSICAL EXAMINATION  ECOG PERFORMANCE STATUS: 2 - Symptomatic, <50% confined to bed  Filed Vitals:   09/29/14 0900  BP: 110/63  Pulse: 98  Temp: 97.5 F (36.4 C)  Resp: 20    GENERAL:alert, no distress, well nourished, well developed, comfortable, cooperative and smiling SKIN: skin color, texture, turgor are normal, no rashes or significant lesions HEAD: Normocephalic, No masses, lesions, tenderness or abnormalities EYES: normal, PERRLA, EOMI, Conjunctiva are pink and non-injected EARS: External ears normal OROPHARYNX:mucous membranes are moist  NECK: supple, trachea midline LYMPH:  not examined BREAST:not examined LUNGS: decreased breath sounds in right middle and lower lobe and left lower lobe.  Upper lobes bilaterally demonstrate good air  movement. HEART: regular rate & rhythm ABDOMEN:normal bowel sounds BACK: Back symmetric, no curvature. EXTREMITIES:less then 2 second capillary refill, no cyanosis  NEURO: no focal motor/sensory deficits, times of lucency and times of confusion.   LABORATORY DATA: CBC    Component Value Date/Time   WBC 3.7* 09/29/2014 0900   RBC 3.18* 09/29/2014 0900   RBC 4.29 07/27/2014 2257   HGB 8.2* 09/29/2014 0900   HCT 26.2* 09/29/2014 0900   PLT 256 09/29/2014 0900   MCV 82.4 09/29/2014 0900   MCH 25.8* 09/29/2014 0900   MCHC 31.3 09/29/2014 0900   RDW 18.2* 09/29/2014 0900   LYMPHSABS 1.0 09/29/2014 0900   MONOABS 0.2 09/29/2014 0900   EOSABS 0.1 09/29/2014 0900   BASOSABS 0.0 09/29/2014 0900      Chemistry      Component Value Date/Time   NA 140 09/29/2014 0900   K 3.9 09/29/2014 0900   CL 108 09/29/2014 0900   CO2 22 09/29/2014 0900   BUN 14 09/29/2014 0900   CREATININE 0.52 09/29/2014 0900      Component Value Date/Time   CALCIUM 8.1* 09/29/2014 0900   ALKPHOS 61 09/29/2014 0900   AST 15 09/29/2014 0900   ALT 8 09/29/2014 0900   BILITOT 0.2* 09/29/2014 0900     Lab Results  Component Value Date   CA125 3869* 09/21/2014      ASSESSMENT:  1. Primary peritoneal carcinomatosis with malignant left and right pleural effusion, status post B/L PleurX catheter insertion. BRCA2 is positive on tumor cells present in the pleural fluid making the patient a candidate for Olaparib as  second line treatment.  Began Carboplatin/Paclitaxel on 09/22/2014. 2.History of adenomatous polyps in November of 2010.  3. Status post hysterectomy and bilateral salpingo-oophorectomy.  4. Status post cholecystectomy  5. SOB, multifactorial, improved with Ativan 0.5 mg  6. Chemotherapy-induced anemia  Patient Active Problem List   Diagnosis Date Noted  . Anemia due to chemotherapy 09/29/2014  . Ovarian cancer 09/09/2014  . Acute on chronic congestive heart failure 09/08/2014  . Acute on  chronic respiratory failure with hypoxia 09/08/2014  . Constipation 09/03/2014  . Protein-calorie malnutrition, severe 08/15/2014  . Dysphagia, pharyngoesophageal phase 08/15/2014  . Rectal bleed 08/14/2014  . Chest pain 08/14/2014  . Peritoneal carcinoma 08/13/2014  . Hyperkalemia 08/03/2014  . Anemia, unspecified 08/03/2014  . Heart failure, diastolic, with acute decompensation 07/30/2014  . Malignant pleural effusion 07/27/2014  . Edema of both legs 07/27/2014  . GERD 10/11/2009  . CONSTIPATION, CHRONIC 10/11/2009  . HEMATOCHEZIA 10/11/2009  . Dysphagia 10/11/2009  . OSTEOARTHRITIS, LOWER LEG 11/19/2007  . KNEE PAIN 11/19/2007  . BURSITIS, KNEE 11/19/2007     PLAN:  1. I personally reviewed and went over laboratory results with the patient.  The results are noted within this dictation. 2. Pre-chemo labs as ordered 3. Day 8 of cycle 1 today as planned. 4. Discussion regarding risks, benefits, alternatives (nothing or blood transfusions), and side effects including HTN and progression of malignancy. 5. Consent ascertained for Aranesp 300 mcg every 2 weeks. 6. Aranesp 300 mcg every 2 weeks supportive therapy plan built 7. Patient requests deferring her next Aranesp injection in 2 weeks as this is her week off. 8. Code status discuss; patient has advanced directive and living will completed, I do not know the outcomes of that and I requested a copy for documentation sake. 9. Recommend PleurX catheter draining daily - every 2 days. 10. Return for follow-up of day 1 cycle 2.   THERAPY PLAN:  The plan is to receive 3-4 cycles of chemotherapy in a neoadjuvant setting with interval debulking surgery dependant upon response to first-line therapy.  The patient requested treatment, but any anytime she would be Hospice appropriate if she decided to stop therapy.  All questions were answered. The patient knows to call the clinic with any problems, questions or concerns. We can certainly see  the patient much sooner if necessary.  Patient and plan discussed with Dr. Farrel Gobble and he is in agreement with the aforementioned.   Subrena Devereux 09/29/2014

## 2014-09-28 ENCOUNTER — Non-Acute Institutional Stay (SKILLED_NURSING_FACILITY): Payer: Medicare HMO | Admitting: Internal Medicine

## 2014-09-28 DIAGNOSIS — E43 Unspecified severe protein-calorie malnutrition: Secondary | ICD-10-CM

## 2014-09-28 DIAGNOSIS — J910 Malignant pleural effusion: Secondary | ICD-10-CM

## 2014-09-29 ENCOUNTER — Ambulatory Visit (HOSPITAL_COMMUNITY): Payer: Medicare HMO | Admitting: Oncology

## 2014-09-29 ENCOUNTER — Encounter (HOSPITAL_BASED_OUTPATIENT_CLINIC_OR_DEPARTMENT_OTHER): Payer: Medicare HMO | Admitting: Oncology

## 2014-09-29 ENCOUNTER — Encounter (HOSPITAL_BASED_OUTPATIENT_CLINIC_OR_DEPARTMENT_OTHER): Payer: Medicare HMO

## 2014-09-29 ENCOUNTER — Other Ambulatory Visit (HOSPITAL_COMMUNITY): Payer: Self-pay | Admitting: Hematology and Oncology

## 2014-09-29 VITALS — BP 103/72 | HR 103 | Temp 97.7°F | Resp 24

## 2014-09-29 VITALS — BP 110/63 | HR 98 | Temp 97.5°F | Resp 20 | Wt 138.1 lb

## 2014-09-29 DIAGNOSIS — D631 Anemia in chronic kidney disease: Secondary | ICD-10-CM

## 2014-09-29 DIAGNOSIS — T451X5A Adverse effect of antineoplastic and immunosuppressive drugs, initial encounter: Secondary | ICD-10-CM | POA: Insufficient documentation

## 2014-09-29 DIAGNOSIS — D6481 Anemia due to antineoplastic chemotherapy: Secondary | ICD-10-CM | POA: Insufficient documentation

## 2014-09-29 DIAGNOSIS — N189 Chronic kidney disease, unspecified: Secondary | ICD-10-CM

## 2014-09-29 DIAGNOSIS — C482 Malignant neoplasm of peritoneum, unspecified: Secondary | ICD-10-CM

## 2014-09-29 DIAGNOSIS — J910 Malignant pleural effusion: Secondary | ICD-10-CM

## 2014-09-29 DIAGNOSIS — C569 Malignant neoplasm of unspecified ovary: Secondary | ICD-10-CM

## 2014-09-29 DIAGNOSIS — Z5111 Encounter for antineoplastic chemotherapy: Secondary | ICD-10-CM

## 2014-09-29 LAB — CBC WITH DIFFERENTIAL/PLATELET
Basophils Absolute: 0 10*3/uL (ref 0.0–0.1)
Basophils Relative: 1 % (ref 0–1)
EOS ABS: 0.1 10*3/uL (ref 0.0–0.7)
Eosinophils Relative: 3 % (ref 0–5)
HCT: 26.2 % — ABNORMAL LOW (ref 36.0–46.0)
Hemoglobin: 8.2 g/dL — ABNORMAL LOW (ref 12.0–15.0)
LYMPHS ABS: 1 10*3/uL (ref 0.7–4.0)
LYMPHS PCT: 27 % (ref 12–46)
MCH: 25.8 pg — AB (ref 26.0–34.0)
MCHC: 31.3 g/dL (ref 30.0–36.0)
MCV: 82.4 fL (ref 78.0–100.0)
Monocytes Absolute: 0.2 10*3/uL (ref 0.1–1.0)
Monocytes Relative: 4 % (ref 3–12)
NEUTROS ABS: 2.4 10*3/uL (ref 1.7–7.7)
NEUTROS PCT: 65 % (ref 43–77)
PLATELETS: 256 10*3/uL (ref 150–400)
RBC: 3.18 MIL/uL — AB (ref 3.87–5.11)
RDW: 18.2 % — ABNORMAL HIGH (ref 11.5–15.5)
WBC: 3.7 10*3/uL — AB (ref 4.0–10.5)

## 2014-09-29 LAB — COMPREHENSIVE METABOLIC PANEL
ALK PHOS: 61 U/L (ref 39–117)
ALT: 8 U/L (ref 0–35)
AST: 15 U/L (ref 0–37)
Albumin: 1.6 g/dL — ABNORMAL LOW (ref 3.5–5.2)
Anion gap: 10 (ref 5–15)
BUN: 14 mg/dL (ref 6–23)
CHLORIDE: 108 meq/L (ref 96–112)
CO2: 22 mEq/L (ref 19–32)
Calcium: 8.1 mg/dL — ABNORMAL LOW (ref 8.4–10.5)
Creatinine, Ser: 0.52 mg/dL (ref 0.50–1.10)
GFR calc non Af Amer: 90 mL/min — ABNORMAL LOW (ref >90)
GLUCOSE: 96 mg/dL (ref 70–99)
POTASSIUM: 3.9 meq/L (ref 3.7–5.3)
SODIUM: 140 meq/L (ref 137–147)
TOTAL PROTEIN: 5.4 g/dL — AB (ref 6.0–8.3)
Total Bilirubin: 0.2 mg/dL — ABNORMAL LOW (ref 0.3–1.2)

## 2014-09-29 MED ORDER — SODIUM CHLORIDE 0.9 % IJ SOLN: 10.0000 mL | INTRAMUSCULAR | Status: DC | PRN

## 2014-09-29 MED ORDER — PACLITAXEL CHEMO INJECTION 300 MG/50ML
60.0000 mg/m2 | Freq: Once | INTRAVENOUS | Status: AC
  Administered 2014-09-29: 102 mg via INTRAVENOUS
  Filled 2014-09-29: qty 17

## 2014-09-29 MED ORDER — SODIUM CHLORIDE 0.9 % IV SOLN
Freq: Once | INTRAVENOUS | Status: AC
  Administered 2014-09-29: 10:00:00 via INTRAVENOUS

## 2014-09-29 MED ORDER — DIPHENHYDRAMINE HCL 50 MG/ML IJ SOLN
50.0000 mg | Freq: Once | INTRAMUSCULAR | Status: AC
  Administered 2014-09-29: 50 mg via INTRAVENOUS
  Filled 2014-09-29: qty 1

## 2014-09-29 MED ORDER — PALONOSETRON HCL INJECTION 0.25 MG/5ML
0.2500 mg | Freq: Once | INTRAVENOUS | Status: AC
  Administered 2014-09-29: 0.25 mg via INTRAVENOUS
  Filled 2014-09-29: qty 5

## 2014-09-29 MED ORDER — FAMOTIDINE IN NACL 20-0.9 MG/50ML-% IV SOLN
20.0000 mg | Freq: Once | INTRAVENOUS | Status: AC
  Administered 2014-09-29: 20 mg via INTRAVENOUS
  Filled 2014-09-29: qty 50

## 2014-09-29 MED ORDER — HEPARIN SOD (PORK) LOCK FLUSH 100 UNIT/ML IV SOLN
500.0000 [IU] | Freq: Once | INTRAVENOUS | Status: AC | PRN
  Administered 2014-09-29: 500 [IU]
  Filled 2014-09-29: qty 5

## 2014-09-29 MED ORDER — SODIUM CHLORIDE 0.9 % IV SOLN
145.0000 mg | Freq: Once | INTRAVENOUS | Status: AC
  Administered 2014-09-29: 150 mg via INTRAVENOUS
  Filled 2014-09-29: qty 15

## 2014-09-29 MED ORDER — DEXAMETHASONE SODIUM PHOSPHATE 10 MG/ML IJ SOLN: 20.0000 mg | Freq: Once | INTRAMUSCULAR | Status: DC

## 2014-09-29 MED ORDER — DARBEPOETIN ALFA-POLYSORBATE 300 MCG/0.6ML IJ SOLN
300.0000 ug | Freq: Once | INTRAMUSCULAR | Status: AC
  Administered 2014-09-29: 300 ug via SUBCUTANEOUS

## 2014-09-29 MED ORDER — DARBEPOETIN ALFA-POLYSORBATE 300 MCG/0.6ML IJ SOLN
INTRAMUSCULAR | Status: AC
  Filled 2014-09-29: qty 0.6

## 2014-09-29 MED ORDER — SODIUM CHLORIDE 0.9 % IV SOLN: 150.0000 mg | Freq: Once | INTRAVENOUS | Status: DC

## 2014-09-29 MED ORDER — SODIUM CHLORIDE 0.9 % IV SOLN
Freq: Once | INTRAVENOUS | Status: AC
  Administered 2014-09-29: 10:00:00 via INTRAVENOUS
  Filled 2014-09-29: qty 5

## 2014-09-29 MED ORDER — LORAZEPAM 1 MG PO TABS
0.5000 mg | ORAL_TABLET | Freq: Once | ORAL | Status: AC
  Administered 2014-09-29: 12:00:00 via ORAL

## 2014-09-29 MED ORDER — LORAZEPAM 1 MG PO TABS
ORAL_TABLET | ORAL | Status: AC
  Filled 2014-09-29: qty 1

## 2014-09-29 NOTE — Patient Instructions (Signed)
[facility] Discharge Instructions for Patients Receiving Chemotherapy  Today you received the following chemotherapy agents Carboplatin and Taxol     If you develop nausea and vomiting, or diarrhea that is not controlled by your medication, call the clinic.  The clinic phone number is [phone]. Office hours are Monday-Friday 8:30am-5:00pm.  BELOW ARE SYMPTOMS THAT SHOULD BE REPORTED IMMEDIATELY:  *FEVER GREATER THAN 101.0 F  *CHILLS WITH OR WITHOUT FEVER  NAUSEA AND VOMITING THAT IS NOT CONTROLLED WITH YOUR NAUSEA MEDICATION  *UNUSUAL SHORTNESS OF BREATH  *UNUSUAL BRUISING OR BLEEDING  TENDERNESS IN MOUTH AND THROAT WITH OR WITHOUT PRESENCE OF ULCERS  *URINARY PROBLEMS  *BOWEL PROBLEMS  UNUSUAL RASH Items with * indicate a potential emergency and should be followed up as soon as possible. If you have an emergency after office hours please contact your primary care physician or go to the nearest emergency department.  Please call the clinic during office hours if you have any questions or concerns.   You may also contact the Patient Navigator at [phone] should you have any questions or need assistance in obtaining follow up care. _____________________________________________________________________ Have you asked about our STAR program?    STAR stands for Survivorship Training and Rehabilitation, and this is a nationally recognized cancer care program that focuses on survivorship and rehabilitation.  Cancer and cancer treatments may cause problems, such as, pain, making you feel tired and keeping you from doing the things that you need or want to do. Cancer rehabilitation can help. Our goal is to reduce these troubling effects and help you have the best quality of life possible.  You may receive a survey from a nurse that asks questions about your current state of health.  Based on the survey results, all eligible patients will be referred to the  [facility] program for an evaluation so we can better serve you! A frequently asked questions sheet is available upon request.

## 2014-09-29 NOTE — Progress Notes (Signed)
Tolerated chemo without any problems. Toileted x 1 prior to discharge. Pt currently in wheelchair eating lunch.  [name] presents today for injection per MD orders. Aranesp 351mcg administered SQ in right Abdomen. Administration without incident. Patient tolerated well.

## 2014-09-29 NOTE — Patient Instructions (Addendum)
[facility] Discharge Instructions  RECOMMENDATIONS MADE BY THE CONSULTANT AND ANY TEST RESULTS WILL BE SENT TO YOUR REFERRING PHYSICIAN.  EXAM FINDINGS BY THE PHYSICIAN TODAY AND SIGNS OR SYMPTOMS TO REPORT TO CLINIC OR PRIMARY PHYSICIAN: Exam and findings as discussed by Robynn Pane, PA-C.  Will start you on aranesp to help increase your red blood cell count. Report fevers, uncontrolled nausea or vomiting or other concerns.    INSTRUCTIONS/FOLLOW-UP: Follow-up as scheduled for chemotherapy and office visit in 3 weeks.  Thank you for choosing [facility] to provide your oncology and hematology care.  To afford each patient quality time with our providers, please arrive at least 15 minutes before your scheduled appointment time.  With your help, our goal is to use those 15 minutes to complete the necessary work-up to ensure our physicians have the information they need to help with your evaluation and healthcare recommendations.    Effective January 1st, 2014, we ask that you re-schedule your appointment with our physicians should you arrive 10 or more minutes late for your appointment.  We strive to give you quality time with our providers, and arriving late affects you and other patients whose appointments are after yours.    Again, thank you for choosing [facility].  Our hope is that these requests will decrease the amount of time that you wait before being seen by our physicians.       _____________________________________________________________  Should you have questions after your visit to [facility], please contact our office at [phone] between the hours of 8:30 a.m. and 4:30 p.m.  Voicemails left after 4:30 p.m. will not be returned until the following business day.  For prescription refill requests, have your pharmacy contact our office with your prescription refill request.     _______________________________________________________________  We hope that we have given you very good care.  You may receive a patient satisfaction survey in the mail, please complete it and return it as soon as possible.  We value your feedback!  _______________________________________________________________  Have you asked about our STAR program?  STAR stands for Survivorship Training and Rehabilitation, and this is a nationally recognized cancer care program that focuses on survivorship and rehabilitation.  Cancer and cancer treatments may cause problems, such as, pain, making you feel tired and keeping you from doing the things that you need or want to do. Cancer rehabilitation can help. Our goal is to reduce these troubling effects and help you have the best quality of life possible.  You may receive a survey from a nurse that asks questions about your current state of health.  Based on the survey results, all eligible patients will be referred to the [facility] program for an evaluation so we can better serve you!  A frequently asked questions sheet is available upon request.  Darbepoetin Alfa injection What is this medicine? DARBEPOETIN ALFA (dar be POE e tin AL fa) helps your body make more red blood cells. It is used to treat anemia caused by chronic kidney failure and chemotherapy. This medicine may be used for other purposes; ask your health care provider or pharmacist if you have questions. COMMON BRAND NAME(S): Aranesp What should I tell my health care provider before I take this medicine? They need to know if you have any of these conditions: -blood clotting disorders or history of blood clots -cancer patient not on chemotherapy -cystic fibrosis -heart disease, such as angina, heart failure, or  a history of a heart attack -hemoglobin level of 12 g/dL or greater -high blood pressure -low levels of folate, iron, or vitamin B12 -seizures -an unusual or allergic reaction to  darbepoetin, erythropoietin, albumin, hamster proteins, latex, other medicines, foods, dyes, or preservatives -pregnant or trying to get pregnant -breast-feeding How should I use this medicine? This medicine is for injection into a vein or under the skin. It is usually given by a health care professional in a hospital or clinic setting. If you get this medicine at home, you will be taught how to prepare and give this medicine. Do not shake the solution before you withdraw a dose. Use exactly as directed. Take your medicine at regular intervals. Do not take your medicine more often than directed. It is important that you put your used needles and syringes in a special sharps container. Do not put them in a trash can. If you do not have a sharps container, call your pharmacist or healthcare provider to get one. Talk to your pediatrician regarding the use of this medicine in children. While this medicine may be used in children as young as 1 year for selected conditions, precautions do apply. Overdosage: If you think you have taken too much of this medicine contact a poison control center or emergency room at once. NOTE: This medicine is only for you. Do not share this medicine with others. What if I miss a dose? If you miss a dose, take it as soon as you can. If it is almost time for your next dose, take only that dose. Do not take double or extra doses. What may interact with this medicine? Do not take this medicine with any of the following medications: -epoetin alfa This list may not describe all possible interactions. Give your health care provider a list of all the medicines, herbs, non-prescription drugs, or dietary supplements you use. Also tell them if you smoke, drink alcohol, or use illegal drugs. Some items may interact with your medicine. What should I watch for while using this medicine? Visit your prescriber or health care professional for regular checks on your progress and for the  needed blood tests and blood pressure measurements. It is especially important for the doctor to make sure your hemoglobin level is in the desired range, to limit the risk of potential side effects and to give you the best benefit. Keep all appointments for any recommended tests. Check your blood pressure as directed. Ask your doctor what your blood pressure should be and when you should contact him or her. As your body makes more red blood cells, you may need to take iron, folic acid, or vitamin B supplements. Ask your doctor or health care provider which products are right for you. If you have kidney disease continue dietary restrictions, even though this medication can make you feel better. Talk with your doctor or health care professional about the foods you eat and the vitamins that you take. What side effects may I notice from receiving this medicine? Side effects that you should report to your doctor or health care professional as soon as possible: -allergic reactions like skin rash, itching or hives, swelling of the face, lips, or tongue -breathing problems -changes in vision -chest pain -confusion, trouble speaking or understanding -feeling faint or lightheaded, falls -high blood pressure -muscle aches or pains -pain, swelling, warmth in the leg -rapid weight gain -severe headaches -sudden numbness or weakness of the face, arm or leg -trouble walking, dizziness, loss of balance  or coordination -seizures (convulsions) -swelling of the ankles, feet, hands -unusually weak or tired Side effects that usually do not require medical attention (report to your doctor or health care professional if they continue or are bothersome): -diarrhea -fever, chills (flu-like symptoms) -headaches -nausea, vomiting -redness, stinging, or swelling at site where injected This list may not describe all possible side effects. Call your doctor for medical advice about side effects. You may report side  effects to FDA at 1-800-FDA-1088. Where should I keep my medicine? Keep out of the reach of children. Store in a refrigerator between 2 and 8 degrees C (36 and 46 degrees F). Do not freeze. Do not shake. Throw away any unused portion if using a single-dose vial. Throw away any unused medicine after the expiration date. NOTE: This sheet is a summary. It may not cover all possible information. If you have questions about this medicine, talk to your doctor, pharmacist, or health care provider.  2015, [employer]vier/[employer] Standard. (2008-11-01 10:23:57)

## 2014-09-29 NOTE — Progress Notes (Signed)
Dr. Barnet Glasgow notified of patients current lab results.  Aranesp ordered.  Patient complained of being anxious.  Meds ordered.

## 2014-09-30 ENCOUNTER — Ambulatory Visit (HOSPITAL_COMMUNITY): Payer: Medicare HMO

## 2014-10-03 ENCOUNTER — Ambulatory Visit (HOSPITAL_COMMUNITY): Payer: Medicare HMO | Admitting: Oncology

## 2014-10-04 ENCOUNTER — Other Ambulatory Visit: Payer: Self-pay | Admitting: Cardiothoracic Surgery

## 2014-10-04 DIAGNOSIS — J910 Malignant pleural effusion: Secondary | ICD-10-CM

## 2014-10-04 NOTE — Progress Notes (Signed)
Patient ID: [name], female   DOB: [birth year], 79 y.o.   MRN: [id number]               PROGRESS NOTE  DATE:  09/28/2014    FACILITY: [facility]    LEVEL OF CARE:   SNF   Acute Visit   CHIEF COMPLAINT:  Weight gain, erythema and pain on her right lateral chest.    HISTORY OF PRESENT ILLNESS:  This is a patient who came to [place] in September.  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  She also later on developed an effusion on the right that has been assumed to be malignant.   I think she is also felt to have peritoneal metastases, all secondary to ovarian cancer.   She is currently receiving Taxol and carboplatin.    I was called yesterday to a report of erythema and pain in the right lateral chest.  I was concerned about the possibility of cellulitis and I am looking at this today.  I started her empirically over the phone on Levaquin yesterday.    LABORATORY DATA:  I have reviewed lab work, I think done through the cancer center on 09/21/2014.    Her sodium was 138, potassium 4.6.  BUN and creatinine are normal.    Liver function tests are also normal.    She has profound hypoalbuminemia at 1.6.    White count 5, hemoglobin 10.2, platelet count 365,000.  Differential count appears to be unremarkable.    Her CA-125 was 3,869.     REVIEW OF SYSTEMS:   CHEST/RESPIRATORY:  The patient has oxygen on.  She is not complaining of excessive cough or shortness of breath.   CARDIAC:   No clear chest pain.   GI:  No nausea, vomiting, abdominal pain, or diarrhea.    PHYSICAL EXAMINATION:   GENERAL APPEARANCE:  The patient is not in any distress.   CHEST/RESPIRATORY:  Decreased air entry.  Bronchial breathing at bases, compatible with her known pleural effusions.  On the right lateral chest, not abutting directly on the PleurX catheter, there is an area of swelling here with some erythema.  Apparently, this was warm and tender yesterday.  It is not  that way today.   CIRCULATION:    EDEMA/VARICOSITIES:  I also note that she has significant edema extending up into her flanks.  I suspect she has some degree of anasarca secondary to severe hypoalbuminemia.    ASSESSMENT/PLAN:  Severe hypoalbuminemia.  She has actually gained weight from a low of 125.5 pounds on 09/15/2014, up to 139 pounds on 09/27/2014.   This was after losing a similar amount of weight during her first stay here.  She is not on any diuretic.  I am going to try to start her on some.    Ovarian cancer.  Widely metastatic.  She is receiving chemotherapy.  She seems frail, but apparently is participating in therapy.    Cellulitis in the right lateral chest wall.  I have my doubts about this.  However, I will continue the Levaquin as ordered for now.  Apparently, things are a lot better here.    CPT CODE: 86761

## 2014-10-05 ENCOUNTER — Ambulatory Visit (INDEPENDENT_AMBULATORY_CARE_PROVIDER_SITE_OTHER): Payer: Medicare HMO | Admitting: Cardiothoracic Surgery

## 2014-10-05 ENCOUNTER — Encounter: Payer: Self-pay | Admitting: Cardiothoracic Surgery

## 2014-10-05 ENCOUNTER — Inpatient Hospital Stay
Admit: 2014-10-05 | Discharge: 2014-10-05 | Disposition: A | Discharge status: Home / Self Care | Payer: Medicare HMO | Attending: Cardiothoracic Surgery | Admitting: Cardiothoracic Surgery

## 2014-10-05 VITALS — BP 119/78 | HR 104 | Resp 20 | Ht 65.0 in | Wt 138.0 lb

## 2014-10-05 DIAGNOSIS — J948 Other specified pleural conditions: Secondary | ICD-10-CM

## 2014-10-05 DIAGNOSIS — J90 Pleural effusion, not elsewhere classified: Secondary | ICD-10-CM

## 2014-10-05 DIAGNOSIS — J910 Malignant pleural effusion: Secondary | ICD-10-CM

## 2014-10-05 NOTE — Progress Notes (Signed)
PCP is [name], MD Referring Provider is [name], MD  Chief Complaint  Patient presents with  . Routine Post Op    4 week f/u with CXR    [id number] followup for bilateral Pleurx catheters for recurrent malignant effusion  Patient currently lives in the  [facility] skilled nursing facility. She is receiving chemotherapy weekly. Right Pleurx catheter drains approximately 300 mL  3 times a week-left Pleurx catheter drains 50 cc. Catheter sites are healing well. She has some slight erythema over her right lateral chest wall which is nontender nonindurated. She has lost one pound in the last month.   Past Medical History  Diagnosis Date  . GERD (gastroesophageal reflux disease)   . Osteoarthritis   . Peripheral edema   . CHF (congestive heart failure), NYHA class IV     diastolic  . Cancer     Past Surgical History  Procedure Laterality Date  . Cholecystectomy    . Cardiac catheterization    . Total abdominal hysterectomy  40 years ago    patient does not know if ovaries were removed  . Appendectomy    . Total knee arthroplasty Left   . Chest tube insertion Left 08/04/2014    Procedure: INSERTION PLEURAL DRAINAGE CATHETER;  Surgeon: Ivin Poot, MD;  Location: [facility];  Service: Thoracic;  Laterality: Left;  . Chest tube insertion Right 09/13/2014    Procedure: INSERTION PLEURAL DRAINAGE CATHETER;  Surgeon: Gaye Pollack, MD;  Location: [facility];  Service: Thoracic;  Laterality: Right;    No family history on file.  Social History History  Substance Use Topics  . Smoking status: Never Smoker   . Smokeless tobacco: Not on file  . Alcohol Use: No    Current Outpatient Prescriptions  Medication Sig Dispense Refill  . HYDROcodone-acetaminophen (NORCO/VICODIN) 5-325 MG per tablet Take one tablet by mouth twice daily and every 6 hours as needed for breakthrough pain. Hold for sedation 180 tablet 0   No current facility-administered medications for this  visit.    Allergies  Allergen Reactions  . Penicillins     Unknown childhood reaction    Review of Systemspatient still fairly functional despite advanced cancer and bilateral malignant effusion  BP 119/78 mmHg  Pulse 104  Resp 20  Ht 5\' 5"  (1.651 m)  Wt 138 lb (62.596 kg)  BMI 22.96 kg/m2  SpO2 93% Physical Exam Alert and responsive Breath sounds diminished on the left clear on right Heart rate regular Pleurx catheter exercise clean and dry bilaterally  Diagnostic Tests: Chest x-ray-small loculated effusion on left, moderate effusion on right prior to drainage of the effusion with Pleurx catheter  Impression: Adequate control of recurrent effusions with Pleurx catheters  Plan:continue Monday Wednesday Friday drainage schedule for right catheter-decreased drainage schedule to only Mondays on the left side. Return with chest x-ray in 4 weeks.

## 2014-10-06 ENCOUNTER — Ambulatory Visit: Payer: Medicare HMO | Admitting: Gynecologic Oncology

## 2014-10-07 ENCOUNTER — Encounter (HOSPITAL_COMMUNITY): Payer: Medicare HMO | Attending: Hematology

## 2014-10-07 VITALS — BP 109/62 | HR 89 | Temp 97.6°F | Resp 20 | Ht 65.0 in | Wt 124.4 lb

## 2014-10-07 DIAGNOSIS — D631 Anemia in chronic kidney disease: Secondary | ICD-10-CM

## 2014-10-07 DIAGNOSIS — D6481 Anemia due to antineoplastic chemotherapy: Secondary | ICD-10-CM

## 2014-10-07 DIAGNOSIS — Z5111 Encounter for antineoplastic chemotherapy: Secondary | ICD-10-CM

## 2014-10-07 DIAGNOSIS — C569 Malignant neoplasm of unspecified ovary: Secondary | ICD-10-CM

## 2014-10-07 DIAGNOSIS — C482 Malignant neoplasm of peritoneum, unspecified: Secondary | ICD-10-CM

## 2014-10-07 DIAGNOSIS — N189 Chronic kidney disease, unspecified: Secondary | ICD-10-CM

## 2014-10-07 DIAGNOSIS — T451X5A Adverse effect of antineoplastic and immunosuppressive drugs, initial encounter: Secondary | ICD-10-CM

## 2014-10-07 LAB — CBC WITH DIFFERENTIAL/PLATELET
Basophils Absolute: 0 10*3/uL (ref 0.0–0.1)
Basophils Relative: 1 % (ref 0–1)
EOS ABS: 0.1 10*3/uL (ref 0.0–0.7)
EOS PCT: 3 % (ref 0–5)
HCT: 27.2 % — ABNORMAL LOW (ref 36.0–46.0)
Hemoglobin: 8.6 g/dL — ABNORMAL LOW (ref 12.0–15.0)
LYMPHS PCT: 37 % (ref 12–46)
Lymphs Abs: 0.9 10*3/uL (ref 0.7–4.0)
MCH: 26.5 pg (ref 26.0–34.0)
MCHC: 31.6 g/dL (ref 30.0–36.0)
MCV: 83.7 fL (ref 78.0–100.0)
Monocytes Absolute: 0.4 10*3/uL (ref 0.1–1.0)
Monocytes Relative: 15 % — ABNORMAL HIGH (ref 3–12)
Neutro Abs: 1.1 10*3/uL — ABNORMAL LOW (ref 1.7–7.7)
Neutrophils Relative %: 44 % (ref 43–77)
PLATELETS: 251 10*3/uL (ref 150–400)
RBC: 3.25 MIL/uL — ABNORMAL LOW (ref 3.87–5.11)
RDW: 20.8 % — ABNORMAL HIGH (ref 11.5–15.5)
WBC: 2.3 10*3/uL — AB (ref 4.0–10.5)

## 2014-10-07 LAB — COMPREHENSIVE METABOLIC PANEL
ALT: 8 U/L (ref 0–35)
AST: 15 U/L (ref 0–37)
Albumin: 2.1 g/dL — ABNORMAL LOW (ref 3.5–5.2)
Alkaline Phosphatase: 61 U/L (ref 39–117)
Anion gap: 9 (ref 5–15)
BUN: 16 mg/dL (ref 6–23)
CALCIUM: 8.2 mg/dL — AB (ref 8.4–10.5)
CO2: 30 meq/L (ref 19–32)
Chloride: 103 mEq/L (ref 96–112)
Creatinine, Ser: 0.62 mg/dL (ref 0.50–1.10)
GFR calc Af Amer: >90 mL/min (ref >90)
GFR calc non Af Amer: 85 mL/min — ABNORMAL LOW (ref >90)
Glucose, Bld: 96 mg/dL (ref 70–99)
Potassium: 3.8 mEq/L (ref 3.7–5.3)
SODIUM: 142 meq/L (ref 137–147)
TOTAL PROTEIN: 5.8 g/dL — AB (ref 6.0–8.3)
Total Bilirubin: 0.2 mg/dL — ABNORMAL LOW (ref 0.3–1.2)

## 2014-10-07 MED ORDER — DIPHENHYDRAMINE HCL 50 MG/ML IJ SOLN
50.0000 mg | Freq: Once | INTRAMUSCULAR | Status: AC
  Administered 2014-10-07: 50 mg via INTRAVENOUS
  Filled 2014-10-07: qty 1

## 2014-10-07 MED ORDER — SODIUM CHLORIDE 0.9 % IV SOLN
Freq: Once | INTRAVENOUS | Status: AC
  Administered 2014-10-07: 11:00:00 via INTRAVENOUS
  Filled 2014-10-07: qty 5

## 2014-10-07 MED ORDER — PALONOSETRON HCL INJECTION 0.25 MG/5ML
0.2500 mg | Freq: Once | INTRAVENOUS | Status: AC
  Administered 2014-10-07: 0.25 mg via INTRAVENOUS
  Filled 2014-10-07: qty 5

## 2014-10-07 MED ORDER — PACLITAXEL CHEMO INJECTION 300 MG/50ML
60.0000 mg/m2 | Freq: Once | INTRAVENOUS | Status: AC
  Administered 2014-10-07: 96 mg via INTRAVENOUS
  Filled 2014-10-07: qty 16

## 2014-10-07 MED ORDER — SODIUM CHLORIDE 0.9 % IV SOLN
145.0000 mg | Freq: Once | INTRAVENOUS | Status: AC
  Administered 2014-10-07: 150 mg via INTRAVENOUS
  Filled 2014-10-07: qty 15

## 2014-10-07 MED ORDER — SODIUM CHLORIDE 0.9 % IJ SOLN
10.0000 mL | INTRAMUSCULAR | Status: DC | PRN
  Administered 2014-10-07: 10 mL
  Filled 2014-10-07: qty 10

## 2014-10-07 MED ORDER — FAMOTIDINE IN NACL 20-0.9 MG/50ML-% IV SOLN
20.0000 mg | Freq: Once | INTRAVENOUS | Status: AC
  Administered 2014-10-07: 20 mg via INTRAVENOUS
  Filled 2014-10-07: qty 50

## 2014-10-07 MED ORDER — SODIUM CHLORIDE 0.9 % IV SOLN
Freq: Once | INTRAVENOUS | Status: AC
  Administered 2014-10-07: 11:00:00 via INTRAVENOUS

## 2014-10-07 MED ORDER — DARBEPOETIN ALFA 300 MCG/0.6ML IJ SOSY
300.0000 ug | PREFILLED_SYRINGE | Freq: Once | INTRAMUSCULAR | Status: AC
  Administered 2014-10-07: 300 ug via SUBCUTANEOUS
  Filled 2014-10-07: qty 0.6

## 2014-10-07 MED ORDER — HEPARIN SOD (PORK) LOCK FLUSH 100 UNIT/ML IV SOLN
500.0000 [IU] | Freq: Once | INTRAVENOUS | Status: AC | PRN
  Administered 2014-10-07: 500 [IU]
  Filled 2014-10-07: qty 5

## 2014-10-07 NOTE — Patient Instructions (Signed)
[facility] Discharge Instructions for Patients Receiving Chemotherapy  Today you received the following chemotherapy agents Taxol and Carbo. You received Aranesp injection for low hemoglobin and to help increase red blood cells. To help prevent nausea and vomiting after your treatment, we encourage you to take your nausea medication as instructed.   If you develop nausea and vomiting that is not controlled by your nausea medication, call the clinic. If it is after clinic hours your family physician or the after hours number for the clinic or go to the Emergency Department. BELOW ARE SYMPTOMS THAT SHOULD BE REPORTED IMMEDIATELY:  *FEVER GREATER THAN 101.0 F  *CHILLS WITH OR WITHOUT FEVER  NAUSEA AND VOMITING THAT IS NOT CONTROLLED WITH YOUR NAUSEA MEDICATION  *UNUSUAL SHORTNESS OF BREATH  *UNUSUAL BRUISING OR BLEEDING  TENDERNESS IN MOUTH AND THROAT WITH OR WITHOUT PRESENCE OF ULCERS  *URINARY PROBLEMS  *BOWEL PROBLEMS  UNUSUAL RASH Items with * indicate a potential emergency and should be followed up as soon as possible.  Report any issues/concerns as needed prior to appointments. Return as scheduled.  I have been informed and understand all the instructions given to me. I know to contact the clinic, my physician, or go to the Emergency Department if any problems should occur. I do not have any questions at this time, but understand that I may call the clinic during office hours or the Patient Navigator at [phone] should I have any questions or need assistance in obtaining follow up care.    __________________________________________  _____________  __________ Signature of Patient or Authorized Representative            Date                   Time    __________________________________________ Nurse's Signature

## 2014-10-07 NOTE — Progress Notes (Signed)
Tolerated well

## 2014-10-13 ENCOUNTER — Telehealth (HOSPITAL_COMMUNITY): Payer: Self-pay | Admitting: *Deleted

## 2014-10-13 NOTE — Telephone Encounter (Signed)
I received a call from [facility] letting us know that patient has rash like areas on her arms. They are described as being red, itchy, and whelp-like. Will get with Gershon Mussel and see what he thinks.

## 2014-10-17 ENCOUNTER — Other Ambulatory Visit: Payer: Self-pay

## 2014-10-17 DIAGNOSIS — C482 Malignant neoplasm of peritoneum, unspecified: Secondary | ICD-10-CM

## 2014-10-17 MED ORDER — HYDROCODONE-ACETAMINOPHEN 5-325 MG PO TABS: ORAL_TABLET | ORAL | Status: DC

## 2014-10-17 NOTE — Progress Notes (Signed)
Sarah Bogus, MD [name] [address]  Peritoneal carcinoma - Plan: CA 125, CA 125, DNR (Do Not Resuscitate)  Ovarian cancer, unspecified laterality - Plan: CA 125, CA 125, DNR (Do Not Resuscitate)  Drug-induced skin rash - Plan: predniSONE (DELTASONE) 10 MG tablet  Anemia due to chemotherapy  Constipation, unspecified constipation type  CURRENT THERAPY:Neoadjuvant Carboplatin/Paclitaxel  INTERVAL HISTORY: AIRELLE EVERDING 79 y.o. 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     Ovarian cancer   07/25/2014 Pathology Results Left pleural effusion positive for malignant cell consistent with adenocarcinoma.   07/27/2014 - 08/05/2014 Hospital Admission Heart failure with malignant pleural effusion   07/28/2014 Pathology Results Left pleural effusion positive for malignant cells consistent with adenocarcinoma (CK7 pos, CK20 neg, WT-1 pos, ER weakly pos, PR neg, TTF-1 neg, GCDFP-15 neg).  Findings favor gyn/ovarian primary.   08/01/2014 Pathology Results [facility] genomic findings: BRCA2 [id number]*31, STK11 loss, MYC amplification-equivocal, TP53 R273C, MLL2 QD6222.  Targeted treatments include Olaparib (BRCA2) and Everolimus/Temsirolimus (STK11).   08/04/2014 Procedure Left pleurX catheter by Dr. VLucianne LeiTright   09/08/2014 - 09/14/2014 Hospital Admission Hospitalized with the following main issues: acute on chronic CHF, acute on chronic respiratory failure with hypoxia, and malignant plueral effusion on right    09/13/2014 Procedure Right PleurX catheter by Dr. BCyndia Bent  09/13/2014 Pathology Results Right pleural effusion positive for malignant adenocarcinoma   09/22/2014 -  Chemotherapy Carboplatin/Paclitaxel days 1, 8, 15 every 28 days.   I personally reviewed and went over laboratory results with the [name].  The  results are noted within this dictation.  He Hgb is below 10 g/dL and therefore she will receive Aranesp 300 mcg injection today.  He other labs are good for chemotherapy administration today.  She notes that she tolerated systemic chemotherapy well for cycle 1.  She reports an episode of constipation that was resolved with an increase in her Stool softener.  Additionally, she broke out in a rash affecting her upper extremities.  See description below in physical exam section.  She reports that the rash was pruritic.  She was treated at the [facility] according to our orders with Elocon daily.  Her rash is much improved today.  Etiology not clearly identified, but drug rash from cremophor in paclitaxel is on our differential list and therefore, we will treat as such for the time being.  She notes that she still has her PleurX catheters drained and she thinks less fluid is being drained.  If this is true, it may be a clinical response to therapy thus far.  Today, I am impressed with how she looks clinically.  She is clear minded.  Her skin color is improved.  Her exam is benign for the most part.  She looks and feels good, she reports.   Oncologically, she otherwise denies any complaints and ROS questioning is negative.   Past Medical History  Diagnosis Date  . GERD (gastroesophageal reflux disease)   . Osteoarthritis   . Peripheral edema   . CHF (congestive heart failure), NYHA class IV     diastolic  . Cancer     has GERD; CONSTIPATION, CHRONIC; HEMATOCHEZIA; OSTEOARTHRITIS, LOWER LEG; KNEE PAIN; BURSITIS, KNEE; Dysphagia; Malignant pleural effusion; Edema of both legs; Heart failure, diastolic, with acute decompensation; Hyperkalemia; Anemia, unspecified; Peritoneal carcinoma; Rectal bleed; Chest pain; Protein-calorie  malnutrition, severe; Dysphagia, pharyngoesophageal phase; Constipation; Acute on chronic congestive heart failure; Acute on chronic respiratory failure with hypoxia;  Ovarian cancer; and Anemia due to chemotherapy on her problem list.     is allergic to penicillins.  [name] does not currently have medications on file.  Past Surgical History  Procedure Laterality Date  . Cholecystectomy    . Cardiac catheterization    . Total abdominal hysterectomy  40 years ago    [name] does not know if ovaries were removed  . Appendectomy    . Total knee arthroplasty Left   . Chest tube insertion Left 08/04/2014    Procedure: INSERTION PLEURAL DRAINAGE CATHETER;  Surgeon: Ivin Poot, MD;  Location: [facility];  Service: Thoracic;  Laterality: Left;  . Chest tube insertion Right 09/13/2014    Procedure: INSERTION PLEURAL DRAINAGE CATHETER;  Surgeon: Gaye Pollack, MD;  Location: [facility];  Service: Thoracic;  Laterality: Right;    Denies any headaches, dizziness, double vision, fevers, chills, night sweats, nausea, vomiting, diarrhea, constipation, chest pain, heart palpitations, shortness of breath, blood in stool, black tarry stool, urinary pain, urinary burning, urinary frequency, hematuria.   PHYSICAL EXAMINATION  ECOG PERFORMANCE STATUS: 3 - Symptomatic, >50% confined to bed  There were no vitals filed for this visit.  GENERAL:alert, no distress, well developed, comfortable, cooperative and smiling, in chemotherapy bed receiving pre-medications IV. SKIN: skin color, texture, turgor are normal, positive for: erythematous plaque-like lesions that are scaled in a random distribution on forearms of upper extremities HEAD: Normocephalic, No masses, lesions, tenderness or abnormalities EYES: normal, PERRLA, EOMI, Conjunctiva are pink and non-injected EARS: External ears normal OROPHARYNX:no exudate, no erythema, lips, buccal mucosa, and tongue normal and mucous membranes are moist  NECK: supple, no stridor, non-tender, trachea midline LYMPH:  no palpable lymphadenopathy BREAST:not examined LUNGS: clear to auscultation.  Right sided catheters (PleurX) are  dressed and clean on inspection without worrisome findings on inspection. HEART: regular rate & rhythm, no murmurs, no gallops, S1 normal and S2 normal ABDOMEN:abdomen soft, non-tender, normal bowel sounds. BACK: Back symmetric, no curvature. EXTREMITIES:less then 2 second capillary refill, no joint deformities, effusion, or inflammation, no skin discoloration, no cyanosis, positive findings:  edema bilateral trace to 1+ pitting edema in lower extremities, no erythema or heat.  NEURO: alert & oriented x 3 with fluent speech, no focal motor/sensory deficits   LABORATORY DATA: CBC    Component Value Date/Time   WBC 4.5 10/20/2014 0906   RBC 3.37* 10/20/2014 0906   RBC 4.29 07/27/2014 2257   HGB 8.9* 10/20/2014 0906   HCT 28.7* 10/20/2014 0906   PLT 194 10/20/2014 0906   MCV 85.2 10/20/2014 0906   MCH 26.4 10/20/2014 0906   MCHC 31.0 10/20/2014 0906   RDW 20.0* 10/20/2014 0906   LYMPHSABS 0.9 10/20/2014 0906   MONOABS 0.5 10/20/2014 0906   EOSABS 0.1 10/20/2014 0906   BASOSABS 0.0 10/20/2014 0906      Chemistry      Component Value Date/Time   NA 142 10/07/2014 0942   K 3.8 10/07/2014 0942   CL 103 10/07/2014 0942   CO2 30 10/07/2014 0942   BUN 16 10/07/2014 0942   CREATININE 0.62 10/07/2014 0942      Component Value Date/Time   CALCIUM 8.2* 10/07/2014 0942   ALKPHOS 61 10/07/2014 0942   AST 15 10/07/2014 0942   ALT 8 10/07/2014 0942   BILITOT 0.2* 10/07/2014 0942     Lab Results  Component Value  Date   CA125 3869* 09/21/2014      ASSESSMENT:  1. Primary peritoneal carcinomatosis with malignant left and right pleural effusion, status post B/L PleurX catheter insertion. BRCA2 is positive on tumor cells present in the pleural fluid making the [name] a candidate for Olaparib as second line treatment. Began Carboplatin/Paclitaxel on 09/22/2014. 2.History of adenomatous polyps in November of 2010.  3. Status post hysterectomy and bilateral salpingo-oophorectomy.   4. Status post cholecystectomy  5. Chemotherapy-induced anemia, on Aranesp 300 mcg 6. Rash, improving on Elocon.  ?Cremophor-induced rash? 7. Constipation, resolved with stool softener  [name] Active Problem List   Diagnosis Date Noted  . Anemia due to chemotherapy 09/29/2014  . Ovarian cancer 09/09/2014  . Acute on chronic congestive heart failure 09/08/2014  . Acute on chronic respiratory failure with hypoxia 09/08/2014  . Constipation 09/03/2014  . Protein-calorie malnutrition, severe 08/15/2014  . Dysphagia, pharyngoesophageal phase 08/15/2014  . Rectal bleed 08/14/2014  . Chest pain 08/14/2014  . Peritoneal carcinoma 08/13/2014  . Hyperkalemia 08/03/2014  . Anemia, unspecified 08/03/2014  . Heart failure, diastolic, with acute decompensation 07/30/2014  . Malignant pleural effusion 07/27/2014  . Edema of both legs 07/27/2014  . GERD 10/11/2009  . CONSTIPATION, CHRONIC 10/11/2009  . HEMATOCHEZIA 10/11/2009  . Dysphagia 10/11/2009  . OSTEOARTHRITIS, LOWER LEG 11/19/2007  . KNEE PAIN 11/19/2007  . BURSITIS, KNEE 11/19/2007     PLAN:  1. I personally reviewed and went over laboratory results with the [name].  The results are noted within this dictation. 2. Pre-chemo labs as ordered, including CA 125 3. Embark on cycle 2 of chemotherapy as planned 4. Continue Elocon cream to affected areas for rash 5. Prednisone 10 mg PO in AM daily to prevent Cremophor-induced rash 6. Aranesp 300 mcg today and as planned in the future every 2 weeks.  Supportive therapy plan reviewed. 7. Continue stool softener to prevent constipation 8. Return in 2 weeks for follow-up.  THERAPY PLAN:  The plan is to receive 3-4 cycles of chemotherapy in a neoadjuvant setting with interval debulking surgery dependant upon response to first-line therapy. The [name] requested treatment, but at anytime she would be Hospice appropriate if she decided to stop therapy.  We will follow CA-125 biomarker  during treatment to evaluate response to therapy.  All questions were answered. The [name] knows to call the clinic with any problems, questions or concerns. We can certainly see the [name] much sooner if necessary.  [name] and plan discussed with Dr. Farrel Gobble and he is in agreement with the aforementioned.   KEFALAS,THOMAS 10/20/2014

## 2014-10-17 NOTE — Telephone Encounter (Signed)
RX faxed to [facility] @ [phone]. Phone number [phone]  

## 2014-10-18 ENCOUNTER — Encounter: Payer: Self-pay | Admitting: Hematology and Oncology

## 2014-10-20 ENCOUNTER — Encounter (HOSPITAL_COMMUNITY): Payer: Self-pay

## 2014-10-20 ENCOUNTER — Encounter (HOSPITAL_BASED_OUTPATIENT_CLINIC_OR_DEPARTMENT_OTHER): Payer: Medicare HMO

## 2014-10-20 ENCOUNTER — Encounter (HOSPITAL_BASED_OUTPATIENT_CLINIC_OR_DEPARTMENT_OTHER): Payer: Medicare HMO | Admitting: Oncology

## 2014-10-20 ENCOUNTER — Encounter (HOSPITAL_COMMUNITY): Payer: Medicare HMO

## 2014-10-20 DIAGNOSIS — C569 Malignant neoplasm of unspecified ovary: Secondary | ICD-10-CM

## 2014-10-20 DIAGNOSIS — K5900 Constipation, unspecified: Secondary | ICD-10-CM

## 2014-10-20 DIAGNOSIS — N189 Chronic kidney disease, unspecified: Secondary | ICD-10-CM | POA: Diagnosis present

## 2014-10-20 DIAGNOSIS — C482 Malignant neoplasm of peritoneum, unspecified: Secondary | ICD-10-CM

## 2014-10-20 DIAGNOSIS — Z5111 Encounter for antineoplastic chemotherapy: Secondary | ICD-10-CM

## 2014-10-20 DIAGNOSIS — J910 Malignant pleural effusion: Secondary | ICD-10-CM

## 2014-10-20 DIAGNOSIS — L270 Generalized skin eruption due to drugs and medicaments taken internally: Secondary | ICD-10-CM

## 2014-10-20 DIAGNOSIS — D631 Anemia in chronic kidney disease: Secondary | ICD-10-CM | POA: Diagnosis present

## 2014-10-20 DIAGNOSIS — D6481 Anemia due to antineoplastic chemotherapy: Secondary | ICD-10-CM

## 2014-10-20 DIAGNOSIS — T451X5A Adverse effect of antineoplastic and immunosuppressive drugs, initial encounter: Secondary | ICD-10-CM

## 2014-10-20 LAB — CBC WITH DIFFERENTIAL/PLATELET
BASOS ABS: 0 10*3/uL (ref 0.0–0.1)
Basophils Relative: 1 % (ref 0–1)
EOS PCT: 2 % (ref 0–5)
Eosinophils Absolute: 0.1 10*3/uL (ref 0.0–0.7)
HEMATOCRIT: 28.7 % — AB (ref 36.0–46.0)
HEMOGLOBIN: 8.9 g/dL — AB (ref 12.0–15.0)
LYMPHS PCT: 21 % (ref 12–46)
Lymphs Abs: 0.9 10*3/uL (ref 0.7–4.0)
MCH: 26.4 pg (ref 26.0–34.0)
MCHC: 31 g/dL (ref 30.0–36.0)
MCV: 85.2 fL (ref 78.0–100.0)
MONO ABS: 0.5 10*3/uL (ref 0.1–1.0)
MONOS PCT: 11 % (ref 3–12)
Neutro Abs: 3 10*3/uL (ref 1.7–7.7)
Neutrophils Relative %: 65 % (ref 43–77)
Platelets: 194 10*3/uL (ref 150–400)
RBC: 3.37 MIL/uL — ABNORMAL LOW (ref 3.87–5.11)
RDW: 20 % — AB (ref 11.5–15.5)
WBC: 4.5 10*3/uL (ref 4.0–10.5)

## 2014-10-20 MED ORDER — SODIUM CHLORIDE 0.9 % IV SOLN
Freq: Once | INTRAVENOUS | Status: AC
  Administered 2014-10-20: 10:00:00 via INTRAVENOUS
  Filled 2014-10-20: qty 5

## 2014-10-20 MED ORDER — PREDNISONE 10 MG PO TABS: 10.0000 mg | ORAL_TABLET | Freq: Every day | ORAL | Status: AC

## 2014-10-20 MED ORDER — FAMOTIDINE IN NACL 20-0.9 MG/50ML-% IV SOLN
20.0000 mg | Freq: Once | INTRAVENOUS | Status: AC
  Administered 2014-10-20: 20 mg via INTRAVENOUS
  Filled 2014-10-20: qty 50

## 2014-10-20 MED ORDER — PALONOSETRON HCL INJECTION 0.25 MG/5ML
0.2500 mg | Freq: Once | INTRAVENOUS | Status: AC
  Administered 2014-10-20: 0.25 mg via INTRAVENOUS
  Filled 2014-10-20: qty 5

## 2014-10-20 MED ORDER — HEPARIN SOD (PORK) LOCK FLUSH 100 UNIT/ML IV SOLN
500.0000 [IU] | Freq: Once | INTRAVENOUS | Status: AC | PRN
  Administered 2014-10-20: 500 [IU]
  Filled 2014-10-20: qty 5

## 2014-10-20 MED ORDER — DARBEPOETIN ALFA 300 MCG/0.6ML IJ SOSY
300.0000 ug | PREFILLED_SYRINGE | Freq: Once | INTRAMUSCULAR | Status: AC
  Administered 2014-10-20: 300 ug via SUBCUTANEOUS
  Filled 2014-10-20: qty 0.6

## 2014-10-20 MED ORDER — DIPHENHYDRAMINE HCL 50 MG/ML IJ SOLN
50.0000 mg | Freq: Once | INTRAMUSCULAR | Status: AC
  Administered 2014-10-20: 50 mg via INTRAVENOUS
  Filled 2014-10-20: qty 1

## 2014-10-20 MED ORDER — CARBOPLATIN CHEMO INJECTION 450 MG/45ML
145.0000 mg | Freq: Once | INTRAVENOUS | Status: AC
  Administered 2014-10-20: 150 mg via INTRAVENOUS
  Filled 2014-10-20: qty 15

## 2014-10-20 MED ORDER — SODIUM CHLORIDE 0.9 % IV SOLN
Freq: Once | INTRAVENOUS | Status: AC
  Administered 2014-10-20: 10:00:00 via INTRAVENOUS

## 2014-10-20 MED ORDER — PACLITAXEL CHEMO INJECTION 300 MG/50ML
60.0000 mg/m2 | Freq: Once | INTRAVENOUS | Status: AC
  Administered 2014-10-20: 96 mg via INTRAVENOUS
  Filled 2014-10-20: qty 16

## 2014-10-20 MED ORDER — SODIUM CHLORIDE 0.9 % IJ SOLN: 10.0000 mL | INTRAMUSCULAR | Status: DC | PRN

## 2014-10-20 NOTE — Progress Notes (Signed)
Please see chemotherapy encounter note.

## 2014-10-20 NOTE — Patient Instructions (Signed)
[place] Discharge Instructions  RECOMMENDATIONS MADE BY THE CONSULTANT AND ANY TEST RESULTS WILL BE SENT TO YOUR REFERRING PHYSICIAN.  We will treat you today as planned for Day 1 of cycle 2.  Labs are reviewed and you qualify for Aranesp injection today to support your hemoglobin. I have printed a prescription for Prednisone 10 mg by mouth every AM to help resolve your rash. You may continue Elocon cream for your rash as well.  You will return next week for chemotherapy.  In two weeks, you will be seen in an office visit and be given chemotherapy depending on your lab results.   Please report to the ED with any shaking chills, fevers, or other signs or symptoms of infection.  Please call the [facility] during business hours with any questions, concerns, or issues associated with your malignancy and/or chemotherapy including, but not limited to, increased pain, uncontrolled nausea/vomiting, constipation, diarrhea, etc.   Thank you for choosing [facility] to provide your oncology and hematology care.  To afford each patient quality time with our providers, please arrive at least 15 minutes before your scheduled appointment time.  With your help, our goal is to use those 15 minutes to complete the necessary work-up to ensure our physicians have the information they need to help with your evaluation and healthcare recommendations.    Effective January 1st, 2014, we ask that you re-schedule your appointment with our physicians should you arrive 10 or more minutes late for your appointment.  We strive to give you quality time with our providers, and arriving late affects you and other patients whose appointments are after yours.    Again, thank you for choosing [facility].  Our hope is that these requests will decrease the amount of time that you wait before being seen by our physicians.        _____________________________________________________________  Should you have questions after your visit to [facility], please contact our office at [phone] between the hours of 8:30 a.m. and 5:00 p.m.  Voicemails left after 4:30 p.m. will not be returned until the following business day.  For prescription refill requests, have your pharmacy contact our office with your prescription refill request.

## 2014-10-20 NOTE — Progress Notes (Signed)
Patient tolerated chemotherapy well. Aranesp was given to treat Hgb. Of 8.9.

## 2014-10-20 NOTE — Patient Instructions (Signed)
[facility] Discharge Instructions for Patients Receiving Chemotherapy  Today you received the following chemotherapy agents Taxol and carboplatin  Please call for any questions or concerns.    If you develop nausea and vomiting, or diarrhea that is not controlled by your medication, call the clinic.  The clinic phone number is [phone]. Office hours are Monday-Friday 8:30am-5:00pm.  BELOW ARE SYMPTOMS THAT SHOULD BE REPORTED IMMEDIATELY:  *FEVER GREATER THAN 101.0 F  *CHILLS WITH OR WITHOUT FEVER  NAUSEA AND VOMITING THAT IS NOT CONTROLLED WITH YOUR NAUSEA MEDICATION  *UNUSUAL SHORTNESS OF BREATH  *UNUSUAL BRUISING OR BLEEDING  TENDERNESS IN MOUTH AND THROAT WITH OR WITHOUT PRESENCE OF ULCERS  *URINARY PROBLEMS  *BOWEL PROBLEMS  UNUSUAL RASH Items with * indicate a potential emergency and should be followed up as soon as possible. If you have an emergency after office hours please contact your primary care physician or go to the nearest emergency department.  Please call the clinic during office hours if you have any questions or concerns.   You may also contact the Patient Navigator at [phone] should you have any questions or need assistance in obtaining follow up care. _____________________________________________________________________ Have you asked about our STAR program?    STAR stands for Survivorship Training and Rehabilitation, and this is a nationally recognized cancer care program that focuses on survivorship and rehabilitation.  Cancer and cancer treatments may cause problems, such as, pain, making you feel tired and keeping you from doing the things that you need or want to do. Cancer rehabilitation can help. Our goal is to reduce these troubling effects and help you have the best quality of life possible.  You may receive a survey from a nurse that asks questions about your current state of health.  Based on the survey results, all  eligible patients will be referred to the [facility] program for an evaluation so we can better serve you! A frequently asked questions sheet is available upon request.

## 2014-10-21 ENCOUNTER — Telehealth (HOSPITAL_COMMUNITY): Payer: Self-pay | Admitting: *Deleted

## 2014-10-21 LAB — CA 125: CA 125: 335 U/mL — ABNORMAL HIGH (ref <35)

## 2014-10-21 NOTE — Telephone Encounter (Signed)
-----   Message from Baird Cancer, PA-C sent at 10/21/2014  9:54 AM EST ----- Let her or her family know!!

## 2014-10-21 NOTE — Telephone Encounter (Signed)
Spoke with patients daughter Raynelle Highland regarding ca125 results. Very pleased. Verbalized understanding.

## 2014-10-26 ENCOUNTER — Encounter (HOSPITAL_BASED_OUTPATIENT_CLINIC_OR_DEPARTMENT_OTHER): Payer: Medicare HMO

## 2014-10-26 ENCOUNTER — Encounter (HOSPITAL_COMMUNITY): Payer: Self-pay

## 2014-10-26 DIAGNOSIS — Z5111 Encounter for antineoplastic chemotherapy: Secondary | ICD-10-CM

## 2014-10-26 DIAGNOSIS — C569 Malignant neoplasm of unspecified ovary: Secondary | ICD-10-CM

## 2014-10-26 DIAGNOSIS — C482 Malignant neoplasm of peritoneum, unspecified: Secondary | ICD-10-CM

## 2014-10-26 LAB — CBC WITH DIFFERENTIAL/PLATELET
Basophils Absolute: 0 K/uL (ref 0.0–0.1)
Basophils Relative: 0 % (ref 0–1)
Eosinophils Absolute: 0.1 K/uL (ref 0.0–0.7)
Eosinophils Relative: 1 % (ref 0–5)
HCT: 32.5 % — ABNORMAL LOW (ref 36.0–46.0)
Hemoglobin: 10.1 g/dL — ABNORMAL LOW (ref 12.0–15.0)
Lymphocytes Relative: 12 % (ref 12–46)
Lymphs Abs: 0.7 K/uL (ref 0.7–4.0)
MCH: 26.9 pg (ref 26.0–34.0)
MCHC: 31.1 g/dL (ref 30.0–36.0)
MCV: 86.4 fL (ref 78.0–100.0)
Monocytes Absolute: 0.2 K/uL (ref 0.1–1.0)
Monocytes Relative: 3 % (ref 3–12)
Neutro Abs: 4.7 K/uL (ref 1.7–7.7)
Neutrophils Relative %: 84 % — ABNORMAL HIGH (ref 43–77)
Platelets: 170 K/uL (ref 150–400)
RBC: 3.76 MIL/uL — ABNORMAL LOW (ref 3.87–5.11)
RDW: 20 % — ABNORMAL HIGH (ref 11.5–15.5)
WBC: 5.7 K/uL (ref 4.0–10.5)

## 2014-10-26 MED ORDER — PALONOSETRON HCL INJECTION 0.25 MG/5ML
0.2500 mg | Freq: Once | INTRAVENOUS | Status: AC
  Administered 2014-10-26: 0.25 mg via INTRAVENOUS
  Filled 2014-10-26: qty 5

## 2014-10-26 MED ORDER — SODIUM CHLORIDE 0.9 % IV SOLN
145.0000 mg | Freq: Once | INTRAVENOUS | Status: AC
  Administered 2014-10-26: 150 mg via INTRAVENOUS
  Filled 2014-10-26: qty 15

## 2014-10-26 MED ORDER — PACLITAXEL CHEMO INJECTION 300 MG/50ML
60.0000 mg/m2 | Freq: Once | INTRAVENOUS | Status: AC
  Administered 2014-10-26: 96 mg via INTRAVENOUS
  Filled 2014-10-26: qty 16

## 2014-10-26 MED ORDER — FAMOTIDINE IN NACL 20-0.9 MG/50ML-% IV SOLN
20.0000 mg | Freq: Once | INTRAVENOUS | Status: AC
  Administered 2014-10-26: 20 mg via INTRAVENOUS
  Filled 2014-10-26: qty 50

## 2014-10-26 MED ORDER — SODIUM CHLORIDE 0.9 % IV SOLN
Freq: Once | INTRAVENOUS | Status: AC
  Administered 2014-10-26: 11:00:00 via INTRAVENOUS
  Filled 2014-10-26: qty 5

## 2014-10-26 MED ORDER — SODIUM CHLORIDE 0.9 % IJ SOLN: 10.0000 mL | INTRAMUSCULAR | Status: DC | PRN

## 2014-10-26 MED ORDER — HEPARIN SOD (PORK) LOCK FLUSH 100 UNIT/ML IV SOLN
500.0000 [IU] | Freq: Once | INTRAVENOUS | Status: AC | PRN
  Administered 2014-10-26: 500 [IU]
  Filled 2014-10-26: qty 5

## 2014-10-26 MED ORDER — SODIUM CHLORIDE 0.9 % IV SOLN
Freq: Once | INTRAVENOUS | Status: AC
  Administered 2014-10-26: 11:00:00 via INTRAVENOUS

## 2014-10-26 MED ORDER — DIPHENHYDRAMINE HCL 50 MG/ML IJ SOLN
50.0000 mg | Freq: Once | INTRAMUSCULAR | Status: AC
  Administered 2014-10-26: 50 mg via INTRAVENOUS
  Filled 2014-10-26: qty 1

## 2014-10-26 NOTE — Patient Instructions (Signed)
[facility] Discharge Instructions for Patients Receiving Chemotherapy  Today you received the following chemotherapy agents Taxol and carboplatin. Please follow up as scheduled. Take your medications as prescribed.       If you develop nausea and vomiting, or diarrhea that is not controlled by your medication, call the clinic.  The clinic phone number is [phone]. Office hours are Monday-Friday 8:30am-5:00pm.  BELOW ARE SYMPTOMS THAT SHOULD BE REPORTED IMMEDIATELY:  *FEVER GREATER THAN 101.0 F  *CHILLS WITH OR WITHOUT FEVER  NAUSEA AND VOMITING THAT IS NOT CONTROLLED WITH YOUR NAUSEA MEDICATION  *UNUSUAL SHORTNESS OF BREATH  *UNUSUAL BRUISING OR BLEEDING  TENDERNESS IN MOUTH AND THROAT WITH OR WITHOUT PRESENCE OF ULCERS  *URINARY PROBLEMS  *BOWEL PROBLEMS  UNUSUAL RASH Items with * indicate a potential emergency and should be followed up as soon as possible. If you have an emergency after office hours please contact your primary care physician or go to the nearest emergency department.  Please call the clinic during office hours if you have any questions or concerns.   You may also contact the Patient Navigator at [phone] should you have any questions or need assistance in obtaining follow up care. _____________________________________________________________________ Have you asked about our STAR program?    STAR stands for Survivorship Training and Rehabilitation, and this is a nationally recognized cancer care program that focuses on survivorship and rehabilitation.  Cancer and cancer treatments may cause problems, such as, pain, making you feel tired and keeping you from doing the things that you need or want to do. Cancer rehabilitation can help. Our goal is to reduce these troubling effects and help you have the best quality of life possible.  You may receive a survey from a nurse that asks questions about your current state of health.   Based on the survey results, all eligible patients will be referred to the [facility] program for an evaluation so we can better serve you! A frequently asked questions sheet is available upon request.

## 2014-10-26 NOTE — Progress Notes (Signed)
Patient tolerated infusion well.

## 2014-10-31 NOTE — Progress Notes (Signed)
[name], MD [address] [place] [address]  Peritoneal carcinoma  Malignant pleural effusion  Anemia due to chemotherapy  CURRENT THERAPY: Neoadjuvant Carboplatin/Paclitaxel  INTERVAL HISTORY: [name] 79 y.o. 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    Ovarian cancer   07/25/2014 Pathology Results Left pleural effusion positive for malignant cell consistent with adenocarcinoma.   07/27/2014 - 08/05/2014 Hospital Admission Heart failure with malignant pleural effusion   07/28/2014 Pathology Results Left pleural effusion positive for malignant cells consistent with adenocarcinoma (CK7 pos, CK20 neg, WT-1 pos, ER weakly pos, PR neg, TTF-1 neg, GCDFP-15 neg).  Findings favor gyn/ovarian primary.   08/01/2014 Pathology Results [facility] genomic findings: BRCA2 R2350f*31, STK11 loss, MYC amplification-equivocal, TP53 R273C, MLL2 [id number].  Targeted treatments include Olaparib (BRCA2) and Everolimus/Temsirolimus (STK11).   08/03/2014 Tumor Marker CA 125 4954   08/04/2014 Procedure Left pleurX catheter by Dr. VLucianne LeiTright   09/08/2014 - 09/14/2014 Hospital Admission Hospitalized with the following main issues: acute on chronic CHF, acute on chronic respiratory failure with hypoxia, and malignant plueral effusion on right    09/13/2014 Procedure Right PleurX catheter by Dr. BCyndia Bent  09/13/2014 Pathology Results Right pleural effusion positive for malignant adenocarcinoma   09/21/2014 Tumor Marker CA 125 3869   09/22/2014 -  Chemotherapy Carboplatin/Paclitaxel days 1, 8, 15 every 28 days.   10/20/2014 Tumor Marker CA 125 335   I personally reviewed and went over laboratory results with the patient.  The results are noted within this dictation.  She has experienced a significant drop in her CA 125 that is now 335.   This is a clinical response to therapy.  Additionally, she notes that she is not getting much fluid from her PleurX catheters any more.  This too is an indication of response to therapy.    She reports that her appetite is good and she had a nice Thanksgiving.  She continues to have a rash that occurs a few days after chemotherapy.  She notes that it is well controlled with Elocon cream.  She is encouraged to continue this use.  "As long as I don't get sick on my stomach, I want to keep going with therapy."  Oncologically, she denies any complaints and ROS questioning is negative.  Past Medical History  Diagnosis Date  . GERD (gastroesophageal reflux disease)   . Osteoarthritis   . Peripheral edema   . CHF (congestive heart failure), NYHA class IV     diastolic  . Cancer     has GERD; CONSTIPATION, CHRONIC; HEMATOCHEZIA; OSTEOARTHRITIS, LOWER LEG; KNEE PAIN; BURSITIS, KNEE; Dysphagia; Malignant pleural effusion; Edema of both legs; Heart failure, diastolic, with acute decompensation; Hyperkalemia; Anemia, unspecified; Peritoneal carcinoma; Rectal bleed; Chest pain; Protein-calorie malnutrition, severe; Dysphagia, pharyngoesophageal phase; Constipation; Acute on chronic congestive heart failure; Acute on chronic respiratory failure with hypoxia; Ovarian cancer; and Anemia due to chemotherapy on her problem list.     is allergic to penicillins.  Ms. BRansomdoes not currently have medications on file.  Past Surgical History  Procedure Laterality Date  . Cholecystectomy    . Cardiac catheterization    . Total abdominal hysterectomy  40 years ago    patient does not know if ovaries were removed  . Appendectomy    . Total knee arthroplasty Left   . Chest tube insertion Left 08/04/2014  Procedure: INSERTION PLEURAL DRAINAGE CATHETER;  Surgeon: Ivin Poot, MD;  Location: [facility];  Service: Thoracic;  Laterality: Left;  . Chest tube insertion Right 09/13/2014    Procedure: INSERTION  PLEURAL DRAINAGE CATHETER;  Surgeon: Gaye Pollack, MD;  Location: [facility];  Service: Thoracic;  Laterality: Right;    Denies any headaches, dizziness, double vision, fevers, chills, night sweats, nausea, vomiting, diarrhea, constipation, chest pain, heart palpitations, shortness of breath, blood in stool, black tarry stool, urinary pain, urinary burning, urinary frequency, hematuria.   PHYSICAL EXAMINATION  ECOG PERFORMANCE STATUS: 3 - Symptomatic, >50% confined to bed  There were no vitals filed for this visit.  GENERAL:alert, no distress, well developed, comfortable, cooperative and smiling SKIN: skin color, texture, turgor are normal, no rashes or significant lesions, nonspecific resolved rash on upper extremities bilaterally. HEAD: Normocephalic, No masses, lesions, tenderness or abnormalities EYES: normal, PERRLA, EOMI, Conjunctiva are pink and non-injected EARS: External ears normal OROPHARYNX:mucous membranes are moist  NECK: supple, no adenopathy, thyroid normal size, non-tender, without nodularity, no stridor, non-tender, trachea midline LYMPH:  no palpable lymphadenopathy BREAST:not examined LUNGS: clear to auscultation  HEART: regular rate & rhythm ABDOMEN:abdomen soft, non-tender and normal bowel sounds BACK: Back symmetric, no curvature. EXTREMITIES:less then 2 second capillary refill, no cyanosis  NEURO: alert & oriented x 3 with fluent speech    LABORATORY DATA: CBC    Component Value Date/Time   WBC 4.4 11/03/2014 0955   RBC 3.55* 11/03/2014 0955   RBC 4.29 07/27/2014 2257   HGB 9.6* 11/03/2014 0955   HCT 30.9* 11/03/2014 0955   PLT 200 11/03/2014 0955   MCV 87.0 11/03/2014 0955   MCH 27.0 11/03/2014 0955   MCHC 31.1 11/03/2014 0955   RDW 20.8* 11/03/2014 0955   LYMPHSABS 0.8 11/03/2014 0955   MONOABS 0.1 11/03/2014 0955   EOSABS 0.1 11/03/2014 0955   BASOSABS 0.0 11/03/2014 0955      Chemistry      Component Value Date/Time   NA 139 11/03/2014 0955    K 3.7 11/03/2014 0955   CL 101 11/03/2014 0955   CO2 27 11/03/2014 0955   BUN 31* 11/03/2014 0955   CREATININE 0.60 11/03/2014 0955      Component Value Date/Time   CALCIUM 9.1 11/03/2014 0955   ALKPHOS 62 11/03/2014 0955   AST 14 11/03/2014 0955   ALT 7 11/03/2014 0955   BILITOT 0.3 11/03/2014 0955     Lab Results  Component Value Date   CA125 335* 10/20/2014      ASSESSMENT:  1. Primary peritoneal carcinomatosis with malignant left and right pleural effusion, status post B/L PleurX catheter insertion. 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 Began Carboplatin/Paclitaxel on 09/22/2014. 2.History of adenomatous polyps in November of 2010.  3. Status post hysterectomy and bilateral salpingo-oophorectomy.  4. Status post cholecystectomy  5. Chemotherapy-induced anemia, on Aranesp 300 mcg 6. Rash, improved on Elocon and PO Prednisone. ?Cremophor-induced rash? 7. Constipation, resolved with stool softener  Patient Active Problem List   Diagnosis Date Noted  . Anemia due to chemotherapy 09/29/2014  . Ovarian cancer 09/09/2014  . Acute on chronic congestive heart failure 09/08/2014  . Acute on chronic respiratory failure with hypoxia 09/08/2014  . Constipation 09/03/2014  . Protein-calorie malnutrition, severe 08/15/2014  . Dysphagia, pharyngoesophageal phase 08/15/2014  . Rectal bleed 08/14/2014  . Chest pain 08/14/2014  . Peritoneal carcinoma 08/13/2014  .  Hyperkalemia 08/03/2014  . Anemia, unspecified 08/03/2014  . Heart failure, diastolic, with acute decompensation 07/30/2014  . Malignant pleural effusion 07/27/2014  . Edema of both legs 07/27/2014  . GERD 10/11/2009  . CONSTIPATION, CHRONIC 10/11/2009  . HEMATOCHEZIA 10/11/2009  . Dysphagia 10/11/2009  . OSTEOARTHRITIS, LOWER LEG 11/19/2007  . KNEE PAIN 11/19/2007  . BURSITIS, KNEE 11/19/2007     PLAN:  1. I  personally reviewed and went over laboratory results with the patient.  The results are noted within this dictation. 2. Pre-chemo labs as planned 3. Continue chemotherapy as planned.  Clinical response is observed. 4. Continue PO Prednisone daily 5. Continue Elocon cream as directed for rash. 6. Return in 2 weeks for follow-up and start of cycle 3 of chemotherapy.    THERAPY PLAN:  The plan is to receive 3-4 cycles of chemotherapy in a neoadjuvant setting with interval debulking surgery dependant upon response to first-line therapy. We will follow CA-125 biomarker during treatment to evaluate response to therapy.  All questions were answered. The patient knows to call the clinic with any problems, questions or concerns. We can certainly see the patient much sooner if necessary.  Patient and plan discussed with Dr. Farrel Gobble and he is in agreement with the aforementioned.   Asaiah Hunnicutt 11/03/2014

## 2014-11-02 ENCOUNTER — Ambulatory Visit: Payer: Medicare HMO | Admitting: Cardiothoracic Surgery

## 2014-11-03 ENCOUNTER — Encounter (HOSPITAL_BASED_OUTPATIENT_CLINIC_OR_DEPARTMENT_OTHER): Payer: Medicare HMO | Admitting: Oncology

## 2014-11-03 ENCOUNTER — Other Ambulatory Visit (HOSPITAL_COMMUNITY): Payer: Self-pay | Admitting: Hematology and Oncology

## 2014-11-03 ENCOUNTER — Encounter (HOSPITAL_COMMUNITY): Payer: Self-pay | Admitting: Oncology

## 2014-11-03 ENCOUNTER — Encounter (HOSPITAL_COMMUNITY): Payer: Medicare HMO

## 2014-11-03 ENCOUNTER — Encounter (HOSPITAL_COMMUNITY): Payer: Medicare HMO | Attending: Hematology

## 2014-11-03 DIAGNOSIS — C482 Malignant neoplasm of peritoneum, unspecified: Secondary | ICD-10-CM

## 2014-11-03 DIAGNOSIS — Z5111 Encounter for antineoplastic chemotherapy: Secondary | ICD-10-CM

## 2014-11-03 DIAGNOSIS — T451X5A Adverse effect of antineoplastic and immunosuppressive drugs, initial encounter: Secondary | ICD-10-CM

## 2014-11-03 DIAGNOSIS — N189 Chronic kidney disease, unspecified: Secondary | ICD-10-CM | POA: Insufficient documentation

## 2014-11-03 DIAGNOSIS — C569 Malignant neoplasm of unspecified ovary: Secondary | ICD-10-CM | POA: Diagnosis present

## 2014-11-03 DIAGNOSIS — J910 Malignant pleural effusion: Secondary | ICD-10-CM

## 2014-11-03 DIAGNOSIS — D6481 Anemia due to antineoplastic chemotherapy: Secondary | ICD-10-CM

## 2014-11-03 DIAGNOSIS — R21 Rash and other nonspecific skin eruption: Secondary | ICD-10-CM

## 2014-11-03 DIAGNOSIS — D631 Anemia in chronic kidney disease: Secondary | ICD-10-CM | POA: Diagnosis present

## 2014-11-03 LAB — CBC WITH DIFFERENTIAL/PLATELET
Basophils Absolute: 0 10*3/uL (ref 0.0–0.1)
Basophils Relative: 1 % (ref 0–1)
EOS PCT: 2 % (ref 0–5)
Eosinophils Absolute: 0.1 10*3/uL (ref 0.0–0.7)
HCT: 30.9 % — ABNORMAL LOW (ref 36.0–46.0)
HEMOGLOBIN: 9.6 g/dL — AB (ref 12.0–15.0)
LYMPHS ABS: 0.8 10*3/uL (ref 0.7–4.0)
LYMPHS PCT: 19 % (ref 12–46)
MCH: 27 pg (ref 26.0–34.0)
MCHC: 31.1 g/dL (ref 30.0–36.0)
MCV: 87 fL (ref 78.0–100.0)
MONOS PCT: 3 % (ref 3–12)
Monocytes Absolute: 0.1 10*3/uL (ref 0.1–1.0)
Neutro Abs: 3.4 10*3/uL (ref 1.7–7.7)
Neutrophils Relative %: 75 % (ref 43–77)
PLATELETS: 200 10*3/uL (ref 150–400)
RBC: 3.55 MIL/uL — AB (ref 3.87–5.11)
RDW: 20.8 % — ABNORMAL HIGH (ref 11.5–15.5)
WBC: 4.4 10*3/uL (ref 4.0–10.5)

## 2014-11-03 LAB — COMPREHENSIVE METABOLIC PANEL
ALT: 7 U/L (ref 0–35)
AST: 14 U/L (ref 0–37)
Albumin: 3.2 g/dL — ABNORMAL LOW (ref 3.5–5.2)
Alkaline Phosphatase: 62 U/L (ref 39–117)
Anion gap: 11 (ref 5–15)
BILIRUBIN TOTAL: 0.3 mg/dL (ref 0.3–1.2)
BUN: 31 mg/dL — AB (ref 6–23)
CALCIUM: 9.1 mg/dL (ref 8.4–10.5)
CO2: 27 meq/L (ref 19–32)
Chloride: 101 mEq/L (ref 96–112)
Creatinine, Ser: 0.6 mg/dL (ref 0.50–1.10)
GFR calc Af Amer: >90 mL/min (ref >90)
GFR calc non Af Amer: 86 mL/min — ABNORMAL LOW (ref >90)
GLUCOSE: 119 mg/dL — AB (ref 70–99)
Potassium: 3.7 mEq/L (ref 3.7–5.3)
SODIUM: 139 meq/L (ref 137–147)
Total Protein: 7.1 g/dL (ref 6.0–8.3)

## 2014-11-03 MED ORDER — PALONOSETRON HCL INJECTION 0.25 MG/5ML
0.2500 mg | Freq: Once | INTRAVENOUS | Status: AC
Start: 2014-11-03 — End: 2014-11-03
  Administered 2014-11-03: 0.25 mg via INTRAVENOUS

## 2014-11-03 MED ORDER — DIPHENHYDRAMINE HCL 50 MG/ML IJ SOLN
INTRAMUSCULAR | Status: AC
  Filled 2014-11-03: qty 1

## 2014-11-03 MED ORDER — DARBEPOETIN ALFA 300 MCG/0.6ML IJ SOSY
300.0000 ug | PREFILLED_SYRINGE | Freq: Once | INTRAMUSCULAR | Status: AC
  Administered 2014-11-03: 300 ug via SUBCUTANEOUS
  Filled 2014-11-03: qty 0.6

## 2014-11-03 MED ORDER — SODIUM CHLORIDE 0.9 % IV SOLN
Freq: Once | INTRAVENOUS | Status: AC
  Administered 2014-11-03: 10:00:00 via INTRAVENOUS

## 2014-11-03 MED ORDER — SODIUM CHLORIDE 0.9 % IJ SOLN
10.0000 mL | INTRAMUSCULAR | Status: DC | PRN
  Administered 2014-11-03: 10 mL
  Filled 2014-11-03: qty 10

## 2014-11-03 MED ORDER — HEPARIN SOD (PORK) LOCK FLUSH 100 UNIT/ML IV SOLN
500.0000 [IU] | Freq: Once | INTRAVENOUS | Status: AC | PRN
  Administered 2014-11-03: 500 [IU]
  Filled 2014-11-03: qty 5

## 2014-11-03 MED ORDER — PACLITAXEL CHEMO INJECTION 300 MG/50ML
60.0000 mg/m2 | Freq: Once | INTRAVENOUS | Status: AC
  Administered 2014-11-03: 96 mg via INTRAVENOUS
  Filled 2014-11-03: qty 16

## 2014-11-03 MED ORDER — SODIUM CHLORIDE 0.9 % IV SOLN
Freq: Once | INTRAVENOUS | Status: AC
  Administered 2014-11-03: 11:00:00 via INTRAVENOUS
  Filled 2014-11-03: qty 5

## 2014-11-03 MED ORDER — FAMOTIDINE IN NACL 20-0.9 MG/50ML-% IV SOLN
20.0000 mg | Freq: Once | INTRAVENOUS | Status: AC
Start: 2014-11-03 — End: 2014-11-03
  Administered 2014-11-03: 20 mg via INTRAVENOUS

## 2014-11-03 MED ORDER — DIPHENHYDRAMINE HCL 50 MG/ML IJ SOLN
50.0000 mg | Freq: Once | INTRAMUSCULAR | Status: AC
  Administered 2014-11-03: 50 mg via INTRAVENOUS

## 2014-11-03 MED ORDER — SODIUM CHLORIDE 0.9 % IV SOLN
145.0000 mg | Freq: Once | INTRAVENOUS | Status: AC
  Administered 2014-11-03: 150 mg via INTRAVENOUS
  Filled 2014-11-03: qty 15

## 2014-11-03 MED ORDER — FAMOTIDINE IN NACL 20-0.9 MG/50ML-% IV SOLN
INTRAVENOUS | Status: AC
  Filled 2014-11-03: qty 50

## 2014-11-03 MED ORDER — PALONOSETRON HCL INJECTION 0.25 MG/5ML
INTRAVENOUS | Status: AC
  Filled 2014-11-03: qty 5

## 2014-11-03 NOTE — Progress Notes (Signed)
[name] presents today for injection per MD orders. Aranesp 300 mcg administered SQ in right Abdomen. Administration without incident. Patient tolerated well.

## 2014-11-03 NOTE — Patient Instructions (Signed)
[facility] Discharge Instructions for Patients Receiving Chemotherapy  Today you received the following chemotherapy agents Cycle 2 Day 15 Taxol and Carbo. You also received Aloxi,dexamethasone,emend,benadryl and IV pepcid today. Your hemoglobin is 9.6 today, you received Aranesp 300 mcg injection.  To help prevent nausea and vomiting after your treatment, we encourage you to take your nausea medication as instructed.   If you develop nausea and vomiting that is not controlled by your nausea medication, call the clinic. If it is after clinic hours your family physician or the after hours number for the clinic or go to the Emergency Department.   BELOW ARE SYMPTOMS THAT SHOULD BE REPORTED IMMEDIATELY:  *FEVER GREATER THAN 101.0 F  *CHILLS WITH OR WITHOUT FEVER  NAUSEA AND VOMITING THAT IS NOT CONTROLLED WITH YOUR NAUSEA MEDICATION  *UNUSUAL SHORTNESS OF BREATH  *UNUSUAL BRUISING OR BLEEDING  TENDERNESS IN MOUTH AND THROAT WITH OR WITHOUT PRESENCE OF ULCERS  *URINARY PROBLEMS  *BOWEL PROBLEMS  UNUSUAL RASH Items with * indicate a potential emergency and should be followed up as soon as possible.  RETURN AS SCHEDULED.  I have been informed and understand all the instructions given to me. I know to contact the clinic, my physician, or go to the Emergency Department if any problems should occur. I do not have any questions at this time, but understand that I may call the clinic during office hours or the Patient Navigator at [phone] should I have any questions or need assistance in obtaining follow up care.    __________________________________________  _____________  __________ Signature of Patient or Authorized Representative            Date                   Time    __________________________________________ Nurse's Signature

## 2014-11-03 NOTE — Progress Notes (Unsigned)
..  [facility] Discharge Instructions for Patients Receiving Chemotherapy  Today you received the following chemotherapy agents ***  To help prevent nausea and vomiting after your treatment, we encourage you to take your nausea medication {CHL ONC AP TAKE HOME FKCL:275170017} Begin taking it at *** and take it as often as prescribed for the next {CHL ONC MEDICATION HOURS:115400110} hours.   If you develop nausea and vomiting, or diarrhea that is not controlled by your medication, call the clinic.  The clinic phone number is [phone]. Office hours are Monday-Friday 8:30am-5:00pm.  BELOW ARE SYMPTOMS THAT SHOULD BE REPORTED IMMEDIATELY:  *FEVER GREATER THAN 101.0 F  *CHILLS WITH OR WITHOUT FEVER  NAUSEA AND VOMITING THAT IS NOT CONTROLLED WITH YOUR NAUSEA MEDICATION  *UNUSUAL SHORTNESS OF BREATH  *UNUSUAL BRUISING OR BLEEDING  TENDERNESS IN MOUTH AND THROAT WITH OR WITHOUT PRESENCE OF ULCERS  *URINARY PROBLEMS  *BOWEL PROBLEMS  UNUSUAL RASH Items with * indicate a potential emergency and should be followed up as soon as possible. If you have an emergency after office hours please contact your primary care physician or go to the nearest emergency department.  Please call the clinic during office hours if you have any questions or concerns.   You may also contact the Patient Navigator at [phone] should you have any questions or need assistance in obtaining follow up care. _____________________________________________________________________ Have you asked about our STAR program?    STAR stands for Survivorship Training and Rehabilitation, and this is a nationally recognized cancer care program that focuses on survivorship and rehabilitation.  Cancer and cancer treatments may cause problems, such as, pain, making you feel tired and keeping you from doing the things that you need or want to do. Cancer rehabilitation can help. Our goal is to reduce these  troubling effects and help you have the best quality of life possible.  You may receive a survey from a nurse that asks questions about your current state of health.  Based on the survey results, all eligible patients will be referred to the [facility] program for an evaluation so we can better serve you! A frequently asked questions sheet is available upon request.

## 2014-11-03 NOTE — Patient Instructions (Signed)
[place] Discharge Instructions  RECOMMENDATIONS MADE BY THE CONSULTANT AND ANY TEST RESULTS WILL BE SENT TO YOUR REFERRING PHYSICIAN.  Continue Elocon cream to rash as ordered Continue PO Prednisone as planned daily to help prevent rash. Return in 2 weeks for start of cycle 3 of chemotherapy and follow-up appointment.  Thank you for choosing [facility] to provide your oncology and hematology care.  To afford each patient quality time with our providers, please arrive at least 15 minutes before your scheduled appointment time.  With your help, our goal is to use those 15 minutes to complete the necessary work-up to ensure our physicians have the information they need to help with your evaluation and healthcare recommendations.    Effective January 1st, 2014, we ask that you re-schedule your appointment with our physicians should you arrive 10 or more minutes late for your appointment.  We strive to give you quality time with our providers, and arriving late affects you and other patients whose appointments are after yours.    Again, thank you for choosing [facility].  Our hope is that these requests will decrease the amount of time that you wait before being seen by our physicians.       _____________________________________________________________  Should you have questions after your visit to [facility], please contact our office at [phone] between the hours of 8:30 a.m. and 5:00 p.m.  Voicemails left after 4:30 p.m. will not be returned until the following business day.  For prescription refill requests, have your pharmacy contact our office with your prescription refill request.

## 2014-11-08 ENCOUNTER — Other Ambulatory Visit (HOSPITAL_COMMUNITY): Payer: Self-pay | Admitting: Hematology and Oncology

## 2014-11-08 MED ORDER — OXYCODONE HCL 5 MG PO TABS: ORAL_TABLET | ORAL | Status: DC

## 2014-11-15 ENCOUNTER — Other Ambulatory Visit: Payer: Self-pay | Admitting: Cardiothoracic Surgery

## 2014-11-15 DIAGNOSIS — J910 Malignant pleural effusion: Secondary | ICD-10-CM

## 2014-11-16 ENCOUNTER — Inpatient Hospital Stay
Admit: 2014-11-16 | Discharge: 2014-11-16 | Disposition: A | Discharge status: Home / Self Care | Payer: Medicare HMO | Attending: Cardiothoracic Surgery | Admitting: Cardiothoracic Surgery

## 2014-11-16 ENCOUNTER — Ambulatory Visit (INDEPENDENT_AMBULATORY_CARE_PROVIDER_SITE_OTHER): Payer: Medicare HMO | Admitting: Cardiothoracic Surgery

## 2014-11-16 ENCOUNTER — Other Ambulatory Visit: Payer: Self-pay | Admitting: *Deleted

## 2014-11-16 ENCOUNTER — Encounter: Payer: Self-pay | Admitting: Cardiothoracic Surgery

## 2014-11-16 VITALS — BP 120/76 | HR 78 | Resp 16 | Ht 65.0 in | Wt 125.0 lb

## 2014-11-16 DIAGNOSIS — J910 Malignant pleural effusion: Secondary | ICD-10-CM

## 2014-11-16 DIAGNOSIS — J90 Pleural effusion, not elsewhere classified: Secondary | ICD-10-CM

## 2014-11-16 DIAGNOSIS — J948 Other specified pleural conditions: Secondary | ICD-10-CM

## 2014-11-16 MED ORDER — OXYCODONE HCL 5 MG PO TABS: ORAL_TABLET | ORAL | Status: DC

## 2014-11-16 NOTE — Progress Notes (Signed)
PCP is [name], MD Referring Provider is [name], MD  Chief Complaint  Patient presents with  . Routine Post Op    f/u bilateral pleurX    HPI:the patient returns for monthly Pleurx catheter check The patient has advanced stage vulvar cancer and bilateral malignant effusions. For the past 2 weeks the drainage has been scant. The patient denies shortness of breath. Chest x-ray today shows no effusions and the subsequent drainage was scant in the office. The catheters are in good position.  The skilled nursing facility will drain the catheters once more in 5 days. I project that they will remain dry and we will schedule bilateral catheter removal January 5 at [facility] hospital outpatient area.   Past Medical History  Diagnosis Date  . GERD (gastroesophageal reflux disease)   . Osteoarthritis   . Peripheral edema   . CHF (congestive heart failure), NYHA class IV     diastolic  . Cancer     Past Surgical History  Procedure Laterality Date  . Cholecystectomy    . Cardiac catheterization    . Total abdominal hysterectomy  40 years ago    patient does not know if ovaries were removed  . Appendectomy    . Total knee arthroplasty Left   . Chest tube insertion Left 08/04/2014    Procedure: INSERTION PLEURAL DRAINAGE CATHETER;  Surgeon: Ivin Poot, MD;  Location: [place];  Service: Thoracic;  Laterality: Left;  . Chest tube insertion Right 09/13/2014    Procedure: INSERTION PLEURAL DRAINAGE CATHETER;  Surgeon: Gaye Pollack, MD;  Location: [facility];  Service: Thoracic;  Laterality: Right;    No family history on file.  Social History History  Substance Use Topics  . Smoking status: Never Smoker   . Smokeless tobacco: Not on file  . Alcohol Use: No    Current Outpatient Prescriptions  Medication Sig Dispense Refill  . oxyCODONE (ROXICODONE) 5 MG immediate release tablet Take 2-3 tablets every 4 hours to control pain. 100 tablet 0  . predniSONE (DELTASONE) 10 MG  tablet Take 1 tablet (10 mg total) by mouth daily with breakfast. 30 tablet 1   No current facility-administered medications for this visit.    Allergies  Allergen Reactions  . Penicillins     Unknown childhood reaction    Review of Systemsactively receiving chemotherapy  BP 120/76 mmHg  Pulse 78  Resp 16  Ht 5\' 5"  (1.651 m)  Wt 125 lb (56.7 kg)  BMI 20.80 kg/m2  SpO2 98% Physical Exam Alert and appropriate Breath sounds clear Both catheters were drained today without significant drainage Catheter sites are clean and dry  Diagnostic Tests: Chest x-ray today reviewed showing no pleural effusion with catheters in good position  Impression: Resolution of recurrent bilateral pleural effusions-we'll schedule a tentative date for catheter removal January 5, 1 PM at [facility] hospital outpatient-short stay area  [place] on January 5

## 2014-11-16 NOTE — Telephone Encounter (Signed)
[facility] 

## 2014-11-17 ENCOUNTER — Encounter (HOSPITAL_COMMUNITY): Payer: Self-pay

## 2014-11-17 ENCOUNTER — Encounter (HOSPITAL_BASED_OUTPATIENT_CLINIC_OR_DEPARTMENT_OTHER): Payer: Medicare HMO

## 2014-11-17 ENCOUNTER — Encounter (HOSPITAL_COMMUNITY): Payer: Medicare HMO

## 2014-11-17 DIAGNOSIS — C569 Malignant neoplasm of unspecified ovary: Secondary | ICD-10-CM

## 2014-11-17 DIAGNOSIS — J910 Malignant pleural effusion: Secondary | ICD-10-CM

## 2014-11-17 DIAGNOSIS — C482 Malignant neoplasm of peritoneum, unspecified: Secondary | ICD-10-CM

## 2014-11-17 DIAGNOSIS — D6481 Anemia due to antineoplastic chemotherapy: Secondary | ICD-10-CM

## 2014-11-17 DIAGNOSIS — Z5111 Encounter for antineoplastic chemotherapy: Secondary | ICD-10-CM

## 2014-11-17 DIAGNOSIS — R21 Rash and other nonspecific skin eruption: Secondary | ICD-10-CM

## 2014-11-17 LAB — COMPREHENSIVE METABOLIC PANEL
ALBUMIN: 3.3 g/dL — AB (ref 3.5–5.2)
ALK PHOS: 62 U/L (ref 39–117)
ALT: 8 U/L (ref 0–35)
ANION GAP: 12 (ref 5–15)
AST: 11 U/L (ref 0–37)
BILIRUBIN TOTAL: 0.3 mg/dL (ref 0.3–1.2)
BUN: 34 mg/dL — AB (ref 6–23)
CO2: 29 meq/L (ref 19–32)
Calcium: 9.4 mg/dL (ref 8.4–10.5)
Chloride: 101 mEq/L (ref 96–112)
Creatinine, Ser: 0.73 mg/dL (ref 0.50–1.10)
GFR calc Af Amer: >90 mL/min (ref >90)
GFR, EST NON AFRICAN AMERICAN: 80 mL/min — AB (ref >90)
Glucose, Bld: 136 mg/dL — ABNORMAL HIGH (ref 70–99)
POTASSIUM: 3.8 meq/L (ref 3.7–5.3)
Sodium: 142 mEq/L (ref 137–147)
Total Protein: 6.9 g/dL (ref 6.0–8.3)

## 2014-11-17 LAB — CBC WITH DIFFERENTIAL/PLATELET
BASOS PCT: 0 % (ref 0–1)
Basophils Absolute: 0 10*3/uL (ref 0.0–0.1)
Eosinophils Absolute: 0.1 10*3/uL (ref 0.0–0.7)
Eosinophils Relative: 1 % (ref 0–5)
HEMATOCRIT: 33.7 % — AB (ref 36.0–46.0)
HEMOGLOBIN: 10.2 g/dL — AB (ref 12.0–15.0)
LYMPHS ABS: 0.6 10*3/uL — AB (ref 0.7–4.0)
Lymphocytes Relative: 12 % (ref 12–46)
MCH: 26.6 pg (ref 26.0–34.0)
MCHC: 30.3 g/dL (ref 30.0–36.0)
MCV: 88 fL (ref 78.0–100.0)
MONO ABS: 0.5 10*3/uL (ref 0.1–1.0)
Monocytes Relative: 9 % (ref 3–12)
NEUTROS ABS: 4.1 10*3/uL (ref 1.7–7.7)
Neutrophils Relative %: 78 % — ABNORMAL HIGH (ref 43–77)
Platelets: 199 10*3/uL (ref 150–400)
RBC: 3.83 MIL/uL — AB (ref 3.87–5.11)
RDW: 20.4 % — ABNORMAL HIGH (ref 11.5–15.5)
WBC: 5.3 10*3/uL (ref 4.0–10.5)

## 2014-11-17 MED ORDER — SODIUM CHLORIDE 0.9 % IJ SOLN: 10.0000 mL | INTRAMUSCULAR | Status: DC | PRN

## 2014-11-17 MED ORDER — SODIUM CHLORIDE 0.9 % IV SOLN
Freq: Once | INTRAVENOUS | Status: AC
  Administered 2014-11-17: 10:00:00 via INTRAVENOUS

## 2014-11-17 MED ORDER — FAMOTIDINE IN NACL 20-0.9 MG/50ML-% IV SOLN
20.0000 mg | Freq: Once | INTRAVENOUS | Status: AC
  Administered 2014-11-17: 20 mg via INTRAVENOUS
  Filled 2014-11-17: qty 50

## 2014-11-17 MED ORDER — PALONOSETRON HCL INJECTION 0.25 MG/5ML
0.2500 mg | Freq: Once | INTRAVENOUS | Status: AC
  Administered 2014-11-17: 0.25 mg via INTRAVENOUS
  Filled 2014-11-17: qty 5

## 2014-11-17 MED ORDER — SODIUM CHLORIDE 0.9 % IV SOLN
Freq: Once | INTRAVENOUS | Status: AC
  Administered 2014-11-17: 11:00:00 via INTRAVENOUS
  Filled 2014-11-17: qty 5

## 2014-11-17 MED ORDER — PACLITAXEL CHEMO INJECTION 300 MG/50ML
60.0000 mg/m2 | Freq: Once | INTRAVENOUS | Status: AC
  Administered 2014-11-17: 96 mg via INTRAVENOUS
  Filled 2014-11-17: qty 16

## 2014-11-17 MED ORDER — CARBOPLATIN CHEMO INJECTION 450 MG/45ML
145.0000 mg | Freq: Once | INTRAVENOUS | Status: AC
  Administered 2014-11-17: 150 mg via INTRAVENOUS
  Filled 2014-11-17: qty 15

## 2014-11-17 MED ORDER — HEPARIN SOD (PORK) LOCK FLUSH 100 UNIT/ML IV SOLN
500.0000 [IU] | Freq: Once | INTRAVENOUS | Status: AC | PRN
  Administered 2014-11-17: 500 [IU]
  Filled 2014-11-17: qty 5

## 2014-11-17 MED ORDER — DIPHENHYDRAMINE HCL 50 MG/ML IJ SOLN
50.0000 mg | Freq: Once | INTRAMUSCULAR | Status: AC
Start: 2014-11-17 — End: 2014-11-17
  Administered 2014-11-17: 50 mg via INTRAVENOUS
  Filled 2014-11-17: qty 1

## 2014-11-17 NOTE — Patient Instructions (Signed)
[facility] Discharge Instructions for Patients Receiving Chemotherapy  Today you received the following chemotherapy agents carboplatin and paclitaxel.  Please follow up as scheduled.  Please call for any questions or concerns. We are will check on changing your chemotherapy agent to abraxane instead paclitaxel.  We will let you know, it will not affect your treatment schedule. Hopefully, it will help with your skin rash.    If you develop nausea and vomiting, or diarrhea that is not controlled by your medication, call the clinic.  The clinic phone number is [phone]. Office hours are Monday-Friday 8:30am-5:00pm.  BELOW ARE SYMPTOMS THAT SHOULD BE REPORTED IMMEDIATELY:  *FEVER GREATER THAN 101.0 F  *CHILLS WITH OR WITHOUT FEVER  NAUSEA AND VOMITING THAT IS NOT CONTROLLED WITH YOUR NAUSEA MEDICATION  *UNUSUAL SHORTNESS OF BREATH  *UNUSUAL BRUISING OR BLEEDING  TENDERNESS IN MOUTH AND THROAT WITH OR WITHOUT PRESENCE OF ULCERS  *URINARY PROBLEMS  *BOWEL PROBLEMS  UNUSUAL RASH Items with * indicate a potential emergency and should be followed up as soon as possible. If you have an emergency after office hours please contact your primary care physician or go to the nearest emergency department.  Please call the clinic during office hours if you have any questions or concerns.   You may also contact the Patient Navigator at [phone] should you have any questions or need assistance in obtaining follow up care. _____________________________________________________________________ Have you asked about our STAR program?    STAR stands for Survivorship Training and Rehabilitation, and this is a nationally recognized cancer care program that focuses on survivorship and rehabilitation.  Cancer and cancer treatments may cause problems, such as, pain, making you feel tired and keeping you from doing the things that you need or want to do. Cancer rehabilitation can  help. Our goal is to reduce these troubling effects and help you have the best quality of life possible.  You may receive a survey from a nurse that asks questions about your current state of health.  Based on the survey results, all eligible patients will be referred to the [facility] program for an evaluation so we can better serve you! A frequently asked questions sheet is available upon request.

## 2014-11-17 NOTE — Progress Notes (Signed)
Patient tolerated treatment well.

## 2014-11-17 NOTE — Progress Notes (Signed)
[facility]  OFFICE PROGRESS NOTE  [name], MD [address] [place] [address]  DIAGNOSIS: Peritoneal carcinoma - Plan: CBC with Differential, Comprehensive metabolic panel, CA 177  No chief complaint on file.   CURRENT THERAPY: Pleurx drainage of bilateral pleural effusions in the past. Chemotherapy neoadjuvant Li with carboplatin/paclitaxel given on days 1, 8, and 15 every 28 days begun on 09/22/2014.  INTERVAL HISTORY: [name] 79 y.o. 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 She is still living at the [facility]. She is along undergoing physical therapy. She does ambulate with a wheelchair pushing around the room. She denies any worsening shortness of breath. Appetite is better with decreased abdominal swelling. Denies diarrhea, constipation, epistaxis, melena, or hematochezia. She did see the thoracic surgeon yesterday and arrangements have made for removal of her lateral Pleurx catheters on 12/07/2014. Skin rash still persists despite prednisone 10 mg daily and topical Elocon.  MEDICAL HISTORY: Past Medical History  Diagnosis Date  . GERD (gastroesophageal reflux disease)   . Osteoarthritis   . Peripheral edema   . CHF (congestive heart failure), NYHA class IV     diastolic  . Cancer     INTERIM HISTORY: has GERD; CONSTIPATION, CHRONIC; HEMATOCHEZIA; OSTEOARTHRITIS, LOWER LEG; KNEE PAIN; BURSITIS, KNEE; Dysphagia; Malignant pleural effusion; Edema of both legs; Heart failure, diastolic, with acute decompensation; Hyperkalemia; Anemia, unspecified; Peritoneal carcinoma; Rectal bleed; Chest pain; Protein-calorie malnutrition, severe; Dysphagia, pharyngoesophageal phase; Constipation; Acute on chronic congestive heart failure; Acute on chronic  respiratory failure with hypoxia; Ovarian cancer; and Anemia due to chemotherapy on her problem list.     Ovarian cancer   07/25/2014 Pathology Results Left pleural effusion positive for malignant cell consistent with adenocarcinoma.   07/27/2014 - 08/05/2014 Hospital Admission Heart failure with malignant pleural effusion   07/28/2014 Pathology Results Left pleural effusion positive for malignant cells consistent with adenocarcinoma (CK7 pos, CK20 neg, WT-1 pos, ER weakly pos, PR neg, TTF-1 neg, GCDFP-15 neg).  Findings favor gyn/ovarian primary.   08/01/2014 Pathology Results [facility] genomic findings: BRCA2 [id number], STK11 loss, MYC amplification-equivocal, TP53 R273C, MLL2 [id number].  Targeted treatments include Olaparib (BRCA2) and Everolimus/Temsirolimus (STK11).   08/03/2014 Tumor Marker CA 125 [id number]   08/04/2014 Procedure Left pleurX catheter by Dr. VLucianne LeiTright   09/08/2014 - 09/14/2014 Hospital Admission Hospitalized with the following main issues: acute on chronic CHF, acute on chronic respiratory failure with hypoxia, and malignant plueral effusion on right    09/13/2014 Procedure Right PleurX catheter by Dr. BCyndia Bent  09/13/2014 Pathology Results Right pleural effusion positive for malignant adenocarcinoma   09/21/2014 Tumor Marker CA 125 [id number]   09/22/2014 -  Chemotherapy Carboplatin/Paclitaxel days 1, 8, 15 every 28 days.   10/20/2014 Tumor Marker CA 125 [id number]    ALLERGIES:  is allergic to penicillins.  MEDICATIONS: has a current medication list which includes the following prescription(s): dexamethasone, docusate sodium, furosemide, lactulose, metoclopramide, mometasone, ensure clear, oxycodone, pantoprazole, polyethylene glycol, potassium chloride sa, prednisone, and promethazine, and the following Facility-Administered Medications: sodium chloride, heparin lock flush, and sodium chloride.  SURGICAL HISTORY:  Past Surgical History  Procedure Laterality Date  . Cholecystectomy      . Cardiac catheterization    . Total abdominal hysterectomy  40 years ago    patient does not know if ovaries  were removed  . Appendectomy    . Total knee arthroplasty Left   . Chest tube insertion Left 08/04/2014    Procedure: INSERTION PLEURAL DRAINAGE CATHETER;  Surgeon: Ivin Poot, MD;  Location: [place];  Service: Thoracic;  Laterality: Left;  . Chest tube insertion Right 09/13/2014    Procedure: INSERTION PLEURAL DRAINAGE CATHETER;  Surgeon: Gaye Pollack, MD;  Location: [place];  Service: Thoracic;  Laterality: Right;    FAMILY HISTORY: family history is not on file.  SOCIAL HISTORY:  reports that she has never smoked. She does not have any smokeless tobacco history on file. She reports that she does not drink alcohol or use illicit drugs.  REVIEW OF SYSTEMS:  Other than that discussed above is noncontributory.  PHYSICAL EXAMINATION: ECOG PERFORMANCE STATUS: 1 - Symptomatic but completely ambulatory  There were no vitals taken for this visit.  GENERAL:alert, no distress and comfortable SKIN: skin color, texture, turgor are normal, no rashes or significant lesions EYES: PERLA; Conjunctiva are pink and non-injected, sclera clear SINUSES: No redness or tenderness over maxillary or ethmoid sinuses OROPHARYNX:no exudate, no erythema on lips, buccal mucosa, or tongue. NECK: supple, thyroid normal size, non-tender, without nodularity. No masses CHEST: Pleurx catheters and each hemithorax. No breast masses. LifePort in place. LYMPH:  no palpable lymphadenopathy in the cervical, axillary or inguinal LUNGS: clear to auscultation and percussion with normal breathing effort. No dullness to percussion. HEART: regular rate & rhythm and no murmurs. ABDOMEN:abdomen soft, non-tender and normal bowel sounds. Slightly distended with abdominal masses and fluid wave. MUSCULOSKELETAL:no cyanosis of digits and no clubbing. Range of motion normal.  NEURO: alert & oriented x 3 with fluent speech,  no focal motor/sensory deficits   LABORATORY DATA: Infusion on 11/17/2014  Component Date Value Ref Range Status  . WBC 11/17/2014 5.3  4.0 - 10.5 K/uL Final  . RBC 11/17/2014 3.83* 3.87 - 5.11 MIL/uL Final  . Hemoglobin 11/17/2014 10.2* 12.0 - 15.0 g/dL Final  . HCT 11/17/2014 33.7* 36.0 - 46.0 % Final  . MCV 11/17/2014 88.0  78.0 - 100.0 fL Final  . MCH 11/17/2014 26.6  26.0 - 34.0 pg Final  . MCHC 11/17/2014 30.3  30.0 - 36.0 g/dL Final  . RDW 11/17/2014 20.4* 11.5 - 15.5 % Final  . Platelets 11/17/2014 199  150 - 400 K/uL Final  . Neutrophils Relative % 11/17/2014 78* 43 - 77 % Final  . Neutro Abs 11/17/2014 4.1  1.7 - 7.7 K/uL Final  . Lymphocytes Relative 11/17/2014 12  12 - 46 % Final  . Lymphs Abs 11/17/2014 0.6* 0.7 - 4.0 K/uL Final  . Monocytes Relative 11/17/2014 9  3 - 12 % Final  . Monocytes Absolute 11/17/2014 0.5  0.1 - 1.0 K/uL Final  . Eosinophils Relative 11/17/2014 1  0 - 5 % Final  . Eosinophils Absolute 11/17/2014 0.1  0.0 - 0.7 K/uL Final  . Basophils Relative 11/17/2014 0  0 - 1 % Final  . Basophils Absolute 11/17/2014 0.0  0.0 - 0.1 K/uL Final  Infusion on 11/03/2014  Component Date Value Ref Range Status  . WBC 11/03/2014 4.4  4.0 - 10.5 K/uL Final  . RBC 11/03/2014 3.55* 3.87 - 5.11 MIL/uL Final  . Hemoglobin 11/03/2014 9.6* 12.0 - 15.0 g/dL Final  . HCT 11/03/2014 30.9* 36.0 - 46.0 % Final  . MCV 11/03/2014 87.0  78.0 - 100.0 fL Final  . MCH 11/03/2014 27.0  26.0 - 34.0 pg Final  . MCHC  11/03/2014 31.1  30.0 - 36.0 g/dL Final  . RDW 11/03/2014 20.8* 11.5 - 15.5 % Final  . Platelets 11/03/2014 200  150 - 400 K/uL Final  . Neutrophils Relative % 11/03/2014 75  43 - 77 % Final  . Neutro Abs 11/03/2014 3.4  1.7 - 7.7 K/uL Final  . Lymphocytes Relative 11/03/2014 19  12 - 46 % Final  . Lymphs Abs 11/03/2014 0.8  0.7 - 4.0 K/uL Final  . Monocytes Relative 11/03/2014 3  3 - 12 % Final  . Monocytes Absolute 11/03/2014 0.1  0.1 - 1.0 K/uL Final  .  Eosinophils Relative 11/03/2014 2  0 - 5 % Final  . Eosinophils Absolute 11/03/2014 0.1  0.0 - 0.7 K/uL Final  . Basophils Relative 11/03/2014 1  0 - 1 % Final  . Basophils Absolute 11/03/2014 0.0  0.0 - 0.1 K/uL Final  . Sodium 11/03/2014 139  137 - 147 mEq/L Final  . Potassium 11/03/2014 3.7  3.7 - 5.3 mEq/L Final  . Chloride 11/03/2014 101  96 - 112 mEq/L Final  . CO2 11/03/2014 27  19 - 32 mEq/L Final  . Glucose, Bld 11/03/2014 119* 70 - 99 mg/dL Final  . BUN 11/03/2014 31* 6 - 23 mg/dL Final  . Creatinine, Ser 11/03/2014 0.60  0.50 - 1.10 mg/dL Final  . Calcium 11/03/2014 9.1  8.4 - 10.5 mg/dL Final  . Total Protein 11/03/2014 7.1  6.0 - 8.3 g/dL Final  . Albumin 11/03/2014 3.2* 3.5 - 5.2 g/dL Final  . AST 11/03/2014 14  0 - 37 U/L Final  . ALT 11/03/2014 7  0 - 35 U/L Final  . Alkaline Phosphatase 11/03/2014 62  39 - 117 U/L Final  . Total Bilirubin 11/03/2014 0.3  0.3 - 1.2 mg/dL Final  . GFR calc non Af Amer 11/03/2014 86* >90 mL/min Final  . GFR calc Af Amer 11/03/2014 >90  >90 mL/min Final   Comment: (NOTE) The eGFR has been calculated using the CKD EPI equation. This calculation has not been validated in all clinical situations. eGFR's persistently <90 mL/min signify possible Chronic Kidney Disease.   . Anion gap 11/03/2014 11  5 - 15 Final  Infusion on 10/26/2014  Component Date Value Ref Range Status  . WBC 10/26/2014 5.7  4.0 - 10.5 K/uL Final  . RBC 10/26/2014 3.76* 3.87 - 5.11 MIL/uL Final  . Hemoglobin 10/26/2014 10.1* 12.0 - 15.0 g/dL Final  . HCT 10/26/2014 32.5* 36.0 - 46.0 % Final  . MCV 10/26/2014 86.4  78.0 - 100.0 fL Final  . MCH 10/26/2014 26.9  26.0 - 34.0 pg Final  . MCHC 10/26/2014 31.1  30.0 - 36.0 g/dL Final  . RDW 10/26/2014 20.0* 11.5 - 15.5 % Final  . Platelets 10/26/2014 170  150 - 400 K/uL Final  . Neutrophils Relative % 10/26/2014 84* 43 - 77 % Final  . Neutro Abs 10/26/2014 4.7  1.7 - 7.7 K/uL Final  . Lymphocytes Relative 10/26/2014 12  12  - 46 % Final  . Lymphs Abs 10/26/2014 0.7  0.7 - 4.0 K/uL Final  . Monocytes Relative 10/26/2014 3  3 - 12 % Final  . Monocytes Absolute 10/26/2014 0.2  0.1 - 1.0 K/uL Final  . Eosinophils Relative 10/26/2014 1  0 - 5 % Final  . Eosinophils Absolute 10/26/2014 0.1  0.0 - 0.7 K/uL Final  . Basophils Relative 10/26/2014 0  0 - 1 % Final  . Basophils Absolute 10/26/2014 0.0  0.0 -  0.1 K/uL Final  Office Visit on 10/20/2014  Component Date Value Ref Range Status  . CA 125 10/20/2014 335* <35 U/mL Final   Comment: (NOTE) This test was performed using the Beckman Coulter chemiluminescent method.  Values obtained from different assay methods cannot be used interchangeably.  CA125 levels , regardless of value, should not be interpreted as absolute evidence of the presence or absence of disease. **Please note change in methodology. If re-baselining is needed, for patients who are being serially tested, please call customer service, within 3 days of collection, to request to add on the appropriate re-baselining test code for the previous methodology, at no charge.** Performed at [facility] on 10/20/2014  Component Date Value Ref Range Status  . WBC 10/20/2014 4.5  4.0 - 10.5 K/uL Final  . RBC 10/20/2014 3.37* 3.87 - 5.11 MIL/uL Final  . Hemoglobin 10/20/2014 8.9* 12.0 - 15.0 g/dL Final  . HCT 10/20/2014 28.7* 36.0 - 46.0 % Final  . MCV 10/20/2014 85.2  78.0 - 100.0 fL Final  . MCH 10/20/2014 26.4  26.0 - 34.0 pg Final  . MCHC 10/20/2014 31.0  30.0 - 36.0 g/dL Final  . RDW 10/20/2014 20.0* 11.5 - 15.5 % Final  . Platelets 10/20/2014 194  150 - 400 K/uL Final  . Neutrophils Relative % 10/20/2014 65  43 - 77 % Final  . Neutro Abs 10/20/2014 3.0  1.7 - 7.7 K/uL Final  . Lymphocytes Relative 10/20/2014 21  12 - 46 % Final  . Lymphs Abs 10/20/2014 0.9  0.7 - 4.0 K/uL Final  . Monocytes Relative 10/20/2014 11  3 - 12 % Final  . Monocytes Absolute 10/20/2014 0.5  0.1 - 1.0  K/uL Final  . Eosinophils Relative 10/20/2014 2  0 - 5 % Final  . Eosinophils Absolute 10/20/2014 0.1  0.0 - 0.7 K/uL Final  . Basophils Relative 10/20/2014 1  0 - 1 % Final  . Basophils Absolute 10/20/2014 0.0  0.0 - 0.1 K/uL Final    PATHOLOGY: Adenocarcinoma.  Urinalysis No results found for: COLORURINE, APPEARANCEUR, LABSPEC, PHURINE, GLUCOSEU, HGBUR, BILIRUBINUR, KETONESUR, PROTEINUR, UROBILINOGEN, NITRITE, LEUKOCYTESUR  RADIOGRAPHIC STUDIES: Dg Chest 2 View  11/16/2014   CLINICAL DATA:  Known bilateral malignant pleural effusions ; Kerley complaining of shortness of breath and mild cough in generalize weakness  EXAM: CHEST  2 VIEW  COMPARISON:  PA and lateral chest of October 05, 2014  FINDINGS: There is a trace right pleural effusion. There is a small left pleural effusion. PleurX catheters remain in place at both lung bases. The volume of pleural fluid is considered less than that seen on the previous studies. There is no alveolar infiltrate or pneumothorax. The heart is normal in size. The pulmonary vascularity is not engorged. The power port appliance catheter tip projects over the midportion of the SVC.  IMPRESSION: There remain small bilateral pleural effusions which are much smaller than on the previous studies. The PleurX catheters remain present bilaterally.   Electronically Signed   By: David  Martinique   On: 11/16/2014 10:15    ASSESSMENT:  1. Primary peritoneal carcinomatosis with malignant left and right pleural effusion, status post B/L PleurX catheter insertion. BRCA2 is positive on tumor cells present in the pleural fluid making the patient a candidate for Olaparib as second line treatment should current regimen become ineffective or too toxic. Began Carboplatin/Paclitaxel on 09/22/2014. On day 8 cycle 3 Abraxane will be substituted for Taxol. 2.History of adenomatous polyps in  November of 2010.  3. Status post hysterectomy and bilateral salpingo-oophorectomy.  4. Status  post cholecystectomy  5. Chemotherapy-induced anemia, on Aranesp 300 mcg 6. Rash, improved on Elocon and PO Prednisone. ?Cremophor-induced rash?   PLAN:  #1. Carboplatin Taxol IV today #2. Will attempt to get Abraxane instead of Taxol in an effort to prevent further skin rash which is likely due to Cremaphor. #3. Continue prednisone 10 mg daily and Elocon cream topically. #4. Office visit one week when Abraxane/carboplatin will be given instead of Taxol/carboplatin.   All questions were answered. The patient knows to call the clinic with any problems, questions or concerns. We can certainly see the patient much sooner if necessary.   I spent 30 minutes counseling the patient face to face. The total time spent in the appointment was 40 minutes.    Doroteo Bradford, MD 11/17/2014 10:29 AM  DISCLAIMER:  This note was dictated with voice recognition software.  Similar sounding words can inadvertently be transcribed inaccurately and may not be corrected upon review.

## 2014-11-17 NOTE — Progress Notes (Signed)
Pt does not meet parameters for medication.

## 2014-11-18 LAB — CA 125: CA 125: 76 U/mL — ABNORMAL HIGH (ref <35)

## 2014-11-21 ENCOUNTER — Encounter: Payer: Self-pay | Admitting: *Deleted

## 2014-11-21 NOTE — Patient Instructions (Addendum)
[place] [facility]   CHEMOTHERAPY INSTRUCTIONS  We are changing your chemotherapy regimen from Taxol and Carboplatin to Abraxane and Carboplatin. The only new drug is the Abraxane. Teaching will focus on the Abraxane and its side effects. We are hoping that by changing your medication from Taxol to Abraxane that your skin rash will improve and/or to prevent further skin rash. Everything else with your treatment plan will remain the same. Continue with your current nausea medications and let us know if your nausea gets worse.   Abraxane - Side Effects - myelosuppression (bone marrow suppression - lowers white blood cells, red blood cells, and platelets), sensory neuropathy (unusual sensations such as pins-and-needles, pricks, tingling and numbness), muscle and joint aches/pain, nausea/vomiting, diarrhea, mucositis, hair loss    EDUCATIONAL MATERIALS GIVEN AND REVIEWED: Specific Instructions Sheets: Abraxane   SELF CARE ACTIVITIES WHILE ON CHEMOTHERAPY: Increase your fluid intake 48 hours prior to treatment and drink at least 2 quarts per day after treatment., No alcohol intake., No aspirin or other medications unless approved by your oncologist., Eat foods that are light and easy to digest., Eat foods at cold or room temperature., No fried, fatty, or spicy foods immediately before or after treatment., Have teeth cleaned professionally before starting treatment. Keep dentures and partial plates clean., Use soft toothbrush and do not use mouthwashes that contain alcohol. Biotene is a good mouthwash that is available at most pharmacies or may be ordered by calling [phone]., Use warm salt water gargles (1 teaspoon salt per 1 quart warm water) before and after meals and at bedtime. Or you may rinse with 2 tablespoons of three -percent hydrogen peroxide mixed in eight ounces of water., Always use sunscreen with SPF (Sun Protection Factor) of 30 or higher., Use your nausea  medication as directed to prevent nausea., Use your stool softener or laxative as directed to prevent constipation. and Use your anti-diarrheal medication as directed to stop diarrhea.  Please wash your hands for at least 30 seconds using warm soapy water. Handwashing is the #1 way to prevent the spread of germs. Stay away from sick people or people who are getting over a cold. If you develop respiratory systems such as green/yellow mucus production or productive cough or persistent cough let us know and we will see if you need an antibiotic. It is a good idea to keep a pair of gloves on when going into grocery stores/[employer] to decrease your risk of coming into contact with germs on the carts, etc. Carry alcohol hand gel with you at all times and use it frequently if out in public. All foods need to be cooked thoroughly. No raw foods. No medium or undercooked meats, eggs. If your food is cooked medium well, it does not need to be hot pink or saturated with bloody liquid at all. Vegetables and fruits need to be washed/rinsed under the faucet with a dish detergent before being consumed. You can eat raw fruits and vegetables unless we tell you otherwise but it would be best if you cooked them or bought frozen. Do not eat off of salad bars or hot bars unless you really trust the cleanliness of the restaurant. If you need dental work, please let Dr. Whitney Muse know before you go for your appointment so that we can coordinate the best possible time for you in regards to your chemo regimen. You need to also let your dentist know that you are actively taking chemo. We may need to do  labs prior to your dental appointment. We also want your bowels moving at least every other day. If this is not happening, we need to know so that we can get you on a bowel regimen to help you go.      MEDICATIONS: You have been given prescriptions for the following medications:  Over-the-Counter Meds:   Senna - this is a mild laxative  used to treat mild constipation. May take 4 tabs by mouth daily or up to twice a day (a total of 8 tablets) as needed for mild constipation.  Milk of Magnesia - this is a laxative used to treat moderate to severe constipation. May take 2-4 tablespoons every 8 hours as needed. May increase to 8 tablespoons x 1 dose and if no bowel movement call the [facility].  Imodium - this is for diarrhea. Take 2 tabs after 1st loose stool and then 1 tab every 2 hours until you go a total of 12 hours without a loose stool. Call [facility] if loose stools continue.   SYMPTOMS TO REPORT AS SOON AS POSSIBLE AFTER TREATMENT:  FEVER GREATER THAN 100.5 F  CHILLS WITH OR WITHOUT FEVER  NAUSEA AND VOMITING THAT IS NOT CONTROLLED WITH YOUR NAUSEA MEDICATION  UNUSUAL SHORTNESS OF BREATH  UNUSUAL BRUISING OR BLEEDING  TENDERNESS IN MOUTH AND THROAT WITH OR WITHOUT PRESENCE OF ULCERS  URINARY PROBLEMS  BOWEL PROBLEMS  UNUSUAL RASH    Wear comfortable clothing and clothing appropriate for easy access to any Portacath or PICC line. Let us know if there is anything that we can do to make your therapy better!      I have been informed and understand all of the instructions given to me and have received a copy. I have been instructed to call the clinic [phone] or my family physician as soon as possible for continued medical care, if indicated. I do not have any more questions at this time but understand that I may call the [facility] or the Patient Navigator at [phone] during office hours should I have questions or need assistance in obtaining follow-up care.          Nanoparticle Albumin-Bound Paclitaxel injection What is this medicine? NANOPARTICLE ALBUMIN-BOUND PACLITAXEL (Na no PAHR ti kuhl al BYOO muhn-bound PAK li TAX el) is a chemotherapy drug. It targets fast dividing cells, like cancer cells, and causes these cells to die. This medicine is used to treat advanced breast  cancer and advanced lung cancer. This medicine may be used for other purposes; ask your health care provider or pharmacist if you have questions. COMMON BRAND NAME(S): Abraxane What should I tell my health care provider before I take this medicine? They need to know if you have any of these conditions: -kidney disease -liver disease -low blood counts, like low platelets, red blood cells, or white blood cells -recent or ongoing radiation therapy -an unusual or allergic reaction to paclitaxel, albumin, other chemotherapy, other medicines, foods, dyes, or preservatives -pregnant or trying to get pregnant -breast-feeding How should I use this medicine? This drug is given as an infusion into a vein. It is administered in a hospital or clinic by a specially trained health care professional. Talk to your pediatrician regarding the use of this medicine in children. Special care may be needed. Overdosage: If you think you have taken too much of this medicine contact a poison control center or emergency room at once. NOTE: This medicine is only for you. Do not  share this medicine with others. What if I miss a dose? It is important not to miss your dose. Call your doctor or health care professional if you are unable to keep an appointment. What may interact with this medicine? -cyclosporine -diazepam -ketoconazole -medicines to increase blood counts like filgrastim, pegfilgrastim, sargramostim -other chemotherapy drugs like cisplatin, doxorubicin, epirubicin, etoposide, teniposide, vincristine -quinidine -testosterone -vaccines -verapamil Talk to your doctor or health care professional before taking any of these medicines: -acetaminophen -aspirin -ibuprofen -ketoprofen -naproxen This list may not describe all possible interactions. Give your health care provider a list of all the medicines, herbs, non-prescription drugs, or dietary supplements you use. Also tell them if you smoke, drink  alcohol, or use illegal drugs. Some items may interact with your medicine. What should I watch for while using this medicine? Your condition will be monitored carefully while you are receiving this medicine. You will need important blood work done while you are taking this medicine. This drug may make you feel generally unwell. This is not uncommon, as chemotherapy can affect healthy cells as well as cancer cells. Report any side effects. Continue your course of treatment even though you feel ill unless your doctor tells you to stop. In some cases, you may be given additional medicines to help with side effects. Follow all directions for their use. Call your doctor or health care professional for advice if you get a fever, chills or sore throat, or other symptoms of a cold or flu. Do not treat yourself. This drug decreases your body's ability to fight infections. Try to avoid being around people who are sick. This medicine may increase your risk to bruise or bleed. Call your doctor or health care professional if you notice any unusual bleeding. Be careful brushing and flossing your teeth or using a toothpick because you may get an infection or bleed more easily. If you have any dental work done, tell your dentist you are receiving this medicine. Avoid taking products that contain aspirin, acetaminophen, ibuprofen, naproxen, or ketoprofen unless instructed by your doctor. These medicines may hide a fever. Do not become pregnant while taking this medicine. Women should inform their doctor if they wish to become pregnant or think they might be pregnant. There is a potential for serious side effects to an unborn child. Talk to your health care professional or pharmacist for more information. Do not breast-feed an infant while taking this medicine. Men are advised not to father a child while receiving this medicine. What side effects may I notice from receiving this medicine? Side effects that you should  report to your doctor or health care professional as soon as possible: -allergic reactions like skin rash, itching or hives, swelling of the face, lips, or tongue -low blood counts - This drug may decrease the number of white blood cells, red blood cells and platelets. You may be at increased risk for infections and bleeding. -signs of infection - fever or chills, cough, sore throat, pain or difficulty passing urine -signs of decreased platelets or bleeding - bruising, pinpoint red spots on the skin, black, tarry stools, nosebleeds -signs of decreased red blood cells - unusually weak or tired, fainting spells, lightheadedness -breathing problems -changes in vision -chest pain -high or low blood pressure -mouth sores -nausea and vomiting -pain, swelling, redness or irritation at the injection site -pain, tingling, numbness in the hands or feet -slow or irregular heartbeat -swelling of the ankle, feet, hands Side effects that usually do not require medical attention (  report to your doctor or health care professional if they continue or are bothersome): -aches, pains -changes in the color of fingernails -diarrhea -hair loss -loss of appetite This list may not describe all possible side effects. Call your doctor for medical advice about side effects. You may report side effects to FDA at 1-800-FDA-1088. Where should I keep my medicine? This drug is given in a hospital or clinic and will not be stored at home. NOTE: This sheet is a summary. It may not cover all possible information. If you have questions about this medicine, talk to your doctor, pharmacist, or health care provider.  2015, Elsevier/Gold Standard. (2013-01-11 16:48:50)

## 2014-11-21 NOTE — Progress Notes (Signed)
Patient ID: [name], female   DOB: [birth year], 79 y.o.   MRN: [id number] [name], R.N. From the [facility] called to report that the right and left lung pleurX drains had been drained today with 0 cc result from both.

## 2014-11-22 ENCOUNTER — Other Ambulatory Visit (HOSPITAL_COMMUNITY): Payer: Self-pay | Admitting: Oncology

## 2014-11-22 NOTE — Progress Notes (Signed)
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 BRCA2 is positive on tumor cells present in the pleural fluid making the patient a candidate for Olaparib as second line treatment should current regimen become ineffective or too toxic. Began Carboplatin/Paclitaxel on 09/22/2014. On day 8 cycle 3 Abraxane will be substituted for Taxol.  INTERVAL HISTORY: [name] 79 y.o. female returns for chemotherapy for her peritoneal carcinomatosis.  She complains of constipation and states the stool softeners they give at the nursing home are not working. They also tried MOM last pm with no relief. The rash is improved on her arms. She is receiving Abraxane today as it is felt her rash was from the Taxol preparation. She is having her pleurex catheters out on 12/06/2013. She is eating better. Still has occasional abdominal pain.  Past Medical History  Diagnosis Date  . GERD (gastroesophageal reflux disease)   . Osteoarthritis   . Peripheral edema   . CHF (congestive heart failure), NYHA class IV     diastolic  . Cancer     has GERD; CONSTIPATION, CHRONIC; HEMATOCHEZIA; OSTEOARTHRITIS, LOWER LEG; KNEE PAIN; BURSITIS, KNEE; Dysphagia; Malignant pleural effusion; Edema of both legs; Heart failure, diastolic, with acute decompensation; Hyperkalemia; Anemia, unspecified; Peritoneal carcinoma; Rectal bleed; Chest pain; Protein-calorie malnutrition, severe; Dysphagia, pharyngoesophageal phase; Constipation; Acute on chronic congestive heart failure; Acute on chronic respiratory failure with hypoxia; Ovarian cancer; and Anemia due to chemotherapy on her problem list.      Ovarian cancer   07/25/2014 Pathology Results Left pleural effusion  positive for malignant cell consistent with adenocarcinoma.   07/27/2014 - 08/05/2014 Hospital Admission Heart failure with malignant pleural effusion   07/28/2014 Pathology Results Left pleural effusion positive for malignant cells consistent with adenocarcinoma (CK7 pos, CK20 neg, WT-1 pos, ER weakly pos, PR neg, TTF-1 neg, GCDFP-15 neg).  Findings favor gyn/ovarian primary.   08/01/2014 Pathology Results [facility] genomic findings: BRCA2 [id number]*31, STK11 loss, MYC amplification-equivocal, TP53 R273C, MLL2 QX7741.  Targeted treatments include Olaparib (BRCA2) and Everolimus/Temsirolimus (STK11).   08/03/2014 Tumor Marker CA 125 4954   08/04/2014 Procedure Left pleurX catheter by Dr. VLucianne LeiTright   09/08/2014 - 09/14/2014 Hospital Admission Hospitalized with the following main issues: acute on chronic CHF, acute on chronic respiratory failure with hypoxia, and malignant plueral effusion on right    09/13/2014 Procedure Right PleurX catheter by Dr. BCyndia Bent  09/13/2014 Pathology Results Right pleural effusion positive for malignant adenocarcinoma   09/21/2014 Tumor Marker CA 125 [id number]   09/22/2014 -  Chemotherapy Carboplatin/Paclitaxel days 1, 8, 15 every 28 days.   10/20/2014 Tumor Marker CA 125 335     is allergic to penicillins.  Ms. BEmmerichdoes not currently have medications on file.  Past Surgical History  Procedure Laterality Date  . Cholecystectomy    . Cardiac catheterization    . Total abdominal hysterectomy  40 years ago    patient does not know if ovaries were removed  . Appendectomy    . Total knee arthroplasty Left   . Chest tube insertion Left 08/04/2014    Procedure: INSERTION PLEURAL DRAINAGE CATHETER;  Surgeon: PIvin Poot MD;  [facility]  Service: Thoracic;  Laterality: Left;  . Chest tube insertion Right 09/13/2014    Procedure: INSERTION  PLEURAL DRAINAGE CATHETER;  Surgeon: Gaye Pollack, MD;  Location: [facility];  Service: Thoracic;  Laterality: Right;     Review of Systems  Constitutional: Positive for malaise/fatigue. Negative for fever and chills.  HENT: Negative.   Eyes: Negative.   Respiratory: Negative.   Cardiovascular: Negative.   Gastrointestinal: Positive for abdominal pain and constipation. Negative for heartburn, nausea, vomiting, diarrhea, blood in stool and melena.  Genitourinary: Positive for frequency. Negative for dysuria, urgency, hematuria and flank pain.  Musculoskeletal: Positive for back pain. Negative for myalgias, joint pain, falls and neck pain.  Skin: Positive for itching and rash.  Neurological: Negative.   Endo/Heme/Allergies: Negative.   Psychiatric/Behavioral: Negative.     PHYSICAL EXAMINATION  ECOG PERFORMANCE STATUS: 1 - Symptomatic but completely ambulatory (patient uses wheelchair but is in it and moves around on her own.)  There were no vitals filed for this visit.  Physical Exam  Constitutional: She is oriented to person, place, and time. No distress.  Elderly pleasant somewhat frail appearing female  HENT:  Head: Normocephalic.  Mouth/Throat: Oropharynx is clear and moist. No oropharyngeal exudate.  Eyes: EOM are normal. Pupils are equal, round, and reactive to light. No scleral icterus.  Neck: Normal range of motion. Neck supple. No JVD present. No thyromegaly present.  Cardiovascular: Normal rate and regular rhythm.   No murmur heard. Pulmonary/Chest: Effort normal. No respiratory distress. She has no wheezes. She has no rales.  Bilateral pleurex catheters noted. BS decreased at the bases.  Abdominal: Soft. Bowel sounds are normal. She exhibits no distension and no mass. There is no tenderness. There is no rebound and no guarding.  Musculoskeletal: She exhibits no edema or tenderness.  Lymphadenopathy:    She has no cervical adenopathy.  Neurological: She is alert and oriented to person, place, and time. She displays normal reflexes.  Skin: Skin is warm and dry. Rash noted.   Psychiatric: Mood, memory, affect and judgment normal.    LABORATORY DATA: CBC    Component Value Date/Time   WBC 5.4 11/23/2014 1030   RBC 3.60* 11/23/2014 1030   RBC 4.29 07/27/2014 2257   HGB 9.7* 11/23/2014 1030   HCT 31.7* 11/23/2014 1030   PLT 162 11/23/2014 1030   MCV 88.1 11/23/2014 1030   MCH 26.9 11/23/2014 1030   MCHC 30.6 11/23/2014 1030   RDW 19.4* 11/23/2014 1030   LYMPHSABS 0.9 11/23/2014 1030   MONOABS 0.3 11/23/2014 1030   EOSABS 0.1 11/23/2014 1030   BASOSABS 0.0 11/23/2014 1030    CMP     Component Value Date/Time   NA 142 11/17/2014 0959   K 3.8 11/17/2014 0959   CL 101 11/17/2014 0959   CO2 29 11/17/2014 0959   GLUCOSE 136* 11/17/2014 0959   BUN 34* 11/17/2014 0959   CREATININE 0.73 11/17/2014 0959   CALCIUM 9.4 11/17/2014 0959   PROT 6.9 11/17/2014 0959   ALBUMIN 3.3* 11/17/2014 0959   AST 11 11/17/2014 0959   ALT 8 11/17/2014 0959   ALKPHOS 62 11/17/2014 0959   BILITOT 0.3 11/17/2014 0959   GFRNONAA 80* 11/17/2014 0959   GFRAA >90 11/17/2014 0959     ASSESSMENT and THERAPY PLAN:   Malignant pleural effusion She is breathing more comfortably.  Her CA-125 is improving on therapy.  Last CXR was improved and she describes minimal output via her catheters.  Plan is for removal 12/06/2013.    Peritoneal carcinoma 79 year old with peritoneal carcinoma/ ovarian carcinoma on abraxane/carboplatin with suprisingly good  tolerance.  Her major complaint is constipation and I have written for the addition of a stimulant laxative to her current bowel regimen. CA-125 is improving. I would continue with her current therapy.  I will see her back prior to her next cycle with labs.  If she has problems in the interim she and her son were advised to call.    All questions were answered. The patient knows to call the clinic with any problems, questions or concerns. We can certainly see the patient much sooner if necessary.  Molli Hazard 11/28/2014

## 2014-11-23 ENCOUNTER — Encounter (HOSPITAL_COMMUNITY): Payer: Medicare HMO

## 2014-11-23 ENCOUNTER — Telehealth (HOSPITAL_COMMUNITY): Payer: Self-pay | Admitting: Hematology & Oncology

## 2014-11-23 ENCOUNTER — Encounter (HOSPITAL_BASED_OUTPATIENT_CLINIC_OR_DEPARTMENT_OTHER): Payer: Medicare HMO | Admitting: Hematology & Oncology

## 2014-11-23 ENCOUNTER — Encounter (HOSPITAL_BASED_OUTPATIENT_CLINIC_OR_DEPARTMENT_OTHER): Payer: Medicare HMO

## 2014-11-23 DIAGNOSIS — C569 Malignant neoplasm of unspecified ovary: Secondary | ICD-10-CM

## 2014-11-23 DIAGNOSIS — Z5111 Encounter for antineoplastic chemotherapy: Secondary | ICD-10-CM

## 2014-11-23 DIAGNOSIS — C482 Malignant neoplasm of peritoneum, unspecified: Secondary | ICD-10-CM | POA: Diagnosis not present

## 2014-11-23 DIAGNOSIS — J910 Malignant pleural effusion: Secondary | ICD-10-CM

## 2014-11-23 LAB — CBC WITH DIFFERENTIAL/PLATELET
BASOS ABS: 0 10*3/uL (ref 0.0–0.1)
Basophils Relative: 0 % (ref 0–1)
Eosinophils Absolute: 0.1 10*3/uL (ref 0.0–0.7)
Eosinophils Relative: 1 % (ref 0–5)
HEMATOCRIT: 31.7 % — AB (ref 36.0–46.0)
Hemoglobin: 9.7 g/dL — ABNORMAL LOW (ref 12.0–15.0)
LYMPHS ABS: 0.9 10*3/uL (ref 0.7–4.0)
Lymphocytes Relative: 17 % (ref 12–46)
MCH: 26.9 pg (ref 26.0–34.0)
MCHC: 30.6 g/dL (ref 30.0–36.0)
MCV: 88.1 fL (ref 78.0–100.0)
Monocytes Absolute: 0.3 10*3/uL (ref 0.1–1.0)
Monocytes Relative: 5 % (ref 3–12)
NEUTROS ABS: 4.2 10*3/uL (ref 1.7–7.7)
Neutrophils Relative %: 77 % (ref 43–77)
PLATELETS: 162 10*3/uL (ref 150–400)
RBC: 3.6 MIL/uL — ABNORMAL LOW (ref 3.87–5.11)
RDW: 19.4 % — AB (ref 11.5–15.5)
WBC: 5.4 10*3/uL (ref 4.0–10.5)

## 2014-11-23 MED ORDER — SODIUM CHLORIDE 0.9 % IV SOLN
Freq: Once | INTRAVENOUS | Status: AC
  Administered 2014-11-23: 11:00:00 via INTRAVENOUS

## 2014-11-23 MED ORDER — FAMOTIDINE IN NACL 20-0.9 MG/50ML-% IV SOLN
20.0000 mg | Freq: Once | INTRAVENOUS | Status: AC
Start: 2014-11-23 — End: 2014-11-23
  Administered 2014-11-23: 20 mg via INTRAVENOUS
  Filled 2014-11-23: qty 50

## 2014-11-23 MED ORDER — HEPARIN SOD (PORK) LOCK FLUSH 100 UNIT/ML IV SOLN
500.0000 [IU] | Freq: Once | INTRAVENOUS | Status: AC | PRN
  Administered 2014-11-23: 500 [IU]
  Filled 2014-11-23: qty 5

## 2014-11-23 MED ORDER — PACLITAXEL PROTEIN-BOUND CHEMO INJECTION 100 MG
80.0000 mg/m2 | Freq: Once | INTRAVENOUS | Status: AC
  Administered 2014-11-23: 125 mg via INTRAVENOUS
  Filled 2014-11-23: qty 25

## 2014-11-23 MED ORDER — PALONOSETRON HCL INJECTION 0.25 MG/5ML
0.2500 mg | Freq: Once | INTRAVENOUS | Status: AC
  Administered 2014-11-23: 0.25 mg via INTRAVENOUS
  Filled 2014-11-23: qty 5

## 2014-11-23 MED ORDER — DIPHENHYDRAMINE HCL 50 MG/ML IJ SOLN
50.0000 mg | Freq: Once | INTRAMUSCULAR | Status: AC
Start: 2014-11-23 — End: 2014-11-23
  Administered 2014-11-23: 50 mg via INTRAVENOUS
  Filled 2014-11-23: qty 1

## 2014-11-23 MED ORDER — SODIUM CHLORIDE 0.9 % IV SOLN
145.0000 mg | Freq: Once | INTRAVENOUS | Status: AC
  Administered 2014-11-23: 150 mg via INTRAVENOUS
  Filled 2014-11-23: qty 15

## 2014-11-23 MED ORDER — PACLITAXEL PROTEIN-BOUND CHEMO INJECTION 100 MG: 80.0000 mg/m2 | Freq: Once | INTRAVENOUS | Status: DC

## 2014-11-23 MED ORDER — FOSAPREPITANT DIMEGLUMINE INJECTION 150 MG
Freq: Once | INTRAVENOUS | Status: AC
  Administered 2014-11-23: 12:00:00 via INTRAVENOUS
  Filled 2014-11-23: qty 5

## 2014-11-23 MED ORDER — SODIUM CHLORIDE 0.9 % IJ SOLN: 10.0000 mL | INTRAMUSCULAR | Status: DC | PRN

## 2014-11-23 NOTE — Patient Instructions (Signed)
[facility] Discharge Instructions for Patients Receiving Chemotherapy  Today you received the following chemotherapy agents Abraxane and carboplatin. Please follow up as scheduled.      If you develop nausea and vomiting, or diarrhea that is not controlled by your medication, call the clinic.  The clinic phone number is [phone]. Office hours are Monday-Friday 8:30am-5:00pm.  BELOW ARE SYMPTOMS THAT SHOULD BE REPORTED IMMEDIATELY:  *FEVER GREATER THAN 101.0 F  *CHILLS WITH OR WITHOUT FEVER  NAUSEA AND VOMITING THAT IS NOT CONTROLLED WITH YOUR NAUSEA MEDICATION  *UNUSUAL SHORTNESS OF BREATH  *UNUSUAL BRUISING OR BLEEDING  TENDERNESS IN MOUTH AND THROAT WITH OR WITHOUT PRESENCE OF ULCERS  *URINARY PROBLEMS  *BOWEL PROBLEMS  UNUSUAL RASH Items with * indicate a potential emergency and should be followed up as soon as possible. If you have an emergency after office hours please contact your primary care physician or go to the nearest emergency department.  Please call the clinic during office hours if you have any questions or concerns.   You may also contact the Patient Navigator at [phone] should you have any questions or need assistance in obtaining follow up care. _____________________________________________________________________ Have you asked about our STAR program?    STAR stands for Survivorship Training and Rehabilitation, and this is a nationally recognized cancer care program that focuses on survivorship and rehabilitation.  Cancer and cancer treatments may cause problems, such as, pain, making you feel tired and keeping you from doing the things that you need or want to do. Cancer rehabilitation can help. Our goal is to reduce these troubling effects and help you have the best quality of life possible.  You may receive a survey from a nurse that asks questions about your current state of health.  Based on the survey results, all eligible  patients will be referred to the [facility] program for an evaluation so we can better serve you! A frequently asked questions sheet is available upon request.

## 2014-11-23 NOTE — Progress Notes (Signed)
Procrit to be held this week and begin 12/28 week per Dr.Penland. Patient tolerated treatment well.

## 2014-11-23 NOTE — Patient Instructions (Addendum)
[place] Discharge Instructions  RECOMMENDATIONS MADE BY THE CONSULTANT AND ANY TEST RESULTS WILL BE SENT TO YOUR REFERRING PHYSICIAN.  I have recommended they add Senokot S to your miralax for your bowels.  If you still do not have a BM in the next day or two please let us know. Call with problems or concerns. We will see you again next week prior to your next cycle of chemotherapy.  Thank you for choosing [facility] to provide your oncology and hematology care.  To afford each patient quality time with our providers, please arrive at least 15 minutes before your scheduled appointment time.  With your help, our goal is to use those 15 minutes to complete the necessary work-up to ensure our physicians have the information they need to help with your evaluation and healthcare recommendations.    Effective January 1st, 2014, we ask that you re-schedule your appointment with our physicians should you arrive 10 or more minutes late for your appointment.  We strive to give you quality time with our providers, and arriving late affects you and other patients whose appointments are after yours.    Again, thank you for choosing [facility].  Our hope is that these requests will decrease the amount of time that you wait before being seen by our physicians.       _____________________________________________________________  Should you have questions after your visit to [facility], please contact our office at [phone] between the hours of 8:30 a.m. and 5:00 p.m.  Voicemails left after 4:30 p.m. will not be returned until the following business day.  For prescription refill requests, have your pharmacy contact our office with your prescription refill request.    [facility] Discharge Instructions for Patients Receiving Chemotherapy  To help prevent nausea and vomiting after your treatment, we encourage you to take your  nausea medication   If you develop nausea and vomiting that is not controlled by your nausea medication, call the clinic.   BELOW ARE SYMPTOMS THAT SHOULD BE REPORTED IMMEDIATELY:  *FEVER GREATER THAN 100.5 F  *CHILLS WITH OR WITHOUT FEVER  NAUSEA AND VOMITING THAT IS NOT CONTROLLED WITH YOUR NAUSEA MEDICATION  *UNUSUAL SHORTNESS OF BREATH  *UNUSUAL BRUISING OR BLEEDING  TENDERNESS IN MOUTH AND THROAT WITH OR WITHOUT PRESENCE OF ULCERS  *URINARY PROBLEMS  *BOWEL PROBLEMS  UNUSUAL RASH Items with * indicate a potential emergency and should be followed up as soon as possible.  Feel free to call the clinic you have any questions or concerns. The clinic phone number is [phone].

## 2014-11-23 NOTE — Telephone Encounter (Signed)
PC TO [employer] [phone] ?'D IF [id number] ABRAXANE REQUIRED AUTH PER AUTO CSR IT DOES NOT CALL REF# [id number]  ARANESP WAS DENIED ON 11/22/14 BECAUSE IT DID NOT FALL UNDER [employer] GUIDELINES. T.KEFALAS CORRECTED DOSAGE AND I WILL FAX BACK IN [facility] [phone]

## 2014-11-28 NOTE — Assessment & Plan Note (Signed)
She is breathing more comfortably.  Her CA-125 is improving on therapy.  Last CXR was improved and she describes minimal output via her catheters.  Plan is for removal 12/06/2013.

## 2014-11-28 NOTE — Assessment & Plan Note (Signed)
79 year old with peritoneal carcinoma/ ovarian carcinoma on abraxane/carboplatin with suprisingly good tolerance.  Her major complaint is constipation and I have written for the addition of a stimulant laxative to her current bowel regimen. CA-125 is improving. I would continue with her current therapy.  I will see her back prior to her next cycle with labs.  If she has problems in the interim she and her son were advised to call.

## 2014-11-29 NOTE — Progress Notes (Signed)
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 BRCA2 is positive on tumor cells present in the pleural fluid making the patient a candidate for Olaparib as second line treatment should current regimen become ineffective or too toxic. Began Carboplatin/Paclitaxel on 09/22/2014. On day 8 cycle 3 Abraxane will be substituted for Taxol.  INTERVAL HISTORY: [name] 79 y.o. female returns for follow-up of her peritoneal carcinomatosis. She is finally moving her bowels. She continues to have LLQ pain, but it is not worse. Pleurex catheters will come out on 12/06/2013. She would like to take off her O2 at some point but right now she feel she still needs it. She is on multiple stool softeners, lactulose, miralax and colace.  MEDICAL HISTORY: Past Medical History  Diagnosis Date  . GERD (gastroesophageal reflux disease)   . Osteoarthritis   . Peripheral edema   . CHF (congestive heart failure), NYHA class IV     diastolic  . Cancer     has GERD; CONSTIPATION, CHRONIC; HEMATOCHEZIA; OSTEOARTHRITIS, LOWER LEG; KNEE PAIN; BURSITIS, KNEE; Dysphagia; Malignant pleural effusion; Edema of both legs; Heart failure, diastolic, with acute decompensation; Hyperkalemia; Anemia, unspecified; Peritoneal carcinoma; Rectal bleed; Chest pain; Protein-calorie malnutrition, severe; Dysphagia, pharyngoesophageal phase; Constipation; Acute on chronic congestive heart failure; Acute on chronic respiratory failure with hypoxia; Ovarian cancer; and Anemia due to chemotherapy on her problem list.      Ovarian cancer   07/25/2014 Pathology Results Left pleural effusion positive for malignant cell consistent with adenocarcinoma.   07/27/2014 - 08/05/2014  Hospital Admission Heart failure with malignant pleural effusion   07/28/2014 Pathology Results Left pleural effusion positive for malignant cells consistent with adenocarcinoma (CK7 pos, CK20 neg, WT-1 pos, ER weakly pos, PR neg, TTF-1 neg, GCDFP-15 neg).  Findings favor gyn/ovarian primary.   08/01/2014 Pathology Results [facility] genomic findings: BRCA2 [id number]*31, STK11 loss, MYC amplification-equivocal, TP53 R273C, MLL2 QP5374.  Targeted treatments include Olaparib (BRCA2) and Everolimus/Temsirolimus (STK11).   08/03/2014 Tumor Marker CA 125 4954   08/04/2014 Procedure Left pleurX catheter by Dr. VLucianne LeiTright   09/08/2014 - 09/14/2014 Hospital Admission Hospitalized with the following main issues: acute on chronic CHF, acute on chronic respiratory failure with hypoxia, and malignant plueral effusion on right    09/13/2014 Procedure Right PleurX catheter by Dr. BCyndia Bent  09/13/2014 Pathology Results Right pleural effusion positive for malignant adenocarcinoma   09/21/2014 Tumor Marker CA 125 3869   09/22/2014 -  Chemotherapy Carboplatin/Paclitaxel days 1, 8, 15 every 28 days.   10/20/2014 Tumor Marker CA 125 335     is allergic to penicillins.  Ms. BShrievesdoes not currently have medications on file.  SURGICAL HISTORY: Past Surgical History  Procedure Laterality Date  . Cholecystectomy    . Cardiac catheterization    . Total abdominal hysterectomy  40 years ago    patient does not know if ovaries were removed  . Appendectomy    . Total knee arthroplasty Left   . Chest tube insertion Left 08/04/2014    Procedure: INSERTION PLEURAL DRAINAGE CATHETER;  Surgeon: PIvin Poot MD;  [facility]  Service: Thoracic;  Laterality: Left;  . Chest tube insertion Right 09/13/2014    Procedure: INSERTION PLEURAL DRAINAGE CATHETER;  Surgeon: BGaye Pollack MD;  Location:  [facility] OR;  Service: Thoracic;  Laterality: Right;    SOCIAL HISTORY: History   Social History  . Marital Status:  Widowed    Spouse Name: N/A    Number of Children: N/A  . Years of Education: N/A   Occupational History  . nurses aid    Social History Main Topics  . Smoking status: Never Smoker   . Smokeless tobacco: Not on file  . Alcohol Use: No  . Drug Use: No  . Sexual Activity: Not on file   Other Topics Concern  . Not on file   Social History Narrative    FAMILY HISTORY: No family history on file.  Review of Systems  Constitutional: Positive for malaise/fatigue. Negative for fever, chills, weight loss and diaphoresis.  HENT: Negative.   Eyes: Negative.   Respiratory: Negative.   Cardiovascular: Negative.   Gastrointestinal: Positive for abdominal pain. Negative for heartburn, nausea, vomiting, diarrhea, constipation, blood in stool and melena.  Genitourinary: Negative.   Musculoskeletal: Positive for joint pain. Negative for myalgias, back pain and neck pain.  Skin: Positive for rash. Negative for itching.       Rash is minimal and on forearms only  Neurological: Negative.  Negative for weakness.  Endo/Heme/Allergies: Negative.   Psychiatric/Behavioral: Negative.     PHYSICAL EXAMINATION  ECOG PERFORMANCE STATUS: 2 - Symptomatic, <50% confined to bed  There were no vitals filed for this visit.  Physical Exam  Constitutional: She is oriented to person, place, and time and well-developed, well-nourished, and in no distress.  Lying in bed, gets up easily during exam  HENT:  Head: Normocephalic and atraumatic.  Mouth/Throat: No oropharyngeal exudate.  Eyes: Conjunctivae and EOM are normal. Pupils are equal, round, and reactive to light. No scleral icterus.  Neck: Normal range of motion. Neck supple. No JVD present. No thyromegaly present.  Cardiovascular: Normal rate and regular rhythm.  Exam reveals no friction rub.   No murmur heard. Pulmonary/Chest: Effort normal and breath sounds normal. No respiratory distress. She has no wheezes. She has no rales.  pleurex  catheters intact, slightly decreased at bases  Abdominal: Soft. Bowel sounds are normal. She exhibits no distension. There is no tenderness. There is no rebound and no guarding.  Musculoskeletal: Normal range of motion.  Lymphadenopathy:    She has no cervical adenopathy.  Neurological: She is alert and oriented to person, place, and time. No cranial nerve deficit. Coordination normal.  Skin: Skin is warm and dry.  Psychiatric: Mood, memory, affect and judgment normal.    LABORATORY DATA:  CBC    Component Value Date/Time   WBC 5.4 11/30/2014 1002   RBC 3.53* 11/30/2014 1002   RBC 4.29 07/27/2014 2257   HGB 9.5* 11/30/2014 1002   HCT 31.0* 11/30/2014 1002   PLT 226 11/30/2014 1002   MCV 87.8 11/30/2014 1002   MCH 26.9 11/30/2014 1002   MCHC 30.6 11/30/2014 1002   RDW 19.9* 11/30/2014 1002   LYMPHSABS 0.8 11/30/2014 1002   MONOABS 0.2 11/30/2014 1002   EOSABS 0.1 11/30/2014 1002   BASOSABS 0.0 11/30/2014 1002   CMP     Component Value Date/Time   NA 139 11/30/2014 1002   K 3.8 11/30/2014 1002   CL 103 11/30/2014 1002   CO2 29 11/30/2014 1002   GLUCOSE 117* 11/30/2014 1002   BUN 23 11/30/2014 1002   CREATININE 0.73 11/30/2014 1002   CALCIUM 8.8 11/30/2014 1002   PROT 6.5 11/30/2014 1002   ALBUMIN 3.4* 11/30/2014 1002  AST 17 11/30/2014 1002   ALT 13 11/30/2014 1002   ALKPHOS 62 11/30/2014 1002   BILITOT 0.3 11/30/2014 1002   GFRNONAA 80* 11/30/2014 1002   GFRAA >90 11/30/2014 1002    ASSESSMENT and THERAPY PLAN:    CONSTIPATION, CHRONIC We had recommended a softener and stimulant for her constipation. She is now on multiple 'softeners' including lactulose, colace; constipation seems to be relieved.  We will continue to monitor this moving forward.  Ovarian cancer 79 year old female with peritoneal carcinomatosis on Carboplatin/Abraxane.  She has had excellent tolerance.  Clinically she is doing fairly well. Abraxane had been substituted for Taxol secondary to  rash.  Her rash on her arms is minimal and causing no significant problems. We will plan on seeing her back prior to her next cycle.  CA-125 continues to decline.  She knows to call with any problems or concerns prior to f/u.    All questions were answered. The patient knows to call the clinic with any problems, questions or concerns. We can certainly see the patient much sooner if necessary.   Molli Hazard 12/03/2014

## 2014-11-30 ENCOUNTER — Encounter (HOSPITAL_COMMUNITY): Payer: Medicare HMO

## 2014-11-30 ENCOUNTER — Encounter (HOSPITAL_BASED_OUTPATIENT_CLINIC_OR_DEPARTMENT_OTHER): Payer: Medicare HMO

## 2014-11-30 ENCOUNTER — Encounter (HOSPITAL_BASED_OUTPATIENT_CLINIC_OR_DEPARTMENT_OTHER): Payer: Medicare HMO | Admitting: Hematology & Oncology

## 2014-11-30 DIAGNOSIS — K5909 Other constipation: Secondary | ICD-10-CM

## 2014-11-30 DIAGNOSIS — Z5111 Encounter for antineoplastic chemotherapy: Secondary | ICD-10-CM

## 2014-11-30 DIAGNOSIS — D6481 Anemia due to antineoplastic chemotherapy: Secondary | ICD-10-CM

## 2014-11-30 DIAGNOSIS — R109 Unspecified abdominal pain: Secondary | ICD-10-CM

## 2014-11-30 DIAGNOSIS — R21 Rash and other nonspecific skin eruption: Secondary | ICD-10-CM

## 2014-11-30 DIAGNOSIS — C569 Malignant neoplasm of unspecified ovary: Secondary | ICD-10-CM

## 2014-11-30 DIAGNOSIS — C482 Malignant neoplasm of peritoneum, unspecified: Secondary | ICD-10-CM

## 2014-11-30 DIAGNOSIS — J910 Malignant pleural effusion: Secondary | ICD-10-CM

## 2014-11-30 DIAGNOSIS — M2550 Pain in unspecified joint: Secondary | ICD-10-CM

## 2014-11-30 DIAGNOSIS — T451X5A Adverse effect of antineoplastic and immunosuppressive drugs, initial encounter: Secondary | ICD-10-CM

## 2014-11-30 LAB — CBC WITH DIFFERENTIAL/PLATELET
BASOS ABS: 0 10*3/uL (ref 0.0–0.1)
Basophils Relative: 0 % (ref 0–1)
Eosinophils Absolute: 0.1 10*3/uL (ref 0.0–0.7)
Eosinophils Relative: 1 % (ref 0–5)
HCT: 31 % — ABNORMAL LOW (ref 36.0–46.0)
Hemoglobin: 9.5 g/dL — ABNORMAL LOW (ref 12.0–15.0)
LYMPHS PCT: 15 % (ref 12–46)
Lymphs Abs: 0.8 10*3/uL (ref 0.7–4.0)
MCH: 26.9 pg (ref 26.0–34.0)
MCHC: 30.6 g/dL (ref 30.0–36.0)
MCV: 87.8 fL (ref 78.0–100.0)
Monocytes Absolute: 0.2 10*3/uL (ref 0.1–1.0)
Monocytes Relative: 3 % (ref 3–12)
NEUTROS ABS: 4.3 10*3/uL (ref 1.7–7.7)
Neutrophils Relative %: 81 % — ABNORMAL HIGH (ref 43–77)
Platelets: 226 10*3/uL (ref 150–400)
RBC: 3.53 MIL/uL — ABNORMAL LOW (ref 3.87–5.11)
RDW: 19.9 % — AB (ref 11.5–15.5)
WBC: 5.4 10*3/uL (ref 4.0–10.5)

## 2014-11-30 LAB — COMPREHENSIVE METABOLIC PANEL
ALBUMIN: 3.4 g/dL — AB (ref 3.5–5.2)
ALT: 13 U/L (ref 0–35)
ANION GAP: 7 (ref 5–15)
AST: 17 U/L (ref 0–37)
Alkaline Phosphatase: 62 U/L (ref 39–117)
BUN: 23 mg/dL (ref 6–23)
CALCIUM: 8.8 mg/dL (ref 8.4–10.5)
CO2: 29 mmol/L (ref 19–32)
CREATININE: 0.73 mg/dL (ref 0.50–1.10)
Chloride: 103 mEq/L (ref 96–112)
GFR calc Af Amer: >90 mL/min (ref >90)
GFR calc non Af Amer: 80 mL/min — ABNORMAL LOW (ref >90)
Glucose, Bld: 117 mg/dL — ABNORMAL HIGH (ref 70–99)
Potassium: 3.8 mmol/L (ref 3.5–5.1)
Sodium: 139 mmol/L (ref 135–145)
Total Bilirubin: 0.3 mg/dL (ref 0.3–1.2)
Total Protein: 6.5 g/dL (ref 6.0–8.3)

## 2014-11-30 MED ORDER — DARBEPOETIN ALFA 100 MCG/0.5ML IJ SOSY
100.0000 ug | PREFILLED_SYRINGE | Freq: Once | INTRAMUSCULAR | Status: DC
  Filled 2014-11-30: qty 0.5

## 2014-11-30 MED ORDER — SODIUM CHLORIDE 0.9 % IV SOLN
145.0000 mg | Freq: Once | INTRAVENOUS | Status: AC
  Administered 2014-11-30: 150 mg via INTRAVENOUS
  Filled 2014-11-30: qty 15

## 2014-11-30 MED ORDER — PALONOSETRON HCL INJECTION 0.25 MG/5ML
INTRAVENOUS | Status: AC
  Filled 2014-11-30: qty 5

## 2014-11-30 MED ORDER — PACLITAXEL PROTEIN-BOUND CHEMO INJECTION 100 MG: 80.0000 mg/m2 | Freq: Once | INTRAVENOUS | Status: DC

## 2014-11-30 MED ORDER — FAMOTIDINE IN NACL 20-0.9 MG/50ML-% IV SOLN
INTRAVENOUS | Status: AC
  Filled 2014-11-30: qty 50

## 2014-11-30 MED ORDER — SODIUM CHLORIDE 0.9 % IV SOLN
Freq: Once | INTRAVENOUS | Status: AC
  Administered 2014-11-30: 11:00:00 via INTRAVENOUS
  Filled 2014-11-30: qty 5

## 2014-11-30 MED ORDER — FAMOTIDINE IN NACL 20-0.9 MG/50ML-% IV SOLN
20.0000 mg | Freq: Once | INTRAVENOUS | Status: AC
  Administered 2014-11-30: 20 mg via INTRAVENOUS

## 2014-11-30 MED ORDER — PALONOSETRON HCL INJECTION 0.25 MG/5ML
0.2500 mg | Freq: Once | INTRAVENOUS | Status: AC
  Administered 2014-11-30: 0.25 mg via INTRAVENOUS

## 2014-11-30 MED ORDER — DIPHENHYDRAMINE HCL 50 MG/ML IJ SOLN
50.0000 mg | Freq: Once | INTRAMUSCULAR | Status: AC
  Administered 2014-11-30: 50 mg via INTRAVENOUS

## 2014-11-30 MED ORDER — HEPARIN SOD (PORK) LOCK FLUSH 100 UNIT/ML IV SOLN
500.0000 [IU] | Freq: Once | INTRAVENOUS | Status: AC | PRN
  Administered 2014-11-30: 500 [IU]
  Filled 2014-11-30: qty 5

## 2014-11-30 MED ORDER — SODIUM CHLORIDE 0.9 % IJ SOLN
10.0000 mL | INTRAMUSCULAR | Status: DC | PRN
  Administered 2014-11-30: 10 mL
  Filled 2014-11-30: qty 10

## 2014-11-30 MED ORDER — DIPHENHYDRAMINE HCL 50 MG/ML IJ SOLN
INTRAMUSCULAR | Status: AC
  Filled 2014-11-30: qty 1

## 2014-11-30 MED ORDER — SODIUM CHLORIDE 0.9 % IV SOLN
Freq: Once | INTRAVENOUS | Status: AC
  Administered 2014-11-30: 11:00:00 via INTRAVENOUS

## 2014-11-30 MED ORDER — PACLITAXEL PROTEIN-BOUND CHEMO INJECTION 100 MG
80.0000 mg/m2 | Freq: Once | INTRAVENOUS | Status: AC
  Administered 2014-11-30: 125 mg via INTRAVENOUS
  Filled 2014-11-30: qty 25

## 2014-11-30 NOTE — Progress Notes (Signed)
Patient was not given asranesp per pa Kefalas request today.  Awaiting insurance approval.  Patient tolerated chemotherapy well.

## 2014-11-30 NOTE — Patient Instructions (Signed)
[facility] Discharge Instructions for Patients Receiving Chemotherapy  Today you received the following chemotherapy agents carboplatin and abraxane.   Please call for any questions or concerns. Follow up as scheduled.    If you develop nausea and vomiting, or diarrhea that is not controlled by your medication, call the clinic.  The clinic phone number is [phone]. Office hours are Monday-Friday 8:30am-5:00pm.  BELOW ARE SYMPTOMS THAT SHOULD BE REPORTED IMMEDIATELY:  *FEVER GREATER THAN 101.0 F  *CHILLS WITH OR WITHOUT FEVER  NAUSEA AND VOMITING THAT IS NOT CONTROLLED WITH YOUR NAUSEA MEDICATION  *UNUSUAL SHORTNESS OF BREATH  *UNUSUAL BRUISING OR BLEEDING  TENDERNESS IN MOUTH AND THROAT WITH OR WITHOUT PRESENCE OF ULCERS  *URINARY PROBLEMS  *BOWEL PROBLEMS  UNUSUAL RASH Items with * indicate a potential emergency and should be followed up as soon as possible. If you have an emergency after office hours please contact your primary care physician or go to the nearest emergency department.  Please call the clinic during office hours if you have any questions or concerns.   You may also contact the Patient Navigator at [phone] should you have any questions or need assistance in obtaining follow up care. _____________________________________________________________________ Have you asked about our STAR program?    STAR stands for Survivorship Training and Rehabilitation, and this is a nationally recognized cancer care program that focuses on survivorship and rehabilitation.  Cancer and cancer treatments may cause problems, such as, pain, making you feel tired and keeping you from doing the things that you need or want to do. Cancer rehabilitation can help. Our goal is to reduce these troubling effects and help you have the best quality of life possible.  You may receive a survey from a nurse that asks questions about your current state of health.   Based on the survey results, all eligible patients will be referred to the [facility] program for an evaluation so we can better serve you! A frequently asked questions sheet is available upon request.

## 2014-11-30 NOTE — Patient Instructions (Signed)
[facility] Discharge Instructions  RECOMMENDATIONS MADE BY THE CONSULTANT AND ANY TEST RESULTS WILL BE SENT TO YOUR REFERRING PHYSICIAN.  Please call with problems prior to your next follow-up Stay on your bowel regimen.  Thank you for choosing [facility] to provide your oncology and hematology care.  To afford each patient quality time with our providers, please arrive at least 15 minutes before your scheduled appointment time.  With your help, our goal is to use those 15 minutes to complete the necessary work-up to ensure our physicians have the information they need to help with your evaluation and healthcare recommendations.    Effective January 1st, 2014, we ask that you re-schedule your appointment with our physicians should you arrive 10 or more minutes late for your appointment.  We strive to give you quality time with our providers, and arriving late affects you and other patients whose appointments are after yours.    Again, thank you for choosing [facility].  Our hope is that these requests will decrease the amount of time that you wait before being seen by our physicians.       _____________________________________________________________  Should you have questions after your visit to [facility], please contact our office at [phone] between the hours of 8:30 a.m. and 5:00 p.m.  Voicemails left after 4:30 p.m. will not be returned until the following business day.  For prescription refill requests, have your pharmacy contact our office with your prescription refill request.    [facility] Discharge Instructions for Patients Receiving Chemotherapy   To help prevent nausea and vomiting after your treatment, we encourage you to take your nausea medication  If you develop nausea and vomiting that is not controlled by your nausea medication, call the clinic.   BELOW ARE SYMPTOMS THAT SHOULD BE REPORTED  IMMEDIATELY:  *FEVER GREATER THAN 100.5 F  *CHILLS WITH OR WITHOUT FEVER  NAUSEA AND VOMITING THAT IS NOT CONTROLLED WITH YOUR NAUSEA MEDICATION  *UNUSUAL SHORTNESS OF BREATH  *UNUSUAL BRUISING OR BLEEDING  TENDERNESS IN MOUTH AND THROAT WITH OR WITHOUT PRESENCE OF ULCERS  *URINARY PROBLEMS  *BOWEL PROBLEMS  UNUSUAL RASH Items with * indicate a potential emergency and should be followed up as soon as possible.  Feel free to call the clinic you have any questions or concerns. The clinic phone number is [phone].

## 2014-12-01 LAB — IRON AND TIBC
Iron: 26 ug/dL — ABNORMAL LOW (ref 42–145)
SATURATION RATIOS: 9 % — AB (ref 20–55)
TIBC: 290 ug/dL (ref 250–470)
UIBC: 264 ug/dL (ref 125–400)

## 2014-12-01 LAB — FERRITIN: Ferritin: 132 ng/mL (ref 10–291)

## 2014-12-03 NOTE — Assessment & Plan Note (Signed)
80 year old female with peritoneal carcinomatosis on Carboplatin/Abraxane.  She has had excellent tolerance.  Clinically she is doing fairly well. Abraxane had been substituted for Taxol secondary to rash.  Her rash on her arms is minimal and causing no significant problems. We will plan on seeing her back prior to her next cycle.  CA-125 continues to decline.  She knows to call with any problems or concerns prior to f/u.

## 2014-12-03 NOTE — Assessment & Plan Note (Signed)
We had recommended a softener and stimulant for her constipation. She is now on multiple 'softeners' including lactulose, colace; constipation seems to be relieved.  We will continue to monitor this moving forward.

## 2014-12-05 NOTE — Progress Notes (Signed)
[place] and spoke with [facility] to inform her that pt does not have to be here tomorrow until 11:00 AM. I apologized for the confusion. She voiced understanding.

## 2014-12-05 NOTE — Progress Notes (Signed)
Pt is a resident at [facility]. Called and spoke with [facility], nurse for pt for pre-op call. She verified pt's allergies, meds (will be faxing MAR) and medical history. She states pt is alert and oriented, able to speak for herself and able to sign consent form. Sherian Rein states pt will be arriving via the transportation of [facility] and her significant other usually rides with her and daughter meets her here. I gave Shamika time of arrival of 9:30 AM, instructed her that pt needs to NPO after MN tonight, medications that pt is to have in AM with a sip of water are Prednisone, Reglan, and Protonix. She voiced understanding. Sherian Rein states she will be calling pt's daughter to give her the surgery information.

## 2014-12-06 ENCOUNTER — Encounter (HOSPITAL_COMMUNITY)
Admission: RE | Disposition: A | Discharge status: Home / Self Care | Payer: Self-pay | Source: Ambulatory Visit | Attending: Cardiothoracic Surgery

## 2014-12-06 ENCOUNTER — Ambulatory Visit (HOSPITAL_COMMUNITY)
Admission: RE | Admit: 2014-12-06 | Discharge: 2014-12-06 | Disposition: A | Discharge status: Home / Self Care | Payer: Medicare HMO | Source: Ambulatory Visit | Attending: Cardiothoracic Surgery | Admitting: Cardiothoracic Surgery

## 2014-12-06 DIAGNOSIS — C7960 Secondary malignant neoplasm of unspecified ovary: Secondary | ICD-10-CM | POA: Insufficient documentation

## 2014-12-06 DIAGNOSIS — J910 Malignant pleural effusion: Secondary | ICD-10-CM

## 2014-12-06 DIAGNOSIS — J90 Pleural effusion, not elsewhere classified: Secondary | ICD-10-CM

## 2014-12-06 HISTORY — PX: REMOVAL OF PLEURAL DRAINAGE CATHETER: SHX5080

## 2014-12-06 SURGERY — REMOVAL, CLOSED DRAINAGE CATHETER SYSTEM, PLEURAL
Anesthesia: Monitor Anesthesia Care | Laterality: Bilateral

## 2014-12-06 MED ORDER — LIDOCAINE-EPINEPHRINE 1 %-1:100000 IJ SOLN
INTRAMUSCULAR | Status: AC
  Filled 2014-12-06: qty 1

## 2014-12-06 MED ORDER — LIDOCAINE HCL 2 % IJ SOLN
INTRAMUSCULAR | Status: AC
  Filled 2014-12-06: qty 20

## 2014-12-06 NOTE — Progress Notes (Signed)
I first attempted to drain both the right and left Pleur X catheters. I was not able to obtain any drainage. Per the patient, it has been several weeks since there has been any drainage out of either side. After discussion with Dr. Prescott Gum, patient was consented for bilateral Pleur X removal. The right side was prepped with Betadine and 1% Xylocaine was used to achieve local anesthetic. The right Pleur x was removed without difficulty. One nylon suture was placed. The left side was then prepped with Betadine and 1% Xylocaine was used to achieve local anesthetic. The Pleur X was more tightly adhered around the cuff than the right side;however, it too was removed. 2 nylon sutures were placed. Of note, the left side was clogged with clot and debris. Patient tolerated both procedures well. She had no difficulty breathing or shortness of breath. On ausculation, she was mostly clear on the right and diminished left base but had audible lung sounds on the left. Patient was instructed she may remove both dressings on 12/07/2014 and shower. Small wounds may be left open to the air. A follow up appointment was arranged to see Dr. Prescott Gum on 12/14/2014 at 12:00 pm. A PA/LAT CXR will be taken prior to this office appointment.

## 2014-12-06 NOTE — Discharge Instructions (Signed)
Written instructions per PA.  F/U doctors appointment 1/13 at 12pm and may remove dressings 1/6 and shower.

## 2014-12-07 ENCOUNTER — Encounter: Payer: Self-pay | Admitting: Internal Medicine

## 2014-12-07 ENCOUNTER — Non-Acute Institutional Stay (SKILLED_NURSING_FACILITY): Payer: Medicare HMO | Admitting: Internal Medicine

## 2014-12-07 DIAGNOSIS — L0390 Cellulitis, unspecified: Secondary | ICD-10-CM | POA: Insufficient documentation

## 2014-12-07 DIAGNOSIS — K219 Gastro-esophageal reflux disease without esophagitis: Secondary | ICD-10-CM

## 2014-12-07 DIAGNOSIS — L03119 Cellulitis of unspecified part of limb: Secondary | ICD-10-CM

## 2014-12-07 DIAGNOSIS — C482 Malignant neoplasm of peritoneum, unspecified: Secondary | ICD-10-CM

## 2014-12-07 DIAGNOSIS — K5909 Other constipation: Secondary | ICD-10-CM

## 2014-12-07 DIAGNOSIS — E43 Unspecified severe protein-calorie malnutrition: Secondary | ICD-10-CM

## 2014-12-07 DIAGNOSIS — I5033 Acute on chronic diastolic (congestive) heart failure: Secondary | ICD-10-CM

## 2014-12-07 DIAGNOSIS — J910 Malignant pleural effusion: Secondary | ICD-10-CM

## 2014-12-07 NOTE — Progress Notes (Signed)
Patient ID: [name], female   DOB: 11-Sep-1935, 80 y.o.   MRN: [id number]   This is an acute-routine visit.  Level care skilled.  Facility [facility].  Chief complaint-medical management of chronic medical issues including peritoneal cancer with effusions-GERD-diastolic CHF-constipation-acute visit secondary to  right leg erythema question cellulitis.  History of present illness.  Patient is a very pleasant 80 year old female with a history of metastatic ovarian cancer-she does have a history of peritoneal carcinoma with malignant effusions bilaterally.  She has had drains previously for the effusions but these were removed earlier this week-clinically she appears stable although very frail.  She is followed by oncology and continues to receive chemotherapy which of course is followed by oncology  She has developed some erythema on her right lower leg-nursing staff is concerned about cellulitis here.  There has been no complaints of fever or chills.  She does have a history of diastolic CHF she had gained about 10 pounds here recently Dr. Dellia Nims did start her on low-dose Lasix this appears to have a beneficial effect weight appears to be stable in the mid 120s.  She does not complain of any acute shortness of breath she is on oxygen as needed-O2 saturations have been satisfactory in the 90s per nursing.  She also has a history of considerable constipation but this appears actually to have improved she is on Colace MiraLAX and lactulose.  She also has significant dysphagia GERD symptoms but this appears to have stabilized on the Reglan protonic.  For pain management she continues on OxyContin and this appears to help as well she appears comfortable today does not complain of pain-actually has looked the best she has in some time.  Family medical social history as been reviewed per previous oncology notes as well as readmission note on 09/19/2014.  Medications have been reviewed  per MAR.  Review of systems.  General does not complain of any fever or chills.  Eyes no complaints of visual changes.  Skin does have some erythema right lower leg as noted above.  Does not complain of any itching does have history of upper extremity rash she is receiving a topical cream apparently with relief.  Head ears eyes nose mouth and throat-does not complain of any sore throat or.  Respiratory does not complain of shortness of breath or cough.  Cardiac does not complain of chest pain has very minimal lower extremity edema this appears.  GU does not complain of dysuria.  GI-does not complaining of acute abdominal pain does have some chronic lower abdominal pain and oncology has evaluated this-again she is on numerous motility agents.  Musculoskeletal does not complain of joint pain or acute pain at this time this appears to be controlled.  Neurologic does not complain of dizziness or headache or syncopal-type feelings.  Psych continues to be in good spirits.  Physical exam.  Temperature is 97.9 pulse 74 respirations 18 blood pressure 114/48-121/69 most recently weight 128.8.  In general this is a frail elderly female in no distress resting in bed comfortably.  Her skin is warm and dry right lower leg there is a small area of erythema in the central part there appears to be a small skin tear with  erythema  approximately 2- 3 cm in diameter. Surrounding it  I also note areas bilaterally--where lower thorax  drains were removed yesterday this appears to be benign appearing stitches are still placed I do not see any erythema drainage or sign of infection  There is no active drainage.  Eyes pupils appear reactive light   oropharynx is clear mucous membranes moist  Chest she has reduced breath sounds bilaterally but I did not see any labored breathing-I cannot appreciate significant congestion.  Heart is regular rate and rhythm without murmur gallop or rub she does  not really have significant lower extremity.  Abdomen is soft some mild tenderness to palpation lower abdomen this is not new-bowel sounds are active.  Musculoskeletal has general frailty but is able to move all extremities 4 I do not see any deformities this appears to be baseline.  Neurologic is grossly intact her speech is clear.  Psych she is alert and oriented doesn't and appropriate which is her baseline.  Labs.  11/30/2014.  Iron 26-total iron-binding capacity to 90-ferritin 132-this was obtained at oncology.  WBC 5.4 hemoglobin 9.5 platelets 226.  Sodium 139 potassium 3.8 BUN 23 creatinine 0.73.  Albumin 3.4-otherwise liver function tests within normal limits.  Assessment plan.  #1-right leg erythema-will start Levaquin 500 mg daily 1 day 250 daily 7 additional days-I suspect this is cellulitic-she appears to have tolerated Levaquin well in the past continue to monitor this was discussed with wound care.  #2 history of peritoneal cancer ovarian with metastasis-she is followed closely by oncology-is receiving chemotherapy-her drains were removed yesterday-appears to have tolerated this well-continue to monitor at this point appears stable   #5-ENIDPOE of diastolic CHF-this appears compensated on low-dose Lasix-oncology appears to be following her labs quite closely recent metabolic panel appear to be fairly unremarkable.  #4-history of anemia chemotherapy induced-t appears recent hemoglobins per chart review have run between 9-10,s-this is followed closely by oncology --she has received Aranesp as needed apparently for hemoglobin below 10  #5-history of dysphagia-GERD-she is on numerous agents including Reglan and Protonix-at this point appears to be effective-again appears to be doing the best she has in some time with this.  She also has constipation but this is stable on current agents including lactulose MiraLAX and Colace.  #7-Painmanagement -- continues on OxyIR  every 4 hours 5-10 milligrams as needed-she appears to be doing quite well with this which is encouraging  #8-history of rash-this appears to be resolved stable-she is on Elocon cream-also on low-dose prednisone-there is some thought this may be chemotherapy medication related as well--per review of oncology note    #9-history of protein calorie malnutrition-most recent albumin is 3.4--which is encouraging    [id number]-of note greater than 35 minutes spent assessing patient-extensive review her chart including oncology notes-and coordinating and formulating a plan of care for numerous diagnoses-of note greater than 50% of time spent coordinating plan of care

## 2014-12-08 ENCOUNTER — Encounter (HOSPITAL_COMMUNITY): Payer: Self-pay | Admitting: Cardiothoracic Surgery

## 2014-12-13 ENCOUNTER — Other Ambulatory Visit: Payer: Self-pay | Admitting: Cardiothoracic Surgery

## 2014-12-13 DIAGNOSIS — J910 Malignant pleural effusion: Secondary | ICD-10-CM

## 2014-12-14 ENCOUNTER — Ambulatory Visit
Admission: RE | Admit: 2014-12-14 | Discharge: 2014-12-14 | Disposition: A | Discharge status: Home / Self Care | Payer: Medicare HMO | Source: Ambulatory Visit | Attending: Cardiothoracic Surgery | Admitting: Cardiothoracic Surgery

## 2014-12-14 ENCOUNTER — Encounter: Payer: Self-pay | Admitting: Cardiothoracic Surgery

## 2014-12-14 ENCOUNTER — Non-Acute Institutional Stay (SKILLED_NURSING_FACILITY): Payer: Medicare HMO | Admitting: Internal Medicine

## 2014-12-14 ENCOUNTER — Ambulatory Visit (INDEPENDENT_AMBULATORY_CARE_PROVIDER_SITE_OTHER): Payer: Self-pay | Admitting: Cardiothoracic Surgery

## 2014-12-14 VITALS — BP 117/69 | HR 76 | Resp 20 | Ht 65.0 in | Wt 128.0 lb

## 2014-12-14 DIAGNOSIS — J90 Pleural effusion, not elsewhere classified: Secondary | ICD-10-CM

## 2014-12-14 DIAGNOSIS — L0390 Cellulitis, unspecified: Secondary | ICD-10-CM

## 2014-12-14 DIAGNOSIS — J910 Malignant pleural effusion: Secondary | ICD-10-CM

## 2014-12-14 NOTE — Progress Notes (Signed)
PCP is [name], MD Referring Provider is [name], MD  Chief Complaint  Patient presents with  . 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 She is currently on room air. She is receiving chemotherapy and is located at the [facility] facility in [place].  Chest x-ray today shows clear lung fields without recurrent pleural effusion after removal of the Pleurx catheters.  Past Medical History  Diagnosis Date  . GERD (gastroesophageal reflux disease)   . Osteoarthritis   . Peripheral edema   . CHF (congestive heart failure), NYHA class IV     diastolic  . Cancer     Past Surgical History  Procedure Laterality Date  . Cholecystectomy    . Cardiac catheterization    . Total abdominal hysterectomy  40 years ago    patient does not know if ovaries were removed  . Appendectomy    . Total knee arthroplasty Left   . Chest tube insertion Left 08/04/2014    Procedure: INSERTION PLEURAL DRAINAGE CATHETER;  Surgeon: Ivin Poot, MD;  Location: [place];  Service: Thoracic;  Laterality: Left;  . Chest tube insertion Right 09/13/2014    Procedure: INSERTION PLEURAL DRAINAGE CATHETER;  Surgeon: Gaye Pollack, MD;  Location: [place];  Service: Thoracic;  Laterality: Right;  . Removal of pleural drainage catheter Bilateral 12/06/2014    Procedure: REMOVAL OF PLEURAL DRAINAGE CATHETER;  Surgeon: Ivin Poot, MD;  Location: [place];  Service: Thoracic;  Laterality: Bilateral;    No family history on file.  Social History History  Substance Use Topics  . Smoking status: Never Smoker   . Smokeless tobacco: Not on file  . Alcohol Use: No    Current Outpatient Prescriptions  Medication Sig Dispense Refill  . Amino Acids-Protein Hydrolys (FEEDING SUPPLEMENT, PRO-STAT SUGAR FREE  64,) LIQD Take 30 mLs by mouth 3 (three) times daily with meals.    . CARBOPLATIN IV Inject into the vein. Day 1, 8, 15 Every 28 days    . docusate sodium (COLACE) 100 MG capsule Take 100 mg by mouth 2 (two) times daily.    . furosemide (LASIX) 20 MG tablet Take 20 mg by mouth daily.    Marland Kitchen lactulose (CHRONULAC) 10 GM/15ML solution Take 30 mLs (20 g total) by mouth 2 (two) times daily as needed for moderate constipation. 240 mL 0  . metoCLOPramide (REGLAN) 5 MG tablet Take 5 mg by mouth 4 (four) times daily.    . mometasone (ELOCON) 0.1 % cream Apply 1 application topically daily. To affected areas (arms and hands)    . Nutritional Supplements (ENSURE CLEAR) LIQD Take 1 Bottle by mouth 3 (three) times daily.    Marland Kitchen oxyCODONE (ROXICODONE) 5 MG immediate release tablet Take one tablet by mouth every 4 hours as needed for moderate pain or take two tablets by mouth every 4 hours as needed for severe pain (Patient taking differently: Take 5-10 mg by mouth every 4 (four) hours as needed (for pain). Take one tablet by mouth every 4 hours as needed for moderate pain or take two tablets by mouth every 4 hours as needed for severe pain) 360 tablet 0  . PACLitaxel Protein-Bound Part (ABRAXANE IV) Inject into the vein. Day 1, 8, 15 Every 28 days    . pantoprazole (PROTONIX)  40 MG tablet Take 1 tablet (40 mg total) by mouth daily. 30 tablet 0  . polyethylene glycol (MIRALAX / GLYCOLAX) packet Take 17 g by mouth daily. 14 each 0  . potassium chloride SA (K-DUR,KLOR-CON) 20 MEQ tablet Take 20 mEq by mouth 2 (two) times daily.    . predniSONE (DELTASONE) 10 MG tablet Take 1 tablet (10 mg total) by mouth daily with breakfast. 30 tablet 1   No current facility-administered medications for this visit.    Allergies  Allergen Reactions  . Penicillins     Unknown childhood reaction    Review of Systemspatient is in a wheelchair today She failed to tolerate room air for the past week with good saturation She bumped  her right tibia against a piece of furniture and has a dressing in place  BP 117/69 mmHg  Pulse 76  Resp 20  Ht 5\' 5"  (1.651 m)  Wt 128 lb (58.06 kg)  BMI 21.30 kg/m2  SpO2 97% Physical Exam Alert and comfortable Lungs clear Pleurx exit site sutures are removed-both are well-healed  Diagnostic Tests: Chest x-rays clear  Impression: Successful treatment of malignant pleural effusion with Pleurx catheters, now removed  Plan:return as necessary. Hopefully with continued chemotherapy she'll not have recurrent malignant effusions

## 2014-12-14 NOTE — Progress Notes (Signed)
Patient ID: [name], female   DOB: February 04, 1935, 80 y.o.   MRN: [id number]    this is an acute visit.  Level of care skilled.  Facility [facility].  Chief complaint-acute visit follow-up left leg wound.  History of present was.  Patient is a pleasant 79 year old female with a history of metastatic ovarian cancer-she is followed by oncology.  When I saw her a few days ago she had a small open area on her left ege was a small amount surrounding erythema-however there was enough concern for cellulitis to start Levaquin however apparently this area of erythema has increased somewhat approximately 5 cm in diameter-she has been afebrile.  Currently she has no complaints actually appears to be doing a bit better bit stronger eating better constipation has gotten significantly better.  Family medical social history as been reviewed most recently progress note 12/07/2013  Medications have been reviewed per [facility].  Review of systems.  Does not complain of fever or chills.  Resp- does not complain of shortness of breath or cough.  Cardiac no chest pain.  GI-does not complain of abdominal discomfort constipation appears to be significantly improved again history of carcinoma.  Skin-as noted in history of present illness.  Physical exam.  She is afebrile.--Pulse of 80.  In general this is a pleasant elderly female in no distress resting comfortably.  Her skin is warm and dry-lower left leg there is a small open area with surrounding erythema proximally 5 cm this appears to be about 2 cm greater than when I saw previously there is no active drainage.  Her chest is clear with shallow air entry.  Heart is regular rate and rhythm without murmur gallop or rub.  Assessment and plan.  #1-suspected left leg cellulitis-has been on Levaquin -will switch to doxycycline 100 mg twice a day for 7 days-also culture any drainage-this will have to be monitored.  [id number]    .

## 2014-12-15 ENCOUNTER — Encounter (HOSPITAL_COMMUNITY): Payer: Medicare HMO | Attending: Hematology | Admitting: Hematology & Oncology

## 2014-12-15 ENCOUNTER — Encounter (HOSPITAL_COMMUNITY): Payer: Self-pay | Admitting: Hematology & Oncology

## 2014-12-15 ENCOUNTER — Encounter (HOSPITAL_BASED_OUTPATIENT_CLINIC_OR_DEPARTMENT_OTHER): Payer: Medicare HMO

## 2014-12-15 VITALS — BP 111/58 | HR 75 | Temp 98.0°F | Resp 18 | Wt 127.1 lb

## 2014-12-15 DIAGNOSIS — Z5111 Encounter for antineoplastic chemotherapy: Secondary | ICD-10-CM

## 2014-12-15 DIAGNOSIS — N189 Chronic kidney disease, unspecified: Secondary | ICD-10-CM | POA: Diagnosis present

## 2014-12-15 DIAGNOSIS — D631 Anemia in chronic kidney disease: Secondary | ICD-10-CM | POA: Diagnosis present

## 2014-12-15 DIAGNOSIS — C482 Malignant neoplasm of peritoneum, unspecified: Secondary | ICD-10-CM

## 2014-12-15 DIAGNOSIS — C569 Malignant neoplasm of unspecified ovary: Secondary | ICD-10-CM | POA: Diagnosis present

## 2014-12-15 DIAGNOSIS — J910 Malignant pleural effusion: Secondary | ICD-10-CM

## 2014-12-15 LAB — CBC WITH DIFFERENTIAL/PLATELET
Basophils Absolute: 0 10*3/uL (ref 0.0–0.1)
Basophils Relative: 1 % (ref 0–1)
EOS PCT: 1 % (ref 0–5)
Eosinophils Absolute: 0.1 10*3/uL (ref 0.0–0.7)
HCT: 28.2 % — ABNORMAL LOW (ref 36.0–46.0)
HEMOGLOBIN: 8.9 g/dL — AB (ref 12.0–15.0)
LYMPHS PCT: 17 % (ref 12–46)
Lymphs Abs: 0.9 10*3/uL (ref 0.7–4.0)
MCH: 27.8 pg (ref 26.0–34.0)
MCHC: 31.6 g/dL (ref 30.0–36.0)
MCV: 88.1 fL (ref 78.0–100.0)
MONOS PCT: 7 % (ref 3–12)
Monocytes Absolute: 0.4 10*3/uL (ref 0.1–1.0)
NEUTROS ABS: 3.6 10*3/uL (ref 1.7–7.7)
Neutrophils Relative %: 74 % (ref 43–77)
PLATELETS: 145 10*3/uL — AB (ref 150–400)
RBC: 3.2 MIL/uL — ABNORMAL LOW (ref 3.87–5.11)
RDW: 21.3 % — AB (ref 11.5–15.5)
WBC: 4.9 10*3/uL (ref 4.0–10.5)

## 2014-12-15 LAB — COMPREHENSIVE METABOLIC PANEL
ALBUMIN: 3.4 g/dL — AB (ref 3.5–5.2)
ALK PHOS: 57 U/L (ref 39–117)
ALT: 10 U/L (ref 0–35)
ANION GAP: 5 (ref 5–15)
AST: 15 U/L (ref 0–37)
BUN: 38 mg/dL — AB (ref 6–23)
CALCIUM: 8.8 mg/dL (ref 8.4–10.5)
CHLORIDE: 104 meq/L (ref 96–112)
CO2: 31 mmol/L (ref 19–32)
CREATININE: 0.9 mg/dL (ref 0.50–1.10)
GFR calc non Af Amer: 60 mL/min — ABNORMAL LOW (ref >90)
GFR, EST AFRICAN AMERICAN: 70 mL/min — AB (ref >90)
Glucose, Bld: 132 mg/dL — ABNORMAL HIGH (ref 70–99)
POTASSIUM: 3.9 mmol/L (ref 3.5–5.1)
SODIUM: 140 mmol/L (ref 135–145)
TOTAL PROTEIN: 6.3 g/dL (ref 6.0–8.3)
Total Bilirubin: 0.3 mg/dL (ref 0.3–1.2)

## 2014-12-15 MED ORDER — SODIUM CHLORIDE 0.9 % IJ SOLN
10.0000 mL | INTRAMUSCULAR | Status: DC | PRN
  Administered 2014-12-15: 10 mL
  Filled 2014-12-15: qty 10

## 2014-12-15 MED ORDER — FAMOTIDINE IN NACL 20-0.9 MG/50ML-% IV SOLN
INTRAVENOUS | Status: AC
  Filled 2014-12-15: qty 50

## 2014-12-15 MED ORDER — SODIUM CHLORIDE 0.9 % IV SOLN
Freq: Once | INTRAVENOUS | Status: AC
  Administered 2014-12-15: 14:00:00 via INTRAVENOUS
  Filled 2014-12-15: qty 5

## 2014-12-15 MED ORDER — DIPHENHYDRAMINE HCL 50 MG/ML IJ SOLN
INTRAMUSCULAR | Status: AC
  Filled 2014-12-15: qty 1

## 2014-12-15 MED ORDER — PACLITAXEL PROTEIN-BOUND CHEMO INJECTION 100 MG
80.0000 mg/m2 | Freq: Once | INTRAVENOUS | Status: DC
  Filled 2014-12-15: qty 30

## 2014-12-15 MED ORDER — PALONOSETRON HCL INJECTION 0.25 MG/5ML
INTRAVENOUS | Status: AC
  Filled 2014-12-15: qty 5

## 2014-12-15 MED ORDER — CARBOPLATIN CHEMO INJECTION 450 MG/45ML
145.0000 mg | Freq: Once | INTRAVENOUS | Status: AC
  Administered 2014-12-15: 150 mg via INTRAVENOUS
  Filled 2014-12-15: qty 15

## 2014-12-15 MED ORDER — DIPHENHYDRAMINE HCL 50 MG/ML IJ SOLN
50.0000 mg | Freq: Once | INTRAMUSCULAR | Status: AC
  Administered 2014-12-15: 50 mg via INTRAVENOUS

## 2014-12-15 MED ORDER — FAMOTIDINE IN NACL 20-0.9 MG/50ML-% IV SOLN
20.0000 mg | Freq: Once | INTRAVENOUS | Status: AC
  Administered 2014-12-15: 20 mg via INTRAVENOUS

## 2014-12-15 MED ORDER — SODIUM CHLORIDE 0.9 % IV SOLN
Freq: Once | INTRAVENOUS | Status: AC
  Administered 2014-12-15: 14:00:00 via INTRAVENOUS

## 2014-12-15 MED ORDER — PALONOSETRON HCL INJECTION 0.25 MG/5ML
0.2500 mg | Freq: Once | INTRAVENOUS | Status: AC
  Administered 2014-12-15: 0.25 mg via INTRAVENOUS

## 2014-12-15 MED ORDER — PACLITAXEL PROTEIN-BOUND CHEMO INJECTION 100 MG
80.0000 mg/m2 | Freq: Once | INTRAVENOUS | Status: AC
  Administered 2014-12-15: 125 mg via INTRAVENOUS
  Filled 2014-12-15: qty 25

## 2014-12-15 MED ORDER — HEPARIN SOD (PORK) LOCK FLUSH 100 UNIT/ML IV SOLN
500.0000 [IU] | Freq: Once | INTRAVENOUS | Status: AC | PRN
  Administered 2014-12-15: 500 [IU]
  Filled 2014-12-15: qty 5

## 2014-12-15 NOTE — Progress Notes (Signed)
[name] Tolerated chemotherapy well today

## 2014-12-15 NOTE — Patient Instructions (Addendum)
[facility] Discharge Instructions for Patients Receiving Chemotherapy  Today you received the following chemotherapy agents abraxene, Norma Fredrickson  To help prevent nausea and vomiting after your treatment, we encourage you to take your nausea medication.   If you develop nausea and vomiting that is not controlled by your nausea medication, call the clinic. If it is after clinic hours your family physician or the after hours number for the clinic or go to the Emergency Department.   BELOW ARE SYMPTOMS THAT SHOULD BE REPORTED IMMEDIATELY:  *FEVER GREATER THAN 101.0 F  *CHILLS WITH OR WITHOUT FEVER  NAUSEA AND VOMITING THAT IS NOT CONTROLLED WITH YOUR NAUSEA MEDICATION  *UNUSUAL SHORTNESS OF BREATH  *UNUSUAL BRUISING OR BLEEDING  TENDERNESS IN MOUTH AND THROAT WITH OR WITHOUT PRESENCE OF ULCERS  *URINARY PROBLEMS  *BOWEL PROBLEMS  UNUSUAL RASH Items with * indicate a potential emergency and should be followed up as soon as possible.  One of the nurses will contact you 24 hours after your treatment. Please let the nurse know about any problems that you may have experienced. Feel free to call the clinic you have any questions or concerns. The clinic phone number is [phone].   I have been informed and understand all the instructions given to me. I know to contact the clinic, my physician, or go to the Emergency Department if any problems should occur. I do not have any questions at this time, but understand that I may call the clinic during office hours or the Patient Navigator at [phone] should I have any questions or need assistance in obtaining follow up care.

## 2014-12-15 NOTE — Assessment & Plan Note (Signed)
Pleasant 80 year old with primary peritoneal carcinoma, stage IV disease secondary to the presence of bilateral malignant pleural effusions.  She is doing remarkably well. CA-125 continues to decline. Her catheters have been removed, she is off O2. She is making significant clinical improvements as well.  We will proceed with therapy today.  I will see her back again next week for d8.

## 2014-12-15 NOTE — Progress Notes (Signed)
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 BRCA2 is positive on tumor cells present in the pleural fluid making the patient a candidate for Olaparib as second line treatment should current regimen become ineffective or too toxic. Began Carboplatin/Paclitaxel on 09/22/2014. On day 8 cycle 3 Abraxane will be substituted for Taxol.  INTERVAL HISTORY: [name] 80 y.o. female returns for follow-up of her peritoneal carcinomatosis. She is doing well. She is off oxygen. Her pleurx catheters have been removed. She really has few complaints today.  She has occasional abdominal tenderness. Constipation is improved. She is here for cycle #4 of Carboplatin/Abraxane.    ca MEDICAL HISTORY: Past Medical History  Diagnosis Date  . GERD (gastroesophageal reflux disease)   . Osteoarthritis   . Peripheral edema   . CHF (congestive heart failure), NYHA class IV     diastolic  . Cancer     has GERD; CONSTIPATION, CHRONIC; HEMATOCHEZIA; OSTEOARTHRITIS, LOWER LEG; KNEE PAIN; BURSITIS, KNEE; Dysphagia; Malignant pleural effusion; Edema of both legs; Heart failure, diastolic, with acute decompensation; Hyperkalemia; Anemia, unspecified; Peritoneal carcinoma; Rectal bleed; Chest pain; Protein-calorie malnutrition, severe; Dysphagia, pharyngoesophageal phase; Constipation; Acute on chronic congestive heart failure; Acute on chronic respiratory failure with hypoxia; Ovarian cancer; Anemia due to chemotherapy; and Cellulitis on her problem list.      Ovarian cancer   07/25/2014 Pathology Results Left pleural effusion positive for malignant cell consistent with adenocarcinoma.     07/27/2014 - 08/05/2014 Hospital Admission Heart failure with malignant pleural effusion   07/28/2014 Pathology Results Left pleural effusion positive for malignant cells consistent with adenocarcinoma (CK7 pos, CK20 neg, WT-1 pos, ER weakly pos, PR neg, TTF-1 neg, GCDFP-15 neg).  Findings favor gyn/ovarian primary.   08/01/2014 Pathology Results [facility] genomic findings: BRCA2 R231f*31, STK11 loss, MYC amplification-equivocal, TP53 R273C, MLL2 QV3748.  Targeted treatments include Olaparib (BRCA2) and Everolimus/Temsirolimus (STK11).   08/03/2014 Tumor Marker CA 125 4954   08/04/2014 Procedure Left pleurX catheter by Dr. VLucianne LeiTright   09/08/2014 - 09/14/2014 Hospital Admission Hospitalized with the following main issues: acute on chronic CHF, acute on chronic respiratory failure with hypoxia, and malignant plueral effusion on right    09/13/2014 Procedure Right PleurX catheter by Dr. BCyndia Bent  09/13/2014 Pathology Results Right pleural effusion positive for malignant adenocarcinoma   09/21/2014 Tumor Marker CA 125 [id number]   09/22/2014 -  Chemotherapy Carboplatin/Paclitaxel days 1, 8, 15 every 28 days.   10/20/2014 Tumor Marker CA 125 335     is allergic to penicillins.  [name] not currently have medications on file.  SURGICAL HISTORY: Past Surgical History  Procedure Laterality Date  . Cholecystectomy    . Cardiac catheterization    . Total abdominal hysterectomy  40 years ago    patient does not know if ovaries were removed  . Appendectomy    . Total knee arthroplasty Left   . Chest tube insertion Left 08/04/2014    Procedure: INSERTION PLEURAL DRAINAGE CATHETER;  Surgeon: PIvin Poot MD;  Location: [place]  Service: Thoracic;  Laterality: Left;  . Chest tube insertion Right 09/13/2014    Procedure: INSERTION PLEURAL DRAINAGE CATHETER;  Surgeon: BGaye Pollack MD;  Location: [place]  Service:  Thoracic;  Laterality: Right;  . Removal of pleural drainage catheter Bilateral  12/06/2014    Procedure: REMOVAL OF PLEURAL DRAINAGE CATHETER;  Surgeon: Ivin Poot, MD;  Location: [place];  Service: Thoracic;  Laterality: Bilateral;    SOCIAL HISTORY: History   Social History  . Marital Status: Widowed    Spouse Name: N/A    Number of Children: N/A  . Years of Education: N/A   Occupational History  . nurses aid    Social History Main Topics  . Smoking status: Never Smoker   . Smokeless tobacco: Not on file  . Alcohol Use: No  . Drug Use: No  . Sexual Activity: Not on file   Other Topics Concern  . Not on file   Social History Narrative    FAMILY HISTORY: Noncontributory   Review of Systems  HENT: Negative.   Eyes: Negative.   Respiratory: Negative.   Cardiovascular: Negative.   Gastrointestinal: Negative.        Constipation improved. Stool soft  Genitourinary: Negative.   Musculoskeletal: Positive for joint pain. Negative for falls.  Skin: Negative.   Neurological: Positive for weakness.  Endo/Heme/Allergies: Negative.   Psychiatric/Behavioral: Negative.     PHYSICAL EXAMINATION  ECOG PERFORMANCE STATUS: 2 - Symptomatic, <50% confined to bed  Filed Vitals:   12/15/14 1100  BP: 111/58  Pulse: 75  Temp: 98 F (36.7 C)  Resp: 18    Physical Exam  Constitutional: She is oriented to person, place, and time and well-developed, well-nourished, and in no distress.  Lying in bed, gets up easily during exam  HENT:  Head: Normocephalic and atraumatic.  Mouth/Throat: No oropharyngeal exudate.  Eyes: Conjunctivae and EOM are normal. Pupils are equal, round, and reactive to light. No scleral icterus.  Neck: Normal range of motion. Neck supple. No JVD present. No thyromegaly present.  Cardiovascular: Normal rate and regular rhythm.  Exam reveals no friction rub.   No murmur heard. Pulmonary/Chest: Effort normal and breath sounds normal. No respiratory distress. She has no wheezes. She has no rales.  pleurx incision sites well healing     Abdominal: Soft. Bowel sounds are normal. She exhibits no distension. There is tenderness. There is no rebound and no guarding.  Tenderness mild  Musculoskeletal: Normal range of motion.  Lymphadenopathy:    She has no cervical adenopathy.  Neurological: She is alert and oriented to person, place, and time. No cranial nerve deficit. Coordination normal.  Skin: Skin is warm and dry.  Psychiatric: Mood, memory, affect and judgment normal.    LABORATORY DATA:  CBC    Component Value Date/Time   WBC 5.4 11/30/2014 1002   RBC 3.53* 11/30/2014 1002   RBC 4.29 07/27/2014 2257   HGB 9.5* 11/30/2014 1002   HCT 31.0* 11/30/2014 1002   PLT 226 11/30/2014 1002   MCV 87.8 11/30/2014 1002   MCH 26.9 11/30/2014 1002   MCHC 30.6 11/30/2014 1002   RDW 19.9* 11/30/2014 1002   LYMPHSABS 0.8 11/30/2014 1002   MONOABS 0.2 11/30/2014 1002   EOSABS 0.1 11/30/2014 1002   BASOSABS 0.0 11/30/2014 1002   CMP     Component Value Date/Time   NA 139 11/30/2014 1002   K 3.8 11/30/2014 1002   CL 103 11/30/2014 1002   CO2 29 11/30/2014 1002   GLUCOSE 117* 11/30/2014 1002   BUN 23 11/30/2014 1002   CREATININE 0.73 11/30/2014 1002   CALCIUM 8.8 11/30/2014 1002   PROT 6.5 11/30/2014 1002   ALBUMIN  3.4* 11/30/2014 1002   AST 17 11/30/2014 1002   ALT 13 11/30/2014 1002   ALKPHOS 62 11/30/2014 1002   BILITOT 0.3 11/30/2014 1002   GFRNONAA 80* 11/30/2014 1002   GFRAA >90 11/30/2014 1002    ASSESSMENT and THERAPY PLAN:    Peritoneal carcinoma Pleasant 80 year old with primary peritoneal carcinoma, stage IV disease secondary to the presence of bilateral malignant pleural effusions.  She is doing remarkably well. CA-125 continues to decline. Her catheters have been removed, she is off O2. She is making significant clinical improvements as well.  We will proceed with therapy today.  I will see her back again next week for d8.      All questions were answered. The patient knows to call the clinic  with any problems, questions or concerns. We can certainly see the patient much sooner if necessary.   Molli Hazard 12/15/2014

## 2014-12-15 NOTE — Patient Instructions (Signed)
[name] at [facility]  Discharge Instructions:  Please call with any problems or concerns. We will see you back next week for your next treatment. I am going to try to get physical therapy to work with you again. _______________________________________________________________  Thank you for choosing [place] at [facility] to provide your oncology and hematology care.  To afford each patient quality time with our providers, please arrive at least 15 minutes before your scheduled appointment.  You need to re-schedule your appointment if you arrive 10 or more minutes late.  We strive to give you quality time with our providers, and arriving late affects you and other patients whose appointments are after yours.  Also, if you no show three or more times for appointments you may be dismissed from the clinic.  Again, thank you for choosing [place] [facility] [place] hope is that these requests will allow you access to exceptional care and in a timely manner. _______________________________________________________________  If you have questions after your visit, please contact our office at [phone] between the hours of 8:30 a.m. and 5:00 p.m. Voicemails left after 4:30 p.m. will not be returned until the following business day. _______________________________________________________________  For prescription refill requests, have your pharmacy contact our office. _______________________________________________________________  Recommendations made by the consultant and any test results will be sent to your referring physician. _______________________________________________________________

## 2014-12-15 NOTE — Progress Notes (Signed)
Tolerated chemo well. D/C via wheelchair with friend to nursing home.

## 2014-12-16 LAB — CA 125: CA 125: 36 U/mL — AB (ref <35)

## 2014-12-18 NOTE — Progress Notes (Signed)
Sarah Bogus, MD [place] [name] [id number]  Peritoneal carcinoma  CURRENT THERAPY: Carboplatin/Abraxane  INTERVAL HISTORY: [name] 80 y.o. 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    Ovarian cancer   07/25/2014 Pathology Results Left pleural effusion positive for malignant cell consistent with adenocarcinoma.   07/27/2014 - 08/05/2014 Hospital Admission Heart failure with malignant pleural effusion   07/28/2014 Pathology Results Left pleural effusion positive for malignant cells consistent with adenocarcinoma (CK7 pos, CK20 neg, WT-1 pos, ER weakly pos, PR neg, TTF-1 neg, GCDFP-15 neg).  Findings favor gyn/ovarian primary.   08/01/2014 Pathology Results [facility]- Postive genomic findings: BRCA2 R234f*31, STK11 loss, MYC amplification-equivocal, TP53 R273C, MLL2 QV4259.  Targeted treatments include Olaparib (BRCA2) and Everolimus/Temsirolimus (STK11).   08/03/2014 Tumor Marker CA 125 4954   08/04/2014 Procedure Left pleurX catheter by Dr. VLucianne LeiTright   09/08/2014 - 09/14/2014 Hospital Admission Hospitalized with the following main issues: acute on chronic CHF, acute on chronic respiratory failure with hypoxia, and malignant plueral effusion on right    09/13/2014 Procedure Right PleurX catheter by Dr. BCyndia Bent  09/13/2014 Pathology Results Right pleural effusion positive for malignant adenocarcinoma   09/21/2014 Tumor Marker CA 125 3869   09/22/2014 - 11/17/2014 Chemotherapy Carboplatin/Paclitaxel days 1, 8, 15 every 28 days.   10/20/2014 Tumor Marker CA 125 335   11/23/2014 -  Chemotherapy Carboplatin/Abraxane day 1, 8, 15 every 28 days.   I personally reviewed and went over laboratory results with the patient.  The results are noted within this dictation.  She seems to be  tolerating therapy well.  She does not voice any complaints today with regards to chemotherapy tolerability.    She notes that her performance status is improving.  [facility]  She notes that her activity is improved.  Her rash is better.    Biochemically and clinically she is responding nicely.  Past Medical History  Diagnosis Date  . GERD (gastroesophageal reflux disease)   . Osteoarthritis   . Peripheral edema   . CHF (congestive heart failure), NYHA class IV     diastolic  . Cancer     has GERD; CONSTIPATION, CHRONIC; HEMATOCHEZIA; OSTEOARTHRITIS, LOWER LEG; KNEE PAIN; BURSITIS, KNEE; Dysphagia; Malignant pleural effusion; Edema of both legs; Heart failure, diastolic, with acute decompensation; Hyperkalemia; Anemia, unspecified; Peritoneal carcinoma; Rectal bleed; Chest pain; Protein-calorie malnutrition, severe; Dysphagia, pharyngoesophageal phase; Constipation; Acute on chronic congestive heart failure; Acute on chronic respiratory failure with hypoxia; Ovarian cancer; Anemia due to chemotherapy; and Cellulitis on her problem list.     is allergic to penicillins.  Ms. BArenasdoes not currently have medications on file.  Past Surgical History  Procedure Laterality Date  . Cholecystectomy    . Cardiac catheterization    . Total abdominal hysterectomy  40 years ago    patient does not know if ovaries were removed  . Appendectomy    . Total knee arthroplasty Left   . Chest tube insertion Left 08/04/2014    Procedure: INSERTION PLEURAL DRAINAGE CATHETER;  Surgeon: PIvin Poot MD;  [place]  Service: Thoracic;  Laterality: Left;  . Chest tube insertion Right 09/13/2014    Procedure: INSERTION PLEURAL DRAINAGE CATHETER;  Surgeon: BGaye Pollack MD;  Location: [facility] OR;  Service: Thoracic;  Laterality: Right;  . Removal of pleural drainage catheter Bilateral 12/06/2014    Procedure: REMOVAL OF PLEURAL DRAINAGE  CATHETER;  Surgeon: Ivin Poot, MD;  Location: [facility] OR;  Service: Thoracic;  Laterality: Bilateral;    Denies any headaches, dizziness, double vision, fevers, chills, night sweats, nausea, vomiting, diarrhea, constipation, chest pain, heart palpitations, shortness of breath, blood in stool, black tarry stool, urinary pain, urinary burning, urinary frequency, hematuria.   PHYSICAL EXAMINATION  ECOG PERFORMANCE STATUS: 2 - Symptomatic, <50% confined to bed  There were no vitals filed for this visit.  GENERAL:alert, no distress, well nourished, well developed, comfortable, cooperative and smiling SKIN: skin color, texture, turgor are normal HEAD: Normocephalic, No masses, lesions, tenderness or abnormalities EYES: normal, PERRLA, EOMI, Conjunctiva are pink and non-injected EARS: External ears normal OROPHARYNX:lips, buccal mucosa, and tongue normal and mucous membranes are moist  NECK: supple, trachea midline LYMPH:  not examined BREAST:not examined LUNGS: clear to auscultation  HEART: regular rate & rhythm, no murmurs and no gallops ABDOMEN:abdomen soft and normal bowel sounds BACK: Back symmetric, no curvature. EXTREMITIES:less then 2 second capillary refill, no joint deformities, effusion, or inflammation, no skin discoloration, no cyanosis  NEURO: alert & oriented x 3 with fluent speech, no focal motor/sensory deficits   LABORATORY DATA: CBC    Component Value Date/Time   WBC 5.0 12/22/2014 0926   RBC 3.42* 12/22/2014 0926   RBC 4.29 07/27/2014 2257   HGB 9.6* 12/22/2014 0926   HCT 30.1* 12/22/2014 0926   PLT 139* 12/22/2014 0926   MCV 88.0 12/22/2014 0926   MCH 28.1 12/22/2014 0926   MCHC 31.9 12/22/2014 0926   RDW 20.7* 12/22/2014 0926   LYMPHSABS 1.1 12/22/2014 0926   MONOABS 0.2 12/22/2014 0926   EOSABS 0.2 12/22/2014 0926   BASOSABS 0.0 12/22/2014 0926      Chemistry      Component Value Date/Time   NA 138 12/22/2014 0926   K 3.4* 12/22/2014 0926   CL 103  12/22/2014 0926   CO2 29 12/22/2014 0926   BUN 36* 12/22/2014 0926   CREATININE 0.93 12/22/2014 0926      Component Value Date/Time   CALCIUM 8.9 12/22/2014 0926   ALKPHOS 57 12/22/2014 0926   AST 17 12/22/2014 0926   ALT 10 12/22/2014 0926   BILITOT 0.4 12/22/2014 0926      Lab Results  Component Value Date   CA125 36* 12/15/2014     RADIOGRAPHIC STUDIES:  Dg Chest 2 View  12/14/2014   CLINICAL DATA:  Malignant pleural effusions.  EXAM: CHEST  2 VIEW  COMPARISON:  11/16/2014.  FINDINGS: The power port is stable. The cardiac silhouette, mediastinal and hilar contours are within normal limits and unchanged. There are small bilateral pleural effusions. The PleurX drainage catheters have been removed. Streaky areas scarring changes and minimal basilar atelectasis. The bony thorax is intact.  IMPRESSION: The PleurX drainage catheters have been removed since the prior chest film. There are small residual pleural effusions.   Electronically Signed   By: Kalman Jewels M.D.   On: 12/14/2014 14:02     ASSESSMENT AND PLAN:  Peritoneal carcinoma Stage IV primary peritoneal carcinomatosis (BRAC2 + on [facility] testing) with bilateral malignant pleural effusions, status post PleurX catheter in each hemithorax, now removed; evidence of clinic response to therapy.  Today is day 8 of cycle 4 of Carboplatin/Abraxane.  Nice biochemical response to therapy thus far with most recent CA 125  being 36 (normal range is 0-35)  Thus far with a nice clinical and biochemical response to therapy and therefore no need for restaging scans at this time.  Future treatment dates built.  Pre-chemo labs as planned. Return in 2 weeks for follow-up.    THERAPY PLAN:  We will continue with therapy as planned.  No role for restaging scans at this time given biochemical and clinical response to therapy.  All questions were answered. The patient knows to call the clinic with any problems, questions or concerns. We  can certainly see the patient much sooner if necessary.  Patient and plan discussed with Dr. Ancil Linsey and she is in agreement with the aforementioned.   KEFALAS,THOMAS 12/22/2014

## 2014-12-18 NOTE — Assessment & Plan Note (Addendum)
Stage IV primary peritoneal carcinomatosis (BRAC2 + on [facility] testing) with bilateral malignant pleural effusions, status post PleurX catheter in each hemithorax, now removed; evidence of clinic response to therapy.  Today is day 8 of cycle 4 of Carboplatin/Abraxane.  Nice biochemical response to therapy thus far with most recent CA 125 being 36 (normal range is 0-35)  Thus far with a nice clinical and biochemical response to therapy and therefore no need for restaging scans at this time.  Future treatment dates built.  Pre-chemo labs as planned. Return in 2 weeks for follow-up.

## 2014-12-22 ENCOUNTER — Encounter (HOSPITAL_COMMUNITY): Payer: Self-pay

## 2014-12-22 ENCOUNTER — Inpatient Hospital Stay (HOSPITAL_COMMUNITY): Payer: Medicare HMO

## 2014-12-22 ENCOUNTER — Encounter (HOSPITAL_BASED_OUTPATIENT_CLINIC_OR_DEPARTMENT_OTHER): Payer: Medicare HMO | Admitting: Oncology

## 2014-12-22 ENCOUNTER — Encounter (HOSPITAL_BASED_OUTPATIENT_CLINIC_OR_DEPARTMENT_OTHER): Payer: Medicare HMO

## 2014-12-22 DIAGNOSIS — J910 Malignant pleural effusion: Secondary | ICD-10-CM

## 2014-12-22 DIAGNOSIS — C482 Malignant neoplasm of peritoneum, unspecified: Secondary | ICD-10-CM | POA: Diagnosis not present

## 2014-12-22 DIAGNOSIS — C569 Malignant neoplasm of unspecified ovary: Secondary | ICD-10-CM

## 2014-12-22 DIAGNOSIS — D6481 Anemia due to antineoplastic chemotherapy: Secondary | ICD-10-CM

## 2014-12-22 DIAGNOSIS — T451X5A Adverse effect of antineoplastic and immunosuppressive drugs, initial encounter: Secondary | ICD-10-CM

## 2014-12-22 DIAGNOSIS — Z5111 Encounter for antineoplastic chemotherapy: Secondary | ICD-10-CM

## 2014-12-22 LAB — COMPREHENSIVE METABOLIC PANEL
ALBUMIN: 3.4 g/dL — AB (ref 3.5–5.2)
ALK PHOS: 57 U/L (ref 39–117)
ALT: 10 U/L (ref 0–35)
ANION GAP: 6 (ref 5–15)
AST: 17 U/L (ref 0–37)
BUN: 36 mg/dL — ABNORMAL HIGH (ref 6–23)
CALCIUM: 8.9 mg/dL (ref 8.4–10.5)
CHLORIDE: 103 meq/L (ref 96–112)
CO2: 29 mmol/L (ref 19–32)
Creatinine, Ser: 0.93 mg/dL (ref 0.50–1.10)
GFR calc Af Amer: 67 mL/min — ABNORMAL LOW (ref >90)
GFR, EST NON AFRICAN AMERICAN: 58 mL/min — AB (ref >90)
Glucose, Bld: 153 mg/dL — ABNORMAL HIGH (ref 70–99)
Potassium: 3.4 mmol/L — ABNORMAL LOW (ref 3.5–5.1)
Sodium: 138 mmol/L (ref 135–145)
TOTAL PROTEIN: 6.5 g/dL (ref 6.0–8.3)
Total Bilirubin: 0.4 mg/dL (ref 0.3–1.2)

## 2014-12-22 LAB — CBC WITH DIFFERENTIAL/PLATELET
BASOS ABS: 0 10*3/uL (ref 0.0–0.1)
Basophils Relative: 1 % (ref 0–1)
EOS ABS: 0.2 10*3/uL (ref 0.0–0.7)
EOS PCT: 4 % (ref 0–5)
HCT: 30.1 % — ABNORMAL LOW (ref 36.0–46.0)
Hemoglobin: 9.6 g/dL — ABNORMAL LOW (ref 12.0–15.0)
Lymphocytes Relative: 22 % (ref 12–46)
Lymphs Abs: 1.1 10*3/uL (ref 0.7–4.0)
MCH: 28.1 pg (ref 26.0–34.0)
MCHC: 31.9 g/dL (ref 30.0–36.0)
MCV: 88 fL (ref 78.0–100.0)
MONOS PCT: 3 % (ref 3–12)
Monocytes Absolute: 0.2 10*3/uL (ref 0.1–1.0)
NEUTROS ABS: 3.6 10*3/uL (ref 1.7–7.7)
Neutrophils Relative %: 70 % (ref 43–77)
PLATELETS: 139 10*3/uL — AB (ref 150–400)
RBC: 3.42 MIL/uL — ABNORMAL LOW (ref 3.87–5.11)
RDW: 20.7 % — AB (ref 11.5–15.5)
WBC: 5 10*3/uL (ref 4.0–10.5)

## 2014-12-22 MED ORDER — SODIUM CHLORIDE 0.9 % IV SOLN
Freq: Once | INTRAVENOUS | Status: AC
  Administered 2014-12-22: 10:00:00 via INTRAVENOUS

## 2014-12-22 MED ORDER — FAMOTIDINE IN NACL 20-0.9 MG/50ML-% IV SOLN
INTRAVENOUS | Status: AC
  Filled 2014-12-22: qty 50

## 2014-12-22 MED ORDER — DIPHENHYDRAMINE HCL 50 MG/ML IJ SOLN
INTRAMUSCULAR | Status: AC
  Filled 2014-12-22: qty 1

## 2014-12-22 MED ORDER — SODIUM CHLORIDE 0.9 % IV SOLN
145.0000 mg | Freq: Once | INTRAVENOUS | Status: AC
  Administered 2014-12-22: 150 mg via INTRAVENOUS
  Filled 2014-12-22: qty 15

## 2014-12-22 MED ORDER — PALONOSETRON HCL INJECTION 0.25 MG/5ML
INTRAVENOUS | Status: AC
  Filled 2014-12-22: qty 5

## 2014-12-22 MED ORDER — DARBEPOETIN ALFA 300 MCG/0.6ML IJ SOSY
PREFILLED_SYRINGE | INTRAMUSCULAR | Status: AC
  Filled 2014-12-22: qty 0.6

## 2014-12-22 MED ORDER — HEPARIN SOD (PORK) LOCK FLUSH 100 UNIT/ML IV SOLN
500.0000 [IU] | Freq: Once | INTRAVENOUS | Status: AC | PRN
  Administered 2014-12-22: 500 [IU]
  Filled 2014-12-22: qty 5

## 2014-12-22 MED ORDER — DARBEPOETIN ALFA 200 MCG/0.4ML IJ SOSY
150.0000 ug | PREFILLED_SYRINGE | Freq: Once | INTRAMUSCULAR | Status: AC
  Administered 2014-12-22: 150 ug via SUBCUTANEOUS

## 2014-12-22 MED ORDER — PALONOSETRON HCL INJECTION 0.25 MG/5ML
0.2500 mg | Freq: Once | INTRAVENOUS | Status: AC
  Administered 2014-12-22: 0.25 mg via INTRAVENOUS

## 2014-12-22 MED ORDER — DIPHENHYDRAMINE HCL 50 MG/ML IJ SOLN
50.0000 mg | Freq: Once | INTRAMUSCULAR | Status: AC
  Administered 2014-12-22: 50 mg via INTRAVENOUS

## 2014-12-22 MED ORDER — FAMOTIDINE IN NACL 20-0.9 MG/50ML-% IV SOLN
20.0000 mg | Freq: Once | INTRAVENOUS | Status: AC
  Administered 2014-12-22: 20 mg via INTRAVENOUS

## 2014-12-22 MED ORDER — SODIUM CHLORIDE 0.9 % IV SOLN
Freq: Once | INTRAVENOUS | Status: AC
  Administered 2014-12-22: 11:00:00 via INTRAVENOUS
  Filled 2014-12-22: qty 5

## 2014-12-22 MED ORDER — PACLITAXEL PROTEIN-BOUND CHEMO INJECTION 100 MG
80.0000 mg/m2 | Freq: Once | INTRAVENOUS | Status: AC
  Administered 2014-12-22: 125 mg via INTRAVENOUS
  Filled 2014-12-22: qty 25

## 2014-12-22 NOTE — Patient Instructions (Signed)
[facility] Discharge Instructions for Patients Receiving Chemotherapy  Today you received the following chemotherapy agents abraxene, carbo Please call the clinic if you have any questions or concerns  To help prevent nausea and vomiting after your treatment, we encourage you to take your nausea medication    If you develop nausea and vomiting that is not controlled by your nausea medication, call the clinic. If it is after clinic hours your family physician or the after hours number for the clinic or go to the Emergency Department.   BELOW ARE SYMPTOMS THAT SHOULD BE REPORTED IMMEDIATELY:  *FEVER GREATER THAN 101.0 F  *CHILLS WITH OR WITHOUT FEVER  NAUSEA AND VOMITING THAT IS NOT CONTROLLED WITH YOUR NAUSEA MEDICATION  *UNUSUAL SHORTNESS OF BREATH  *UNUSUAL BRUISING OR BLEEDING  TENDERNESS IN MOUTH AND THROAT WITH OR WITHOUT PRESENCE OF ULCERS  *URINARY PROBLEMS  *BOWEL PROBLEMS  UNUSUAL RASH Items with * indicate a potential emergency and should be followed up as soon as possible.  One of the nurses will contact you 24 hours after your treatment. Please let the nurse know about any problems that you may have experienced. Feel free to call the clinic you have any questions or concerns. The clinic phone number is [phone].   I have been informed and understand all the instructions given to me. I know to contact the clinic, my physician, or go to the Emergency Department if any problems should occur. I do not have any questions at this time, but understand that I may call the clinic during office hours or the Patient Navigator at [phone] should I have any questions or need assistance in obtaining follow up care.  Nanoparticle Albumin-Bound Paclitaxel injection What is this medicine? NANOPARTICLE ALBUMIN-BOUND PACLITAXEL (Na no PAHR ti kuhl al BYOO muhn-bound PAK li TAX el) is a chemotherapy drug. It targets fast dividing cells, like cancer cells, and  causes these cells to die. This medicine is used to treat advanced breast cancer and advanced lung cancer. This medicine may be used for other purposes; ask your health care provider or pharmacist if you have questions. COMMON BRAND NAME(S): Abraxane What should I tell my health care provider before I take this medicine? They need to know if you have any of these conditions: -kidney disease -liver disease -low blood counts, like low platelets, red blood cells, or white blood cells -recent or ongoing radiation therapy -an unusual or allergic reaction to paclitaxel, albumin, other chemotherapy, other medicines, foods, dyes, or preservatives -pregnant or trying to get pregnant -breast-feeding How should I use this medicine? This drug is given as an infusion into a vein. It is administered in a hospital or clinic by a specially trained health care professional. Talk to your pediatrician regarding the use of this medicine in children. Special care may be needed. Overdosage: If you think you have taken too much of this medicine contact a poison control center or emergency room at once. NOTE: This medicine is only for you. Do not share this medicine with others. What if I miss a dose? It is important not to miss your dose. Call your doctor or health care professional if you are unable to keep an appointment. What may interact with this medicine? -cyclosporine -diazepam -ketoconazole -medicines to increase blood counts like filgrastim, pegfilgrastim, sargramostim -other chemotherapy drugs like cisplatin, doxorubicin, epirubicin, etoposide, teniposide, vincristine -quinidine -testosterone -vaccines -verapamil Talk to your doctor or health care professional before taking any of these medicines: -acetaminophen -aspirin -  ibuprofen -ketoprofen -naproxen This list may not describe all possible interactions. Give your health care provider a list of all the medicines, herbs, non-prescription  drugs, or dietary supplements you use. Also tell them if you smoke, drink alcohol, or use illegal drugs. Some items may interact with your medicine. What should I watch for while using this medicine? Your condition will be monitored carefully while you are receiving this medicine. You will need important blood work done while you are taking this medicine. This drug may make you feel generally unwell. This is not uncommon, as chemotherapy can affect healthy cells as well as cancer cells. Report any side effects. Continue your course of treatment even though you feel ill unless your doctor tells you to stop. In some cases, you may be given additional medicines to help with side effects. Follow all directions for their use. Call your doctor or health care professional for advice if you get a fever, chills or sore throat, or other symptoms of a cold or flu. Do not treat yourself. This drug decreases your body's ability to fight infections. Try to avoid being around people who are sick. This medicine may increase your risk to bruise or bleed. Call your doctor or health care professional if you notice any unusual bleeding. Be careful brushing and flossing your teeth or using a toothpick because you may get an infection or bleed more easily. If you have any dental work done, tell your dentist you are receiving this medicine. Avoid taking products that contain aspirin, acetaminophen, ibuprofen, naproxen, or ketoprofen unless instructed by your doctor. These medicines may hide a fever. Do not become pregnant while taking this medicine. Women should inform their doctor if they wish to become pregnant or think they might be pregnant. There is a potential for serious side effects to an unborn child. Talk to your health care professional or pharmacist for more information. Do not breast-feed an infant while taking this medicine. Men are advised not to father a child while receiving this medicine. What side effects may  I notice from receiving this medicine? Side effects that you should report to your doctor or health care professional as soon as possible: -allergic reactions like skin rash, itching or hives, swelling of the face, lips, or tongue -low blood counts - This drug may decrease the number of white blood cells, red blood cells and platelets. You may be at increased risk for infections and bleeding. -signs of infection - fever or chills, cough, sore throat, pain or difficulty passing urine -signs of decreased platelets or bleeding - bruising, pinpoint red spots on the skin, black, tarry stools, nosebleeds -signs of decreased red blood cells - unusually weak or tired, fainting spells, lightheadedness -breathing problems -changes in vision -chest pain -high or low blood pressure -mouth sores -nausea and vomiting -pain, swelling, redness or irritation at the injection site -pain, tingling, numbness in the hands or feet -slow or irregular heartbeat -swelling of the ankle, feet, hands Side effects that usually do not require medical attention (report to your doctor or health care professional if they continue or are bothersome): -aches, pains -changes in the color of fingernails -diarrhea -hair loss -loss of appetite This list may not describe all possible side effects. Call your doctor for medical advice about side effects. You may report side effects to FDA at 1-800-FDA-1088. Where should I keep my medicine? This drug is given in a hospital or clinic and will not be stored at home. NOTE: This sheet is a  summary. It may not cover all possible information. If you have questions about this medicine, talk to your doctor, pharmacist, or health care provider.  2015, Elsevier/Gold Standard. (2013-01-11 16:48:50)   Carboplatin injection What is this medicine? CARBOPLATIN (KAR boe pla tin) is a chemotherapy drug. It targets fast dividing cells, like cancer cells, and causes these cells to die. This  medicine is used to treat ovarian cancer and many other cancers. This medicine may be used for other purposes; ask your health care provider or pharmacist if you have questions. COMMON BRAND NAME(S): Paraplatin What should I tell my health care provider before I take this medicine? They need to know if you have any of these conditions: -blood disorders -hearing problems -kidney disease -recent or ongoing radiation therapy -an unusual or allergic reaction to carboplatin, cisplatin, other chemotherapy, other medicines, foods, dyes, or preservatives -pregnant or trying to get pregnant -breast-feeding How should I use this medicine? This drug is usually given as an infusion into a vein. It is administered in a hospital or clinic by a specially trained health care professional. Talk to your pediatrician regarding the use of this medicine in children. Special care may be needed. Overdosage: If you think you have taken too much of this medicine contact a poison control center or emergency room at once. NOTE: This medicine is only for you. Do not share this medicine with others. What if I miss a dose? It is important not to miss a dose. Call your doctor or health care professional if you are unable to keep an appointment. What may interact with this medicine? -medicines for seizures -medicines to increase blood counts like filgrastim, pegfilgrastim, sargramostim -some antibiotics like amikacin, gentamicin, neomycin, streptomycin, tobramycin -vaccines Talk to your doctor or health care professional before taking any of these medicines: -acetaminophen -aspirin -ibuprofen -ketoprofen -naproxen This list may not describe all possible interactions. Give your health care provider a list of all the medicines, herbs, non-prescription drugs, or dietary supplements you use. Also tell them if you smoke, drink alcohol, or use illegal drugs. Some items may interact with your medicine. What should I watch  for while using this medicine? Your condition will be monitored carefully while you are receiving this medicine. You will need important blood work done while you are taking this medicine. This drug may make you feel generally unwell. This is not uncommon, as chemotherapy can affect healthy cells as well as cancer cells. Report any side effects. Continue your course of treatment even though you feel ill unless your doctor tells you to stop. In some cases, you may be given additional medicines to help with side effects. Follow all directions for their use. Call your doctor or health care professional for advice if you get a fever, chills or sore throat, or other symptoms of a cold or flu. Do not treat yourself. This drug decreases your body's ability to fight infections. Try to avoid being around people who are sick. This medicine may increase your risk to bruise or bleed. Call your doctor or health care professional if you notice any unusual bleeding. Be careful brushing and flossing your teeth or using a toothpick because you may get an infection or bleed more easily. If you have any dental work done, tell your dentist you are receiving this medicine. Avoid taking products that contain aspirin, acetaminophen, ibuprofen, naproxen, or ketoprofen unless instructed by your doctor. These medicines may hide a fever. Do not become pregnant while taking this medicine. Women should inform  their doctor if they wish to become pregnant or think they might be pregnant. There is a potential for serious side effects to an unborn child. Talk to your health care professional or pharmacist for more information. Do not breast-feed an infant while taking this medicine. What side effects may I notice from receiving this medicine? Side effects that you should report to your doctor or health care professional as soon as possible: -allergic reactions like skin rash, itching or hives, swelling of the face, lips, or  tongue -signs of infection - fever or chills, cough, sore throat, pain or difficulty passing urine -signs of decreased platelets or bleeding - bruising, pinpoint red spots on the skin, black, tarry stools, nosebleeds -signs of decreased red blood cells - unusually weak or tired, fainting spells, lightheadedness -breathing problems -changes in hearing -changes in vision -chest pain -high blood pressure -low blood counts - This drug may decrease the number of white blood cells, red blood cells and platelets. You may be at increased risk for infections and bleeding. -nausea and vomiting -pain, swelling, redness or irritation at the injection site -pain, tingling, numbness in the hands or feet -problems with balance, talking, walking -trouble passing urine or change in the amount of urine Side effects that usually do not require medical attention (report to your doctor or health care professional if they continue or are bothersome): -hair loss -loss of appetite -metallic taste in the mouth or changes in taste This list may not describe all possible side effects. Call your doctor for medical advice about side effects. You may report side effects to FDA at 1-800-FDA-1088. Where should I keep my medicine? This drug is given in a hospital or clinic and will not be stored at home. NOTE: This sheet is a summary. It may not cover all possible information. If you have questions about this medicine, talk to your doctor, pharmacist, or health care provider.  2015, Elsevier/Gold Standard. (2008-02-23 14:38:05)

## 2014-12-22 NOTE — Patient Instructions (Signed)
[place] [facility]  Discharge Instructions:  Continue treatment as planned.  Return next week for treatment. Return in 2 weeks for follow-up appointment and to start cycle 5 of therapy. _______________________________________________________________  Thank you for choosing [place] at [facility] to provide your oncology and hematology care.  To afford each patient quality time with our providers, please arrive at least 15 minutes before your scheduled appointment.  You need to re-schedule your appointment if you arrive 10 or more minutes late.  We strive to give you quality time with our providers, and arriving late affects you and other patients whose appointments are after yours.  Also, if you no show three or more times for appointments you may be dismissed from the clinic.  Again, thank you for choosing [place] [facility] [place] hope is that these requests will allow you access to exceptional care and in a timely manner. _______________________________________________________________  If you have questions after your visit, please contact our office at [phone] between the hours of 8:30 a.m. and 5:00 p.m. Voicemails left after 4:30 p.m. will not be returned until the following business day. _______________________________________________________________  For prescription refill requests, have your pharmacy contact our office. _______________________________________________________________  Recommendations made by the consultant and any test results will be sent to your referring physician. _______________________________________________________________

## 2014-12-22 NOTE — Progress Notes (Signed)
[name] Tolerated chemotherapy infusion well today.  Discharged back to the [facility] today via wheelchair

## 2014-12-29 ENCOUNTER — Encounter (HOSPITAL_BASED_OUTPATIENT_CLINIC_OR_DEPARTMENT_OTHER): Payer: Medicare HMO

## 2014-12-29 DIAGNOSIS — Z5111 Encounter for antineoplastic chemotherapy: Secondary | ICD-10-CM

## 2014-12-29 DIAGNOSIS — D6481 Anemia due to antineoplastic chemotherapy: Secondary | ICD-10-CM

## 2014-12-29 DIAGNOSIS — T451X5A Adverse effect of antineoplastic and immunosuppressive drugs, initial encounter: Principal | ICD-10-CM

## 2014-12-29 DIAGNOSIS — C569 Malignant neoplasm of unspecified ovary: Secondary | ICD-10-CM

## 2014-12-29 DIAGNOSIS — C482 Malignant neoplasm of peritoneum, unspecified: Secondary | ICD-10-CM | POA: Diagnosis not present

## 2014-12-29 LAB — COMPREHENSIVE METABOLIC PANEL
ALK PHOS: 51 U/L (ref 39–117)
ALT: 11 U/L (ref 0–35)
ANION GAP: 5 (ref 5–15)
AST: 14 U/L (ref 0–37)
Albumin: 3.3 g/dL — ABNORMAL LOW (ref 3.5–5.2)
BILIRUBIN TOTAL: 0.4 mg/dL (ref 0.3–1.2)
BUN: 32 mg/dL — AB (ref 6–23)
CALCIUM: 8.6 mg/dL (ref 8.4–10.5)
CO2: 31 mmol/L (ref 19–32)
Chloride: 105 mmol/L (ref 96–112)
Creatinine, Ser: 0.88 mg/dL (ref 0.50–1.10)
GFR calc Af Amer: 71 mL/min — ABNORMAL LOW (ref >90)
GFR calc non Af Amer: 61 mL/min — ABNORMAL LOW (ref >90)
Glucose, Bld: 139 mg/dL — ABNORMAL HIGH (ref 70–99)
POTASSIUM: 4.1 mmol/L (ref 3.5–5.1)
SODIUM: 141 mmol/L (ref 135–145)
Total Protein: 6.4 g/dL (ref 6.0–8.3)

## 2014-12-29 LAB — CBC
HEMATOCRIT: 31 % — AB (ref 36.0–46.0)
Hemoglobin: 9.5 g/dL — ABNORMAL LOW (ref 12.0–15.0)
MCH: 27.8 pg (ref 26.0–34.0)
MCHC: 30.6 g/dL (ref 30.0–36.0)
MCV: 90.6 fL (ref 78.0–100.0)
PLATELETS: 177 10*3/uL (ref 150–400)
RBC: 3.42 MIL/uL — AB (ref 3.87–5.11)
RDW: 21.9 % — ABNORMAL HIGH (ref 11.5–15.5)
WBC: 6 10*3/uL (ref 4.0–10.5)

## 2014-12-29 LAB — DIFFERENTIAL
Basophils Absolute: 0 10*3/uL (ref 0.0–0.1)
Basophils Relative: 0 % (ref 0–1)
EOS ABS: 0.1 10*3/uL (ref 0.0–0.7)
Eosinophils Relative: 1 % (ref 0–5)
LYMPHS ABS: 0.8 10*3/uL (ref 0.7–4.0)
Lymphocytes Relative: 13 % (ref 12–46)
Monocytes Absolute: 0.3 10*3/uL (ref 0.1–1.0)
Monocytes Relative: 5 % (ref 3–12)
NEUTROS PCT: 81 % — AB (ref 43–77)
Neutro Abs: 4.8 10*3/uL (ref 1.7–7.7)

## 2014-12-29 MED ORDER — SODIUM CHLORIDE 0.9 % IV SOLN
Freq: Once | INTRAVENOUS | Status: AC
  Administered 2014-12-29: 12:00:00 via INTRAVENOUS

## 2014-12-29 MED ORDER — DIPHENHYDRAMINE HCL 50 MG/ML IJ SOLN
50.0000 mg | Freq: Once | INTRAMUSCULAR | Status: AC
  Administered 2014-12-29: 50 mg via INTRAVENOUS
  Filled 2014-12-29: qty 1

## 2014-12-29 MED ORDER — SODIUM CHLORIDE 0.9 % IV SOLN
Freq: Once | INTRAVENOUS | Status: AC
  Administered 2014-12-29: 13:00:00 via INTRAVENOUS
  Filled 2014-12-29: qty 5

## 2014-12-29 MED ORDER — PACLITAXEL PROTEIN-BOUND CHEMO INJECTION 100 MG
80.0000 mg/m2 | Freq: Once | INTRAVENOUS | Status: AC
  Administered 2014-12-29: 125 mg via INTRAVENOUS
  Filled 2014-12-29: qty 25

## 2014-12-29 MED ORDER — FAMOTIDINE IN NACL 20-0.9 MG/50ML-% IV SOLN
20.0000 mg | Freq: Once | INTRAVENOUS | Status: AC
  Administered 2014-12-29: 20 mg via INTRAVENOUS

## 2014-12-29 MED ORDER — SODIUM CHLORIDE 0.9 % IJ SOLN: 10.0000 mL | INTRAMUSCULAR | Status: DC | PRN

## 2014-12-29 MED ORDER — PALONOSETRON HCL INJECTION 0.25 MG/5ML
0.2500 mg | Freq: Once | INTRAVENOUS | Status: AC
  Administered 2014-12-29: 0.25 mg via INTRAVENOUS
  Filled 2014-12-29: qty 5

## 2014-12-29 MED ORDER — HEPARIN SOD (PORK) LOCK FLUSH 100 UNIT/ML IV SOLN
500.0000 [IU] | Freq: Once | INTRAVENOUS | Status: AC | PRN
  Administered 2014-12-29: 500 [IU]

## 2014-12-29 MED ORDER — SODIUM CHLORIDE 0.9 % IV SOLN
145.0000 mg | Freq: Once | INTRAVENOUS | Status: AC
  Administered 2014-12-29: 150 mg via INTRAVENOUS
  Filled 2014-12-29: qty 15

## 2014-12-29 NOTE — Patient Instructions (Signed)
[facility] Discharge Instructions for Patients Receiving Chemotherapy  Today you received the following chemotherapy agents:  Abraxane, Carboplatin.  Take your medications as prescribed to prevent nausea. Return as scheduled for treatments and office visits.  If you develop nausea and vomiting, or diarrhea that is not controlled by your medication, call the clinic.  The clinic phone number is [phone]. Office hours are Monday-Friday 8:30am-5:00pm.  BELOW ARE SYMPTOMS THAT SHOULD BE REPORTED IMMEDIATELY:  *FEVER GREATER THAN 101.0 F  *CHILLS WITH OR WITHOUT FEVER  NAUSEA AND VOMITING THAT IS NOT CONTROLLED WITH YOUR NAUSEA MEDICATION  *UNUSUAL SHORTNESS OF BREATH  *UNUSUAL BRUISING OR BLEEDING  TENDERNESS IN MOUTH AND THROAT WITH OR WITHOUT PRESENCE OF ULCERS  *URINARY PROBLEMS  *BOWEL PROBLEMS  UNUSUAL RASH Items with * indicate a potential emergency and should be followed up as soon as possible. If you have an emergency after office hours please contact your primary care physician or go to the nearest emergency department.  Please call the clinic during office hours if you have any questions or concerns.   You may also contact the Patient Navigator at [phone] should you have any questions or need assistance in obtaining follow up care. _____________________________________________________________________ Have you asked about our STAR program?    STAR stands for Survivorship Training and Rehabilitation, and this is a nationally recognized cancer care program that focuses on survivorship and rehabilitation.  Cancer and cancer treatments may cause problems, such as, pain, making you feel tired and keeping you from doing the things that you need or want to do. Cancer rehabilitation can help. Our goal is to reduce these troubling effects and help you have the best quality of life possible.  You may receive a survey from a nurse that asks questions about  your current state of health.  Based on the survey results, all eligible patients will be referred to the [facility] program for an evaluation so we can better serve you! A frequently asked questions sheet is available upon request.          Carboplatin injection What is this medicine? CARBOPLATIN (KAR boe pla tin) is a chemotherapy drug. It targets fast dividing cells, like cancer cells, and causes these cells to die. This medicine is used to treat ovarian cancer and many other cancers. This medicine may be used for other purposes; ask your health care provider or pharmacist if you have questions. COMMON BRAND NAME(S): Paraplatin What should I tell my health care provider before I take this medicine? They need to know if you have any of these conditions: -blood disorders -hearing problems -kidney disease -recent or ongoing radiation therapy -an unusual or allergic reaction to carboplatin, cisplatin, other chemotherapy, other medicines, foods, dyes, or preservatives -pregnant or trying to get pregnant -breast-feeding How should I use this medicine? This drug is usually given as an infusion into a vein. It is administered in a hospital or clinic by a specially trained health care professional. Talk to your pediatrician regarding the use of this medicine in children. Special care may be needed. Overdosage: If you think you have taken too much of this medicine contact a poison control center or emergency room at once. NOTE: This medicine is only for you. Do not share this medicine with others. What if I miss a dose? It is important not to miss a dose. Call your doctor or health care professional if you are unable to keep an appointment. What may interact with this medicine? -medicines  for seizures -medicines to increase blood counts like filgrastim, pegfilgrastim, sargramostim -some antibiotics like amikacin, gentamicin, neomycin, streptomycin, tobramycin -vaccines Talk to your doctor or  health care professional before taking any of these medicines: -acetaminophen -aspirin -ibuprofen -ketoprofen -naproxen This list may not describe all possible interactions. Give your health care provider a list of all the medicines, herbs, non-prescription drugs, or dietary supplements you use. Also tell them if you smoke, drink alcohol, or use illegal drugs. Some items may interact with your medicine. What should I watch for while using this medicine? Your condition will be monitored carefully while you are receiving this medicine. You will need important blood work done while you are taking this medicine. This drug may make you feel generally unwell. This is not uncommon, as chemotherapy can affect healthy cells as well as cancer cells. Report any side effects. Continue your course of treatment even though you feel ill unless your doctor tells you to stop. In some cases, you may be given additional medicines to help with side effects. Follow all directions for their use. Call your doctor or health care professional for advice if you get a fever, chills or sore throat, or other symptoms of a cold or flu. Do not treat yourself. This drug decreases your body's ability to fight infections. Try to avoid being around people who are sick. This medicine may increase your risk to bruise or bleed. Call your doctor or health care professional if you notice any unusual bleeding. Be careful brushing and flossing your teeth or using a toothpick because you may get an infection or bleed more easily. If you have any dental work done, tell your dentist you are receiving this medicine. Avoid taking products that contain aspirin, acetaminophen, ibuprofen, naproxen, or ketoprofen unless instructed by your doctor. These medicines may hide a fever. Do not become pregnant while taking this medicine. Women should inform their doctor if they wish to become pregnant or think they might be pregnant. There is a potential for  serious side effects to an unborn child. Talk to your health care professional or pharmacist for more information. Do not breast-feed an infant while taking this medicine. What side effects may I notice from receiving this medicine? Side effects that you should report to your doctor or health care professional as soon as possible: -allergic reactions like skin rash, itching or hives, swelling of the face, lips, or tongue -signs of infection - fever or chills, cough, sore throat, pain or difficulty passing urine -signs of decreased platelets or bleeding - bruising, pinpoint red spots on the skin, black, tarry stools, nosebleeds -signs of decreased red blood cells - unusually weak or tired, fainting spells, lightheadedness -breathing problems -changes in hearing -changes in vision -chest pain -high blood pressure -low blood counts - This drug may decrease the number of white blood cells, red blood cells and platelets. You may be at increased risk for infections and bleeding. -nausea and vomiting -pain, swelling, redness or irritation at the injection site -pain, tingling, numbness in the hands or feet -problems with balance, talking, walking -trouble passing urine or change in the amount of urine Side effects that usually do not require medical attention (report to your doctor or health care professional if they continue or are bothersome): -hair loss -loss of appetite -metallic taste in the mouth or changes in taste This list may not describe all possible side effects. Call your doctor for medical advice about side effects. You may report side effects to FDA at  1-800-FDA-1088. Where should I keep my medicine? This drug is given in a hospital or clinic and will not be stored at home. NOTE: This sheet is a summary. It may not cover all possible information. If you have questions about this medicine, talk to your doctor, pharmacist, or health care provider.  2015, Elsevier/Gold Standard.  (2008-02-23 14:38:05)

## 2015-01-03 NOTE — Progress Notes (Signed)
Patient ID: [name], female   DOB: 05-20-1935, 80 y.o.   MRN: [id number]

## 2015-01-11 ENCOUNTER — Other Ambulatory Visit (HOSPITAL_COMMUNITY): Payer: Self-pay | Admitting: Oncology

## 2015-01-11 ENCOUNTER — Telehealth (HOSPITAL_COMMUNITY): Payer: Self-pay | Admitting: Hematology & Oncology

## 2015-01-11 NOTE — Telephone Encounter (Signed)
PC TO [employer] [phone] TO SEE IF PRECERT IS REQUIRED FOR ALOXI [id number] AND EMEND [id number]. THEY DO REQUIRE AN AUTH. MUST BE SUBMITTED TO [facility] [phone]) FOR APPROVAL

## 2015-01-12 ENCOUNTER — Encounter (HOSPITAL_BASED_OUTPATIENT_CLINIC_OR_DEPARTMENT_OTHER): Payer: Medicare HMO | Admitting: Hematology & Oncology

## 2015-01-12 ENCOUNTER — Encounter (HOSPITAL_BASED_OUTPATIENT_CLINIC_OR_DEPARTMENT_OTHER): Payer: Medicare HMO

## 2015-01-12 ENCOUNTER — Encounter (HOSPITAL_COMMUNITY): Payer: Self-pay

## 2015-01-12 DIAGNOSIS — D6481 Anemia due to antineoplastic chemotherapy: Secondary | ICD-10-CM | POA: Diagnosis not present

## 2015-01-12 DIAGNOSIS — Z5111 Encounter for antineoplastic chemotherapy: Secondary | ICD-10-CM | POA: Diagnosis not present

## 2015-01-12 DIAGNOSIS — C482 Malignant neoplasm of peritoneum, unspecified: Secondary | ICD-10-CM | POA: Diagnosis present

## 2015-01-12 DIAGNOSIS — C569 Malignant neoplasm of unspecified ovary: Secondary | ICD-10-CM

## 2015-01-12 DIAGNOSIS — D631 Anemia in chronic kidney disease: Secondary | ICD-10-CM

## 2015-01-12 DIAGNOSIS — N189 Chronic kidney disease, unspecified: Secondary | ICD-10-CM

## 2015-01-12 DIAGNOSIS — T451X5A Adverse effect of antineoplastic and immunosuppressive drugs, initial encounter: Secondary | ICD-10-CM

## 2015-01-12 LAB — CBC WITH DIFFERENTIAL/PLATELET
Basophils Absolute: 0 K/uL (ref 0.0–0.1)
Basophils Relative: 1 % (ref 0–1)
Eosinophils Absolute: 0.1 K/uL (ref 0.0–0.7)
Eosinophils Relative: 2 % (ref 0–5)
HCT: 30.2 % — ABNORMAL LOW (ref 36.0–46.0)
Hemoglobin: 9.2 g/dL — ABNORMAL LOW (ref 12.0–15.0)
Lymphocytes Relative: 32 % (ref 12–46)
Lymphs Abs: 1.4 K/uL (ref 0.7–4.0)
MCH: 27.6 pg (ref 26.0–34.0)
MCHC: 30.5 g/dL (ref 30.0–36.0)
MCV: 90.7 fL (ref 78.0–100.0)
Monocytes Absolute: 0.7 K/uL (ref 0.1–1.0)
Monocytes Relative: 15 % — ABNORMAL HIGH (ref 3–12)
Neutro Abs: 2.2 K/uL (ref 1.7–7.7)
Neutrophils Relative %: 50 % (ref 43–77)
Platelets: 163 K/uL (ref 150–400)
RBC: 3.33 MIL/uL — ABNORMAL LOW (ref 3.87–5.11)
RDW: 21.3 % — ABNORMAL HIGH (ref 11.5–15.5)
WBC: 4.4 K/uL (ref 4.0–10.5)

## 2015-01-12 LAB — COMPREHENSIVE METABOLIC PANEL WITH GFR
ALT: 10 U/L (ref 0–35)
AST: 14 U/L (ref 0–37)
Albumin: 3.4 g/dL — ABNORMAL LOW (ref 3.5–5.2)
Alkaline Phosphatase: 54 U/L (ref 39–117)
Anion gap: 4 — ABNORMAL LOW (ref 5–15)
BUN: 38 mg/dL — ABNORMAL HIGH (ref 6–23)
CO2: 28 mmol/L (ref 19–32)
Calcium: 8.5 mg/dL (ref 8.4–10.5)
Chloride: 109 mmol/L (ref 96–112)
Creatinine, Ser: 0.91 mg/dL (ref 0.50–1.10)
GFR calc Af Amer: 68 mL/min — ABNORMAL LOW (ref >90)
GFR calc non Af Amer: 59 mL/min — ABNORMAL LOW (ref >90)
Glucose, Bld: 88 mg/dL (ref 70–99)
Potassium: 3.3 mmol/L — ABNORMAL LOW (ref 3.5–5.1)
Sodium: 141 mmol/L (ref 135–145)
Total Bilirubin: 0.4 mg/dL (ref 0.3–1.2)
Total Protein: 6.1 g/dL (ref 6.0–8.3)

## 2015-01-12 MED ORDER — HEPARIN SOD (PORK) LOCK FLUSH 100 UNIT/ML IV SOLN
INTRAVENOUS | Status: AC
  Filled 2015-01-12: qty 5

## 2015-01-12 MED ORDER — OXYCODONE HCL 5 MG PO TABS
5.0000 mg | ORAL_TABLET | Freq: Once | ORAL | Status: AC
  Administered 2015-01-12: 5 mg via ORAL
  Filled 2015-01-12: qty 1

## 2015-01-12 MED ORDER — PACLITAXEL PROTEIN-BOUND CHEMO INJECTION 100 MG
80.0000 mg/m2 | Freq: Once | INTRAVENOUS | Status: AC
  Administered 2015-01-12: 125 mg via INTRAVENOUS
  Filled 2015-01-12: qty 25

## 2015-01-12 MED ORDER — SODIUM CHLORIDE 0.9 % IV SOLN
Freq: Once | INTRAVENOUS | Status: AC
  Administered 2015-01-12: 13:00:00 via INTRAVENOUS

## 2015-01-12 MED ORDER — SODIUM CHLORIDE 0.9 % IJ SOLN
10.0000 mL | INTRAMUSCULAR | Status: DC | PRN
  Administered 2015-01-12: 10 mL
  Filled 2015-01-12: qty 10

## 2015-01-12 MED ORDER — DARBEPOETIN ALFA 200 MCG/0.4ML IJ SOSY
150.0000 ug | PREFILLED_SYRINGE | Freq: Once | INTRAMUSCULAR | Status: AC
  Administered 2015-01-12: 150 ug via SUBCUTANEOUS

## 2015-01-12 MED ORDER — FOSAPREPITANT DIMEGLUMINE INJECTION 150 MG
Freq: Once | INTRAVENOUS | Status: AC
  Administered 2015-01-12: 13:00:00 via INTRAVENOUS
  Filled 2015-01-12: qty 5

## 2015-01-12 MED ORDER — PALONOSETRON HCL INJECTION 0.25 MG/5ML
0.2500 mg | Freq: Once | INTRAVENOUS | Status: AC
  Administered 2015-01-12: 0.25 mg via INTRAVENOUS
  Filled 2015-01-12: qty 5

## 2015-01-12 MED ORDER — DIPHENHYDRAMINE HCL 50 MG/ML IJ SOLN
50.0000 mg | Freq: Once | INTRAMUSCULAR | Status: AC
  Administered 2015-01-12: 50 mg via INTRAVENOUS
  Filled 2015-01-12: qty 1

## 2015-01-12 MED ORDER — DARBEPOETIN ALFA 200 MCG/0.4ML IJ SOSY
PREFILLED_SYRINGE | INTRAMUSCULAR | Status: AC
  Filled 2015-01-12: qty 0.4

## 2015-01-12 MED ORDER — FAMOTIDINE IN NACL 20-0.9 MG/50ML-% IV SOLN
20.0000 mg | Freq: Once | INTRAVENOUS | Status: AC
  Administered 2015-01-12: 20 mg via INTRAVENOUS
  Filled 2015-01-12: qty 50

## 2015-01-12 MED ORDER — HEPARIN SOD (PORK) LOCK FLUSH 100 UNIT/ML IV SOLN
500.0000 [IU] | Freq: Once | INTRAVENOUS | Status: AC | PRN
  Administered 2015-01-12: 500 [IU]

## 2015-01-12 MED ORDER — SODIUM CHLORIDE 0.9 % IV SOLN
145.0000 mg | Freq: Once | INTRAVENOUS | Status: AC
  Administered 2015-01-12: 150 mg via INTRAVENOUS
  Filled 2015-01-12: qty 15

## 2015-01-12 NOTE — Patient Instructions (Signed)
[facility] Discharge Instructions for Patients Receiving Chemotherapy  Today you received the following chemotherapy agents abraxene, carbo Follow up as scheduled.  Call the clinic if you have any questions or concerns  To help prevent nausea and vomiting after your treatment, we encourage you to take your nausea medication    If you develop nausea and vomiting that is not controlled by your nausea medication, call the clinic. If it is after clinic hours your family physician or the after hours number for the clinic or go to the Emergency Department.   BELOW ARE SYMPTOMS THAT SHOULD BE REPORTED IMMEDIATELY:  *FEVER GREATER THAN 101.0 F  *CHILLS WITH OR WITHOUT FEVER  NAUSEA AND VOMITING THAT IS NOT CONTROLLED WITH YOUR NAUSEA MEDICATION  *UNUSUAL SHORTNESS OF BREATH  *UNUSUAL BRUISING OR BLEEDING  TENDERNESS IN MOUTH AND THROAT WITH OR WITHOUT PRESENCE OF ULCERS  *URINARY PROBLEMS  *BOWEL PROBLEMS  UNUSUAL RASH Items with * indicate a potential emergency and should be followed up as soon as possible.  One of the nurses will contact you 24 hours after your treatment. Please let the nurse know about any problems that you may have experienced. Feel free to call the clinic you have any questions or concerns. The clinic phone number is [phone].   I have been informed and understand all the instructions given to me. I know to contact the clinic, my physician, or go to the Emergency Department if any problems should occur. I do not have any questions at this time, but understand that I may call the clinic during office hours or the Patient Navigator at [phone] should I have any questions or need assistance in obtaining follow up care.    __________________________________________  _____________  __________ Signature of Patient or Authorized Representative            Date                   Time    __________________________________________ Nurse's  Signature

## 2015-01-12 NOTE — Progress Notes (Signed)
[place] chemotherapy today.  Discharged via [facility] back to nursing home.  [name]'s reason for visit today is for an injection.  [name] also received aranesp 150 mcq in left abdomen sq per MD orders; see [facility] for administration details.  [name] tolerated all procedures well and without incident; questions were answered and patient was discharged.

## 2015-01-13 LAB — CA 125: CA 125: 16.1 U/mL (ref 0.0–34.0)

## 2015-01-15 ENCOUNTER — Encounter (HOSPITAL_COMMUNITY): Payer: Self-pay | Admitting: Hematology & Oncology

## 2015-01-15 NOTE — Progress Notes (Signed)
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 BRCA2 is positive on tumor cells present in the pleural fluid making the patient a candidate for Olaparib as second line treatment should current regimen become ineffective or too toxic. Began Carboplatin/Paclitaxel on 09/22/2014. On day 8 cycle 3 Abraxane will be substituted for Taxol.  INTERVAL HISTORY: [name] 80 y.o. female returns for follow-up of her peritoneal carcinomatosis. She is doing well. She is off oxygen. Her pleurx catheters have been removed. She really has few complaints today.  She has occasional abdominal tenderness. Constipation is improved. She is here for cycle #4 of Carboplatin/Abraxane.    ca MEDICAL HISTORY: Past Medical History  Diagnosis Date  . GERD (gastroesophageal reflux disease)   . Osteoarthritis   . Peripheral edema   . CHF (congestive heart failure), NYHA class IV     diastolic  . Cancer     has GERD; CONSTIPATION, CHRONIC; HEMATOCHEZIA; OSTEOARTHRITIS, LOWER LEG; KNEE PAIN; BURSITIS, KNEE; Dysphagia; Malignant pleural effusion; Edema of both legs; Heart failure, diastolic, with acute decompensation; Hyperkalemia; Anemia, unspecified; Peritoneal carcinoma; Rectal bleed; Chest pain; Protein-calorie malnutrition, severe; Dysphagia, pharyngoesophageal phase; Constipation; Acute on chronic congestive heart failure; Acute on chronic respiratory failure with hypoxia; Ovarian cancer; Anemia due to chemotherapy; and Cellulitis on her problem list.      Ovarian cancer   07/25/2014 Pathology Results Left pleural effusion positive for malignant cell consistent with adenocarcinoma.     07/27/2014 - 08/05/2014 Hospital Admission Heart failure with malignant pleural effusion   07/28/2014 Pathology Results Left pleural effusion positive for malignant cells consistent with adenocarcinoma (CK7 pos, CK20 neg, WT-1 pos, ER weakly pos, PR neg, TTF-1 neg, GCDFP-15 neg).  Findings favor gyn/ovarian primary.   08/01/2014 Pathology Results [facility] genomic findings: BRCA2 R2339f*31, STK11 loss, MYC amplification-equivocal, TP53 R273C, MLL2 QU0370.  Targeted treatments include Olaparib (BRCA2) and Everolimus/Temsirolimus (STK11).   08/03/2014 Tumor Marker CA 125 4954   08/04/2014 Procedure Left pleurX catheter by Dr. VLucianne LeiTright   09/08/2014 - 09/14/2014 Hospital Admission Hospitalized with the following main issues: acute on chronic CHF, acute on chronic respiratory failure with hypoxia, and malignant plueral effusion on right    09/13/2014 Procedure Right PleurX catheter by Dr. BCyndia Bent  09/13/2014 Pathology Results Right pleural effusion positive for malignant adenocarcinoma   09/21/2014 Tumor Marker CA 125 3869   09/22/2014 - 11/17/2014 Chemotherapy Carboplatin/Paclitaxel days 1, 8, 15 every 28 days.   10/20/2014 Tumor Marker CA 125 335   11/23/2014 -  Chemotherapy Carboplatin/Abraxane day 1, 8, 15 every 28 days.     is allergic to penicillins.  Ms. BDobranskydoes not currently have medications on file.  SURGICAL HISTORY: Past Surgical History  Procedure Laterality Date  . Cholecystectomy    . Cardiac catheterization    . Total abdominal hysterectomy  40 years ago    patient does not know if ovaries were removed  . Appendectomy    . Total knee arthroplasty Left   . Chest tube insertion Left 08/04/2014    Procedure: INSERTION PLEURAL DRAINAGE CATHETER;  Surgeon: PIvin Poot MD;  Location: [facility]  Service: Thoracic;  Laterality: Left;  . Chest tube insertion Right 09/13/2014    Procedure: INSERTION PLEURAL  DRAINAGE CATHETER;  Surgeon: Gaye Pollack, MD;  Location: [facility] OR;   Service: Thoracic;  Laterality: Right;  . Removal of pleural drainage catheter Bilateral 12/06/2014    Procedure: REMOVAL OF PLEURAL DRAINAGE CATHETER;  Surgeon: Ivin Poot, MD;  Location: [facility];  Service: Thoracic;  Laterality: Bilateral;    SOCIAL HISTORY: History   Social History  . Marital Status: Widowed    Spouse Name: N/A  . Number of Children: N/A  . Years of Education: N/A   Occupational History  . nurses aid    Social History Main Topics  . Smoking status: Never Smoker   . Smokeless tobacco: Not on file  . Alcohol Use: No  . Drug Use: No  . Sexual Activity: Not on file   Other Topics Concern  . Not on file   Social History Narrative    FAMILY HISTORY: Noncontributory   Review of Systems  Constitutional: Positive for malaise/fatigue.  HENT: Negative.   Eyes: Negative.   Respiratory: Negative.   Cardiovascular: Negative.   Gastrointestinal: Negative.        Chronic mild abdominal discomfort. No further constipation  Genitourinary: Negative.   Musculoskeletal: Negative.        Uses a walker  Skin: Negative.   Neurological: Positive for tremors and weakness. Negative for dizziness, tingling, seizures and loss of consciousness.  Endo/Heme/Allergies: Negative.   Psychiatric/Behavioral: Negative.     PHYSICAL EXAMINATION  ECOG PERFORMANCE STATUS: 2 - Symptomatic, <50% confined to bed  There were no vitals filed for this visit.  Physical Exam  Constitutional: She is oriented to person, place, and time and well-developed, well-nourished, and in no distress.  Lying in bed, gets up easily during exam  HENT:  Head: Normocephalic and atraumatic.  Mouth/Throat: No oropharyngeal exudate.  Eyes: Conjunctivae and EOM are normal. Pupils are equal, round, and reactive to light. No scleral icterus.  Neck: Normal range of motion. Neck supple. No JVD present. No thyromegaly present.  Cardiovascular: Normal rate and regular rhythm.  Exam reveals no friction rub.    No murmur heard. Pulmonary/Chest: Effort normal and breath sounds normal. No respiratory distress. She has no wheezes. She has no rales.  Abdominal: Soft. Bowel sounds are normal. She exhibits no distension. There is tenderness. There is no rebound and no guarding.  Tenderness mild  Musculoskeletal: Normal range of motion.  Lymphadenopathy:    She has no cervical adenopathy.  Neurological: She is alert and oriented to person, place, and time. No cranial nerve deficit. Coordination normal.  Skin: Skin is warm and dry.  Psychiatric: Mood, memory, affect and judgment normal.    LABORATORY DATA:  CBC    Component Value Date/Time   WBC 4.4 01/12/2015 1012   RBC 3.33* 01/12/2015 1012   RBC 4.29 07/27/2014 2257   HGB 9.2* 01/12/2015 1012   HCT 30.2* 01/12/2015 1012   PLT 163 01/12/2015 1012   MCV 90.7 01/12/2015 1012   MCH 27.6 01/12/2015 1012   MCHC 30.5 01/12/2015 1012   RDW 21.3* 01/12/2015 1012   LYMPHSABS 1.4 01/12/2015 1012   MONOABS 0.7 01/12/2015 1012   EOSABS 0.1 01/12/2015 1012   BASOSABS 0.0 01/12/2015 1012   CMP     Component Value Date/Time   NA 141 01/12/2015 1034   K 3.3* 01/12/2015 1034   CL 109 01/12/2015 1034   CO2 28 01/12/2015 1034   GLUCOSE 88 01/12/2015 1034   BUN 38* 01/12/2015 1034   CREATININE 0.91 01/12/2015 1034  CALCIUM 8.5 01/12/2015 1034   PROT 6.1 01/12/2015 1034   ALBUMIN 3.4* 01/12/2015 1034   AST 14 01/12/2015 1034   ALT 10 01/12/2015 1034   ALKPHOS 54 01/12/2015 1034   BILITOT 0.4 01/12/2015 1034   GFRNONAA 59* 01/12/2015 1034   GFRAA 68* 01/12/2015 1034    ASSESSMENT and THERAPY PLAN:    Peritoneal carcinoma Present 80 year old female with stage IV primary peritoneal carcinoma on carboplatin and Taxol. She is here today for cycle 5 of therapy. She has overall done surprisingly well. She had bilateral Pleuryx catheters in place; these are now removed. Her breathing is good and she rarely uses oxygen. Her ambulation is improving  and she still uses a walker although now gets up with a cane as well. She is hoping to be able to go home. Her CA 125 continues to decline.  Plan will be for 6 cycles of therapy. I have again addressed with she and her husband that surgery will not play a role given her extent of disease. But if they continue to have questions about offered to refer them back to [facility] to talk to their gynecologic oncologist. We will see her back again prior to her next treatment, sooner if needed.   All questions were answered. The patient knows to call the clinic with any problems, questions or concerns. We can certainly see the patient much sooner if necessary.   Molli Hazard 01/15/2015

## 2015-01-15 NOTE — Assessment & Plan Note (Signed)
Present 80 year old female with stage IV primary peritoneal carcinoma on carboplatin and Taxol. She is here today for cycle 5 of therapy. She has overall done surprisingly well. She had bilateral Pleuryx catheters in place; these are now removed. Her breathing is good and she rarely uses oxygen. Her ambulation is improving and she still uses a walker although now gets up with a cane as well. She is hoping to be able to go home. Her CA 125 continues to decline.  Plan will be for 6 cycles of therapy. I have again addressed with she and her husband that surgery will not play a role given her extent of disease. But if they continue to have questions about offered to refer them back to [facility] to talk to their gynecologic oncologist. We will see her back again prior to her next treatment, sooner if needed.

## 2015-01-19 ENCOUNTER — Encounter (HOSPITAL_BASED_OUTPATIENT_CLINIC_OR_DEPARTMENT_OTHER): Payer: Medicare HMO

## 2015-01-19 ENCOUNTER — Encounter (HOSPITAL_COMMUNITY): Payer: Self-pay

## 2015-01-19 VITALS — BP 113/66 | HR 76 | Temp 97.4°F | Resp 18 | Wt 135.4 lb

## 2015-01-19 DIAGNOSIS — C569 Malignant neoplasm of unspecified ovary: Secondary | ICD-10-CM

## 2015-01-19 DIAGNOSIS — Z5111 Encounter for antineoplastic chemotherapy: Secondary | ICD-10-CM

## 2015-01-19 DIAGNOSIS — D6481 Anemia due to antineoplastic chemotherapy: Secondary | ICD-10-CM

## 2015-01-19 DIAGNOSIS — T451X5A Adverse effect of antineoplastic and immunosuppressive drugs, initial encounter: Secondary | ICD-10-CM

## 2015-01-19 DIAGNOSIS — C482 Malignant neoplasm of peritoneum, unspecified: Secondary | ICD-10-CM | POA: Diagnosis not present

## 2015-01-19 LAB — CBC WITH DIFFERENTIAL/PLATELET
BASOS PCT: 1 % (ref 0–1)
Basophils Absolute: 0 10*3/uL (ref 0.0–0.1)
Eosinophils Absolute: 0 10*3/uL (ref 0.0–0.7)
Eosinophils Relative: 1 % (ref 0–5)
HCT: 32.1 % — ABNORMAL LOW (ref 36.0–46.0)
HEMOGLOBIN: 9.9 g/dL — AB (ref 12.0–15.0)
Lymphocytes Relative: 17 % (ref 12–46)
Lymphs Abs: 1 10*3/uL (ref 0.7–4.0)
MCH: 28 pg (ref 26.0–34.0)
MCHC: 30.8 g/dL (ref 30.0–36.0)
MCV: 90.7 fL (ref 78.0–100.0)
MONOS PCT: 7 % (ref 3–12)
Monocytes Absolute: 0.4 10*3/uL (ref 0.1–1.0)
NEUTROS ABS: 4.2 10*3/uL (ref 1.7–7.7)
NEUTROS PCT: 74 % (ref 43–77)
Platelets: 177 10*3/uL (ref 150–400)
RBC: 3.54 MIL/uL — AB (ref 3.87–5.11)
RDW: 20.9 % — ABNORMAL HIGH (ref 11.5–15.5)
WBC: 5.6 10*3/uL (ref 4.0–10.5)

## 2015-01-19 LAB — COMPREHENSIVE METABOLIC PANEL
ALK PHOS: 63 U/L (ref 39–117)
ALT: 15 U/L (ref 0–35)
ANION GAP: 6 (ref 5–15)
AST: 16 U/L (ref 0–37)
Albumin: 3.6 g/dL (ref 3.5–5.2)
BUN: 36 mg/dL — ABNORMAL HIGH (ref 6–23)
CALCIUM: 8.6 mg/dL (ref 8.4–10.5)
CO2: 30 mmol/L (ref 19–32)
CREATININE: 0.97 mg/dL (ref 0.50–1.10)
Chloride: 103 mmol/L (ref 96–112)
GFR calc Af Amer: 63 mL/min — ABNORMAL LOW (ref >90)
GFR calc non Af Amer: 55 mL/min — ABNORMAL LOW (ref >90)
GLUCOSE: 110 mg/dL — AB (ref 70–99)
Potassium: 3.9 mmol/L (ref 3.5–5.1)
Sodium: 139 mmol/L (ref 135–145)
Total Bilirubin: 0.4 mg/dL (ref 0.3–1.2)
Total Protein: 6.6 g/dL (ref 6.0–8.3)

## 2015-01-19 MED ORDER — PACLITAXEL PROTEIN-BOUND CHEMO INJECTION 100 MG
80.0000 mg/m2 | Freq: Once | INTRAVENOUS | Status: AC
  Administered 2015-01-19: 125 mg via INTRAVENOUS
  Filled 2015-01-19: qty 25

## 2015-01-19 MED ORDER — SODIUM CHLORIDE 0.9 % IV SOLN
145.0000 mg | Freq: Once | INTRAVENOUS | Status: AC
  Administered 2015-01-19: 150 mg via INTRAVENOUS
  Filled 2015-01-19: qty 15

## 2015-01-19 MED ORDER — PACLITAXEL PROTEIN-BOUND CHEMO INJECTION 100 MG: 80.0000 mg/m2 | Freq: Once | INTRAVENOUS | Status: DC

## 2015-01-19 MED ORDER — SODIUM CHLORIDE 0.9 % IV SOLN
Freq: Once | INTRAVENOUS | Status: AC
  Administered 2015-01-19: 13:00:00 via INTRAVENOUS
  Filled 2015-01-19: qty 5

## 2015-01-19 MED ORDER — SODIUM CHLORIDE 0.9 % IV SOLN
Freq: Once | INTRAVENOUS | Status: AC
  Administered 2015-01-19: 11:00:00 via INTRAVENOUS

## 2015-01-19 MED ORDER — DIPHENHYDRAMINE HCL 50 MG/ML IJ SOLN
50.0000 mg | Freq: Once | INTRAMUSCULAR | Status: AC
  Administered 2015-01-19: 50 mg via INTRAVENOUS
  Filled 2015-01-19: qty 1

## 2015-01-19 MED ORDER — DARBEPOETIN ALFA 200 MCG/0.4ML IJ SOSY
150.0000 ug | PREFILLED_SYRINGE | Freq: Once | INTRAMUSCULAR | Status: AC
  Administered 2015-01-19: 150 ug via SUBCUTANEOUS
  Filled 2015-01-19: qty 0.4

## 2015-01-19 MED ORDER — SODIUM CHLORIDE 0.9 % IJ SOLN
10.0000 mL | INTRAMUSCULAR | Status: DC | PRN
  Administered 2015-01-19: 10 mL
  Filled 2015-01-19: qty 10

## 2015-01-19 MED ORDER — PALONOSETRON HCL INJECTION 0.25 MG/5ML
0.2500 mg | Freq: Once | INTRAVENOUS | Status: AC
  Administered 2015-01-19: 0.25 mg via INTRAVENOUS
  Filled 2015-01-19: qty 5

## 2015-01-19 MED ORDER — HEPARIN SOD (PORK) LOCK FLUSH 100 UNIT/ML IV SOLN
500.0000 [IU] | Freq: Once | INTRAVENOUS | Status: AC | PRN
  Administered 2015-01-19: 500 [IU]
  Filled 2015-01-19: qty 5

## 2015-01-19 MED ORDER — FAMOTIDINE IN NACL 20-0.9 MG/50ML-% IV SOLN
20.0000 mg | Freq: Once | INTRAVENOUS | Status: AC
  Administered 2015-01-19: 20 mg via INTRAVENOUS
  Filled 2015-01-19: qty 50

## 2015-01-19 NOTE — Progress Notes (Signed)
Tolerated well

## 2015-01-19 NOTE — Patient Instructions (Signed)
..  [facility] Discharge Instructions for Patients Receiving Chemotherapy  Today you received the following chemotherapy agents abraxane and carboplatin We gave you aranesp for your red blood cells Call with any problems prior to next months visit  To help prevent nausea and vomiting after your treatment, we encourage you to take your nausea medication as directed If you develop nausea and vomiting, or diarrhea that is not controlled by your medication, call the clinic.  The clinic phone number is [phone]. Office hours are Monday-Friday 8:30am-5:00pm.  BELOW ARE SYMPTOMS THAT SHOULD BE REPORTED IMMEDIATELY:  *FEVER GREATER THAN 101.0 F  *CHILLS WITH OR WITHOUT FEVER  NAUSEA AND VOMITING THAT IS NOT CONTROLLED WITH YOUR NAUSEA MEDICATION  *UNUSUAL SHORTNESS OF BREATH  *UNUSUAL BRUISING OR BLEEDING  TENDERNESS IN MOUTH AND THROAT WITH OR WITHOUT PRESENCE OF ULCERS  *URINARY PROBLEMS  *BOWEL PROBLEMS  UNUSUAL RASH Items with * indicate a potential emergency and should be followed up as soon as possible. If you have an emergency after office hours please contact your primary care physician or go to the nearest emergency department.  Please call the clinic during office hours if you have any questions or concerns.   You may also contact the Patient Navigator at [phone] should you have any questions or need assistance in obtaining follow up care. _____________________________________________________________________ Have you asked about our STAR program?    STAR stands for Survivorship Training and Rehabilitation, and this is a nationally recognized cancer care program that focuses on survivorship and rehabilitation.  Cancer and cancer treatments may cause problems, such as, pain, making you feel tired and keeping you from doing the things that you need or want to do. Cancer rehabilitation can help. Our goal is to reduce these troubling effects and help you  have the best quality of life possible.  You may receive a survey from a nurse that asks questions about your current state of health.  Based on the survey results, all eligible patients will be referred to the [facility] program for an evaluation so we can better serve you! A frequently asked questions sheet is available upon request.

## 2015-01-20 ENCOUNTER — Telehealth (HOSPITAL_COMMUNITY): Payer: Self-pay | Admitting: Hematology & Oncology

## 2015-01-20 ENCOUNTER — Other Ambulatory Visit (HOSPITAL_COMMUNITY)
Admission: AD | Admit: 2015-01-20 | Discharge: 2015-01-20 | Disposition: A | Discharge status: Home / Self Care | Payer: Medicare HMO | Source: Skilled Nursing Facility | Attending: Internal Medicine | Admitting: Internal Medicine

## 2015-01-20 DIAGNOSIS — R69 Illness, unspecified: Secondary | ICD-10-CM | POA: Diagnosis present

## 2015-01-20 LAB — BASIC METABOLIC PANEL
ANION GAP: 7 (ref 5–15)
BUN: 36 mg/dL — ABNORMAL HIGH (ref 6–23)
CALCIUM: 9.1 mg/dL (ref 8.4–10.5)
CHLORIDE: 105 mmol/L (ref 96–112)
CO2: 31 mmol/L (ref 19–32)
Creatinine, Ser: 0.87 mg/dL (ref 0.50–1.10)
GFR calc Af Amer: 72 mL/min — ABNORMAL LOW (ref >90)
GFR calc non Af Amer: 62 mL/min — ABNORMAL LOW (ref >90)
Glucose, Bld: 97 mg/dL (ref 70–99)
Potassium: 3.8 mmol/L (ref 3.5–5.1)
SODIUM: 143 mmol/L (ref 135–145)

## 2015-01-20 LAB — CA 125: CA 125: 17.1 U/mL (ref 0.0–34.0)

## 2015-01-20 NOTE — Telephone Encounter (Signed)
[place] ON [phone].

## 2015-01-26 ENCOUNTER — Encounter (HOSPITAL_BASED_OUTPATIENT_CLINIC_OR_DEPARTMENT_OTHER): Payer: Medicare HMO | Admitting: Hematology & Oncology

## 2015-01-26 ENCOUNTER — Encounter (HOSPITAL_COMMUNITY): Payer: Medicare HMO | Attending: Hematology & Oncology

## 2015-01-26 ENCOUNTER — Encounter (HOSPITAL_COMMUNITY): Payer: Self-pay | Admitting: Hematology & Oncology

## 2015-01-26 VITALS — BP 121/57 | HR 83 | Temp 98.8°F | Resp 18 | Wt 136.6 lb

## 2015-01-26 DIAGNOSIS — C482 Malignant neoplasm of peritoneum, unspecified: Secondary | ICD-10-CM | POA: Insufficient documentation

## 2015-01-26 DIAGNOSIS — J910 Malignant pleural effusion: Secondary | ICD-10-CM

## 2015-01-26 DIAGNOSIS — C569 Malignant neoplasm of unspecified ovary: Secondary | ICD-10-CM | POA: Insufficient documentation

## 2015-01-26 DIAGNOSIS — Z5111 Encounter for antineoplastic chemotherapy: Secondary | ICD-10-CM

## 2015-01-26 LAB — CBC WITH DIFFERENTIAL/PLATELET
Basophils Absolute: 0 10*3/uL (ref 0.0–0.1)
Basophils Relative: 0 % (ref 0–1)
EOS PCT: 1 % (ref 0–5)
Eosinophils Absolute: 0 10*3/uL (ref 0.0–0.7)
HCT: 33.5 % — ABNORMAL LOW (ref 36.0–46.0)
Hemoglobin: 10.4 g/dL — ABNORMAL LOW (ref 12.0–15.0)
LYMPHS ABS: 0.9 10*3/uL (ref 0.7–4.0)
LYMPHS PCT: 14 % (ref 12–46)
MCH: 28 pg (ref 26.0–34.0)
MCHC: 31 g/dL (ref 30.0–36.0)
MCV: 90.3 fL (ref 78.0–100.0)
Monocytes Absolute: 0.2 10*3/uL (ref 0.1–1.0)
Monocytes Relative: 3 % (ref 3–12)
NEUTROS ABS: 5.4 10*3/uL (ref 1.7–7.7)
Neutrophils Relative %: 82 % — ABNORMAL HIGH (ref 43–77)
PLATELETS: 209 10*3/uL (ref 150–400)
RBC: 3.71 MIL/uL — AB (ref 3.87–5.11)
RDW: 21 % — AB (ref 11.5–15.5)
WBC: 6.6 10*3/uL (ref 4.0–10.5)

## 2015-01-26 LAB — COMPREHENSIVE METABOLIC PANEL
ALT: 13 U/L (ref 0–35)
AST: 16 U/L (ref 0–37)
Albumin: 3.9 g/dL (ref 3.5–5.2)
Alkaline Phosphatase: 61 U/L (ref 39–117)
Anion gap: 3 — ABNORMAL LOW (ref 5–15)
BUN: 33 mg/dL — ABNORMAL HIGH (ref 6–23)
CO2: 27 mmol/L (ref 19–32)
Calcium: 8.7 mg/dL (ref 8.4–10.5)
Chloride: 109 mmol/L (ref 96–112)
Creatinine, Ser: 0.84 mg/dL (ref 0.50–1.10)
GFR calc Af Amer: 75 mL/min — ABNORMAL LOW (ref >90)
GFR calc non Af Amer: 65 mL/min — ABNORMAL LOW (ref >90)
Glucose, Bld: 130 mg/dL — ABNORMAL HIGH (ref 70–99)
Potassium: 3.9 mmol/L (ref 3.5–5.1)
Sodium: 139 mmol/L (ref 135–145)
Total Bilirubin: 0.5 mg/dL (ref 0.3–1.2)
Total Protein: 6.9 g/dL (ref 6.0–8.3)

## 2015-01-26 MED ORDER — PACLITAXEL PROTEIN-BOUND CHEMO INJECTION 100 MG: 80.0000 mg/m2 | Freq: Once | INTRAVENOUS | Status: DC

## 2015-01-26 MED ORDER — SODIUM CHLORIDE 0.9 % IV SOLN
145.0000 mg | Freq: Once | INTRAVENOUS | Status: AC
  Administered 2015-01-26: 150 mg via INTRAVENOUS
  Filled 2015-01-26: qty 15

## 2015-01-26 MED ORDER — HEPARIN SOD (PORK) LOCK FLUSH 100 UNIT/ML IV SOLN
500.0000 [IU] | Freq: Once | INTRAVENOUS | Status: AC | PRN
  Administered 2015-01-26: 500 [IU]

## 2015-01-26 MED ORDER — SODIUM CHLORIDE 0.9 % IV SOLN
Freq: Once | INTRAVENOUS | Status: AC
  Administered 2015-01-26: 12:00:00 via INTRAVENOUS
  Filled 2015-01-26: qty 5

## 2015-01-26 MED ORDER — SODIUM CHLORIDE 0.9 % IJ SOLN
10.0000 mL | INTRAMUSCULAR | Status: DC | PRN
  Administered 2015-01-26: 10 mL
  Filled 2015-01-26: qty 10

## 2015-01-26 MED ORDER — HEPARIN SOD (PORK) LOCK FLUSH 100 UNIT/ML IV SOLN
INTRAVENOUS | Status: AC
  Filled 2015-01-26: qty 5

## 2015-01-26 MED ORDER — DIPHENHYDRAMINE HCL 50 MG/ML IJ SOLN
50.0000 mg | Freq: Once | INTRAMUSCULAR | Status: AC
  Administered 2015-01-26: 50 mg via INTRAVENOUS
  Filled 2015-01-26: qty 1

## 2015-01-26 MED ORDER — SODIUM CHLORIDE 0.9 % IV SOLN
Freq: Once | INTRAVENOUS | Status: AC
  Administered 2015-01-26: 11:00:00 via INTRAVENOUS

## 2015-01-26 MED ORDER — PACLITAXEL PROTEIN-BOUND CHEMO INJECTION 100 MG
80.0000 mg/m2 | Freq: Once | INTRAVENOUS | Status: AC
  Administered 2015-01-26: 125 mg via INTRAVENOUS
  Filled 2015-01-26: qty 25

## 2015-01-26 MED ORDER — FAMOTIDINE IN NACL 20-0.9 MG/50ML-% IV SOLN
20.0000 mg | Freq: Once | INTRAVENOUS | Status: AC
  Administered 2015-01-26: 20 mg via INTRAVENOUS
  Filled 2015-01-26: qty 50

## 2015-01-26 MED ORDER — PALONOSETRON HCL INJECTION 0.25 MG/5ML
0.2500 mg | Freq: Once | INTRAVENOUS | Status: AC
  Administered 2015-01-26: 0.25 mg via INTRAVENOUS
  Filled 2015-01-26: qty 5

## 2015-01-26 NOTE — Patient Instructions (Signed)
[facility] Discharge Instructions for Patients Receiving Chemotherapy  Today you received the following chemotherapy agents Abraxane and Carboplatin. To help prevent nausea and vomiting after your treatment, we encourage you to take your nausea medication as instructed. If you develop nausea and vomiting that is not controlled by your nausea medication, call the clinic. If it is after clinic hours your family physician or the after hours number for the clinic or go to the Emergency Department. BELOW ARE SYMPTOMS THAT SHOULD BE REPORTED IMMEDIATELY:  *FEVER GREATER THAN 101.0 F  *CHILLS WITH OR WITHOUT FEVER  NAUSEA AND VOMITING THAT IS NOT CONTROLLED WITH YOUR NAUSEA MEDICATION  *UNUSUAL SHORTNESS OF BREATH  *UNUSUAL BRUISING OR BLEEDING  TENDERNESS IN MOUTH AND THROAT WITH OR WITHOUT PRESENCE OF ULCERS  *URINARY PROBLEMS  *BOWEL PROBLEMS  UNUSUAL RASH Items with * indicate a potential emergency and should be followed up as soon as possible.  Return as scheduled.  I have been informed and understand all the instructions given to me. I know to contact the clinic, my physician, or go to the Emergency Department if any problems should occur. I do not have any questions at this time, but understand that I may call the clinic during office hours or the Patient Navigator at [phone] should I have any questions or need assistance in obtaining follow up care.    __________________________________________  _____________  __________ Signature of Patient or Authorized Representative            Date                   Time    __________________________________________ Nurse's Signature

## 2015-01-26 NOTE — Progress Notes (Signed)
Sarah Bogus, MD [name] 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 BRCA2 is positive on tumor cells present in the pleural fluid making the patient a candidate for Olaparib as second line treatment should current regimen become ineffective or too toxic. Began Carboplatin/Paclitaxel on 09/22/2014. On day 8 cycle 3 Abraxane will be substituted for Taxol.  INTERVAL HISTORY: [name] 80 y.o. female returns for follow-up of her peritoneal carcinomatosis. She is doing well. She is off oxygen. Her pleurx catheters have been removed. She really has few complaints today.  She has occasional abdominal tenderness. Constipation is improved. She is here for cycle #5 of Carboplatin/Abraxane.  She has no major complaints today. She has intermittent trouble sleeping she says this is really not a new issue. She may be going home from the [place] [facility] on Friday and she is very pleased with that.   MEDICAL HISTORY: Past Medical History  Diagnosis Date  . GERD (gastroesophageal reflux disease)   . Osteoarthritis   . Peripheral edema   . CHF (congestive heart failure), NYHA class IV     diastolic  . Cancer     has GERD; CONSTIPATION, CHRONIC; HEMATOCHEZIA; OSTEOARTHRITIS, LOWER LEG; KNEE PAIN; BURSITIS, KNEE; Dysphagia; Malignant pleural effusion; Edema of both legs; Heart failure, diastolic, with acute decompensation; Hyperkalemia; Anemia, unspecified; Peritoneal carcinoma; Rectal bleed; Chest pain; Protein-calorie malnutrition, severe; Dysphagia, pharyngoesophageal phase; Constipation; Acute on chronic congestive heart failure; Acute on chronic respiratory failure with hypoxia; Ovarian cancer;  Anemia due to chemotherapy; and Cellulitis on her problem list.      Ovarian cancer   07/25/2014 Pathology Results Left pleural effusion positive for malignant cell consistent with adenocarcinoma.   07/27/2014 - 08/05/2014 Hospital Admission Heart failure with malignant pleural effusion   07/28/2014 Pathology Results Left pleural effusion positive for malignant cells consistent with adenocarcinoma (CK7 pos, CK20 neg, WT-1 pos, ER weakly pos, PR neg, TTF-1 neg, GCDFP-15 neg).  Findings favor gyn/ovarian primary.   08/01/2014 Pathology Results [facility] genomic findings: BRCA2 [id number]*31, STK11 loss, MYC amplification-equivocal, TP53 R273C, MLL2 QU2353.  Targeted treatments include Olaparib (BRCA2) and Everolimus/Temsirolimus (STK11).   08/03/2014 Tumor Marker CA 125 4954   08/04/2014 Procedure Left pleurX catheter by Dr. VLucianne LeiTright   09/08/2014 - 09/14/2014 Hospital Admission Hospitalized with the following main issues: acute on chronic CHF, acute on chronic respiratory failure with hypoxia, and malignant plueral effusion on right    09/13/2014 Procedure Right PleurX catheter by Dr. BCyndia Bent  09/13/2014 Pathology Results Right pleural effusion positive for malignant adenocarcinoma   09/21/2014 Tumor Marker CA 125 [id number]   09/22/2014 - 11/17/2014 Chemotherapy Carboplatin/Paclitaxel days 1, 8, 15 every 28 days.   10/20/2014 Tumor Marker CA 125 335   11/23/2014 -  Chemotherapy Carboplatin/Abraxane day 1, 8, 15 every 28 days.     is allergic to penicillins.  Ms. BHotarddoes not currently have medications on file.  SURGICAL HISTORY: Past Surgical History  Procedure Laterality Date  . Cholecystectomy    . Cardiac catheterization    . Total abdominal hysterectomy  40 years ago    patient does not know if ovaries were removed  . Appendectomy    . Total knee arthroplasty Left   . Chest tube insertion Left 08/04/2014  Procedure: INSERTION PLEURAL DRAINAGE CATHETER;  Surgeon: Ivin Poot, MD;   Location: [place];  Service: Thoracic;  Laterality: Left;  . Chest tube insertion Right 09/13/2014    Procedure: INSERTION PLEURAL DRAINAGE CATHETER;  Surgeon: Gaye Pollack, MD;  Location: [place];  Service: Thoracic;  Laterality: Right;  . Removal of pleural drainage catheter Bilateral 12/06/2014    Procedure: REMOVAL OF PLEURAL DRAINAGE CATHETER;  Surgeon: Ivin Poot, MD;  Location: [place];  Service: Thoracic;  Laterality: Bilateral;    SOCIAL HISTORY: History   Social History  . Marital Status: Widowed    Spouse Name: N/A  . Number of Children: N/A  . Years of Education: N/A   Occupational History  . nurses aid    Social History Main Topics  . Smoking status: Never Smoker   . Smokeless tobacco: Not on file  . Alcohol Use: No  . Drug Use: No  . Sexual Activity: Not on file   Other Topics Concern  . Not on file   Social History Narrative    FAMILY HISTORY: Noncontributory   Review of Systems  Constitutional: Positive for malaise/fatigue. Negative for fever, chills and weight loss.  HENT: Negative for congestion, hearing loss, nosebleeds, sore throat and tinnitus.   Eyes: Negative for blurred vision, double vision, pain and discharge.  Respiratory: Negative for cough, hemoptysis, sputum production, shortness of breath and wheezing.   Cardiovascular: Negative for chest pain, palpitations, claudication, leg swelling and PND.  Gastrointestinal: Positive for constipation. Negative for heartburn, nausea, vomiting, abdominal pain, diarrhea, blood in stool and melena.  Genitourinary: Negative for dysuria, urgency, frequency and hematuria.  Musculoskeletal: Negative for myalgias, joint pain and falls.  Skin: Negative for itching and rash.  Neurological: Positive for weakness. Negative for dizziness, tingling, tremors, sensory change, speech change, focal weakness, seizures, loss of consciousness and headaches.  Endo/Heme/Allergies: Does not bruise/bleed easily.    Psychiatric/Behavioral: Negative for depression, suicidal ideas, memory loss and substance abuse. The patient is nervous/anxious and has insomnia.     PHYSICAL EXAMINATION  ECOG PERFORMANCE STATUS: 1 - Symptomatic but completely ambulatory  Filed Vitals:   01/26/15 1006  BP: 121/57  Pulse: 83  Temp: 98.8 F (37.1 C)  Resp: 18    Physical Exam  Constitutional: She is oriented to person, place, and time and well-developed, well-nourished, and in no distress.  HENT:  Head: Normocephalic and atraumatic.  Mouth/Throat: No oropharyngeal exudate.  Eyes: Conjunctivae and EOM are normal. Pupils are equal, round, and reactive to light. No scleral icterus.  Neck: Normal range of motion. Neck supple. No JVD present. No thyromegaly present.  Cardiovascular: Normal rate and regular rhythm.  Exam reveals no friction rub.   No murmur heard. Pulmonary/Chest: Effort normal and breath sounds normal. No respiratory distress. She has no wheezes. She has no rales.  Abdominal: Soft. Bowel sounds are normal. She exhibits no distension. There is no tenderness. There is no rebound and no guarding.  Musculoskeletal: Normal range of motion.  Lymphadenopathy:    She has no cervical adenopathy.  Neurological: She is alert and oriented to person, place, and time. No cranial nerve deficit. Coordination normal.  Skin: Skin is warm and dry.  Psychiatric: Mood, memory, affect and judgment normal.    LABORATORY DATA: Results for Sarah, Duke (MRN [id number]) as of 02/14/2015 16:49  Ref. Range 01/26/2015 10:10  Sodium Latest Range: 135-145 mmol/L 139  Potassium Latest Range: 3.5-5.1 mmol/L 3.9  Chloride Latest Range: 96-112 mmol/L 109  CO2 Latest Range: 19-32 mmol/L 27  BUN Latest Range: 6-23 mg/dL 33 (H)  Creatinine Latest Range: 0.50-1.10 mg/dL 0.84  Calcium Latest Range: 8.4-10.5 mg/dL 8.7  GFR calc non Af Amer Latest Range: >90 mL/min 65 (L)  GFR calc Af Amer Latest Range: >90 mL/min 75 (L)  Glucose  Latest Range: 70-99 mg/dL 130 (H)  Anion gap Latest Range: 5-15  3 (L)  Alkaline Phosphatase Latest Range: 39-117 U/L 61  Albumin Latest Range: 3.5-5.2 g/dL 3.9  AST Latest Range: 0-37 U/L 16  ALT Latest Range: 0-35 U/L 13  Total Protein Latest Range: 6.0-8.3 g/dL 6.9  Total Bilirubin Latest Range: 0.3-1.2 mg/dL 0.5  WBC Latest Range: 4.0-10.5 K/uL 6.6  RBC Latest Range: 3.87-5.11 MIL/uL 3.71 (L)  Hemoglobin Latest Range: 12.0-15.0 g/dL 10.4 (L)  HCT Latest Range: 36.0-46.0 % 33.5 (L)  MCV Latest Range: 78.0-100.0 fL 90.3  MCH Latest Range: 26.0-34.0 pg 28.0  MCHC Latest Range: 30.0-36.0 g/dL 31.0  RDW Latest Range: 11.5-15.5 % 21.0 (H)  Platelets Latest Range: 150-400 K/uL 209  Neutrophils Relative % Latest Range: 43-77 % 82 (H)  Lymphocytes Relative Latest Range: 12-46 % 14  Monocytes Relative Latest Range: 3-12 % 3  Eosinophils Relative Latest Range: 0-5 % 1  Basophils Relative Latest Range: 0-1 % 0  NEUT# Latest Range: 1.7-7.7 K/uL 5.4  Lymphocytes Absolute Latest Range: 0.7-4.0 K/uL 0.9  Monocytes Absolute Latest Range: 0.1-1.0 K/uL 0.2  Eosinophils Absolute Latest Range: 0.0-0.7 K/uL 0.0  Basophils Absolute Latest Range: 0.0-0.1 K/uL 0.0   ASSESSMENT and THERAPY PLAN:   Primary peritoneal carcinoma  80 year old female with stage IV primary peritoneal carcinoma. She has done remarkably well with treatment and is here today for cycle 5. We will plan on seeing her back prior to cycle 6 of therapy. She was diagnosed with a malignant pleural effusion, she had bilateral Pleuryx catheters in. They are now out, she is off O2, and is up ambulating regularly at the [facility]. Plans are hopefully for discharge back home this Friday.  All questions were answered. The patient knows to call the clinic with any problems, questions or concerns. We can certainly see the patient much sooner if necessary. This note was signed electronically Molli Hazard 01/26/2015

## 2015-01-26 NOTE — Progress Notes (Signed)
Hemoglobin 10.4. Aranesp not given, treatment parameters not met. Tolerated chemo well.

## 2015-01-26 NOTE — Patient Instructions (Addendum)
[facility] Discharge Instructions for Patients Receiving Chemotherapy  Today you received the following chemotherapy agents:  Abraxane and carboplatin Return as scheduled for chemotherapy and office visits.   If you develop nausea and vomiting, or diarrhea that is not controlled by your medication, call the clinic.  The clinic phone number is [phone]. Office hours are Monday-Friday 8:30am-5:00pm.  BELOW ARE SYMPTOMS THAT SHOULD BE REPORTED IMMEDIATELY:  *FEVER GREATER THAN 101.0 F  *CHILLS WITH OR WITHOUT FEVER  NAUSEA AND VOMITING THAT IS NOT CONTROLLED WITH YOUR NAUSEA MEDICATION  *UNUSUAL SHORTNESS OF BREATH  *UNUSUAL BRUISING OR BLEEDING  TENDERNESS IN MOUTH AND THROAT WITH OR WITHOUT PRESENCE OF ULCERS  *URINARY PROBLEMS  *BOWEL PROBLEMS  UNUSUAL RASH Items with * indicate a potential emergency and should be followed up as soon as possible. If you have an emergency after office hours please contact your primary care physician or go to the nearest emergency department.  Please call the clinic during office hours if you have any questions or concerns.   You may also contact the Patient Navigator at [phone] should you have any questions or need assistance in obtaining follow up care. _____________________________________________________________________ Have you asked about our STAR program?    STAR stands for Survivorship Training and Rehabilitation, and this is a nationally recognized cancer care program that focuses on survivorship and rehabilitation.  Cancer and cancer treatments may cause problems, such as, pain, making you feel tired and keeping you from doing the things that you need or want to do. Cancer rehabilitation can help. Our goal is to reduce these troubling effects and help you have the best quality of life possible.  You may receive a survey from a nurse that asks questions about your current state of health.  Based on the survey  results, all eligible patients will be referred to the [facility] program for an evaluation so we can better serve you! A frequently asked questions sheet is available upon request.

## 2015-01-27 ENCOUNTER — Encounter: Payer: Self-pay | Admitting: Internal Medicine

## 2015-01-27 ENCOUNTER — Non-Acute Institutional Stay (SKILLED_NURSING_FACILITY): Payer: Medicare HMO | Admitting: Internal Medicine

## 2015-01-27 ENCOUNTER — Other Ambulatory Visit: Payer: Self-pay | Admitting: *Deleted

## 2015-01-27 ENCOUNTER — Encounter (HOSPITAL_COMMUNITY)
Admission: RE | Admit: 2015-01-27 | Discharge: 2015-01-27 | Disposition: A | Discharge status: Home / Self Care | Payer: Medicare HMO | Source: Ambulatory Visit | Attending: Hematology | Admitting: Hematology

## 2015-01-27 DIAGNOSIS — C482 Malignant neoplasm of peritoneum, unspecified: Secondary | ICD-10-CM | POA: Diagnosis not present

## 2015-01-27 DIAGNOSIS — K5909 Other constipation: Secondary | ICD-10-CM

## 2015-01-27 DIAGNOSIS — J910 Malignant pleural effusion: Secondary | ICD-10-CM

## 2015-01-27 DIAGNOSIS — I5033 Acute on chronic diastolic (congestive) heart failure: Secondary | ICD-10-CM

## 2015-01-27 DIAGNOSIS — C569 Malignant neoplasm of unspecified ovary: Secondary | ICD-10-CM

## 2015-01-27 LAB — BASIC METABOLIC PANEL
Anion gap: 5 (ref 5–15)
BUN: 33 mg/dL — AB (ref 6–23)
CHLORIDE: 108 mmol/L (ref 96–112)
CO2: 29 mmol/L (ref 19–32)
Calcium: 8.7 mg/dL (ref 8.4–10.5)
Creatinine, Ser: 0.82 mg/dL (ref 0.50–1.10)
GFR calc Af Amer: 77 mL/min — ABNORMAL LOW (ref >90)
GFR, EST NON AFRICAN AMERICAN: 67 mL/min — AB (ref >90)
Glucose, Bld: 102 mg/dL — ABNORMAL HIGH (ref 70–99)
Potassium: 3.9 mmol/L (ref 3.5–5.1)
SODIUM: 142 mmol/L (ref 135–145)

## 2015-01-27 MED ORDER — OXYCODONE HCL 5 MG PO TABS: ORAL_TABLET | ORAL | Status: DC

## 2015-01-27 NOTE — Telephone Encounter (Signed)
[place] # [phone] Fax: [phone]

## 2015-01-27 NOTE — Progress Notes (Signed)
Patient ID: [name], female   DOB: July 16, 1935, 80 y.o.   MRN: [id number]   this is a discharge note.   Level care skilled.  Facility [facility].  Chief complaint Discharge note  History of present illness.  Patient is a very pleasant 80year-old female with a history of metastatic ovarian cancer-she does have a history of peritoneal carcinoma with malignant effusions bilaterally.  She has had drains previously for the effusions but these were removed earlier this week-clinically she appears stable although very frail.  She is followed by oncology and continues to receive chemotherapy which of course is followed by oncology  She actually has done very well here recently she has gained a significant amount of weight I suspect this is due to a better appetite-she is still receiving chemotherapy but appears to be tolerating this well in fact she just saw her oncologist yesterday.  Her vital signs remained stable she will be going home and has family very close by who is very attentive she will need continued PT and OT for strengthening she has gained a significant amount of strength while here and is now ambulatory also will need home health support for follow-up   .  She does have a history of diastolic CHF --she is on low-dose Lasix-she does not complain of any increased shortness breath or chest pain I suspect recent weight gain is due to a much better appetite .     She also has a history of considerable constipation but this appears actually to have improved she is on Colace MiraLAX and lactulose.  She also has significant dysphagia GERD symptoms but this appears to have stabilized on the Reglan protonix.  For pain management she continues on OxyContin and this appears to help as well she appears comfortable today   Family medical social history  been reviewed per previous oncology notes as well as readmission note on 09/19/2014.  Medications have been reviewed per  [facility] Colace 100 mg twice a day.  Lactulose 15 mL twice a day when necessary.  Lasix 20 mg a day.  MiraLAX daily.  Oxycodone 5-10 milligrams every 4 hours when necessary.  Prednisone 10 mg daily.  Protonix daily.  Reglan 5 mg 4 times a day.  Potassium 30 mEq twice a day.  Elocon cream to affected areas  Review of systems.  General does not complain of any fever or chills.  Eyes no complaints of visual changes.  Skin erythema lower right leg appears to be resolved status post antibiotic  Does not complain of any itching does have history of upper extremity rash she is receiving a topical cream apparently with relief.  Head ears eyes nose mouth and throat-does not complain of any sore throat or.  Respiratory does not complain of shortness of breath or cough.  Cardiac does not complain of chest pain has very minimal lower extremity edema this appears.  GU does not complain of dysuria.  GI-does not complaining of acute abdominal pain --did have some chronic lower abdominal pain and oncology has evaluated this-again she is on numerous motility agents.  Musculoskeletal does not complain of joint pain or acute pain at this time this appears to be controlled.  Neurologic does not complain of dizziness or headache or syncopal-type feelings.  Psych continues to be in good spirits.  Physical exam.   Temperature 97.1 pulse 90 respirations 18 blood pressure 124/74 weight is 137.1  In general this is a frail elderly female in no distress--however she  appears significantly stronger than a couple months ago   Her skin is warm and dry      Eyes pupils appear reactive light   oropharynx is clear mucous membranes moist  Chest  Clear to auscultation there is no labored breathing  Heart is regular rate and rhythm without murmur gallop or rub she does not really have significant lower extremity.  Abdomen is soft some mild tenderness to palpation lower abdomen this is not  new-bowel sounds are active.  Musculoskeletal  Still has some frailty but is doing much better is actually ambulating-I do not note any deformities strength actually appears to be increasing  Neurologic is grossly intact her speech is clear.  Psych she is alert and oriented doesn't and appropriate which is her baseline.  Labs.  01/27/2015.  Sodium 142 potassium 3.9 BUN 33 creatinine 0.82.  01/26/2015.  WBC 6.6 hemoglobin 10.4 platelets 209  11/30/2014.  Iron 26-total iron-binding capacity to 90-ferritin 132-this was obtained at oncology.  WBC 5.4 hemoglobin 9.5 platelets 226.  Sodium 139 potassium 3.8 BUN 23 creatinine 0.73.  Albumin 3.4-otherwise liver function tests within normal limits.  Assessment plan.     #1 history of peritoneal cancer ovarian with metastasis-she is followed closely by oncology-is receiving chemotherapy- appears to be doing quite well has gained a significant amount of strength-will need continued PT OT at home as well as home health support with nursing   #0-KXFGHWE of diastolic CHF-this appears compensated on low-dose Lasix-oncology appears to be following her labs quite closely recent metabolic panel appear to be fairly unremarkable.  #3-history of anemia chemotherapy induced-t appears recent hemoglobins per chart review have run between 9-10,s-this is followed closely by oncology --she has received Aranesp as needed apparently for hemoglobin below 10  #4-history of dysphagia-GERD-she is on numerous agents including Reglan and Protonix-at this point appears to be effective-.  She also has constipation but this is stable on current agents including lactulose MiraLAX and Colace.  #6-Painmanagement -- continues on OxyIR every 4 hours 5-10 milligrams as needed-she appears to be doing quite well with this which is encouraging  #7-history of rashs-this appears to be resolved stable-she is on Elocon cream-also on low-dose prednisone-there is some  thought this may be chemotherapy medication related as well--per review of oncology note    [id number]-of note greater than 30 minutes spent on this discharge summary

## 2015-02-09 ENCOUNTER — Encounter (HOSPITAL_COMMUNITY): Payer: Medicare HMO | Attending: Hematology

## 2015-02-09 ENCOUNTER — Encounter (HOSPITAL_COMMUNITY): Payer: Self-pay | Admitting: Hematology & Oncology

## 2015-02-09 ENCOUNTER — Encounter (HOSPITAL_BASED_OUTPATIENT_CLINIC_OR_DEPARTMENT_OTHER): Payer: Medicare HMO | Admitting: Hematology & Oncology

## 2015-02-09 VITALS — BP 116/63 | HR 70 | Temp 98.1°F | Resp 18 | Wt 138.8 lb

## 2015-02-09 DIAGNOSIS — D631 Anemia in chronic kidney disease: Secondary | ICD-10-CM | POA: Insufficient documentation

## 2015-02-09 DIAGNOSIS — J90 Pleural effusion, not elsewhere classified: Secondary | ICD-10-CM

## 2015-02-09 DIAGNOSIS — N189 Chronic kidney disease, unspecified: Secondary | ICD-10-CM | POA: Insufficient documentation

## 2015-02-09 DIAGNOSIS — C482 Malignant neoplasm of peritoneum, unspecified: Secondary | ICD-10-CM

## 2015-02-09 DIAGNOSIS — Z5111 Encounter for antineoplastic chemotherapy: Secondary | ICD-10-CM

## 2015-02-09 DIAGNOSIS — C569 Malignant neoplasm of unspecified ovary: Secondary | ICD-10-CM

## 2015-02-09 DIAGNOSIS — D6481 Anemia due to antineoplastic chemotherapy: Secondary | ICD-10-CM

## 2015-02-09 DIAGNOSIS — M545 Low back pain: Secondary | ICD-10-CM

## 2015-02-09 DIAGNOSIS — R600 Localized edema: Secondary | ICD-10-CM

## 2015-02-09 DIAGNOSIS — R0781 Pleurodynia: Secondary | ICD-10-CM

## 2015-02-09 DIAGNOSIS — T451X5A Adverse effect of antineoplastic and immunosuppressive drugs, initial encounter: Secondary | ICD-10-CM

## 2015-02-09 LAB — CBC
HCT: 34.4 % — ABNORMAL LOW (ref 36.0–46.0)
Hemoglobin: 10.6 g/dL — ABNORMAL LOW (ref 12.0–15.0)
MCH: 28.3 pg (ref 26.0–34.0)
MCHC: 30.8 g/dL (ref 30.0–36.0)
MCV: 91.7 fL (ref 78.0–100.0)
Platelets: 170 10*3/uL (ref 150–400)
RBC: 3.75 MIL/uL — ABNORMAL LOW (ref 3.87–5.11)
RDW: 19.7 % — ABNORMAL HIGH (ref 11.5–15.5)
WBC: 6.7 10*3/uL (ref 4.0–10.5)

## 2015-02-09 LAB — URINALYSIS, ROUTINE W REFLEX MICROSCOPIC
Bilirubin Urine: NEGATIVE
GLUCOSE, UA: NEGATIVE mg/dL
Ketones, ur: NEGATIVE mg/dL
Leukocytes, UA: NEGATIVE
NITRITE: NEGATIVE
PH: 5 (ref 5.0–8.0)
Protein, ur: NEGATIVE mg/dL
Specific Gravity, Urine: 1.01 (ref 1.005–1.030)
UROBILINOGEN UA: 0.2 mg/dL (ref 0.0–1.0)

## 2015-02-09 LAB — COMPREHENSIVE METABOLIC PANEL
ALT: 15 U/L (ref 0–35)
AST: 18 U/L (ref 0–37)
Albumin: 4 g/dL (ref 3.5–5.2)
Alkaline Phosphatase: 68 U/L (ref 39–117)
Anion gap: 8 (ref 5–15)
BUN: 33 mg/dL — AB (ref 6–23)
CALCIUM: 9 mg/dL (ref 8.4–10.5)
CO2: 28 mmol/L (ref 19–32)
Chloride: 103 mmol/L (ref 96–112)
Creatinine, Ser: 0.95 mg/dL (ref 0.50–1.10)
GFR, EST AFRICAN AMERICAN: 65 mL/min — AB (ref >90)
GFR, EST NON AFRICAN AMERICAN: 56 mL/min — AB (ref >90)
GLUCOSE: 121 mg/dL — AB (ref 70–99)
Potassium: 4.2 mmol/L (ref 3.5–5.1)
Sodium: 139 mmol/L (ref 135–145)
Total Bilirubin: 0.6 mg/dL (ref 0.3–1.2)
Total Protein: 7 g/dL (ref 6.0–8.3)

## 2015-02-09 LAB — URINE MICROSCOPIC-ADD ON

## 2015-02-09 MED ORDER — OXYCODONE HCL 5 MG PO TABS: ORAL_TABLET | ORAL | Status: DC

## 2015-02-09 MED ORDER — PALONOSETRON HCL INJECTION 0.25 MG/5ML
0.2500 mg | Freq: Once | INTRAVENOUS | Status: AC
  Administered 2015-02-09: 0.25 mg via INTRAVENOUS
  Filled 2015-02-09: qty 5

## 2015-02-09 MED ORDER — PACLITAXEL PROTEIN-BOUND CHEMO INJECTION 100 MG: 80.0000 mg/m2 | Freq: Once | INTRAVENOUS | Status: DC

## 2015-02-09 MED ORDER — SODIUM CHLORIDE 0.9 % IV SOLN
145.0000 mg | Freq: Once | INTRAVENOUS | Status: AC
  Administered 2015-02-09: 150 mg via INTRAVENOUS
  Filled 2015-02-09: qty 15

## 2015-02-09 MED ORDER — FAMOTIDINE IN NACL 20-0.9 MG/50ML-% IV SOLN
20.0000 mg | Freq: Once | INTRAVENOUS | Status: AC
  Administered 2015-02-09: 20 mg via INTRAVENOUS
  Filled 2015-02-09: qty 50

## 2015-02-09 MED ORDER — PACLITAXEL PROTEIN-BOUND CHEMO INJECTION 100 MG
80.0000 mg/m2 | Freq: Once | INTRAVENOUS | Status: AC
Start: 2015-02-09 — End: 2015-02-09
  Administered 2015-02-09: 125 mg via INTRAVENOUS
  Filled 2015-02-09: qty 25

## 2015-02-09 MED ORDER — SODIUM CHLORIDE 0.9 % IV SOLN
Freq: Once | INTRAVENOUS | Status: AC
  Administered 2015-02-09: 11:00:00 via INTRAVENOUS

## 2015-02-09 MED ORDER — DIPHENHYDRAMINE HCL 50 MG/ML IJ SOLN
50.0000 mg | Freq: Once | INTRAMUSCULAR | Status: AC
  Administered 2015-02-09: 50 mg via INTRAVENOUS
  Filled 2015-02-09: qty 1

## 2015-02-09 MED ORDER — SODIUM CHLORIDE 0.9 % IV SOLN
Freq: Once | INTRAVENOUS | Status: AC
  Administered 2015-02-09: 12:00:00 via INTRAVENOUS
  Filled 2015-02-09: qty 5

## 2015-02-09 MED ORDER — SODIUM CHLORIDE 0.9 % IJ SOLN: 10.0000 mL | INTRAMUSCULAR | Status: DC | PRN

## 2015-02-09 MED ORDER — HEPARIN SOD (PORK) LOCK FLUSH 100 UNIT/ML IV SOLN
500.0000 [IU] | Freq: Once | INTRAVENOUS | Status: AC | PRN
  Administered 2015-02-09: 500 [IU]
  Filled 2015-02-09: qty 5

## 2015-02-09 NOTE — Patient Instructions (Addendum)
[place] at [facility]  Discharge Instructions:  You saw Dr Whitney Muse today.  You are on your last cycle of your chemotherapy.  You will follow up with the doctor in four weeks.  Please call us when you get home about the pain medicine at [phone]. _______________________________________________________________  Thank you for choosing [place] at [facility] to provide your oncology and hematology care.  To afford each patient quality time with our providers, please arrive at least 15 minutes before your scheduled appointment.  You need to re-schedule your appointment if you arrive 10 or more minutes late.  We strive to give you quality time with our providers, and arriving late affects you and other patients whose appointments are after yours.  Also, if you no show three or more times for appointments you may be dismissed from the clinic.  Again, thank you for choosing [place] [facility] [place] hope is that these requests will allow you access to exceptional care and in a timely manner. _______________________________________________________________  If you have questions after your visit, please contact our office at [phone] between the hours of 8:30 a.m. and 5:00 p.m. Voicemails left after 4:30 p.m. will not be returned until the following business day. _______________________________________________________________  For prescription refill requests, have your pharmacy contact our office. _______________________________________________________________  Recommendations made by the consultant and any test results will be sent to your referring physician. _______________________________________________________________

## 2015-02-09 NOTE — Progress Notes (Signed)
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 BRCA2 is positive on tumor cells present in the pleural fluid making the patient a candidate for Olaparib as second line treatment should current regimen become ineffective or too toxic. Began Carboplatin/Paclitaxel on 09/22/2014. On day 8 cycle 3 Abraxane substituted for Taxol.  INTERVAL HISTORY: [name] 80 y.o. female returns for follow-up of her primary peritoneal carcinoma. [facility] She is doing quite well. She describes her appetite is good. She walks around the house; occasionally with a cane. She has been going out side as well. She denies shortness of breath. She has some left-sided rib pain with movement and deep inspiration. But other than that, she has no major complaints.   MEDICAL HISTORY: Past Medical History  Diagnosis Date  . GERD (gastroesophageal reflux disease)   . Osteoarthritis   . Peripheral edema   . CHF (congestive heart failure), NYHA class IV     diastolic  . Cancer     has GERD; CONSTIPATION, CHRONIC; HEMATOCHEZIA; OSTEOARTHRITIS, LOWER LEG; KNEE PAIN; BURSITIS, KNEE; Dysphagia; Malignant pleural effusion; Edema of both legs; Heart failure, diastolic, with acute decompensation; Hyperkalemia; Anemia, unspecified; Peritoneal carcinoma; Rectal bleed; Chest pain; Protein-calorie malnutrition, severe; Dysphagia, pharyngoesophageal phase; Constipation; Acute on chronic congestive heart failure; Acute on chronic respiratory failure with hypoxia; Ovarian cancer; Anemia due to chemotherapy; and Cellulitis on her problem list.      Ovarian cancer   07/25/2014  Pathology Results Left pleural effusion positive for malignant cell consistent with adenocarcinoma.   07/27/2014 - 08/05/2014 Hospital Admission Heart failure with malignant pleural effusion   07/28/2014 Pathology Results Left pleural effusion positive for malignant cells consistent with adenocarcinoma (CK7 pos, CK20 neg, WT-1 pos, ER weakly pos, PR neg, TTF-1 neg, GCDFP-15 neg).  Findings favor gyn/ovarian primary.   08/01/2014 Pathology Results [facility] genomic findings: BRCA2 [id number], STK11 loss, MYC amplification-equivocal, TP53 R273C, MLL2 QI5027.  Targeted treatments include Olaparib (BRCA2) and Everolimus/Temsirolimus (STK11).   08/03/2014 Tumor Marker CA 125 4954   08/04/2014 Procedure Left pleurX catheter by Dr. VLucianne LeiTright   09/08/2014 - 09/14/2014 Hospital Admission Hospitalized with the following main issues: acute on chronic CHF, acute on chronic respiratory failure with hypoxia, and malignant plueral effusion on right    09/13/2014 Procedure Right PleurX catheter by Dr. BCyndia Bent  09/13/2014 Pathology Results Right pleural effusion positive for malignant adenocarcinoma   09/21/2014 Tumor Marker CA 125 3869   09/22/2014 - 11/17/2014 Chemotherapy Carboplatin/Paclitaxel days 1, 8, 15 every 28 days.   10/20/2014 Tumor Marker CA 125 335   11/23/2014 -  Chemotherapy Carboplatin/Abraxane day 1, 8, 15 every 28 days.     is allergic to penicillins.  Ms. BBertlinghad no medications administered during this visit.  SURGICAL HISTORY: Past Surgical History  Procedure Laterality Date  . Cholecystectomy    . Cardiac catheterization    . Total abdominal hysterectomy  40 years ago    patient does not know if ovaries were removed  . Appendectomy    . Total knee arthroplasty Left   . Chest tube insertion Left 08/04/2014    Procedure: INSERTION PLEURAL DRAINAGE CATHETER;  Surgeon: PIvin Poot MD;  Location: [place]  Service: Thoracic;  Laterality: Left;  . Chest tube insertion Right  09/13/2014    Procedure: INSERTION PLEURAL DRAINAGE CATHETER;  Surgeon: Gaye Pollack, MD;  Location: [place];  Service: Thoracic;  Laterality: Right;  . Removal of pleural drainage catheter Bilateral 12/06/2014    Procedure: REMOVAL OF PLEURAL DRAINAGE CATHETER;  Surgeon: Ivin Poot, MD;  Location: [place];  Service: Thoracic;  Laterality: Bilateral;    SOCIAL HISTORY: History   Social History  . Marital Status: Widowed    Spouse Name: N/A  . Number of Children: N/A  . Years of Education: N/A   Occupational History  . nurses aid    Social History Main Topics  . Smoking status: Never Smoker   . Smokeless tobacco: Not on file  . Alcohol Use: No  . Drug Use: No  . Sexual Activity: Not on file   Other Topics Concern  . Not on file   Social History Narrative    FAMILY HISTORY: Noncontributory   Review of Systems  Constitutional: Negative for fever, chills, weight loss and malaise/fatigue.  HENT: Negative for congestion, hearing loss, nosebleeds, sore throat and tinnitus.   Eyes: Negative for blurred vision, double vision, pain and discharge.  Respiratory: Negative for cough, hemoptysis, sputum production, shortness of breath and wheezing.   Cardiovascular: Negative for chest pain, palpitations, claudication, leg swelling and PND.  Gastrointestinal: Negative for heartburn, nausea, vomiting, abdominal pain, diarrhea, constipation, blood in stool and melena.  Genitourinary: Negative for dysuria, urgency, frequency and hematuria.  Musculoskeletal: Positive for back pain. Negative for myalgias, joint pain and falls.  Skin: Negative for itching and rash.  Neurological: Negative for dizziness, tingling, tremors, sensory change, speech change, focal weakness, seizures, loss of consciousness, weakness and headaches.  Endo/Heme/Allergies: Does not bruise/bleed easily.  Psychiatric/Behavioral: Negative for depression, suicidal ideas, memory loss and substance abuse. The patient is not  nervous/anxious and does not have insomnia.     PHYSICAL EXAMINATION  ECOG PERFORMANCE STATUS: 1 - Symptomatic but completely ambulatory  Filed Vitals:   02/09/15 0900  BP: 116/63  Pulse: 70  Temp: 98.1 F (36.7 C)  Resp: 18    Physical Exam  Constitutional: She is oriented to person, place, and time and well-developed, well-nourished, and in no distress.  HENT:  Head: Normocephalic and atraumatic.  Mouth/Throat: No oropharyngeal exudate.  Eyes: Conjunctivae and EOM are normal. Pupils are equal, round, and reactive to light. No scleral icterus.  Neck: Normal range of motion. Neck supple. No JVD present. No thyromegaly present.  Cardiovascular: Normal rate and regular rhythm.  Exam reveals no friction rub.   No murmur heard. Pulmonary/Chest: Effort normal and breath sounds normal. No respiratory distress. She has no wheezes. She has no rales.  Abdominal: Soft. Bowel sounds are normal. She exhibits no distension. There is tenderness. There is no rebound and no guarding.  Tenderness mild  Musculoskeletal: Normal range of motion.  Lymphadenopathy:    She has no cervical adenopathy.  Neurological: She is alert and oriented to person, place, and time. No cranial nerve deficit. Coordination normal.  Skin: Skin is warm and dry.  Psychiatric: Mood, memory, affect and judgment normal.    LABORATORY DATA:  CBC    Component Value Date/Time   WBC 6.7 02/09/2015 0950   RBC 3.75* 02/09/2015 0950   RBC 4.29 07/27/2014 2257   HGB 10.6* 02/09/2015 0950   HCT 34.4* 02/09/2015 0950   PLT 170 02/09/2015 0950   MCV 91.7 02/09/2015 0950  MCH 28.3 02/09/2015 0950   MCHC 30.8 02/09/2015 0950   RDW 19.7* 02/09/2015 0950   LYMPHSABS 0.9 01/26/2015 1010   MONOABS 0.2 01/26/2015 1010   EOSABS 0.0 01/26/2015 1010   BASOSABS 0.0 01/26/2015 1010   CMP     Component Value Date/Time   NA 139 02/09/2015 0950   K 4.2 02/09/2015 0950   CL 103 02/09/2015 0950   CO2 28 02/09/2015 0950    GLUCOSE 121* 02/09/2015 0950   BUN 33* 02/09/2015 0950   CREATININE 0.95 02/09/2015 0950   CALCIUM 9.0 02/09/2015 0950   PROT 7.0 02/09/2015 0950   ALBUMIN 4.0 02/09/2015 0950   AST 18 02/09/2015 0950   ALT 15 02/09/2015 0950   ALKPHOS 68 02/09/2015 0950   BILITOT 0.6 02/09/2015 0950   GFRNONAA 56* 02/09/2015 0950   GFRAA 65* 02/09/2015 0950    ASSESSMENT and THERAPY PLAN:    Peritoneal carcinoma 80 year old female with stage IV primary peritoneal carcinoma with a malignant left and right pleural effusion. She had bilateral Pleuryx catheters initially these have since been removed. In addition she is no longer oxygen dependent. She is back at home and functioning fairly independently. She is having some left-sided rib pain that hurts with movement. I have encouraged her to advise [place] should this pain worsen.  Clinically she has certainly improved her last CA 125 was normal. He is here for her last cycle of carboplatin and Taxol. We will see her back in 4 weeks at which time we will discuss ongoing surveillance. She is interim problems or concerns she is advised to call.   All questions were answered. The patient knows to call the clinic with any problems, questions or concerns. We can certainly see the patient much sooner if necessary.   Molli Hazard 02/09/2015

## 2015-02-09 NOTE — Progress Notes (Signed)
Tolerated well

## 2015-02-09 NOTE — Assessment & Plan Note (Signed)
80 year old female with stage IV primary peritoneal carcinoma with a malignant left and right pleural effusion. She had bilateral Pleuryx catheters initially these have since been removed. In addition she is no longer oxygen dependent. She is back at home and functioning fairly independently. She is having some left-sided rib pain that hurts with movement. I have encouraged her to advise [place] should this pain worsen.  Clinically she has certainly improved her last CA 125 was normal. He is here for her last cycle of carboplatin and Taxol. We will see her back in 4 weeks at which time we will discuss ongoing surveillance. She is interim problems or concerns she is advised to call.

## 2015-02-10 LAB — CA 125: CA 125: 16.1 U/mL (ref 0.0–34.0)

## 2015-02-16 ENCOUNTER — Encounter (HOSPITAL_COMMUNITY): Payer: Self-pay

## 2015-02-16 ENCOUNTER — Ambulatory Visit (HOSPITAL_COMMUNITY): Payer: Medicare HMO | Admitting: Hematology & Oncology

## 2015-02-16 ENCOUNTER — Encounter (HOSPITAL_BASED_OUTPATIENT_CLINIC_OR_DEPARTMENT_OTHER): Payer: Medicare HMO

## 2015-02-16 VITALS — BP 111/57 | HR 80 | Temp 97.9°F | Resp 20 | Wt 138.7 lb

## 2015-02-16 DIAGNOSIS — C482 Malignant neoplasm of peritoneum, unspecified: Secondary | ICD-10-CM

## 2015-02-16 DIAGNOSIS — M545 Low back pain: Secondary | ICD-10-CM

## 2015-02-16 DIAGNOSIS — J910 Malignant pleural effusion: Secondary | ICD-10-CM

## 2015-02-16 DIAGNOSIS — Z1501 Genetic susceptibility to malignant neoplasm of breast: Secondary | ICD-10-CM

## 2015-02-16 DIAGNOSIS — D6481 Anemia due to antineoplastic chemotherapy: Secondary | ICD-10-CM

## 2015-02-16 DIAGNOSIS — T451X5A Adverse effect of antineoplastic and immunosuppressive drugs, initial encounter: Secondary | ICD-10-CM

## 2015-02-16 DIAGNOSIS — Z5111 Encounter for antineoplastic chemotherapy: Secondary | ICD-10-CM

## 2015-02-16 DIAGNOSIS — C569 Malignant neoplasm of unspecified ovary: Secondary | ICD-10-CM

## 2015-02-16 LAB — DIFFERENTIAL
BASOS PCT: 1 % (ref 0–1)
Basophils Absolute: 0 10*3/uL (ref 0.0–0.1)
EOS PCT: 1 % (ref 0–5)
Eosinophils Absolute: 0.1 10*3/uL (ref 0.0–0.7)
Lymphocytes Relative: 20 % (ref 12–46)
Lymphs Abs: 1.3 10*3/uL (ref 0.7–4.0)
Monocytes Absolute: 0.3 10*3/uL (ref 0.1–1.0)
Monocytes Relative: 4 % (ref 3–12)
NEUTROS PCT: 74 % (ref 43–77)
Neutro Abs: 4.7 10*3/uL (ref 1.7–7.7)

## 2015-02-16 LAB — COMPREHENSIVE METABOLIC PANEL
ALT: 14 U/L (ref 0–35)
ANION GAP: 8 (ref 5–15)
AST: 18 U/L (ref 0–37)
Albumin: 3.7 g/dL (ref 3.5–5.2)
Alkaline Phosphatase: 62 U/L (ref 39–117)
BUN: 29 mg/dL — ABNORMAL HIGH (ref 6–23)
CHLORIDE: 102 mmol/L (ref 96–112)
CO2: 30 mmol/L (ref 19–32)
Calcium: 9.1 mg/dL (ref 8.4–10.5)
Creatinine, Ser: 0.98 mg/dL (ref 0.50–1.10)
GFR calc Af Amer: 62 mL/min — ABNORMAL LOW (ref >90)
GFR calc non Af Amer: 54 mL/min — ABNORMAL LOW (ref >90)
GLUCOSE: 123 mg/dL — AB (ref 70–99)
Potassium: 4 mmol/L (ref 3.5–5.1)
Sodium: 140 mmol/L (ref 135–145)
TOTAL PROTEIN: 6.6 g/dL (ref 6.0–8.3)
Total Bilirubin: 0.6 mg/dL (ref 0.3–1.2)

## 2015-02-16 LAB — CBC
HEMATOCRIT: 32.6 % — AB (ref 36.0–46.0)
HEMOGLOBIN: 10.1 g/dL — AB (ref 12.0–15.0)
MCH: 28.3 pg (ref 26.0–34.0)
MCHC: 31 g/dL (ref 30.0–36.0)
MCV: 91.3 fL (ref 78.0–100.0)
Platelets: 146 10*3/uL — ABNORMAL LOW (ref 150–400)
RBC: 3.57 MIL/uL — ABNORMAL LOW (ref 3.87–5.11)
RDW: 19.2 % — AB (ref 11.5–15.5)
WBC: 6.3 10*3/uL (ref 4.0–10.5)

## 2015-02-16 MED ORDER — PACLITAXEL PROTEIN-BOUND CHEMO INJECTION 100 MG: 80.0000 mg/m2 | Freq: Once | INTRAVENOUS | Status: DC

## 2015-02-16 MED ORDER — HEPARIN SOD (PORK) LOCK FLUSH 100 UNIT/ML IV SOLN
500.0000 [IU] | Freq: Once | INTRAVENOUS | Status: AC | PRN
  Administered 2015-02-16: 500 [IU]
  Filled 2015-02-16: qty 5

## 2015-02-16 MED ORDER — PACLITAXEL PROTEIN-BOUND CHEMO INJECTION 100 MG
125.0000 mg | Freq: Once | INTRAVENOUS | Status: AC
  Administered 2015-02-16: 125 mg via INTRAVENOUS
  Filled 2015-02-16: qty 25

## 2015-02-16 MED ORDER — PALONOSETRON HCL INJECTION 0.25 MG/5ML
INTRAVENOUS | Status: AC
  Filled 2015-02-16: qty 5

## 2015-02-16 MED ORDER — DIPHENHYDRAMINE HCL 50 MG/ML IJ SOLN
INTRAMUSCULAR | Status: AC
  Filled 2015-02-16: qty 1

## 2015-02-16 MED ORDER — SODIUM CHLORIDE 0.9 % IV SOLN
Freq: Once | INTRAVENOUS | Status: AC
  Administered 2015-02-16: 12:00:00 via INTRAVENOUS
  Filled 2015-02-16: qty 5

## 2015-02-16 MED ORDER — SODIUM CHLORIDE 0.9 % IV SOLN
Freq: Once | INTRAVENOUS | Status: AC
  Administered 2015-02-16: 12:00:00 via INTRAVENOUS

## 2015-02-16 MED ORDER — SODIUM CHLORIDE 0.9 % IJ SOLN
10.0000 mL | INTRAMUSCULAR | Status: DC | PRN
  Administered 2015-02-16: 10 mL
  Filled 2015-02-16: qty 10

## 2015-02-16 MED ORDER — OXYCODONE HCL 5 MG PO TABS: ORAL_TABLET | ORAL | Status: DC

## 2015-02-16 MED ORDER — FAMOTIDINE IN NACL 20-0.9 MG/50ML-% IV SOLN
INTRAVENOUS | Status: AC
  Filled 2015-02-16: qty 50

## 2015-02-16 MED ORDER — DIPHENHYDRAMINE HCL 50 MG/ML IJ SOLN
50.0000 mg | Freq: Once | INTRAMUSCULAR | Status: AC
  Administered 2015-02-16: 50 mg via INTRAVENOUS

## 2015-02-16 MED ORDER — PALONOSETRON HCL INJECTION 0.25 MG/5ML
0.2500 mg | Freq: Once | INTRAVENOUS | Status: AC
  Administered 2015-02-16: 0.25 mg via INTRAVENOUS

## 2015-02-16 MED ORDER — FAMOTIDINE IN NACL 20-0.9 MG/50ML-% IV SOLN
20.0000 mg | Freq: Once | INTRAVENOUS | Status: AC
  Administered 2015-02-16: 20 mg via INTRAVENOUS

## 2015-02-16 MED ORDER — SODIUM CHLORIDE 0.9 % IV SOLN
145.0000 mg | Freq: Once | INTRAVENOUS | Status: AC
  Administered 2015-02-16: 150 mg via INTRAVENOUS
  Filled 2015-02-16: qty 15

## 2015-02-16 NOTE — Progress Notes (Signed)
Hemoglobin 10.1. Aranesp not given, treatment parameters not met. Tolerated chemo well.

## 2015-02-16 NOTE — Patient Instructions (Signed)
[facility] Discharge Instructions for Patients Receiving Chemotherapy  Today you received the following chemotherapy agents Abraxane and Carbo.  To help prevent nausea and vomiting after your treatment, we encourage you to take your nausea medication as instructed. If you develop nausea and vomiting that is not controlled by your nausea medication, call the clinic. If it is after clinic hours your family physician or the after hours number for the clinic or go to the Emergency Department. BELOW ARE SYMPTOMS THAT SHOULD BE REPORTED IMMEDIATELY:  *FEVER GREATER THAN 101.0 F  *CHILLS WITH OR WITHOUT FEVER  NAUSEA AND VOMITING THAT IS NOT CONTROLLED WITH YOUR NAUSEA MEDICATION  *UNUSUAL SHORTNESS OF BREATH  *UNUSUAL BRUISING OR BLEEDING  TENDERNESS IN MOUTH AND THROAT WITH OR WITHOUT PRESENCE OF ULCERS  *URINARY PROBLEMS  *BOWEL PROBLEMS  UNUSUAL RASH Items with * indicate a potential emergency and should be followed up as soon as possible.  Return as scheduled.  I have been informed and understand all the instructions given to me. I know to contact the clinic, my physician, or go to the Emergency Department if any problems should occur. I do not have any questions at this time, but understand that I may call the clinic during office hours or the Patient Navigator at [phone] should I have any questions or need assistance in obtaining follow up care.    __________________________________________  _____________  __________ Signature of Patient or Authorized Representative            Date                   Time    __________________________________________ Nurse's Signature

## 2015-02-23 ENCOUNTER — Encounter (HOSPITAL_COMMUNITY): Payer: Self-pay

## 2015-02-23 ENCOUNTER — Encounter (HOSPITAL_BASED_OUTPATIENT_CLINIC_OR_DEPARTMENT_OTHER): Payer: Medicare HMO

## 2015-02-23 DIAGNOSIS — C482 Malignant neoplasm of peritoneum, unspecified: Secondary | ICD-10-CM | POA: Diagnosis not present

## 2015-02-23 DIAGNOSIS — Z5111 Encounter for antineoplastic chemotherapy: Secondary | ICD-10-CM

## 2015-02-23 DIAGNOSIS — C569 Malignant neoplasm of unspecified ovary: Secondary | ICD-10-CM

## 2015-02-23 LAB — CBC WITH DIFFERENTIAL/PLATELET
Basophils Absolute: 0 10*3/uL (ref 0.0–0.1)
Basophils Relative: 0 % (ref 0–1)
Eosinophils Absolute: 0.1 10*3/uL (ref 0.0–0.7)
Eosinophils Relative: 1 % (ref 0–5)
HCT: 31.1 % — ABNORMAL LOW (ref 36.0–46.0)
HEMOGLOBIN: 9.8 g/dL — AB (ref 12.0–15.0)
LYMPHS ABS: 1.1 10*3/uL (ref 0.7–4.0)
Lymphocytes Relative: 15 % (ref 12–46)
MCH: 28.7 pg (ref 26.0–34.0)
MCHC: 31.5 g/dL (ref 30.0–36.0)
MCV: 91.2 fL (ref 78.0–100.0)
MONOS PCT: 4 % (ref 3–12)
Monocytes Absolute: 0.3 10*3/uL (ref 0.1–1.0)
NEUTROS ABS: 5.9 10*3/uL (ref 1.7–7.7)
NEUTROS PCT: 80 % — AB (ref 43–77)
Platelets: 144 10*3/uL — ABNORMAL LOW (ref 150–400)
RBC: 3.41 MIL/uL — AB (ref 3.87–5.11)
RDW: 19.2 % — ABNORMAL HIGH (ref 11.5–15.5)
WBC: 7.4 10*3/uL (ref 4.0–10.5)

## 2015-02-23 LAB — COMPREHENSIVE METABOLIC PANEL
ALBUMIN: 3.7 g/dL (ref 3.5–5.2)
ALK PHOS: 69 U/L (ref 39–117)
ALT: 13 U/L (ref 0–35)
AST: 19 U/L (ref 0–37)
Anion gap: 8 (ref 5–15)
BUN: 35 mg/dL — AB (ref 6–23)
CO2: 26 mmol/L (ref 19–32)
Calcium: 8.8 mg/dL (ref 8.4–10.5)
Chloride: 103 mmol/L (ref 96–112)
Creatinine, Ser: 0.86 mg/dL (ref 0.50–1.10)
GFR calc Af Amer: 73 mL/min — ABNORMAL LOW (ref >90)
GFR calc non Af Amer: 63 mL/min — ABNORMAL LOW (ref >90)
Glucose, Bld: 122 mg/dL — ABNORMAL HIGH (ref 70–99)
Potassium: 3.9 mmol/L (ref 3.5–5.1)
Sodium: 137 mmol/L (ref 135–145)
TOTAL PROTEIN: 6.8 g/dL (ref 6.0–8.3)
Total Bilirubin: 0.4 mg/dL (ref 0.3–1.2)

## 2015-02-23 MED ORDER — PALONOSETRON HCL INJECTION 0.25 MG/5ML
0.2500 mg | Freq: Once | INTRAVENOUS | Status: AC
  Administered 2015-02-23: 0.25 mg via INTRAVENOUS
  Filled 2015-02-23: qty 5

## 2015-02-23 MED ORDER — SODIUM CHLORIDE 0.9 % IV SOLN
145.0000 mg | Freq: Once | INTRAVENOUS | Status: AC
  Administered 2015-02-23: 150 mg via INTRAVENOUS
  Filled 2015-02-23: qty 15

## 2015-02-23 MED ORDER — PACLITAXEL PROTEIN-BOUND CHEMO INJECTION 100 MG: 80.0000 mg/m2 | Freq: Once | INTRAVENOUS | Status: DC

## 2015-02-23 MED ORDER — PACLITAXEL PROTEIN-BOUND CHEMO INJECTION 100 MG
125.0000 mg | Freq: Once | INTRAVENOUS | Status: AC
  Administered 2015-02-23: 125 mg via INTRAVENOUS
  Filled 2015-02-23: qty 25

## 2015-02-23 MED ORDER — SODIUM CHLORIDE 0.9 % IJ SOLN
10.0000 mL | INTRAMUSCULAR | Status: DC | PRN
  Administered 2015-02-23: 10 mL
  Filled 2015-02-23: qty 10

## 2015-02-23 MED ORDER — FAMOTIDINE IN NACL 20-0.9 MG/50ML-% IV SOLN
20.0000 mg | Freq: Once | INTRAVENOUS | Status: AC
  Administered 2015-02-23: 20 mg via INTRAVENOUS

## 2015-02-23 MED ORDER — FAMOTIDINE IN NACL 20-0.9 MG/50ML-% IV SOLN
INTRAVENOUS | Status: AC
  Filled 2015-02-23: qty 50

## 2015-02-23 MED ORDER — SODIUM CHLORIDE 0.9 % IV SOLN
Freq: Once | INTRAVENOUS | Status: AC
  Administered 2015-02-23: 10:00:00 via INTRAVENOUS

## 2015-02-23 MED ORDER — HEPARIN SOD (PORK) LOCK FLUSH 100 UNIT/ML IV SOLN
500.0000 [IU] | Freq: Once | INTRAVENOUS | Status: DC | PRN
Start: 2015-02-23 — End: 2015-02-23
  Filled 2015-02-23: qty 5

## 2015-02-23 MED ORDER — DIPHENHYDRAMINE HCL 50 MG/ML IJ SOLN
50.0000 mg | Freq: Once | INTRAMUSCULAR | Status: AC
  Administered 2015-02-23: 50 mg via INTRAVENOUS

## 2015-02-23 MED ORDER — SODIUM CHLORIDE 0.9 % IV SOLN
Freq: Once | INTRAVENOUS | Status: AC
  Administered 2015-02-23: 12:00:00 via INTRAVENOUS
  Filled 2015-02-23: qty 5

## 2015-02-23 MED ORDER — OXYCODONE HCL 5 MG PO TABS
10.0000 mg | ORAL_TABLET | Freq: Once | ORAL | Status: AC
  Administered 2015-02-23: 10 mg via ORAL
  Filled 2015-02-23: qty 2

## 2015-02-23 MED ORDER — DIPHENHYDRAMINE HCL 50 MG/ML IJ SOLN
INTRAMUSCULAR | Status: AC
  Filled 2015-02-23: qty 1

## 2015-02-23 NOTE — Progress Notes (Signed)
Tolerated chemo well. 

## 2015-02-23 NOTE — Patient Instructions (Signed)
..  [place] at [facility] Discharge Instructions  RECOMMENDATIONS MADE BY THE CONSULTANT AND ANY TEST RESULTS WILL BE SENT TO YOUR REFERRING PHYSICIAN. You have finished 6 cycles chemo Return on 4/7 as scheduled to see the Dr.  Lujean Rave you for choosing [place] at [facility] to provide your oncology and hematology care.  To afford each patient quality time with our provider, please arrive at least 15 minutes before your scheduled appointment time.    You need to re-schedule your appointment should you arrive 10 or more minutes late.  We strive to give you quality time with our providers, and arriving late affects you and other patients whose appointments are after yours.  Also, if you no show three or more times for appointments you may be dismissed from the clinic at the providers discretion.     Again, thank you for choosing [facility].  Our hope is that these requests will decrease the amount of time that you wait before being seen by our physicians.       _____________________________________________________________  Should you have questions after your visit to [facility], please contact our office at [phone] between the hours of 8:30 a.m. and 4:30 p.m.  Voicemails left after 4:30 p.m. will not be returned until the following business day.  For prescription refill requests, have your pharmacy contact our office.

## 2015-03-02 ENCOUNTER — Encounter (HOSPITAL_BASED_OUTPATIENT_CLINIC_OR_DEPARTMENT_OTHER): Payer: Medicare HMO

## 2015-03-02 ENCOUNTER — Encounter (HOSPITAL_COMMUNITY): Payer: Self-pay

## 2015-03-02 ENCOUNTER — Encounter (HOSPITAL_COMMUNITY): Payer: Medicare HMO

## 2015-03-02 DIAGNOSIS — D6481 Anemia due to antineoplastic chemotherapy: Secondary | ICD-10-CM | POA: Diagnosis not present

## 2015-03-02 DIAGNOSIS — T451X5A Adverse effect of antineoplastic and immunosuppressive drugs, initial encounter: Principal | ICD-10-CM

## 2015-03-02 DIAGNOSIS — C482 Malignant neoplasm of peritoneum, unspecified: Secondary | ICD-10-CM | POA: Diagnosis not present

## 2015-03-02 LAB — CBC
HCT: 31.7 % — ABNORMAL LOW (ref 36.0–46.0)
Hemoglobin: 9.9 g/dL — ABNORMAL LOW (ref 12.0–15.0)
MCH: 29.1 pg (ref 26.0–34.0)
MCHC: 31.2 g/dL (ref 30.0–36.0)
MCV: 93.2 fL (ref 78.0–100.0)
Platelets: 168 10*3/uL (ref 150–400)
RBC: 3.4 MIL/uL — ABNORMAL LOW (ref 3.87–5.11)
RDW: 19.4 % — ABNORMAL HIGH (ref 11.5–15.5)
WBC: 4 10*3/uL (ref 4.0–10.5)

## 2015-03-02 MED ORDER — DARBEPOETIN ALFA 150 MCG/0.3ML IJ SOSY
150.0000 ug | PREFILLED_SYRINGE | Freq: Once | INTRAMUSCULAR | Status: AC
  Administered 2015-03-02: 150 ug via SUBCUTANEOUS

## 2015-03-02 MED ORDER — DARBEPOETIN ALFA 200 MCG/0.4ML IJ SOSY
PREFILLED_SYRINGE | INTRAMUSCULAR | Status: AC
  Filled 2015-03-02: qty 0.4

## 2015-03-02 NOTE — Progress Notes (Signed)
..  [name] presents today for injection per the provider's orders.  aranesp administration without incident; see MAR for injection details.  Patient tolerated procedure well and without incident.  No questions or complaints noted at this time.

## 2015-03-09 ENCOUNTER — Encounter (HOSPITAL_COMMUNITY): Payer: Medicare HMO | Attending: Hematology | Admitting: Hematology & Oncology

## 2015-03-09 ENCOUNTER — Encounter (HOSPITAL_COMMUNITY): Payer: Self-pay | Admitting: Hematology & Oncology

## 2015-03-09 ENCOUNTER — Encounter (HOSPITAL_BASED_OUTPATIENT_CLINIC_OR_DEPARTMENT_OTHER): Payer: Medicare HMO

## 2015-03-09 VITALS — BP 110/61 | HR 89 | Temp 97.9°F | Resp 20 | Wt 143.9 lb

## 2015-03-09 DIAGNOSIS — N189 Chronic kidney disease, unspecified: Secondary | ICD-10-CM | POA: Diagnosis present

## 2015-03-09 DIAGNOSIS — R5383 Other fatigue: Secondary | ICD-10-CM | POA: Diagnosis not present

## 2015-03-09 DIAGNOSIS — J910 Malignant pleural effusion: Secondary | ICD-10-CM | POA: Diagnosis not present

## 2015-03-09 DIAGNOSIS — C569 Malignant neoplasm of unspecified ovary: Secondary | ICD-10-CM | POA: Diagnosis present

## 2015-03-09 DIAGNOSIS — C482 Malignant neoplasm of peritoneum, unspecified: Secondary | ICD-10-CM | POA: Diagnosis not present

## 2015-03-09 DIAGNOSIS — D6481 Anemia due to antineoplastic chemotherapy: Secondary | ICD-10-CM

## 2015-03-09 DIAGNOSIS — D631 Anemia in chronic kidney disease: Secondary | ICD-10-CM | POA: Insufficient documentation

## 2015-03-09 DIAGNOSIS — M545 Low back pain: Secondary | ICD-10-CM

## 2015-03-09 DIAGNOSIS — T451X5A Adverse effect of antineoplastic and immunosuppressive drugs, initial encounter: Principal | ICD-10-CM

## 2015-03-09 LAB — CBC
HCT: 32.9 % — ABNORMAL LOW (ref 36.0–46.0)
Hemoglobin: 10.2 g/dL — ABNORMAL LOW (ref 12.0–15.0)
MCH: 28.6 pg (ref 26.0–34.0)
MCHC: 31 g/dL (ref 30.0–36.0)
MCV: 92.2 fL (ref 78.0–100.0)
Platelets: 147 10*3/uL — ABNORMAL LOW (ref 150–400)
RBC: 3.57 MIL/uL — ABNORMAL LOW (ref 3.87–5.11)
RDW: 18.4 % — AB (ref 11.5–15.5)
WBC: 4.3 10*3/uL (ref 4.0–10.5)

## 2015-03-09 NOTE — Progress Notes (Signed)
LABS DRAWN

## 2015-03-09 NOTE — Patient Instructions (Signed)
[place] at [facility]  Discharge Instructions:  Your exam was completed by Dr Barbee Shropshire today. Return in 1 month to see the doctor and to have your port flushed. Please call the clinic if you have any questions or concerns. _______________________________________________________________  Thank you for choosing [place] at [facility] to provide your oncology and hematology care.  To afford each patient quality time with our providers, please arrive at least 15 minutes before your scheduled appointment.  You need to re-schedule your appointment if you arrive 10 or more minutes late.  We strive to give you quality time with our providers, and arriving late affects you and other patients whose appointments are after yours.  Also, if you no show three or more times for appointments you may be dismissed from the clinic.  Again, thank you for choosing [place] at [place] hope is that these requests will allow you access to exceptional care and in a timely manner. _______________________________________________________________  If you have questions after your visit, please contact our office at [phone] between the hours of 8:30 a.m. and 5:00 p.m. Voicemails left after 4:30 p.m. will not be returned until the following business day. _______________________________________________________________  For prescription refill requests, have your pharmacy contact our office. _______________________________________________________________  Recommendations made by the consultant and any test results will be sent to your referring physician. _______________________________________________________________

## 2015-03-09 NOTE — Progress Notes (Signed)
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 BRCA2 is positive on tumor cells present in the pleural fluid making the patient a candidate for Olaparib as second line treatment should current regimen become ineffective or too toxic. Began Carboplatin/Paclitaxel on 09/22/2014. On day 8 cycle 3 Abraxane substituted for Taxol.  INTERVAL HISTORY: [name] 80 y.o. female returns for follow-up of her primary peritoneal carcinoma.she continues to make slow improvements. Her appetite is excellent. She continues to have low left flank pain. She states this has been present since her admission to the [facility]. She needs one or 2 pain pills a day.   Her breathing is good. She sleeps fairly well. She reports some mild neuropathy of the fingers and toes from her chemotherapy, this does not limit her ADLs.  MEDICAL HISTORY: Past Medical History  Diagnosis Date  . GERD (gastroesophageal reflux disease)   . Osteoarthritis   . Peripheral edema   . CHF (congestive heart failure), NYHA class IV     diastolic  . Cancer     has GERD; CONSTIPATION, CHRONIC; HEMATOCHEZIA; OSTEOARTHRITIS, LOWER LEG; KNEE PAIN; BURSITIS, KNEE; Dysphagia; Malignant pleural effusion; Edema of both legs; Heart failure, diastolic, with acute decompensation; Hyperkalemia; Anemia, unspecified; Peritoneal carcinoma; Rectal bleed; Chest pain; Protein-calorie malnutrition, severe; Dysphagia, pharyngoesophageal phase; Constipation; Acute on chronic congestive heart failure; Acute on chronic respiratory failure with hypoxia; Ovarian cancer; Anemia due to chemotherapy; and Cellulitis on her problem list.       Ovarian cancer   07/25/2014 Pathology Results Left pleural effusion positive for malignant cell consistent with adenocarcinoma.   07/27/2014 - 08/05/2014 Hospital Admission Heart failure with malignant pleural effusion   07/28/2014 Pathology Results Left pleural effusion positive for malignant cells consistent with adenocarcinoma (CK7 pos, CK20 neg, WT-1 pos, ER weakly pos, PR neg, TTF-1 neg, GCDFP-15 neg).  Findings favor gyn/ovarian primary.   08/01/2014 Pathology Results [facility]- Postive genomic findings: BRCA2 R2388f*31, STK11 loss, MYC amplification-equivocal, TP53 R273C, MLL2 QH8850.  Targeted treatments include Olaparib (BRCA2) and Everolimus/Temsirolimus (STK11).   08/03/2014 Tumor Marker CA 125 4954   08/04/2014 Procedure Left pleurX catheter by Dr. VLucianne LeiTright   09/08/2014 - 09/14/2014 Hospital Admission Hospitalized with the following main issues: acute on chronic CHF, acute on chronic respiratory failure with hypoxia, and malignant plueral effusion on right    09/13/2014 Procedure Right PleurX catheter by Dr. BCyndia Bent  09/13/2014 Pathology Results Right pleural effusion positive for malignant adenocarcinoma   09/21/2014 Tumor Marker CA 125 3869   09/22/2014 - 11/17/2014 Chemotherapy Carboplatin/Paclitaxel days 1, 8, 15 every 28 days.   10/20/2014 Tumor Marker CA 125 335   11/23/2014 -  Chemotherapy Carboplatin/Abraxane day 1, 8, 15 every 28 days.     is allergic to penicillins.  Ms. BDercolehad no medications administered during this visit.  SURGICAL HISTORY: Past Surgical History  Procedure Laterality Date  . Cholecystectomy    . Cardiac catheterization    . Total abdominal hysterectomy  40 years ago    patient does not know if ovaries were removed  . Appendectomy    . Total knee arthroplasty Left   . Chest tube insertion Left 08/04/2014    Procedure: INSERTION PLEURAL DRAINAGE CATHETER;  Surgeon: PCollier Salina  Prescott Gum, MD;  Location: [place];  Service: Thoracic;  Laterality: Left;    . Chest tube insertion Right 09/13/2014    Procedure: INSERTION PLEURAL DRAINAGE CATHETER;  Surgeon: Gaye Pollack, MD;  Location: [place];  Service: Thoracic;  Laterality: Right;  . Removal of pleural drainage catheter Bilateral 12/06/2014    Procedure: REMOVAL OF PLEURAL DRAINAGE CATHETER;  Surgeon: Ivin Poot, MD;  Location: [place];  Service: Thoracic;  Laterality: Bilateral;    SOCIAL HISTORY: History   Social History  . Marital Status: Widowed    Spouse Name: N/A  . Number of Children: N/A  . Years of Education: N/A   Occupational History  . nurses aid    Social History Main Topics  . Smoking status: Never Smoker   . Smokeless tobacco: Not on file  . Alcohol Use: No  . Drug Use: No  . Sexual Activity: Not on file   Other Topics Concern  . Not on file   Social History Narrative    FAMILY HISTORY: Noncontributory   Review of Systems  Constitutional: Positive for malaise/fatigue. Negative for fever, chills and weight loss.  HENT: Negative for congestion, hearing loss, nosebleeds, sore throat and tinnitus.   Eyes: Negative for blurred vision, double vision, pain and discharge.  Respiratory: Negative for cough, hemoptysis, sputum production, shortness of breath and wheezing.   Cardiovascular: Negative for chest pain, palpitations, claudication, leg swelling and PND.  Gastrointestinal: Negative for heartburn, nausea, vomiting, abdominal pain, diarrhea, constipation, blood in stool and melena.  Genitourinary: Negative for dysuria, urgency, frequency and hematuria.  Musculoskeletal: Positive for back pain. Negative for myalgias, joint pain and falls.  Skin: Negative for itching and rash.  Neurological: Positive for sensory change. Negative for dizziness, tingling, tremors, speech change, focal weakness, seizures, loss of consciousness, weakness and headaches.  Endo/Heme/Allergies: Does not bruise/bleed easily.  Psychiatric/Behavioral: Negative for depression, suicidal  ideas, memory loss and substance abuse. The patient is not nervous/anxious and does not have insomnia.     PHYSICAL EXAMINATION  ECOG PERFORMANCE STATUS: 1 - Symptomatic but completely ambulatory  Filed Vitals:   03/09/15 0900  BP: 110/61  Pulse: 89  Temp: 97.9 F (36.6 C)  Resp: 20    Physical Exam  Constitutional: She is oriented to person, place, and time and well-developed, well-nourished, and in no distress.  HENT:  Head: Normocephalic and atraumatic.  Mouth/Throat: No oropharyngeal exudate.  Eyes: Conjunctivae and EOM are normal. Pupils are equal, round, and reactive to light. No scleral icterus.  Neck: Normal range of motion. Neck supple. No JVD present. No thyromegaly present.  Cardiovascular: Normal rate and regular rhythm.  Exam reveals no friction rub.   No murmur heard. Pulmonary/Chest: Effort normal and breath sounds normal. No respiratory distress. She has no wheezes. She has no rales.  Abdominal: Soft. Bowel sounds are normal. She exhibits no distension. There is tenderness. There is no rebound and no guarding.  Tenderness mild  Musculoskeletal: Normal range of motion.  Lymphadenopathy:    She has no cervical adenopathy.  Neurological: She is alert and oriented to person, place, and time. No cranial nerve deficit. Coordination normal.  Skin: Skin is warm and dry.  Psychiatric: Mood, memory, affect and judgment normal.    LABORATORY DATA:  CBC    Component Value Date/Time   WBC 4.3 03/09/2015 0912   RBC 3.57* 03/09/2015 0912   RBC 4.29 07/27/2014 2257   HGB 10.2* 03/09/2015 0912   HCT 32.9* 03/09/2015 0912  PLT 147* 03/09/2015 0912   MCV 92.2 03/09/2015 0912   MCH 28.6 03/09/2015 0912   MCHC 31.0 03/09/2015 0912   RDW 18.4* 03/09/2015 0912   LYMPHSABS 1.1 02/23/2015 1033   MONOABS 0.3 02/23/2015 1033   EOSABS 0.1 02/23/2015 1033   BASOSABS 0.0 02/23/2015 1033   CMP     Component Value Date/Time   NA 137 02/23/2015 1033   K 3.9 02/23/2015 1033    CL 103 02/23/2015 1033   CO2 26 02/23/2015 1033   GLUCOSE 122* 02/23/2015 1033   BUN 35* 02/23/2015 1033   CREATININE 0.86 02/23/2015 1033   CALCIUM 8.8 02/23/2015 1033   PROT 6.8 02/23/2015 1033   ALBUMIN 3.7 02/23/2015 1033   AST 19 02/23/2015 1033   ALT 13 02/23/2015 1033   ALKPHOS 69 02/23/2015 1033   BILITOT 0.4 02/23/2015 1033   GFRNONAA 63* 02/23/2015 1033   GFRAA 73* 02/23/2015 1033    ASSESSMENT and THERAPY PLAN:   Stage IV primary peritoneal carcinoma BRCA 2 positivity  We will continue with observation. Her last CA 125 was normal. Clinically she is doing well. She has not had a screening mammogram per the records since late 2014, at her next visit we will arrange for screening mammography. I would like to see her back in 4 weeks' time. We will recheck a CA 125 at that visit and if it remains normal began to space out her visits.  I have advised her that she has no limitations on her physical activity. I want her to try to be as active as possible. Her weight is up and she continues to make nice improvement. Since January she has gained 17 pounds. I discussed the plan with the patient in detail and she agrees.  All questions were answered. The patient knows to call the clinic with any problems, questions or concerns. We can certainly see the patient much sooner if necessary. This note was signed electronically Penland,Shannon KristenMD 03/09/2015

## 2015-03-28 ENCOUNTER — Telehealth (HOSPITAL_COMMUNITY): Payer: Self-pay

## 2015-03-28 NOTE — Telephone Encounter (Signed)
Call from daughter, Rolene Arbour.  States "My aunt has been told by her PCP that because of my mom she needs to have genetic testing done but her insurance will not cover it.  She wants to find out if this could be done on my mother instead and would it be beneficial?"

## 2015-04-06 ENCOUNTER — Encounter (HOSPITAL_COMMUNITY): Payer: Self-pay | Admitting: Hematology & Oncology

## 2015-04-06 ENCOUNTER — Ambulatory Visit (HOSPITAL_COMMUNITY)
Admission: RE | Admit: 2015-04-06 | Discharge: 2015-04-06 | Disposition: A | Discharge status: Home / Self Care | Payer: Medicare HMO | Source: Ambulatory Visit | Attending: Hematology & Oncology | Admitting: Hematology & Oncology

## 2015-04-06 ENCOUNTER — Encounter (HOSPITAL_BASED_OUTPATIENT_CLINIC_OR_DEPARTMENT_OTHER): Payer: Medicare HMO | Admitting: Hematology & Oncology

## 2015-04-06 ENCOUNTER — Encounter (HOSPITAL_COMMUNITY): Payer: Medicare HMO | Attending: Hematology

## 2015-04-06 VITALS — BP 132/77 | HR 89 | Temp 97.8°F | Resp 16 | Wt 149.1 lb

## 2015-04-06 DIAGNOSIS — N189 Chronic kidney disease, unspecified: Secondary | ICD-10-CM | POA: Diagnosis present

## 2015-04-06 DIAGNOSIS — C482 Malignant neoplasm of peritoneum, unspecified: Secondary | ICD-10-CM

## 2015-04-06 DIAGNOSIS — C569 Malignant neoplasm of unspecified ovary: Secondary | ICD-10-CM | POA: Diagnosis present

## 2015-04-06 DIAGNOSIS — Z95828 Presence of other vascular implants and grafts: Secondary | ICD-10-CM

## 2015-04-06 DIAGNOSIS — D631 Anemia in chronic kidney disease: Secondary | ICD-10-CM | POA: Diagnosis present

## 2015-04-06 DIAGNOSIS — R0781 Pleurodynia: Secondary | ICD-10-CM | POA: Insufficient documentation

## 2015-04-06 LAB — COMPREHENSIVE METABOLIC PANEL
ALBUMIN: 3.9 g/dL (ref 3.5–5.0)
ALK PHOS: 64 U/L (ref 38–126)
ALT: 12 U/L — AB (ref 14–54)
AST: 19 U/L (ref 15–41)
Anion gap: 10 (ref 5–15)
BILIRUBIN TOTAL: 0.5 mg/dL (ref 0.3–1.2)
BUN: 35 mg/dL — ABNORMAL HIGH (ref 6–20)
CO2: 27 mmol/L (ref 22–32)
CREATININE: 0.98 mg/dL (ref 0.44–1.00)
Calcium: 8.8 mg/dL — ABNORMAL LOW (ref 8.9–10.3)
Chloride: 102 mmol/L (ref 101–111)
GFR calc Af Amer: >60 mL/min (ref >60)
GFR calc non Af Amer: 54 mL/min — ABNORMAL LOW (ref >60)
Glucose, Bld: 77 mg/dL (ref 70–99)
Potassium: 3.4 mmol/L — ABNORMAL LOW (ref 3.5–5.1)
Sodium: 139 mmol/L (ref 135–145)
Total Protein: 7.1 g/dL (ref 6.5–8.1)

## 2015-04-06 LAB — CBC WITH DIFFERENTIAL/PLATELET
BASOS ABS: 0 10*3/uL (ref 0.0–0.1)
Basophils Relative: 0 % (ref 0–1)
Eosinophils Absolute: 0.2 10*3/uL (ref 0.0–0.7)
Eosinophils Relative: 2 % (ref 0–5)
HCT: 33.9 % — ABNORMAL LOW (ref 36.0–46.0)
Hemoglobin: 10.5 g/dL — ABNORMAL LOW (ref 12.0–15.0)
Lymphocytes Relative: 26 % (ref 12–46)
Lymphs Abs: 2.1 10*3/uL (ref 0.7–4.0)
MCH: 27.1 pg (ref 26.0–34.0)
MCHC: 31 g/dL (ref 30.0–36.0)
MCV: 87.4 fL (ref 78.0–100.0)
Monocytes Absolute: 0.8 10*3/uL (ref 0.1–1.0)
Monocytes Relative: 10 % (ref 3–12)
NEUTROS ABS: 5.1 10*3/uL (ref 1.7–7.7)
NEUTROS PCT: 62 % (ref 43–77)
Platelets: 226 10*3/uL (ref 150–400)
RBC: 3.88 MIL/uL (ref 3.87–5.11)
RDW: 17.3 % — AB (ref 11.5–15.5)
WBC: 8.2 10*3/uL (ref 4.0–10.5)

## 2015-04-06 MED ORDER — HEPARIN SOD (PORK) LOCK FLUSH 100 UNIT/ML IV SOLN
500.0000 [IU] | Freq: Once | INTRAVENOUS | Status: AC
  Administered 2015-04-06: 500 [IU] via INTRAVENOUS

## 2015-04-06 MED ORDER — SODIUM CHLORIDE 0.9 % IJ SOLN
10.0000 mL | INTRAMUSCULAR | Status: DC | PRN
  Administered 2015-04-06: 10 mL via INTRAVENOUS
  Filled 2015-04-06: qty 10

## 2015-04-06 NOTE — Progress Notes (Addendum)
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 BRCA2 is positive on tumor cells present in the pleural fluid making the patient a candidate for Olaparib as second line treatment should current regimen become ineffective or too toxic. Began Carboplatin/Paclitaxel on 09/22/2014. On day 8 cycle 3 Abraxane substituted for Taxol.   INTERVAL HISTORY: [name] 80 y.o. female returns for follow-up of her primary peritoneal carcinoma.Her appetite is excellent. She continues to have low left flank pain/rib pain. It hurts when she takes deep breaths or rubs on the area. It also hurts when she lies on her left side. She states this has been present since her admission to the [facility]. She needs one or 2 pain pills a day.   Her breathing is good. She sleeps fairly well. She reports some mild neuropathy of the fingers and toes from her chemotherapy, this does not limit her ADLs.   Youngest son helps pt out.  She has gained weight. Denies dizziness, swelling, or chest pain. Reports regular bowel movements, no problems with the pain medications. Denies any falls.  Has previously had an Appendectomy and Hysterectomy.   MEDICAL HISTORY: Past Medical History  Diagnosis Date  . GERD (gastroesophageal reflux disease)   . Osteoarthritis   . Peripheral edema   . CHF (congestive heart failure), NYHA class IV     diastolic  . Cancer     has GERD; CONSTIPATION, CHRONIC; HEMATOCHEZIA; OSTEOARTHRITIS, LOWER LEG; KNEE PAIN; BURSITIS, KNEE; Dysphagia; Malignant pleural effusion; Edema of both legs; Heart failure, diastolic, with acute decompensation; Hyperkalemia; Anemia,  unspecified; Peritoneal carcinoma; Rectal bleed; Chest pain; Protein-calorie malnutrition, severe; Dysphagia, pharyngoesophageal phase; Constipation; Acute on chronic congestive heart failure; Acute on chronic respiratory failure with hypoxia; Ovarian cancer; Anemia due to chemotherapy; and Cellulitis on her problem list.      Ovarian cancer   07/25/2014 Pathology Results Left pleural effusion positive for malignant cell consistent with adenocarcinoma.   07/27/2014 - 08/05/2014 Hospital Admission Heart failure with malignant pleural effusion   07/28/2014 Pathology Results Left pleural effusion positive for malignant cells consistent with adenocarcinoma (CK7 pos, CK20 neg, WT-1 pos, ER weakly pos, PR neg, TTF-1 neg, GCDFP-15 neg).  Findings favor gyn/ovarian primary.   08/01/2014 Pathology Results [facility]- Postive genomic findings: BRCA2 R2378fs*31, STK11 loss, MYC amplification-equivocal, TP53 R273C, MLL2 U0454*.  Targeted treatments include Olaparib (BRCA2) and Everolimus/Temsirolimus (STK11).   08/03/2014 Tumor Marker CA 125 4954   08/04/2014 Procedure Left pleurX catheter by Dr. Lucianne Lei Tright   09/08/2014 - 09/14/2014 Hospital Admission Hospitalized with the following main issues: acute on chronic CHF, acute on chronic respiratory failure with hypoxia, and malignant plueral effusion on right    09/13/2014 Procedure Right PleurX catheter by Dr. Cyndia Bent   09/13/2014 Pathology Results Right pleural effusion positive for malignant adenocarcinoma   09/21/2014 Tumor Marker CA 125 3869   09/22/2014 - 11/17/2014 Chemotherapy Carboplatin/Paclitaxel days 1, 8, 15 every 28 days.   10/20/2014 Tumor Marker CA 125 335   11/23/2014 -  Chemotherapy Carboplatin/Abraxane day 1, 8, 15 every 28 days.     is allergic to penicillins.  We administered heparin lock flush and sodium chloride.  SURGICAL HISTORY: Past Surgical History  Procedure Laterality Date  . Cholecystectomy    .  Cardiac catheterization    . Total  abdominal hysterectomy  40 years ago    patient does not know if ovaries were removed  . Appendectomy    . Total knee arthroplasty Left   . Chest tube insertion Left 08/04/2014    Procedure: INSERTION PLEURAL DRAINAGE CATHETER;  Surgeon: Ivin Poot, MD;  Location: [place];  Service: Thoracic;  Laterality: Left;  . Chest tube insertion Right 09/13/2014    Procedure: INSERTION PLEURAL DRAINAGE CATHETER;  Surgeon: Gaye Pollack, MD;  Location: [place];  Service: Thoracic;  Laterality: Right;  . Removal of pleural drainage catheter Bilateral 12/06/2014    Procedure: REMOVAL OF PLEURAL DRAINAGE CATHETER;  Surgeon: Ivin Poot, MD;  Location: [place];  Service: Thoracic;  Laterality: Bilateral;    SOCIAL HISTORY: History   Social History  . Marital Status: Widowed    Spouse Name: N/A  . Number of Children: N/A  . Years of Education: N/A   Occupational History  . nurses aid    Social History Main Topics  . Smoking status: Never Smoker   . Smokeless tobacco: Not on file  . Alcohol Use: No  . Drug Use: No  . Sexual Activity: Not on file   Other Topics Concern  . Not on file   Social History Narrative    FAMILY HISTORY: Noncontributory   Review of Systems  Constitutional: Negative for fever, chills, fatigue, and weight loss.  HENT: Negative for congestion, hearing loss, nosebleeds, sore throat and tinnitus.   Eyes: Negative for blurred vision, double vision, pain and discharge.  Respiratory: Negative for cough, hemoptysis, sputum production, shortness of breath and wheezing.   Cardiovascular: Negative for chest pain, palpitations, claudication, leg swelling and PND.  Gastrointestinal: Negative for heartburn, nausea, vomiting, abdominal pain, diarrhea, constipation, blood in stool and melena.  Genitourinary: Negative for dysuria, urgency, frequency and hematuria.  Musculoskeletal: Positive for back pain. Negative for myalgias, joint pain and falls.  Skin: Negative for itching  and rash.  Neurological: Positive for sensory change. Negative for dizziness, tingling, tremors, speech change, focal weakness, seizures, loss of consciousness, weakness and headaches.  Endo/Heme/Allergies: Does not bruise/bleed easily.  Psychiatric/Behavioral: Negative for depression, suicidal ideas, memory loss and substance abuse. The patient is not nervous/anxious and does not have insomnia.     PHYSICAL EXAMINATION  ECOG PERFORMANCE STATUS: 1 - Symptomatic but completely ambulatory  Filed Vitals:   04/06/15 1315  BP: 132/77  Pulse: 89  Temp: 97.8 F (36.6 C)  Resp: 16    Physical Exam  Constitutional: She is oriented to person, place, and time and well-developed, well-nourished, and in no distress.  HENT:  Head: Normocephalic and atraumatic.  Mouth/Throat: No oropharyngeal exudate.  Eyes: Conjunctivae and EOM are normal. Pupils are equal, round, and reactive to light. No scleral icterus.  Neck: Normal range of motion. Neck supple. No JVD present. No thyromegaly present.  Cardiovascular: Normal rate and regular rhythm.  Exam reveals no friction rub.   No murmur heard. Pulmonary/Chest: Effort normal and breath sounds normal. No respiratory distress. She has no wheezes. She has no rales.  Abdominal: Soft. Bowel sounds are normal. She exhibits no distension. There is tenderness. There is no rebound and no guarding.  Tenderness mild, in the left lower back and side. pain with palpation over the lateral and posterior lower left ribs, no palpable mass Musculoskeletal: Normal range of motion.  Lymphadenopathy:    She has no cervical adenopathy.  Neurological: She is alert and  oriented to person, place, and time. No cranial nerve deficit. Coordination normal.  Skin: Skin is warm and dry.  Psychiatric: Mood, memory, affect and judgment normal.    LABORATORY DATA:  CBC    Component Value Date/Time   WBC 8.2 04/06/2015 1330   RBC 3.88 04/06/2015 1330   RBC 4.29 07/27/2014 2257     HGB 10.5* 04/06/2015 1330   HCT 33.9* 04/06/2015 1330   PLT 226 04/06/2015 1330   MCV 87.4 04/06/2015 1330   MCH 27.1 04/06/2015 1330   MCHC 31.0 04/06/2015 1330   RDW 17.3* 04/06/2015 1330   LYMPHSABS 2.1 04/06/2015 1330   MONOABS 0.8 04/06/2015 1330   EOSABS 0.2 04/06/2015 1330   BASOSABS 0.0 04/06/2015 1330   CMP     Component Value Date/Time   NA 137 02/23/2015 1033   K 3.9 02/23/2015 1033   CL 103 02/23/2015 1033   CO2 26 02/23/2015 1033   GLUCOSE 122* 02/23/2015 1033   BUN 35* 02/23/2015 1033   CREATININE 0.86 02/23/2015 1033   CALCIUM 8.8 02/23/2015 1033   PROT 6.8 02/23/2015 1033   ALBUMIN 3.7 02/23/2015 1033   AST 19 02/23/2015 1033   ALT 13 02/23/2015 1033   ALKPHOS 69 02/23/2015 1033   BILITOT 0.4 02/23/2015 1033   GFRNONAA 63* 02/23/2015 1033   GFRAA 73* 02/23/2015 1033    ASSESSMENT and THERAPY PLAN:   Stage IV primary peritoneal carcinoma BRCA 2 positivity  We will continue with observation. Her last CA 125 was normal. Clinically she is doing well. She has not had a screening mammogram per the records since late 2014, at her next visit we will arrange for screening mammography. I would like to see her back in 8 weeks' time. We will recheck a CA 125 at that visit and if it remains normal began to space out her visits.  I have advised her that she has no limitations on her physical activity. I want her to try to be as active as possible. Her weight is up and she continues to make nice improvement. She is ok to drive locally.  Given the persistent nature of her rib pain we will check a rib series today.  I will call her with the results. If it is WNL we can arrange for CT of the chest. I strongly feel that it is musculoskeletal but it is very concerning to the patient.   Follow up in 2 months with repeat labs.  All questions were answered. The patient knows to call the clinic with any problems, questions or concerns. We can certainly see the patient much  sooner if necessary. This note was signed electronically  This document serves as a record of services personally performed by Ancil Linsey, MD. It was created on her behalf by Arlyce Harman, a trained medical scribe. The creation of this record is based on the scribe's personal observations and the provider's statements to them. This document has been checked and approved by the attending provider. I have reviewed the above documentation for accuracy and completeness and I agree with the above.   Ancil Linsey, MD

## 2015-04-06 NOTE — Patient Instructions (Signed)
..  [place] at [facility] Discharge Instructions  RECOMMENDATIONS MADE BY THE CONSULTANT AND ANY TEST RESULTS WILL BE SENT TO YOUR REFERRING PHYSICIAN.  X-rays today  Return for exam  in 2 months  Port flush in 6 weeks Thank you for choosing [place] at [facility] to provide your oncology and hematology care.  To afford each patient quality time with our provider, please arrive at least 15 minutes before your scheduled appointment time.    You need to re-schedule your appointment should you arrive 10 or more minutes late.  We strive to give you quality time with our providers, and arriving late affects you and other patients whose appointments are after yours.  Also, if you no show three or more times for appointments you may be dismissed from the clinic at the providers discretion.     Again, thank you for choosing [facility].  Our hope is that these requests will decrease the amount of time that you wait before being seen by our physicians.       _____________________________________________________________  Should you have questions after your visit to [facility], please contact our office at [phone] between the hours of 8:30 a.m. and 4:30 p.m.  Voicemails left after 4:30 p.m. will not be returned until the following business day.  For prescription refill requests, have your pharmacy contact our office.

## 2015-04-06 NOTE — Progress Notes (Signed)
Please see doctors encounter for more information 

## 2015-04-06 NOTE — Progress Notes (Signed)
[name] presented for Portacath access and flush.  Proper placement of portacath confirmed by CXR.  Portacath located right chest wall accessed with  H 20 needle.  Good blood return present. Portacath flushed with 55ml NS and 500U/85ml Heparin and needle removed intact.  Procedure tolerated well and without incident.

## 2015-04-07 LAB — CA 125: CA 125: 23.9 U/mL (ref 0.0–34.0)

## 2015-05-18 ENCOUNTER — Encounter (HOSPITAL_COMMUNITY): Payer: Medicare HMO | Attending: Hematology & Oncology

## 2015-05-18 ENCOUNTER — Encounter (HOSPITAL_COMMUNITY): Payer: Self-pay

## 2015-05-18 DIAGNOSIS — C482 Malignant neoplasm of peritoneum, unspecified: Secondary | ICD-10-CM | POA: Diagnosis not present

## 2015-05-18 DIAGNOSIS — Z452 Encounter for adjustment and management of vascular access device: Secondary | ICD-10-CM | POA: Diagnosis not present

## 2015-05-18 MED ORDER — HEPARIN SOD (PORK) LOCK FLUSH 100 UNIT/ML IV SOLN
500.0000 [IU] | Freq: Once | INTRAVENOUS | Status: AC
  Administered 2015-05-18: 500 [IU] via INTRAVENOUS
  Filled 2015-05-18: qty 5

## 2015-05-18 MED ORDER — SODIUM CHLORIDE 0.9 % IJ SOLN
10.0000 mL | INTRAMUSCULAR | Status: DC | PRN
  Administered 2015-05-18: 10 mL via INTRAVENOUS
  Filled 2015-05-18: qty 10

## 2015-05-18 NOTE — Progress Notes (Signed)
[name] presented for Portacath access and flush. Portacath located right chest wall accessed with  H 20 needle.  Good blood return present. Portacath flushed with 65ml NS and 500U/74ml Heparin and needle removed intact.  Procedure tolerated well and without incident.

## 2015-05-18 NOTE — Patient Instructions (Signed)
[place] at [facility] Discharge Instructions  RECOMMENDATIONS MADE BY THE CONSULTANT AND ANY TEST RESULTS WILL BE SENT TO YOUR REFERRING PHYSICIAN.  Port flush today. Return as scheduled on June 07, 2015 for office visit.   Thank you for choosing [place] at [facility] to provide your oncology and hematology care.  To afford each patient quality time with our provider, please arrive at least 15 minutes before your scheduled appointment time.    You need to re-schedule your appointment should you arrive 10 or more minutes late.  We strive to give you quality time with our providers, and arriving late affects you and other patients whose appointments are after yours.  Also, if you no show three or more times for appointments you may be dismissed from the clinic at the providers discretion.     Again, thank you for choosing [facility].  Our hope is that these requests will decrease the amount of time that you wait before being seen by our physicians.       _____________________________________________________________  Should you have questions after your visit to [facility], please contact our office at [phone] between the hours of 8:30 a.m. and 4:30 p.m.  Voicemails left after 4:30 p.m. will not be returned until the following business day.  For prescription refill requests, have your pharmacy contact our office.

## 2015-06-06 ENCOUNTER — Encounter (HOSPITAL_COMMUNITY): Payer: Medicare HMO | Attending: Hematology

## 2015-06-06 DIAGNOSIS — C482 Malignant neoplasm of peritoneum, unspecified: Secondary | ICD-10-CM | POA: Insufficient documentation

## 2015-06-06 DIAGNOSIS — D631 Anemia in chronic kidney disease: Secondary | ICD-10-CM | POA: Insufficient documentation

## 2015-06-06 DIAGNOSIS — N189 Chronic kidney disease, unspecified: Secondary | ICD-10-CM | POA: Insufficient documentation

## 2015-06-06 DIAGNOSIS — C569 Malignant neoplasm of unspecified ovary: Secondary | ICD-10-CM | POA: Insufficient documentation

## 2015-06-06 LAB — CBC WITH DIFFERENTIAL/PLATELET
Basophils Absolute: 0.1 10*3/uL (ref 0.0–0.1)
Basophils Relative: 1 % (ref 0–1)
EOS PCT: 2 % (ref 0–5)
Eosinophils Absolute: 0.2 10*3/uL (ref 0.0–0.7)
HCT: 34.6 % — ABNORMAL LOW (ref 36.0–46.0)
Hemoglobin: 10.6 g/dL — ABNORMAL LOW (ref 12.0–15.0)
LYMPHS ABS: 2.3 10*3/uL (ref 0.7–4.0)
LYMPHS PCT: 31 % (ref 12–46)
MCH: 25.2 pg — ABNORMAL LOW (ref 26.0–34.0)
MCHC: 30.6 g/dL (ref 30.0–36.0)
MCV: 82.4 fL (ref 78.0–100.0)
MONO ABS: 0.6 10*3/uL (ref 0.1–1.0)
MONOS PCT: 8 % (ref 3–12)
Neutro Abs: 4.3 10*3/uL (ref 1.7–7.7)
Neutrophils Relative %: 58 % (ref 43–77)
PLATELETS: 201 10*3/uL (ref 150–400)
RBC: 4.2 MIL/uL (ref 3.87–5.11)
RDW: 16.8 % — ABNORMAL HIGH (ref 11.5–15.5)
WBC: 7.4 10*3/uL (ref 4.0–10.5)

## 2015-06-06 LAB — COMPREHENSIVE METABOLIC PANEL
ALK PHOS: 63 U/L (ref 38–126)
ALT: 14 U/L (ref 14–54)
ANION GAP: 8 (ref 5–15)
AST: 17 U/L (ref 15–41)
Albumin: 3.7 g/dL (ref 3.5–5.0)
BUN: 25 mg/dL — ABNORMAL HIGH (ref 6–20)
CHLORIDE: 104 mmol/L (ref 101–111)
CO2: 29 mmol/L (ref 22–32)
CREATININE: 1.07 mg/dL — AB (ref 0.44–1.00)
Calcium: 8.8 mg/dL — ABNORMAL LOW (ref 8.9–10.3)
GFR calc Af Amer: 56 mL/min — ABNORMAL LOW (ref >60)
GFR, EST NON AFRICAN AMERICAN: 48 mL/min — AB (ref >60)
Glucose, Bld: 103 mg/dL — ABNORMAL HIGH (ref 65–99)
POTASSIUM: 3.8 mmol/L (ref 3.5–5.1)
Sodium: 141 mmol/L (ref 135–145)
Total Bilirubin: 0.6 mg/dL (ref 0.3–1.2)
Total Protein: 6.9 g/dL (ref 6.5–8.1)

## 2015-06-06 NOTE — Progress Notes (Signed)
Labs drawn

## 2015-06-07 ENCOUNTER — Encounter (HOSPITAL_BASED_OUTPATIENT_CLINIC_OR_DEPARTMENT_OTHER): Payer: Medicare HMO | Admitting: Hematology & Oncology

## 2015-06-07 ENCOUNTER — Encounter (HOSPITAL_COMMUNITY): Payer: Self-pay | Admitting: Hematology & Oncology

## 2015-06-07 ENCOUNTER — Other Ambulatory Visit (HOSPITAL_COMMUNITY): Payer: Self-pay | Admitting: Hematology & Oncology

## 2015-06-07 VITALS — BP 130/67 | HR 78 | Temp 98.2°F | Resp 16 | Wt 161.0 lb

## 2015-06-07 DIAGNOSIS — M545 Low back pain, unspecified: Secondary | ICD-10-CM

## 2015-06-07 DIAGNOSIS — Z139 Encounter for screening, unspecified: Secondary | ICD-10-CM

## 2015-06-07 DIAGNOSIS — Z1231 Encounter for screening mammogram for malignant neoplasm of breast: Secondary | ICD-10-CM

## 2015-06-07 DIAGNOSIS — R109 Unspecified abdominal pain: Secondary | ICD-10-CM

## 2015-06-07 DIAGNOSIS — C482 Malignant neoplasm of peritoneum, unspecified: Secondary | ICD-10-CM

## 2015-06-07 DIAGNOSIS — M1710 Unilateral primary osteoarthritis, unspecified knee: Secondary | ICD-10-CM

## 2015-06-07 LAB — CA 125: CA 125: 21.7 U/mL (ref 0.0–38.1)

## 2015-06-07 NOTE — Patient Instructions (Signed)
[place] at [facility] Discharge Instructions  RECOMMENDATIONS MADE BY THE CONSULTANT AND ANY TEST RESULTS WILL BE SENT TO YOUR REFERRING PHYSICIAN.  Exam and discussion today with Dr. Whitney Muse. Return in 2 months for office visit and lab work.  Thank you for choosing [place] at [facility] to provide your oncology and hematology care.  To afford each patient quality time with our provider, please arrive at least 15 minutes before your scheduled appointment time.    You need to re-schedule your appointment should you arrive 10 or more minutes late.  We strive to give you quality time with our providers, and arriving late affects you and other patients whose appointments are after yours.  Also, if you no show three or more times for appointments you may be dismissed from the clinic at the providers discretion.     Again, thank you for choosing [facility].  Our hope is that these requests will decrease the amount of time that you wait before being seen by our physicians.       _____________________________________________________________  Should you have questions after your visit to [facility], please contact our office at [phone] between the hours of 8:30 a.m. and 4:30 p.m.  Voicemails left after 4:30 p.m. will not be returned until the following business day.  For prescription refill requests, have your pharmacy contact our office.

## 2015-06-07 NOTE — Progress Notes (Signed)
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 BRCA2 is positive on tumor cells present in the pleural fluid making the patient a candidate for Olaparib as second line treatment should current regimen become ineffective or too toxic. Began Carboplatin/Paclitaxel on 09/22/2014. On day 8 cycle 3 Abraxane substituted for Taxol.   INTERVAL HISTORY: [name] 80 y.o. female returns for follow-up of her primary peritoneal carcinoma.Her appetite is excellent. She continues to have low left flank pain/rib pain. It hurts when she takes deep breaths or rubs on the area. It also hurts when she lies on her left side. She states this has been present since her admission to the [facility]. She needs one or 2 pain pills a day.   Her breathing is good. She sleeps fairly well. She reports some mild neuropathy of the fingers and toes from her chemotherapy, this does not limit her ADLs.   The patient is present with her husband today and her major complaint is her back pain/side pain.  She also notes she eats too much and has gained a lot of weight back. She denies numbness, tingling, and shooting pain.  She notes that she bruises easily on her arms, her skin "tears" when she barely touches it.  She does not like the appearance.  MEDICAL HISTORY: Past Medical History  Diagnosis Date  . GERD (gastroesophageal reflux disease)   . Osteoarthritis   . Peripheral edema   . CHF (congestive heart failure), NYHA class IV     diastolic  . Cancer     has GERD; CONSTIPATION, CHRONIC; HEMATOCHEZIA; OSTEOARTHRITIS, LOWER LEG; KNEE PAIN; BURSITIS, KNEE; Dysphagia; Malignant pleural  effusion; Edema of both legs; Heart failure, diastolic, with acute decompensation; Hyperkalemia; Anemia, unspecified; Peritoneal carcinoma; Rectal bleed; Chest pain; Protein-calorie malnutrition, severe; Dysphagia, pharyngoesophageal phase; Constipation; Acute on chronic congestive heart failure; Acute on chronic respiratory failure with hypoxia; Ovarian cancer; Anemia due to chemotherapy; and Cellulitis on her problem list.      Ovarian cancer   07/25/2014 Pathology Results Left pleural effusion positive for malignant cell consistent with adenocarcinoma.   07/27/2014 - 08/05/2014 Hospital Admission Heart failure with malignant pleural effusion   07/28/2014 Pathology Results Left pleural effusion positive for malignant cells consistent with adenocarcinoma (CK7 pos, CK20 neg, WT-1 pos, ER weakly pos, PR neg, TTF-1 neg, GCDFP-15 neg).  Findings favor gyn/ovarian primary.   08/01/2014 Pathology Results [facility] genomic findings: BRCA2 R232f*31, STK11 loss, MYC amplification-equivocal, TP53 R273C, MLL2 QK9179.  Targeted treatments include Olaparib (BRCA2) and Everolimus/Temsirolimus (STK11).   08/03/2014 Tumor Marker CA 125 4954   08/04/2014 Procedure Left pleurX catheter by Dr. VLucianne LeiTright   09/08/2014 - 09/14/2014 Hospital Admission Hospitalized with the following main issues: acute on chronic CHF, acute on chronic respiratory failure with hypoxia, and malignant plueral effusion on right    09/13/2014 Procedure Right PleurX catheter by Dr. BCyndia Bent  09/13/2014 Pathology Results Right pleural effusion positive for malignant adenocarcinoma   09/21/2014 Tumor Marker CA 125 3869   09/22/2014 - 11/17/2014 Chemotherapy Carboplatin/Paclitaxel days 1, 8, 15 every 28 days.   10/20/2014 Tumor Marker CA 125 335   11/23/2014 -  Chemotherapy Carboplatin/Abraxane day 1, 8, 15 every 28 days.  is allergic to penicillins.  [name] had no medications administered during this visit.  SURGICAL HISTORY: Past  Surgical History  Procedure Laterality Date  . Cholecystectomy    . Cardiac catheterization    . Total abdominal hysterectomy  40 years ago    patient does not know if ovaries were removed  . Appendectomy    . Total knee arthroplasty Left   . Chest tube insertion Left 08/04/2014    Procedure: INSERTION PLEURAL DRAINAGE CATHETER;  Surgeon: Ivin Poot, MD;  Location: [place];  Service: Thoracic;  Laterality: Left;  . Chest tube insertion Right 09/13/2014    Procedure: INSERTION PLEURAL DRAINAGE CATHETER;  Surgeon: Gaye Pollack, MD;  Location: [place];  Service: Thoracic;  Laterality: Right;  . Removal of pleural drainage catheter Bilateral 12/06/2014    Procedure: REMOVAL OF PLEURAL DRAINAGE CATHETER;  Surgeon: Ivin Poot, MD;  Location: [place];  Service: Thoracic;  Laterality: Bilateral;    SOCIAL HISTORY: History   Social History  . Marital Status: Widowed    Spouse Name: N/A  . Number of Children: N/A  . Years of Education: N/A   Occupational History  . nurses aid    Social History Main Topics  . Smoking status: Never Smoker   . Smokeless tobacco: Not on file  . Alcohol Use: No  . Drug Use: No  . Sexual Activity: Not on file   Other Topics Concern  . Not on file   Social History Narrative    FAMILY HISTORY: Noncontributory   Review of Systems  Constitutional: Negative for fever, chills, fatigue, and weight loss.  HENT: Negative for congestion, hearing loss, nosebleeds, sore throat and tinnitus.   Eyes: Negative for blurred vision, double vision, pain and discharge.  Respiratory: Negative for cough, hemoptysis, sputum production, shortness of breath and wheezing.  Positive for difficulty breathing. Cardiovascular: Negative for chest pain, palpitations, claudication, leg swelling and PND.  Gastrointestinal: Negative for heartburn, nausea, vomiting, abdominal pain, diarrhea, constipation, blood in stool and melena.  Genitourinary: Negative for dysuria, urgency,  frequency and hematuria.  Musculoskeletal: Positive for back pain. Negative for myalgias, joint pain and falls.  Skin: Negative for itching and rash.  Neurological: Negative for dizziness, tingling, tremors, speech change, focal weakness, sensory changes, seizures, loss of consciousness, weakness and headaches.  Endo/Heme/Allergies: Does not bruise/bleed easily.  Psychiatric/Behavioral: Negative for depression, suicidal ideas, memory loss and substance abuse. The patient is not nervous/anxious and does not have insomnia.   14 point review of systems was performed and is negative except as detailed under history of present illness and above   PHYSICAL EXAMINATION  ECOG PERFORMANCE STATUS: 1 - Symptomatic but completely ambulatory  Filed Vitals:   06/07/15 1426  BP: 130/67  Pulse: 78  Temp: 98.2 F (36.8 C)  Resp: 16    Physical Exam  Constitutional: She is oriented to person, place, and time and well-developed, well-nourished, and in no distress.  HENT:  Head: Normocephalic and atraumatic.  Mouth/Throat: No oropharyngeal exudate.  Eyes: Conjunctivae and EOM are normal. Pupils are equal, round, and reactive to light. No scleral icterus.  Neck: Normal range of motion. Neck supple. No JVD present. No thyromegaly present.  Cardiovascular: Normal rate and regular rhythm.  Exam reveals no friction rub.   No murmur heard. Pulmonary/Chest: Effort normal and breath sounds normal. No respiratory distress. She has no wheezes. She has no rales.  Abdominal: Soft. Bowel sounds are normal. She exhibits no distension. There is tenderness.  There is no rebound and no guarding.  Tenderness mild, in the left lower back and side. pain with palpation over the lateral and posterior lower left ribs, no palpable mass Musculoskeletal: Normal range of motion.  Lymphadenopathy:    She has no cervical adenopathy.  Neurological: She is alert and oriented to person, place, and time. No cranial nerve deficit.  Coordination normal.  Skin: Skin is warm and dry.  Psychiatric: Mood, memory, affect and judgment normal.    LABORATORY DATA:  CBC    Component Value Date/Time   WBC 7.4 06/06/2015 1128   RBC 4.20 06/06/2015 1128   RBC 4.29 07/27/2014 2257   HGB 10.6* 06/06/2015 1128   HCT 34.6* 06/06/2015 1128   PLT 201 06/06/2015 1128   MCV 82.4 06/06/2015 1128   MCH 25.2* 06/06/2015 1128   MCHC 30.6 06/06/2015 1128   RDW 16.8* 06/06/2015 1128   LYMPHSABS 2.3 06/06/2015 1128   MONOABS 0.6 06/06/2015 1128   EOSABS 0.2 06/06/2015 1128   BASOSABS 0.1 06/06/2015 1128   CMP     Component Value Date/Time   NA 141 06/06/2015 1128   K 3.8 06/06/2015 1128   CL 104 06/06/2015 1128   CO2 29 06/06/2015 1128   GLUCOSE 103* 06/06/2015 1128   BUN 25* 06/06/2015 1128   CREATININE 1.07* 06/06/2015 1128   CALCIUM 8.8* 06/06/2015 1128   PROT 6.9 06/06/2015 1128   ALBUMIN 3.7 06/06/2015 1128   AST 17 06/06/2015 1128   ALT 14 06/06/2015 1128   ALKPHOS 63 06/06/2015 1128   BILITOT 0.6 06/06/2015 1128   GFRNONAA 48* 06/06/2015 1128   GFRAA 56* 06/06/2015 1128   [name], [name] (MRN [id number]) as of 06/18/2015 19:21  Ref. Range 03/09/2015 09:12 04/06/2015 13:30 04/06/2015 15:04 06/06/2015 11:28  CA 125 Latest Ref Range: 0.0-38.1 U/mL  23.9  21.7   ASSESSMENT and THERAPY PLAN:   Stage IV primary peritoneal carcinoma BRCA 2 positivity  We will continue with observation. Her last CA 125 was normal. Clinically she is doing well. She has not had a screening mammogram per the records since late 2014,screening mammography has been ordered. I would like to see her back in 8 weeks' time.   In regards to her side/back pain I have recommended an MRI of the back for additional evaluation. I am uncertain at this point of the cause. Rib series had a question of old fractures of the left fifth and sixth rib.  Follow up in 2 months with repeat labs. I addressed her BRCA 2 positivity today and advised her that we  need to discuss the potential impact on the remainder of her family. This has only been intermittently addressed.  All questions were answered. The patient knows to call the clinic with any problems, questions or concerns. We can certainly see the patient much sooner if necessary.   This note was signed electronically  This document serves as a record of services personally performed by Ancil Linsey, MD. It was created on her behalf by Janace Hoard, a trained medical scribe. The creation of this record is based on the scribe's personal observations and the provider's statements to them. This document has been checked and approved by the attending provider.  I have reviewed the above documentation for accuracy and completeness and I agree with the above.  Kelby Fam. Whitney Muse, MD

## 2015-06-12 ENCOUNTER — Telehealth (HOSPITAL_COMMUNITY): Payer: Self-pay | Admitting: Oncology

## 2015-06-12 NOTE — Telephone Encounter (Signed)
GAVE PRE AUTH FORM TO T.K

## 2015-06-14 ENCOUNTER — Other Ambulatory Visit (HOSPITAL_COMMUNITY): Payer: Self-pay | Admitting: Oncology

## 2015-06-20 ENCOUNTER — Ambulatory Visit (HOSPITAL_COMMUNITY)
Admission: RE | Admit: 2015-06-20 | Discharge: 2015-06-20 | Disposition: A | Discharge status: Home / Self Care | Payer: Medicare HMO | Source: Ambulatory Visit | Attending: Hematology & Oncology | Admitting: Hematology & Oncology

## 2015-06-20 DIAGNOSIS — K5730 Diverticulosis of large intestine without perforation or abscess without bleeding: Secondary | ICD-10-CM | POA: Insufficient documentation

## 2015-06-20 DIAGNOSIS — M545 Low back pain, unspecified: Secondary | ICD-10-CM

## 2015-06-20 DIAGNOSIS — M5136 Other intervertebral disc degeneration, lumbar region: Secondary | ICD-10-CM | POA: Insufficient documentation

## 2015-06-20 DIAGNOSIS — C482 Malignant neoplasm of peritoneum, unspecified: Secondary | ICD-10-CM

## 2015-06-20 DIAGNOSIS — M4806 Spinal stenosis, lumbar region: Secondary | ICD-10-CM | POA: Insufficient documentation

## 2015-06-20 DIAGNOSIS — M1710 Unilateral primary osteoarthritis, unspecified knee: Secondary | ICD-10-CM

## 2015-06-20 DIAGNOSIS — C786 Secondary malignant neoplasm of retroperitoneum and peritoneum: Secondary | ICD-10-CM | POA: Insufficient documentation

## 2015-06-20 DIAGNOSIS — G128 Other spinal muscular atrophies and related syndromes: Secondary | ICD-10-CM | POA: Insufficient documentation

## 2015-06-20 DIAGNOSIS — R531 Weakness: Secondary | ICD-10-CM | POA: Diagnosis not present

## 2015-06-20 MED ORDER — GADOBENATE DIMEGLUMINE 529 MG/ML IV SOLN
15.0000 mL | Freq: Once | INTRAVENOUS | Status: AC | PRN
  Administered 2015-06-20: 15 mL via INTRAVENOUS

## 2015-06-21 ENCOUNTER — Ambulatory Visit (HOSPITAL_COMMUNITY)
Admission: RE | Admit: 2015-06-21 | Discharge: 2015-06-21 | Disposition: A | Discharge status: Home / Self Care | Payer: Medicare HMO | Source: Ambulatory Visit | Attending: Hematology & Oncology | Admitting: Hematology & Oncology

## 2015-06-21 DIAGNOSIS — Z1231 Encounter for screening mammogram for malignant neoplasm of breast: Secondary | ICD-10-CM

## 2015-06-29 ENCOUNTER — Encounter (HOSPITAL_COMMUNITY): Payer: Medicare HMO

## 2015-07-13 ENCOUNTER — Encounter (HOSPITAL_COMMUNITY): Payer: Medicare HMO | Attending: Hematology & Oncology

## 2015-07-13 VITALS — BP 122/62 | HR 75 | Temp 98.1°F | Resp 20

## 2015-07-13 DIAGNOSIS — Z9889 Other specified postprocedural states: Secondary | ICD-10-CM | POA: Diagnosis not present

## 2015-07-13 DIAGNOSIS — Z95828 Presence of other vascular implants and grafts: Secondary | ICD-10-CM

## 2015-07-13 MED ORDER — HEPARIN SOD (PORK) LOCK FLUSH 100 UNIT/ML IV SOLN
500.0000 [IU] | Freq: Once | INTRAVENOUS | Status: AC
  Administered 2015-07-13: 500 [IU] via INTRAVENOUS
  Filled 2015-07-13: qty 5

## 2015-07-13 MED ORDER — SODIUM CHLORIDE 0.9 % IJ SOLN
10.0000 mL | Freq: Once | INTRAMUSCULAR | Status: AC
  Administered 2015-07-13: 10 mL via INTRAVENOUS

## 2015-07-13 NOTE — Progress Notes (Signed)
[name] presented for Portacath access and flush. Proper placement of portacath confirmed by CXR. Portacath located right chest wall accessed with  H 20 needle. Good blood return present. Portacath flushed with 66ml NS and 500U/39ml Heparin and needle removed intact. Procedure without incident. Patient tolerated procedure well.

## 2015-07-13 NOTE — Patient Instructions (Signed)
[facility] at [facility] Discharge Instructions  RECOMMENDATIONS MADE BY THE CONSULTANT AND ANY TEST RESULTS WILL BE SENT TO YOUR REFERRING PHYSICIAN.  Port flush today as scheduled. Return as scheduled.  Thank you for choosing [facility] at [facility] to provide your oncology and hematology care.  To afford each patient quality time with our provider, please arrive at least 15 minutes before your scheduled appointment time.    You need to re-schedule your appointment should you arrive 10 or more minutes late.  We strive to give you quality time with our providers, and arriving late affects you and other patients whose appointments are after yours.  Also, if you no show three or more times for appointments you may be dismissed from the clinic at the providers discretion.     Again, thank you for choosing [facility].  Our hope is that these requests will decrease the amount of time that you wait before being seen by our physicians.       _____________________________________________________________  Should you have questions after your visit to [facility], please contact our office at [phone] between the hours of 8:30 a.m. and 4:30 p.m.  Voicemails left after 4:30 p.m. will not be returned until the following business day.  For prescription refill requests, have your pharmacy contact our office.    

## 2015-08-04 ENCOUNTER — Emergency Department (HOSPITAL_COMMUNITY)
Admission: EM | Admit: 2015-08-04 | Discharge: 2015-08-04 | Disposition: A | Discharge status: Home / Self Care | Payer: Medicare HMO | Attending: Emergency Medicine | Admitting: Emergency Medicine

## 2015-08-04 ENCOUNTER — Encounter (HOSPITAL_COMMUNITY): Payer: Medicare HMO | Attending: Hematology

## 2015-08-04 ENCOUNTER — Encounter (HOSPITAL_COMMUNITY): Payer: Self-pay | Admitting: Emergency Medicine

## 2015-08-04 DIAGNOSIS — Z8543 Personal history of malignant neoplasm of ovary: Secondary | ICD-10-CM | POA: Diagnosis not present

## 2015-08-04 DIAGNOSIS — N390 Urinary tract infection, site not specified: Secondary | ICD-10-CM | POA: Diagnosis not present

## 2015-08-04 DIAGNOSIS — I509 Heart failure, unspecified: Secondary | ICD-10-CM | POA: Insufficient documentation

## 2015-08-04 DIAGNOSIS — C482 Malignant neoplasm of peritoneum, unspecified: Secondary | ICD-10-CM | POA: Diagnosis not present

## 2015-08-04 DIAGNOSIS — Z880 Allergy status to penicillin: Secondary | ICD-10-CM | POA: Insufficient documentation

## 2015-08-04 DIAGNOSIS — Z7952 Long term (current) use of systemic steroids: Secondary | ICD-10-CM | POA: Insufficient documentation

## 2015-08-04 DIAGNOSIS — K219 Gastro-esophageal reflux disease without esophagitis: Secondary | ICD-10-CM | POA: Diagnosis not present

## 2015-08-04 DIAGNOSIS — C569 Malignant neoplasm of unspecified ovary: Secondary | ICD-10-CM | POA: Diagnosis present

## 2015-08-04 DIAGNOSIS — M545 Low back pain, unspecified: Secondary | ICD-10-CM

## 2015-08-04 DIAGNOSIS — D631 Anemia in chronic kidney disease: Secondary | ICD-10-CM | POA: Insufficient documentation

## 2015-08-04 DIAGNOSIS — N189 Chronic kidney disease, unspecified: Secondary | ICD-10-CM | POA: Diagnosis present

## 2015-08-04 DIAGNOSIS — R1030 Lower abdominal pain, unspecified: Secondary | ICD-10-CM | POA: Diagnosis present

## 2015-08-04 DIAGNOSIS — Z139 Encounter for screening, unspecified: Secondary | ICD-10-CM

## 2015-08-04 DIAGNOSIS — Z79899 Other long term (current) drug therapy: Secondary | ICD-10-CM | POA: Diagnosis not present

## 2015-08-04 DIAGNOSIS — Z862 Personal history of diseases of the blood and blood-forming organs and certain disorders involving the immune mechanism: Secondary | ICD-10-CM | POA: Insufficient documentation

## 2015-08-04 DIAGNOSIS — R7989 Other specified abnormal findings of blood chemistry: Secondary | ICD-10-CM | POA: Diagnosis not present

## 2015-08-04 DIAGNOSIS — M1990 Unspecified osteoarthritis, unspecified site: Secondary | ICD-10-CM | POA: Insufficient documentation

## 2015-08-04 DIAGNOSIS — M1710 Unilateral primary osteoarthritis, unspecified knee: Secondary | ICD-10-CM

## 2015-08-04 LAB — COMPREHENSIVE METABOLIC PANEL
ALK PHOS: 68 U/L (ref 38–126)
ALT: 18 U/L (ref 14–54)
ALT: 12 U/L — AB (ref 14–54)
AST: 20 U/L (ref 15–41)
AST: 18 U/L (ref 15–41)
Albumin: 3.5 g/dL (ref 3.5–5.0)
Albumin: 3.6 g/dL (ref 3.5–5.0)
Alkaline Phosphatase: 34 U/L — ABNORMAL LOW (ref 38–126)
Anion gap: 7 (ref 5–15)
Anion gap: 8 (ref 5–15)
BILIRUBIN TOTAL: 0.7 mg/dL (ref 0.3–1.2)
BUN: 38 mg/dL — ABNORMAL HIGH (ref 6–20)
BUN: 28 mg/dL — AB (ref 6–20)
CHLORIDE: 98 mmol/L — AB (ref 101–111)
CHLORIDE: 105 mmol/L (ref 101–111)
CO2: 31 mmol/L (ref 22–32)
CO2: 27 mmol/L (ref 22–32)
CREATININE: 0.97 mg/dL (ref 0.44–1.00)
Calcium: 8.9 mg/dL (ref 8.9–10.3)
Calcium: 8.6 mg/dL — ABNORMAL LOW (ref 8.9–10.3)
Creatinine, Ser: 5.75 mg/dL — ABNORMAL HIGH (ref 0.44–1.00)
GFR calc non Af Amer: 6 mL/min — ABNORMAL LOW (ref >60)
GFR calc non Af Amer: 55 mL/min — ABNORMAL LOW (ref >60)
GFR, EST AFRICAN AMERICAN: 7 mL/min — AB (ref >60)
GLUCOSE: 121 mg/dL — AB (ref 65–99)
Glucose, Bld: 108 mg/dL — ABNORMAL HIGH (ref 65–99)
POTASSIUM: 4.1 mmol/L (ref 3.5–5.1)
Potassium: 4 mmol/L (ref 3.5–5.1)
SODIUM: 140 mmol/L (ref 135–145)
Sodium: 136 mmol/L (ref 135–145)
Total Bilirubin: 0.3 mg/dL (ref 0.3–1.2)
Total Protein: 6.6 g/dL (ref 6.5–8.1)
Total Protein: 6.7 g/dL (ref 6.5–8.1)

## 2015-08-04 LAB — URINALYSIS, ROUTINE W REFLEX MICROSCOPIC
Bilirubin Urine: NEGATIVE
Glucose, UA: NEGATIVE mg/dL
Ketones, ur: NEGATIVE mg/dL
Nitrite: NEGATIVE
PH: 6 (ref 5.0–8.0)
Protein, ur: NEGATIVE mg/dL
Specific Gravity, Urine: >1.03 — ABNORMAL HIGH (ref 1.005–1.030)
UROBILINOGEN UA: 0.2 mg/dL (ref 0.0–1.0)

## 2015-08-04 LAB — URINE MICROSCOPIC-ADD ON

## 2015-08-04 LAB — CBC WITH DIFFERENTIAL/PLATELET
BASOS ABS: 0 10*3/uL (ref 0.0–0.1)
Basophils Relative: 0 % (ref 0–1)
EOS PCT: 2 % (ref 0–5)
Eosinophils Absolute: 0.2 10*3/uL (ref 0.0–0.7)
HCT: 37.7 % (ref 36.0–46.0)
Hemoglobin: 11.4 g/dL — ABNORMAL LOW (ref 12.0–15.0)
LYMPHS PCT: 23 % (ref 12–46)
Lymphs Abs: 2.5 10*3/uL (ref 0.7–4.0)
MCH: 24.6 pg — ABNORMAL LOW (ref 26.0–34.0)
MCHC: 30.2 g/dL (ref 30.0–36.0)
MCV: 81.3 fL (ref 78.0–100.0)
MONO ABS: 0.8 10*3/uL (ref 0.1–1.0)
MONOS PCT: 7 % (ref 3–12)
NEUTROS ABS: 7.5 10*3/uL (ref 1.7–7.7)
Neutrophils Relative %: 68 % (ref 43–77)
PLATELETS: 217 10*3/uL (ref 150–400)
RBC: 4.64 MIL/uL (ref 3.87–5.11)
RDW: 16.7 % — AB (ref 11.5–15.5)
WBC: 10.9 10*3/uL — ABNORMAL HIGH (ref 4.0–10.5)

## 2015-08-04 MED ORDER — SULFAMETHOXAZOLE-TRIMETHOPRIM 800-160 MG PO TABS: 1.0000 | ORAL_TABLET | Freq: Two times a day (BID) | ORAL | Status: DC

## 2015-08-04 NOTE — ED Notes (Addendum)
Pt states she blood drawn at the cancer center yesterday. States she received a phone call stating that "some lab value was elevated and she needed to come to ED immediately." Pt unsure of what lab value was. Pt unsure what kind of cancer she has. Pt has no other complaints at this time.

## 2015-08-04 NOTE — ED Provider Notes (Signed)
CSN: [id number]     Arrival date & time 08/04/15  1622 History   First MD Initiated Contact with Patient 08/04/15 1634     Chief Complaint  Patient presents with  . Abnormal Lab     (Consider location/radiation/quality/duration/timing/severity/associated sxs/prior Treatment) The history is provided by the patient.   patient was sent in for elevated creatinine. Was seen at [place] and had labs drawn for her chronic anemia. Previously had ovarian cancer but has not been on chemotherapy for several months. Patient states she's had back pain and some lower abdominal pain but otherwise felt fine. States she may be urinating more frequent lately. Creatinine was reportedly 5.7 with creatinine of 1.07 1 month ago.   Past Medical History  Diagnosis Date  . GERD (gastroesophageal reflux disease)   . Osteoarthritis   . Peripheral edema   . CHF (congestive heart failure), NYHA class IV     diastolic  . Cancer    Past Surgical History  Procedure Laterality Date  . Cholecystectomy    . Cardiac catheterization    . Total abdominal hysterectomy  40 years ago    patient does not know if ovaries were removed  . Appendectomy    . Total knee arthroplasty Left   . Chest tube insertion Left 08/04/2014    Procedure: INSERTION PLEURAL DRAINAGE CATHETER;  Surgeon: Ivin Poot, MD;  Location: [place];  Service: Thoracic;  Laterality: Left;  . Chest tube insertion Right 09/13/2014    Procedure: INSERTION PLEURAL DRAINAGE CATHETER;  Surgeon: Gaye Pollack, MD;  Location: [place];  Service: Thoracic;  Laterality: Right;  . Removal of pleural drainage catheter Bilateral 12/06/2014    Procedure: REMOVAL OF PLEURAL DRAINAGE CATHETER;  Surgeon: Ivin Poot, MD;  Location: [place];  Service: Thoracic;  Laterality: Bilateral;   No family history on file. Social History  Substance Use Topics  . Smoking status: Never Smoker   . Smokeless tobacco: None  . Alcohol Use: No   OB History    No data available      Review of Systems  Constitutional: Negative for appetite change.  Eyes: Negative for pain.  Respiratory: Negative for choking.   Gastrointestinal: Positive for abdominal pain.  Genitourinary: Positive for frequency.  Musculoskeletal: Positive for back pain.  Skin: Negative for wound.  Neurological: Negative for numbness.  Hematological: Negative for adenopathy.      Allergies  Penicillins  Home Medications   Prior to Admission medications   Medication Sig Start Date End Date Taking? Authorizing Provider  docusate sodium (COLACE) 100 MG capsule Take 100 mg by mouth 2 (two) times daily.    Historical Provider, MD  furosemide (LASIX) 20 MG tablet Take 20 mg by mouth daily.    Historical Provider, MD  KLOR-CON M10 10 MEQ tablet  07/28/15   Historical Provider, MD  lactulose (CHRONULAC) 10 GM/15ML solution Take 30 mLs (20 g total) by mouth 2 (two) times daily as needed for moderate constipation. 09/14/14   Kelvin Cellar, MD  metoCLOPramide (REGLAN) 5 MG tablet Take 5 mg by mouth 4 (four) times daily.    Historical Provider, MD  mometasone (ELOCON) 0.1 % cream Apply 1 application topically daily. To affected areas (arms and hands)    Historical Provider, MD  Nutritional Supplements (ENSURE CLEAR) LIQD Take 1 Bottle by mouth 3 (three) times daily.    Historical Provider, MD  oxyCODONE (ROXICODONE) 5 MG immediate release tablet Take two tablet by mouth every 4 hours  as needed for pain 02/16/15   Patrici Ranks, MD  pantoprazole (PROTONIX) 40 MG tablet Take 1 tablet (40 mg total) by mouth daily. 08/12/14   Milton Ferguson, MD  polyethylene glycol powder [facility]) powder  03/01/15   Historical Provider, MD  potassium chloride SA (K-DUR,KLOR-CON) 20 MEQ tablet Take 20 mEq by mouth 3 (three) times daily.     Historical Provider, MD  predniSONE (DELTASONE) 10 MG tablet Take 1 tablet (10 mg total) by mouth daily with breakfast. 10/20/14   [facility], PA-C   sulfamethoxazole-trimethoprim (BACTRIM DS,SEPTRA DS) 800-160 MG per tablet Take 1 tablet by mouth 2 (two) times daily. 08/04/15   Davonna Belling, MD   BP 126/72 mmHg  Pulse 65  Temp(Src) 98.3 F (36.8 C) (Oral)  Resp 16  SpO2 95% Physical Exam  Constitutional: She appears well-developed.  HENT:  Head: Atraumatic.  Neck: Neck supple.  Cardiovascular: Normal rate.   Pulmonary/Chest: Effort normal.  Abdominal: Soft. She exhibits distension. There is tenderness.  Mild distention but also some inferior tenderness. No hernias palpated  Musculoskeletal: Normal range of motion.  Neurological: She is alert.  Skin: Skin is warm.    ED Course  Procedures (including critical care time) Labs Review Labs Reviewed  COMPREHENSIVE METABOLIC PANEL - Abnormal; Notable for the following:    Glucose, Bld 121 (*)    BUN 28 (*)    Calcium 8.6 (*)    ALT 12 (*)    GFR calc non Af Amer 55 (*)    All other components within normal limits  URINALYSIS, ROUTINE W REFLEX MICROSCOPIC (NOT AT [facility]) - Abnormal; Notable for the following:    Specific Gravity, Urine >1.030 (*)    Hgb urine dipstick SMALL (*)    Leukocytes, UA SMALL (*)    All other components within normal limits  URINE MICROSCOPIC-ADD ON - Abnormal; Notable for the following:    Squamous Epithelial / LPF FEW (*)    Bacteria, UA MANY (*)    All other components within normal limits  URINE CULTURE    Imaging Review No results found. I have personally reviewed and evaluated these images and lab results as part of my medical decision-making.   EKG Interpretation None      MDM   Final diagnoses:  Lower urinary tract infection, acute    Patient sent for increased creatinine. Recheck lab shows that it is normal and the creatinine of 5 was apparently in error. Has had some back pain and urinalysis does show UTI. Will treat with Bactrim and cultures been sent.     Davonna Belling, MD 08/04/15 [phone]

## 2015-08-04 NOTE — Progress Notes (Signed)
Lab draw

## 2015-08-04 NOTE — Discharge Instructions (Signed)

## 2015-08-04 NOTE — ED Notes (Signed)
Post void bladder scan 60cc

## 2015-08-05 LAB — CA 125: CA 125: 23.6 U/mL (ref 0.0–38.1)

## 2015-08-06 LAB — URINE CULTURE

## 2015-08-08 ENCOUNTER — Encounter (HOSPITAL_COMMUNITY): Payer: Self-pay | Admitting: Lab

## 2015-08-08 ENCOUNTER — Encounter (HOSPITAL_COMMUNITY): Payer: Self-pay | Admitting: Hematology & Oncology

## 2015-08-08 ENCOUNTER — Encounter (HOSPITAL_BASED_OUTPATIENT_CLINIC_OR_DEPARTMENT_OTHER): Payer: Medicare HMO | Admitting: Hematology & Oncology

## 2015-08-08 VITALS — BP 113/82 | HR 83 | Temp 98.2°F | Resp 20 | Wt 167.8 lb

## 2015-08-08 DIAGNOSIS — C482 Malignant neoplasm of peritoneum, unspecified: Secondary | ICD-10-CM

## 2015-08-08 DIAGNOSIS — M545 Low back pain: Secondary | ICD-10-CM

## 2015-08-08 DIAGNOSIS — J910 Malignant pleural effusion: Secondary | ICD-10-CM

## 2015-08-08 DIAGNOSIS — R300 Dysuria: Secondary | ICD-10-CM

## 2015-08-08 LAB — URINALYSIS, ROUTINE W REFLEX MICROSCOPIC
Bilirubin Urine: NEGATIVE
Glucose, UA: NEGATIVE mg/dL
Ketones, ur: NEGATIVE mg/dL
Nitrite: NEGATIVE
Protein, ur: NEGATIVE mg/dL
Specific Gravity, Urine: 1.025 (ref 1.005–1.030)
UROBILINOGEN UA: 0.2 mg/dL (ref 0.0–1.0)
pH: 5 (ref 5.0–8.0)

## 2015-08-08 LAB — URINE MICROSCOPIC-ADD ON

## 2015-08-08 MED ORDER — OXYCODONE HCL 5 MG PO TABS: ORAL_TABLET | ORAL | Status: AC

## 2015-08-08 NOTE — Progress Notes (Signed)
Referral sent to [facility]etics at [facility].  Records faxed on 9/6

## 2015-08-08 NOTE — Progress Notes (Signed)
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 BRCA2 is positive on tumor cells present in the pleural fluid making the patient a candidate for Olaparib as second line treatment should current regimen become ineffective or too toxic. Began Carboplatin/Paclitaxel on 09/22/2014. On day 8 cycle 3 Abraxane substituted for Taxol.   INTERVAL HISTORY: [name] 80 y.o. female returns for follow-up of her primary peritoneal carcinoma.Her appetite is excellent. She continues to have low left flank pain/rib pain. It hurts when she takes deep breaths or rubs on the area. It also hurts when she lies on her left side. She states this has been present since her admission to the [facility]. She needs one or 2 pain pills a day.   Her breathing is good. She sleeps fairly well. She reports some mild neuropathy of the fingers and toes from her chemotherapy, this does not limit her ADLs.   Studies performed last week. A creatinine was noted to be 5. She was referred to the emergency department for a repeat creatinine was normal. It was felt to be laboratory error. She was noted however to have a urinary tract infection.  The patient states that her antibiotic has helped improve her UTI some.  She has taken all that she was prescribed.  She is still having pain in her side/lower back. This is a chronic issue. We have obtained x-rays and MRI. She states that she cannot walk long distances or stand to complete the dishes without taking multiple breaks.  She can feel the pain when she breathes.  Her appetite is well.  MEDICAL HISTORY: Past Medical History  Diagnosis Date  . GERD  (gastroesophageal reflux disease)   . Osteoarthritis   . Peripheral edema   . CHF (congestive heart failure), NYHA class IV     diastolic  . Cancer     has GERD; CONSTIPATION, CHRONIC; HEMATOCHEZIA; OSTEOARTHRITIS, LOWER LEG; KNEE PAIN; BURSITIS, KNEE; Dysphagia; Malignant pleural effusion; Edema of both legs; Heart failure, diastolic, with acute decompensation; Hyperkalemia; Anemia, unspecified; Peritoneal carcinoma; Rectal bleed; Chest pain; Protein-calorie malnutrition, severe; Dysphagia, pharyngoesophageal phase; Constipation; Acute on chronic congestive heart failure; Acute on chronic respiratory failure with hypoxia; Ovarian cancer; Anemia due to chemotherapy; and Cellulitis on her problem list.      Ovarian cancer   07/25/2014 Pathology Results Left pleural effusion positive for malignant cell consistent with adenocarcinoma.   07/27/2014 - 08/05/2014 Hospital Admission Heart failure with malignant pleural effusion   07/28/2014 Pathology Results Left pleural effusion positive for malignant cells consistent with adenocarcinoma (CK7 pos, CK20 neg, WT-1 pos, ER weakly pos, PR neg, TTF-1 neg, GCDFP-15 neg).  Findings favor gyn/ovarian primary.   08/01/2014 Pathology Results [facility]- Postive genomic findings: BRCA2 R2328f*31, STK11 loss, MYC amplification-equivocal, TP53 R273C, MLL2 QR1540.  Targeted treatments include Olaparib (BRCA2) and Everolimus/Temsirolimus (STK11).   08/03/2014 Tumor Marker CA 125 4954   08/04/2014 Procedure Left pleurX catheter by Dr. VLucianne LeiTright   09/08/2014 - 09/14/2014 Hospital Admission Hospitalized with the following main issues: acute on chronic CHF, acute on chronic respiratory failure with hypoxia, and malignant plueral effusion on right    09/13/2014 Procedure Right PleurX catheter by Dr. BCyndia Bent  09/13/2014 Pathology Results Right pleural effusion positive  for malignant adenocarcinoma   09/21/2014 Tumor Marker CA 125 3869   09/22/2014 - 11/17/2014 Chemotherapy  Carboplatin/Paclitaxel days 1, 8, 15 every 28 days.   10/20/2014 Tumor Marker CA 125 335   11/23/2014 -  Chemotherapy Carboplatin/Abraxane day 1, 8, 15 every 28 days.     is allergic to penicillins.  Ms. Bolick had no medications administered during this visit.  SURGICAL HISTORY: Past Surgical History  Procedure Laterality Date  . Cholecystectomy    . Cardiac catheterization    . Total abdominal hysterectomy  40 years ago    patient does not know if ovaries were removed  . Appendectomy    . Total knee arthroplasty Left   . Chest tube insertion Left 08/04/2014    Procedure: INSERTION PLEURAL DRAINAGE CATHETER;  Surgeon: Ivin Poot, MD;  Location: [place];  Service: Thoracic;  Laterality: Left;  . Chest tube insertion Right 09/13/2014    Procedure: INSERTION PLEURAL DRAINAGE CATHETER;  Surgeon: Gaye Pollack, MD;  Location: [place];  Service: Thoracic;  Laterality: Right;  . Removal of pleural drainage catheter Bilateral 12/06/2014    Procedure: REMOVAL OF PLEURAL DRAINAGE CATHETER;  Surgeon: Ivin Poot, MD;  Location: [place];  Service: Thoracic;  Laterality: Bilateral;    SOCIAL HISTORY: Social History   Social History  . Marital Status: Widowed    Spouse Name: N/A  . Number of Children: N/A  . Years of Education: N/A   Occupational History  . nurses aid    Social History Main Topics  . Smoking status: Never Smoker   . Smokeless tobacco: Not on file  . Alcohol Use: No  . Drug Use: No  . Sexual Activity: Not on file   Other Topics Concern  . Not on file   Social History Narrative    FAMILY HISTORY: Noncontributory   Review of Systems  Constitutional: Negative for fever, chills, fatigue, and weight loss.  HENT: Negative for congestion, hearing loss, nosebleeds, sore throat and tinnitus.   Eyes: Negative for blurred vision, double vision, pain and discharge.  Respiratory: Negative for cough, hemoptysis, sputum production, shortness of breath and wheezing.   Positive for difficulty breathing. (chronic) Cardiovascular: Negative for chest pain, palpitations, claudication, leg swelling and PND.  Gastrointestinal: Negative for heartburn, nausea, vomiting, abdominal pain, diarrhea, constipation, blood in stool and melena.  Genitourinary: Negative for dysuria, urgency, frequency and hematuria.  Musculoskeletal: Positive for back pain. Negative for myalgias, joint pain and falls.  Skin: Negative for itching and rash.  Neurological: Negative for dizziness, tingling, tremors, speech change, focal weakness, sensory changes, seizures, loss of consciousness, weakness and headaches.  Endo/Heme/Allergies: Does not bruise/bleed easily.  Psychiatric/Behavioral: Negative for depression, suicidal ideas, memory loss and substance abuse. The patient is not nervous/anxious and does not have insomnia.   14 point review of systems was performed and is negative except as detailed under history of present illness and above   PHYSICAL EXAMINATION  ECOG PERFORMANCE STATUS: 1 - Symptomatic but completely ambulatory  Filed Vitals:   08/08/15 1354  BP: 113/82  Pulse: 83  Temp: 98.2 F (36.8 C)  Resp: 20    Physical Exam  Constitutional: She is oriented to person, place, and time and well-developed, well-nourished, and in no distress.  HENT:  Head: Normocephalic and atraumatic.  Mouth/Throat: No oropharyngeal exudate.  Eyes: Conjunctivae and EOM are normal. Pupils are equal, round, and reactive to light. No scleral icterus.  Neck: Normal range of motion. Neck supple. No JVD present.  No thyromegaly present.  Cardiovascular: Normal rate and regular rhythm.  Exam reveals no friction rub.   No murmur heard. Pulmonary/Chest: Effort normal and breath sounds normal. No respiratory distress. She has no wheezes. She has no rales.  Abdominal: Soft. Bowel sounds are normal. She exhibits no distension. There is tenderness. There is no rebound and no guarding.  Tenderness mild,  in the left lower back and side. pain with palpation over the lateral and posterior lower left ribs, no palpable mass Musculoskeletal: Normal range of motion.  Lymphadenopathy:    She has no cervical adenopathy.  Neurological: She is alert and oriented to person, place, and time. No cranial nerve deficit. Coordination normal.  Skin: Skin is warm and dry.  Psychiatric: Mood, memory, affect and judgment normal.    LABORATORY DATA:  CBC    Component Value Date/Time   WBC 10.9* 08/04/2015 0948   RBC 4.64 08/04/2015 0948   RBC 4.29 07/27/2014 2257   HGB 11.4* 08/04/2015 0948   HCT 37.7 08/04/2015 0948   PLT 217 08/04/2015 0948   MCV 81.3 08/04/2015 0948   MCH 24.6* 08/04/2015 0948   MCHC 30.2 08/04/2015 0948   RDW 16.7* 08/04/2015 0948   LYMPHSABS 2.5 08/04/2015 0948   MONOABS 0.8 08/04/2015 0948   EOSABS 0.2 08/04/2015 0948   BASOSABS 0.0 08/04/2015 0948   CMP     Component Value Date/Time   NA 140 08/04/2015 1654   K 4.0 08/04/2015 1654   CL 105 08/04/2015 1654   CO2 27 08/04/2015 1654   GLUCOSE 121* 08/04/2015 1654   BUN 28* 08/04/2015 1654   CREATININE 0.97 08/04/2015 1654   CALCIUM 8.6* 08/04/2015 1654   PROT 6.7 08/04/2015 1654   ALBUMIN 3.6 08/04/2015 1654   AST 18 08/04/2015 1654   ALT 12* 08/04/2015 1654   ALKPHOS 68 08/04/2015 1654   BILITOT 0.3 08/04/2015 1654   GFRNONAA 55* 08/04/2015 1654   GFRAA >60 08/04/2015 1654   Results for Sarah, Duke (MRN [id number]) as of 08/09/2015 16:47  Ref. Range 03/09/2015 09:12 04/06/2015 13:30 06/06/2015 11:28 08/04/2015 09:48  CA 125 Latest Ref Range: 0.0-38.1 U/mL  23.9 21.7 23.6   ASSESSMENT and THERAPY PLAN:   Stage IV primary peritoneal carcinoma BRCA 2 positivity Chronic L side, Left lower back pain  We will continue with observation. CA 125 was normal. Clinically she is doing well. In regards to her side/back pain I have recommended CT of the abdomen. If normal, given her MRI results I will send her to pain clinic. Rib  series had a question of old fractures of the left fifth and sixth rib.I have refilled her pain medication.  Follow up in 2 months with repeat labs. I addressed her BRCA 2 positivity today and advised her that we need to discuss the potential impact on the remainder of her family. She is agreeable to genetics referral.  She is consistently hesitant with the referral but today does state she understands that it it necessary.  Repeat U/A today  All questions were answered. The patient knows to call the clinic with any problems, questions or concerns. We can certainly see the patient much sooner if necessary.   This note was signed electronically.  This document serves as a record of services personally performed by Ancil Linsey, MD. It was created on her behalf by Janace Hoard, a trained medical scribe. The creation of this record is based on the scribe's personal observations and the provider's statements to them. This  document has been checked and approved by the attending provider.  I have reviewed the above documentation for accuracy and completeness and I agree with the above.  Kelby Fam. Whitney Muse, MD

## 2015-08-08 NOTE — Patient Instructions (Signed)
[place] at [facility] Discharge Instructions  RECOMMENDATIONS MADE BY THE CONSULTANT AND ANY TEST RESULTS WILL BE SENT TO YOUR REFERRING PHYSICIAN.  Exam and discussion by Dr. Whitney Muse. Will check urine today Will do scans of your abdomen and pelvis Will make a referral for Genetic Counseling at [facility].  They will call you with the date and time of your appointment. Call with any concerns or issues.  Follow-up in 2 months. Port flushes as scheduled  Thank you for choosing [place] at [facility] to provide your oncology and hematology care.  To afford each patient quality time with our provider, please arrive at least 15 minutes before your scheduled appointment time.    You need to re-schedule your appointment should you arrive 10 or more minutes late.  We strive to give you quality time with our providers, and arriving late affects you and other patients whose appointments are after yours.  Also, if you no show three or more times for appointments you may be dismissed from the clinic at the providers discretion.     Again, thank you for choosing [facility].  Our hope is that these requests will decrease the amount of time that you wait before being seen by our physicians.       _____________________________________________________________  Should you have questions after your visit to [facility], please contact our office at [phone] between the hours of 8:30 a.m. and 4:30 p.m.  Voicemails left after 4:30 p.m. will not be returned until the following business day.  For prescription refill requests, have your pharmacy contact our office.

## 2015-08-11 ENCOUNTER — Telehealth: Payer: Self-pay | Admitting: Genetic Counselor

## 2015-08-11 NOTE — Telephone Encounter (Signed)
S/w patient friend Jeneen Rinks and gave genetic appt for 09/28 @ 10 w.Roma Kayser

## 2015-08-17 ENCOUNTER — Ambulatory Visit (HOSPITAL_COMMUNITY)
Admission: RE | Admit: 2015-08-17 | Discharge: 2015-08-17 | Disposition: A | Discharge status: Home / Self Care | Payer: Medicare HMO | Source: Ambulatory Visit | Attending: Hematology & Oncology | Admitting: Hematology & Oncology

## 2015-08-17 DIAGNOSIS — C482 Malignant neoplasm of peritoneum, unspecified: Secondary | ICD-10-CM | POA: Diagnosis not present

## 2015-08-17 DIAGNOSIS — R300 Dysuria: Secondary | ICD-10-CM | POA: Diagnosis not present

## 2015-08-17 DIAGNOSIS — R109 Unspecified abdominal pain: Secondary | ICD-10-CM | POA: Diagnosis present

## 2015-08-17 LAB — POCT I-STAT CREATININE: Creatinine, Ser: 1.2 mg/dL — ABNORMAL HIGH (ref 0.44–1.00)

## 2015-08-17 MED ORDER — IOHEXOL 300 MG/ML  SOLN
100.0000 mL | Freq: Once | INTRAMUSCULAR | Status: AC | PRN
  Administered 2015-08-17: 100 mL via INTRAVENOUS

## 2015-08-17 MED ORDER — IOHEXOL 300 MG/ML  SOLN
80.0000 mL | Freq: Once | INTRAMUSCULAR | Status: AC | PRN
  Administered 2015-08-17: 80 mL via INTRAVENOUS

## 2015-08-22 ENCOUNTER — Telehealth (HOSPITAL_COMMUNITY): Payer: Self-pay

## 2015-08-22 NOTE — Telephone Encounter (Signed)
Patient came by and wanted to know results of scan done on 08/17/15.  States "I'm still hurting in my left lower back.  The pain pills help some but doesn't completely relieve the discomfort."

## 2015-08-25 ENCOUNTER — Other Ambulatory Visit (HOSPITAL_COMMUNITY): Payer: Self-pay

## 2015-08-25 DIAGNOSIS — R319 Hematuria, unspecified: Secondary | ICD-10-CM

## 2015-08-25 NOTE — Telephone Encounter (Signed)
Come talk with me about patient's CT scans prior to calling her.

## 2015-08-25 NOTE — Telephone Encounter (Signed)
Per Dr. Whitney Muse, patient notified that recent CT scans did not reveal reason for back pain but urine did have some blood in it.  Options are to repeat urinalysis and/or referral to urology.  Prefers to come for urinalysis on Monday.  Appointment scheduled.

## 2015-08-28 ENCOUNTER — Encounter (HOSPITAL_COMMUNITY): Payer: Medicare HMO

## 2015-08-28 DIAGNOSIS — R319 Hematuria, unspecified: Secondary | ICD-10-CM

## 2015-08-28 DIAGNOSIS — C482 Malignant neoplasm of peritoneum, unspecified: Secondary | ICD-10-CM | POA: Diagnosis not present

## 2015-08-28 LAB — URINALYSIS, ROUTINE W REFLEX MICROSCOPIC
BILIRUBIN URINE: NEGATIVE
Glucose, UA: NEGATIVE mg/dL
Ketones, ur: NEGATIVE mg/dL
NITRITE: NEGATIVE
Protein, ur: NEGATIVE mg/dL
SPECIFIC GRAVITY, URINE: 1.02 (ref 1.005–1.030)
Urobilinogen, UA: 0.2 mg/dL (ref 0.0–1.0)
pH: 5.5 (ref 5.0–8.0)

## 2015-08-28 LAB — URINE MICROSCOPIC-ADD ON

## 2015-08-28 NOTE — Progress Notes (Signed)
UA done

## 2015-08-30 ENCOUNTER — Ambulatory Visit (HOSPITAL_BASED_OUTPATIENT_CLINIC_OR_DEPARTMENT_OTHER): Payer: Medicare HMO | Admitting: Genetic Counselor

## 2015-08-30 ENCOUNTER — Other Ambulatory Visit: Payer: Medicare HMO

## 2015-08-30 ENCOUNTER — Encounter: Payer: Self-pay | Admitting: Genetic Counselor

## 2015-08-30 DIAGNOSIS — C569 Malignant neoplasm of unspecified ovary: Secondary | ICD-10-CM | POA: Diagnosis not present

## 2015-08-30 DIAGNOSIS — Z315 Encounter for genetic counseling: Secondary | ICD-10-CM

## 2015-08-30 NOTE — Progress Notes (Signed)
REFERRING PROVIDER: Sinda Du, MD [address] [place], [place] [address]   Ancil Linsey, MD  PRIMARY PROVIDER:  [name], MD  PRIMARY REASON FOR VISIT:  1. Ovarian cancer, unspecified laterality      HISTORY OF PRESENT ILLNESS:   [name], a 80 y.o. female, was seen for a [place] cancer genetics consultation at the request of Dr. Whitney Muse due to a personal history of cancer.  [name] presents to clinic today to discuss the possibility of a hereditary predisposition to cancer, genetic testing, and to further clarify her future cancer risks, as well as potential cancer risks for family members.   In 2015, at the age of 66, [name] was diagnosed with ovarian cancer. This was treated with surgery and chemotherapy. Foundation one testing was performed on her tumor and was found to have a BRCA1, STK11 and TP53 mutation.  The patient's granddaughter had genetic testing based on her diagnosis of ovarian cancer and BRCA2 mutation and was negative for a BRCA2 mutation but did have a VUS in an unknown gene.     CANCER HISTORY:    Ovarian cancer   07/25/2014 Pathology Results Left pleural effusion positive for malignant cell consistent with adenocarcinoma.   07/27/2014 - 08/05/2014 Hospital Admission Heart failure with malignant pleural effusion   07/28/2014 Pathology Results Left pleural effusion positive for malignant cells consistent with adenocarcinoma (CK7 pos, CK20 neg, WT-1 pos, ER weakly pos, PR neg, TTF-1 neg, GCDFP-15 neg).  Findings favor gyn/ovarian primary.   08/01/2014 Pathology Results [facility]- Postive genomic findings: BRCA2 R2382f*31, STK11 loss, MYC amplification-equivocal, TP53 R273C, MLL2 QK5625.  Targeted treatments include Olaparib (BRCA2) and Everolimus/Temsirolimus (STK11).   08/03/2014 Tumor Marker CA 125 4954   08/04/2014 Procedure Left pleurX catheter by Dr. VLucianne LeiTright   09/08/2014 - 09/14/2014 Hospital Admission Hospitalized with the  following main issues: acute on chronic CHF, acute on chronic respiratory failure with hypoxia, and malignant plueral effusion on right    09/13/2014 Procedure Right PleurX catheter by Dr. BCyndia Bent  09/13/2014 Pathology Results Right pleural effusion positive for malignant adenocarcinoma   09/21/2014 Tumor Marker CA 125 3869   09/22/2014 - 11/17/2014 Chemotherapy Carboplatin/Paclitaxel days 1, 8, 15 every 28 days.   10/20/2014 Tumor Marker CA 125 335   11/23/2014 -  Chemotherapy Carboplatin/Abraxane day 1, 8, 15 every 28 days.     HORMONAL RISK FACTORS:  Menarche was at age 80  First live birth at age 80  OCP use for approximately 0 years.  Ovaries intact: no.  Hysterectomy: yes.  Menopausal status: postmenopausal.  HRT use: 0 years. Colonoscopy: yes; 2 TAs found on last colonoscopy.  On a 5 year schedule.. Mammogram within the last year: yes. Number of breast biopsies: 0. Up to date with pelvic exams:  yes. Any excessive radiation exposure in the past:  no  Past Medical History  Diagnosis Date  . GERD (gastroesophageal reflux disease)   . Osteoarthritis   . Peripheral edema   . CHF (congestive heart failure), NYHA class IV     diastolic  . Cancer     Past Surgical History  Procedure Laterality Date  . Cholecystectomy    . Cardiac catheterization    . Total abdominal hysterectomy  40 years ago    patient does not know if ovaries were removed  . Appendectomy    . Total knee arthroplasty Left   . Chest tube insertion Left 08/04/2014    Procedure: INSERTION PLEURAL DRAINAGE CATHETER;  Surgeon: Ivin Poot, MD;  Location: [place];  Service: Thoracic;  Laterality: Left;  . Chest tube insertion Right 09/13/2014    Procedure: INSERTION PLEURAL DRAINAGE CATHETER;  Surgeon: Gaye Pollack, MD;  Location: [place] [facility];  Service: Thoracic;  Laterality: Right;  . Removal of pleural drainage catheter Bilateral 12/06/2014    Procedure: REMOVAL OF PLEURAL DRAINAGE CATHETER;  Surgeon: Ivin Poot, MD;  Location: [facility];  Service: Thoracic;  Laterality: Bilateral;    Social History   Social History  . Marital Status: Widowed    Spouse Name: N/A  . Number of Children: 4  . Years of Education: N/A   Occupational History  . nurses aid    Social History Main Topics  . Smoking status: Never Smoker   . Smokeless tobacco: None  . Alcohol Use: No  . Drug Use: No  . Sexual Activity: Not Asked   Other Topics Concern  . None   Social History Narrative     FAMILY HISTORY:  We obtained a detailed, 4-generation family history.  Significant diagnoses are listed below: Family History  Problem Relation Age of Onset  . Heart attack Father   . Cancer Paternal Grandmother     "stomach cancer"  . Cancer Cousin     possibly ovarian cancer   The patient had four children, one who was killed by a car at age 70.  She had three brothers and one sister, two brothers have died in accidents.  Her mother died at 18 from unknown reasons. She had two sisters who died of heart disease.  Her maternal grandparents died of old age.  Her father died of a heart attack at 29.  He had three sisters and two brothers.  One brother had a daughter who died of possibly ovarian cancer.  Her paternal grandmother had possible stomach cancer.  Patient's maternal ancestors are of Caucasian descent, and paternal ancestors are of [place] descent. There is no reported Ashkenazi Jewish ancestry. There is no known consanguinity.  GENETIC COUNSELING ASSESSMENT: [name] is a 80 y.o. female with a personal history of ovarian cancer and a foundation one BRCA2 mutation which somewhat suggestive of a hereditary cancer syndrome and predisposition to cancer. We, therefore, discussed and recommended the following at today's visit.   DISCUSSION: We discussed that foundation one testing is tumor testing. Therefore the mutations found in this test may or may not be germline mutations.  Foundation one found mutations in  three genes that are involved in hereditary cancer syndromes - BRCA2, STK11 and TP53.  We discussed that the cancer pattern in her family is not consistent with any of these genes, but less so of TP53 and STK11.   We reviewed the characteristics, features and inheritance patterns of hereditary cancer syndromes. We also discussed genetic testing, including the appropriate family members to test, the process of testing, insurance coverage and turn-around-time for results. We discussed the implications of a negative, positive and/or variant of uncertain significant result. In order to get genetic test results in a timely manner and test for the most relevant genes first, we recommended [name] pursue genetic testing for the BRCA1/2 and del/dup test. If this test is negative, we then recommend [name] pursue reflex genetic testing to the OvaNext gene panel. The OvaNext gene panel offered by [facility] and includes sequencing and rearrangement analysis for the following 24 genes: ATM, BARD1, BRCA1, BRCA2, BRIP1, CDH1, CHEK2, EPCAM, MLH1, MRE11A, MSH2, MSH6, MUTYH, NBN,  NF1, PALB2, PMS2, PTEN, RAD50, RAD51C, RAD51D, SMARCA4, STK11, and TP53.   Based on [name] personal history of cancer, she meets medical criteria for genetic testing. Despite that she meets criteria, she may still have an out of pocket cost. We discussed that if her out of pocket cost for testing is over $100, the laboratory will call and confirm whether she wants to proceed with testing.  If the out of pocket cost of testing is less than $100 she will be billed by the genetic testing laboratory.   PLAN: After considering the risks, benefits, and limitations, [name]  provided informed consent to pursue genetic testing and the blood sample was sent to [facility] for analysis of the [place]. Results for BRCA1 and 2 should be available in about 10 days to 2 weeks, and if we proceed to [place] it should be available within approximately  2-3 weeks' time after that, at which point they will be disclosed by telephone to [name], as will any additional recommendations warranted by these results. [name] will receive a summary of her genetic counseling visit and a copy of her results once available. This information will also be available in Epic. We encouraged [name] to remain in contact with cancer genetics annually so that we can continuously update the family history and inform her of any changes in cancer genetics and testing that may be of benefit for her family. [name] questions were answered to her satisfaction today. Our contact information was provided should additional questions or concerns arise.  Lastly, we encouraged [name] to remain in contact with cancer genetics annually so that we can continuously update the family history and inform her of any changes in cancer genetics and testing that may be of benefit for this family.   Ms.  Duke questions were answered to her satisfaction today. Our contact information was provided should additional questions or concerns arise. Thank you for the referral and allowing [place] to share in the care of your patient.   [name], [place], [facility] Certified Genetic Counselor [email or web address] phone: [phone]  The patient was seen for a total of 45 minutes in face-to-face genetic counseling.  This patient was discussed with Drs. Magrinat, Lindi Adie and/or Burr Medico who agrees with the above.    _______________________________________________________________________ For Office Staff:  Number of people involved in session: 4 Was an Intern/ student involved with case: no

## 2015-09-07 ENCOUNTER — Encounter (HOSPITAL_COMMUNITY): Payer: Self-pay

## 2015-09-07 ENCOUNTER — Encounter (HOSPITAL_COMMUNITY): Payer: Medicare HMO | Attending: Hematology & Oncology

## 2015-09-07 VITALS — BP 129/65 | HR 57 | Temp 97.7°F | Resp 16

## 2015-09-07 DIAGNOSIS — Z9889 Other specified postprocedural states: Secondary | ICD-10-CM | POA: Insufficient documentation

## 2015-09-07 DIAGNOSIS — Z452 Encounter for adjustment and management of vascular access device: Secondary | ICD-10-CM

## 2015-09-07 DIAGNOSIS — Z95828 Presence of other vascular implants and grafts: Secondary | ICD-10-CM

## 2015-09-07 DIAGNOSIS — C482 Malignant neoplasm of peritoneum, unspecified: Secondary | ICD-10-CM

## 2015-09-07 LAB — URINALYSIS, ROUTINE W REFLEX MICROSCOPIC
Bilirubin Urine: NEGATIVE
GLUCOSE, UA: NEGATIVE mg/dL
Ketones, ur: NEGATIVE mg/dL
Nitrite: POSITIVE — AB
Protein, ur: NEGATIVE mg/dL
Urobilinogen, UA: 0.2 mg/dL (ref 0.0–1.0)
pH: 5.5 (ref 5.0–8.0)

## 2015-09-07 LAB — URINE MICROSCOPIC-ADD ON

## 2015-09-07 MED ORDER — HEPARIN SOD (PORK) LOCK FLUSH 100 UNIT/ML IV SOLN
500.0000 [IU] | Freq: Once | INTRAVENOUS | Status: AC
  Administered 2015-09-07: 500 [IU] via INTRAVENOUS
  Filled 2015-09-07: qty 5

## 2015-09-07 MED ORDER — SODIUM CHLORIDE 0.9 % IJ SOLN
10.0000 mL | INTRAMUSCULAR | Status: DC | PRN
  Administered 2015-09-07: 10 mL via INTRAVENOUS
  Filled 2015-09-07: qty 10

## 2015-09-07 NOTE — Progress Notes (Signed)
[name] presented for Portacath access and flush.  Proper placement of portacath confirmed by CXR.  Portacath located right chest wall accessed with  H 20 needle.  Good blood return present. Portacath flushed with 41ml NS and 500U/52ml Heparin and needle removed intact.  Procedure tolerated well and without incident.     Pt c/o of burning when urinating.  Kirby Crigler notified and orders received to collect urine specimen.

## 2015-09-07 NOTE — Patient Instructions (Signed)
[facility] Discharge Instructions  RECOMMENDATIONS MADE BY THE CONSULTANT AND ANY TEST RESULTS WILL BE SENT TO YOUR REFERRING PHYSICIAN.  Port flush today Follow up as scheduled Please call the clinic if you have any questions or concerns  Thank you for choosing [facility] at [facility] to provide your oncology and hematology care.  To afford each patient quality time with our provider, please arrive at least 15 minutes before your scheduled appointment time.    You need to re-schedule your appointment should you arrive 10 or more minutes late.  We strive to give you quality time with our providers, and arriving late affects you and other patients whose appointments are after yours.  Also, if you no show three or more times for appointments you may be dismissed from the clinic at the providers discretion.     Again, thank you for choosing [facility].  Our hope is that these requests will decrease the amount of time that you wait before being seen by our physicians.       _____________________________________________________________  Should you have questions after your visit to  Cancer Center, please contact our office at [phone] between the hours of 8:30 a.m. and 4:30 p.m.  Voicemails left after 4:30 p.m. will not be returned until the following business day.  For prescription refill requests, have your pharmacy contact our office.    

## 2015-09-08 ENCOUNTER — Ambulatory Visit: Payer: Self-pay | Admitting: Genetic Counselor

## 2015-09-08 ENCOUNTER — Encounter: Payer: Self-pay | Admitting: Genetic Counselor

## 2015-09-08 ENCOUNTER — Other Ambulatory Visit (HOSPITAL_COMMUNITY): Payer: Self-pay | Admitting: Oncology

## 2015-09-08 ENCOUNTER — Telehealth: Payer: Self-pay | Admitting: Genetic Counselor

## 2015-09-08 DIAGNOSIS — Z1379 Encounter for other screening for genetic and chromosomal anomalies: Secondary | ICD-10-CM

## 2015-09-08 DIAGNOSIS — N390 Urinary tract infection, site not specified: Secondary | ICD-10-CM

## 2015-09-08 DIAGNOSIS — C569 Malignant neoplasm of unspecified ovary: Secondary | ICD-10-CM

## 2015-09-08 MED ORDER — CIPROFLOXACIN HCL 500 MG PO TABS: 500.0000 mg | ORAL_TABLET | Freq: Two times a day (BID) | ORAL | Status: DC

## 2015-09-08 NOTE — Progress Notes (Signed)
HPI: [name] was previously seen in the [place] clinic due to a personal history of ovarian cancer and concerns regarding a hereditary predisposition to cancer. Please refer to our prior cancer genetics clinic note for more information regarding [name]'s medical, social and family histories, and our assessment and recommendations, at the time. [name] recent genetic test results were disclosed to her, as were recommendations warranted by these results. These results and recommendations are discussed in more detail below.  FAMILY HISTORY:  We obtained a detailed, 4-generation family history.  Significant diagnoses are listed below: Family History  Problem Relation Age of Onset  . Heart attack Father   . Cancer Paternal Grandmother     "stomach cancer"  . Cancer Cousin     possibly ovarian cancer    The patient had four children, one who was killed by a car at age 2. She had three brothers and one sister, two brothers have died in accidents. Her mother died at 44 from unknown reasons. She had two sisters who died of heart disease. Her maternal grandparents died of old age. Her father died of a heart attack at 37. He had three sisters and two brothers. One brother had a daughter who died of possibly ovarian cancer. Her paternal grandmother had possible stomach cancer. Patient's maternal ancestors are of Caucasian descent, and paternal ancestors are of [place] descent. There is no reported Ashkenazi Jewish ancestry. There is no known consanguinity.  GENETIC TEST RESULTS: At the time of [name] visit, we recommended she pursue genetic testing of the BRCA1 and BRCA2 gene panel.  The report date is September 07, 2015.  Genetic testing was normal, and did not reveal a deleterious mutation in these genes. The OvaNext panel test is currently pending.  The test report has been scanned into EPIC and is located under the Molecular Pathology section of the Results Review tab.    We discussed with [name] that since the current genetic testing is not perfect, it is possible there may be a gene mutation in one of these genes that current testing cannot detect, but that chance is small. We also discussed, that it is possible that another gene that has not yet been discovered, or that we have not yet tested, is responsible for the cancer diagnoses in the family, and it is, therefore, important to remain in touch with cancer genetics in the future so that we can continue to offer [name] the most up to date genetic testing.   ADDITIONAL GENETIC TESTING: The OvaNext panel testing is pending.  We will contact [name] and do a final risk assessment, when these results are available.  Our contact number was provided. [name] questions were answered to her satisfaction, and she knows she is welcome to call us at anytime with additional questions or concerns.   Roma Kayser, MS, [facility] Certified Genetic Counselor [email or web address]

## 2015-09-08 NOTE — Telephone Encounter (Signed)
Negative genetic testing on the BRCA1 and BRCA2 testing.  Ovanext panel testing is pending.

## 2015-09-28 ENCOUNTER — Other Ambulatory Visit (HOSPITAL_COMMUNITY): Payer: Self-pay | Admitting: Pulmonary Disease

## 2015-09-28 ENCOUNTER — Ambulatory Visit (HOSPITAL_COMMUNITY)
Admission: RE | Admit: 2015-09-28 | Discharge: 2015-09-28 | Disposition: A | Discharge status: Home / Self Care | Payer: Medicare HMO | Source: Ambulatory Visit | Attending: Pulmonary Disease | Admitting: Pulmonary Disease

## 2015-09-28 DIAGNOSIS — M545 Low back pain: Secondary | ICD-10-CM

## 2015-09-28 DIAGNOSIS — M8580 Other specified disorders of bone density and structure, unspecified site: Secondary | ICD-10-CM | POA: Insufficient documentation

## 2015-09-28 DIAGNOSIS — M8938 Hypertrophy of bone, other site: Secondary | ICD-10-CM | POA: Diagnosis not present

## 2015-09-30 ENCOUNTER — Encounter (HOSPITAL_COMMUNITY): Payer: Self-pay

## 2015-09-30 ENCOUNTER — Emergency Department (HOSPITAL_COMMUNITY)
Admission: EM | Admit: 2015-09-30 | Discharge: 2015-09-30 | Disposition: A | Discharge status: Home / Self Care | Payer: Medicare HMO | Attending: Emergency Medicine | Admitting: Emergency Medicine

## 2015-09-30 DIAGNOSIS — Z9889 Other specified postprocedural states: Secondary | ICD-10-CM | POA: Diagnosis not present

## 2015-09-30 DIAGNOSIS — Z7952 Long term (current) use of systemic steroids: Secondary | ICD-10-CM | POA: Diagnosis not present

## 2015-09-30 DIAGNOSIS — M549 Dorsalgia, unspecified: Secondary | ICD-10-CM | POA: Insufficient documentation

## 2015-09-30 DIAGNOSIS — M1990 Unspecified osteoarthritis, unspecified site: Secondary | ICD-10-CM | POA: Diagnosis not present

## 2015-09-30 DIAGNOSIS — I509 Heart failure, unspecified: Secondary | ICD-10-CM | POA: Diagnosis not present

## 2015-09-30 DIAGNOSIS — N390 Urinary tract infection, site not specified: Secondary | ICD-10-CM | POA: Insufficient documentation

## 2015-09-30 DIAGNOSIS — K219 Gastro-esophageal reflux disease without esophagitis: Secondary | ICD-10-CM | POA: Diagnosis not present

## 2015-09-30 DIAGNOSIS — E876 Hypokalemia: Secondary | ICD-10-CM | POA: Diagnosis not present

## 2015-09-30 DIAGNOSIS — M79609 Pain in unspecified limb: Secondary | ICD-10-CM | POA: Diagnosis not present

## 2015-09-30 DIAGNOSIS — R05 Cough: Secondary | ICD-10-CM | POA: Insufficient documentation

## 2015-09-30 DIAGNOSIS — J029 Acute pharyngitis, unspecified: Secondary | ICD-10-CM | POA: Insufficient documentation

## 2015-09-30 DIAGNOSIS — H9209 Otalgia, unspecified ear: Secondary | ICD-10-CM | POA: Insufficient documentation

## 2015-09-30 DIAGNOSIS — Z79899 Other long term (current) drug therapy: Secondary | ICD-10-CM | POA: Diagnosis not present

## 2015-09-30 DIAGNOSIS — R51 Headache: Secondary | ICD-10-CM | POA: Diagnosis not present

## 2015-09-30 DIAGNOSIS — Z880 Allergy status to penicillin: Secondary | ICD-10-CM | POA: Insufficient documentation

## 2015-09-30 DIAGNOSIS — Z8543 Personal history of malignant neoplasm of ovary: Secondary | ICD-10-CM | POA: Insufficient documentation

## 2015-09-30 DIAGNOSIS — R109 Unspecified abdominal pain: Secondary | ICD-10-CM | POA: Diagnosis not present

## 2015-09-30 DIAGNOSIS — R509 Fever, unspecified: Secondary | ICD-10-CM | POA: Diagnosis present

## 2015-09-30 LAB — CBC WITH DIFFERENTIAL/PLATELET
BASOS ABS: 0 10*3/uL (ref 0.0–0.1)
BASOS PCT: 0 %
EOS ABS: 0.1 10*3/uL (ref 0.0–0.7)
Eosinophils Relative: 1 %
HEMATOCRIT: 35.2 % — AB (ref 36.0–46.0)
HEMOGLOBIN: 10.8 g/dL — AB (ref 12.0–15.0)
Lymphocytes Relative: 19 %
Lymphs Abs: 2 10*3/uL (ref 0.7–4.0)
MCH: 24.9 pg — ABNORMAL LOW (ref 26.0–34.0)
MCHC: 30.7 g/dL (ref 30.0–36.0)
MCV: 81.1 fL (ref 78.0–100.0)
MONOS PCT: 11 %
Monocytes Absolute: 1.1 10*3/uL — ABNORMAL HIGH (ref 0.1–1.0)
NEUTROS ABS: 7.3 10*3/uL (ref 1.7–7.7)
NEUTROS PCT: 69 %
Platelets: 213 10*3/uL (ref 150–400)
RBC: 4.34 MIL/uL (ref 3.87–5.11)
RDW: 16.3 % — ABNORMAL HIGH (ref 11.5–15.5)
WBC: 10.6 10*3/uL — AB (ref 4.0–10.5)

## 2015-09-30 LAB — URINALYSIS, ROUTINE W REFLEX MICROSCOPIC
BILIRUBIN URINE: NEGATIVE
Glucose, UA: NEGATIVE mg/dL
KETONES UR: NEGATIVE mg/dL
LEUKOCYTES UA: NEGATIVE
NITRITE: POSITIVE — AB
PH: 5.5 (ref 5.0–8.0)
Protein, ur: NEGATIVE mg/dL
Specific Gravity, Urine: >1.03 — ABNORMAL HIGH (ref 1.005–1.030)
UROBILINOGEN UA: 0.2 mg/dL (ref 0.0–1.0)

## 2015-09-30 LAB — COMPREHENSIVE METABOLIC PANEL
ALBUMIN: 3.7 g/dL (ref 3.5–5.0)
ALT: 14 U/L (ref 14–54)
AST: 20 U/L (ref 15–41)
Alkaline Phosphatase: 65 U/L (ref 38–126)
Anion gap: 9 (ref 5–15)
BUN: 27 mg/dL — AB (ref 6–20)
CHLORIDE: 102 mmol/L (ref 101–111)
CO2: 30 mmol/L (ref 22–32)
Calcium: 9 mg/dL (ref 8.9–10.3)
Creatinine, Ser: 1.21 mg/dL — ABNORMAL HIGH (ref 0.44–1.00)
GFR, EST AFRICAN AMERICAN: 48 mL/min — AB (ref >60)
GFR, EST NON AFRICAN AMERICAN: 42 mL/min — AB (ref >60)
Glucose, Bld: 133 mg/dL — ABNORMAL HIGH (ref 65–99)
POTASSIUM: 2.9 mmol/L — AB (ref 3.5–5.1)
SODIUM: 141 mmol/L (ref 135–145)
Total Bilirubin: 0.7 mg/dL (ref 0.3–1.2)
Total Protein: 6.7 g/dL (ref 6.5–8.1)

## 2015-09-30 LAB — URINE MICROSCOPIC-ADD ON

## 2015-09-30 MED ORDER — CEPHALEXIN 500 MG PO CAPS: 500.0000 mg | ORAL_CAPSULE | Freq: Four times a day (QID) | ORAL | Status: DC

## 2015-09-30 MED ORDER — POTASSIUM CHLORIDE CRYS ER 20 MEQ PO TBCR
40.0000 meq | EXTENDED_RELEASE_TABLET | Freq: Once | ORAL | Status: AC
  Administered 2015-09-30: 40 meq via ORAL
  Filled 2015-09-30: qty 2

## 2015-09-30 MED ORDER — POTASSIUM CHLORIDE ER 20 MEQ PO TBCR: 20.0000 meq | EXTENDED_RELEASE_TABLET | Freq: Every day | ORAL | Status: DC

## 2015-09-30 MED ORDER — CEPHALEXIN 500 MG PO CAPS
500.0000 mg | ORAL_CAPSULE | Freq: Once | ORAL | Status: AC
  Administered 2015-09-30: 500 mg via ORAL
  Filled 2015-09-30: qty 1

## 2015-09-30 NOTE — Discharge Instructions (Signed)
Continue taking all of your usual medications. Use the new prescriptions as prescribed beginning tomorrow morning. Discuss your condition with your doctor and see if he recommends that you get some more help at home.   Hypokalemia Hypokalemia means that the amount of potassium in the blood is lower than normal.Potassium is a chemical, called an electrolyte, that helps regulate the amount of fluid in the body. It also stimulates muscle contraction and helps nerves function properly.Most of the body's potassium is inside of cells, and only a very small amount is in the blood. Because the amount in the blood is so small, minor changes can be life-threatening. CAUSES  Antibiotics.  Diarrhea or vomiting.  Using laxatives too much, which can cause diarrhea.  Chronic kidney disease.  Water pills (diuretics).  Eating disorders (bulimia).  Low magnesium level.  Sweating a lot. SIGNS AND SYMPTOMS  Weakness.  Constipation.  Fatigue.  Muscle cramps.  Mental confusion.  Skipped heartbeats or irregular heartbeat (palpitations).  Tingling or numbness. DIAGNOSIS  Your health care provider can diagnose hypokalemia with blood tests. In addition to checking your potassium level, your health care provider may also check other lab tests. TREATMENT Hypokalemia can be treated with potassium supplements taken by mouth or adjustments in your current medicines. If your potassium level is very low, you may need to get potassium through a vein (IV) and be monitored in the hospital. A diet high in potassium is also helpful. Foods high in potassium are:  Nuts, such as peanuts and pistachios.  Seeds, such as sunflower seeds and pumpkin seeds.  Peas, lentils, and lima beans.  Whole grain and bran cereals and breads.  Fresh fruit and vegetables, such as apricots, avocado, bananas, cantaloupe, kiwi, oranges, tomatoes, asparagus, and potatoes.  Orange and tomato juices.  Red meats.  Fruit  yogurt. HOME CARE INSTRUCTIONS  Take all medicines as prescribed by your health care provider.  Maintain a healthy diet by including nutritious food, such as fruits, vegetables, nuts, whole grains, and lean meats.  If you are taking a laxative, be sure to follow the directions on the label. SEEK MEDICAL CARE IF:  Your weakness gets worse.  You feel your heart pounding or racing.  You are vomiting or having diarrhea.  You are diabetic and having trouble keeping your blood glucose in the normal range. SEEK IMMEDIATE MEDICAL CARE IF:  You have chest pain, shortness of breath, or dizziness.  You are vomiting or having diarrhea for more than 2 days.  You faint. MAKE SURE YOU:   Understand these instructions.  Will watch your condition.  Will get help right away if you are not doing well or get worse.   This information is not intended to replace advice given to you by your health care provider. Make sure you discuss any questions you have with your health care provider.   Document Released: 11/18/2005 Document Revised: 12/09/2014 Document Reviewed: 05/21/2013 Elsevier Interactive Patient Education 2016 Elsevier Inc.  Urinary Tract Infection A urinary tract infection (UTI) can occur any place along the urinary tract. The tract includes the kidneys, ureters, bladder, and urethra. A type of germ called bacteria often causes a UTI. UTIs are often helped with antibiotic medicine.  HOME CARE   If given, take antibiotics as told by your doctor. Finish them even if you start to feel better.  Drink enough fluids to keep your pee (urine) clear or pale yellow.  Avoid tea, drinks with caffeine, and bubbly (carbonated) drinks.  Pee often.  Avoid holding your pee in for a long time.  Pee before and after having sex (intercourse).  Wipe from front to back after you poop (bowel movement) if you are a woman. Use each tissue only once. GET HELP RIGHT AWAY IF:   You have back  pain.  You have lower belly (abdominal) pain.  You have chills.  You feel sick to your stomach (nauseous).  You throw up (vomit).  Your burning or discomfort with peeing does not go away.  You have a fever.  Your symptoms are not better in 3 days. MAKE SURE YOU:   Understand these instructions.  Will watch your condition.  Will get help right away if you are not doing well or get worse.   This information is not intended to replace advice given to you by your health care provider. Make sure you discuss any questions you have with your health care provider.   Document Released: 05/06/2008 Document Revised: 12/09/2014 Document Reviewed: 06/18/2012 Elsevier Interactive Patient Education [employer].

## 2015-09-30 NOTE — ED Provider Notes (Signed)
CSN: [id number]     Arrival date & time 09/30/15  1934 History  By signing my name below, I, Stephania Fragmin, attest that this documentation has been prepared under the direction and in the presence of Daleen Bo, MD. Electronically Signed: Stephania Fragmin, ED Scribe. 09/30/2015. 10:41 PM.     Chief Complaint  Patient presents with  . Altered Mental Status   The history is provided by the spouse. No language interpreter was used.   HPI Comments: [name] is a 80 y.o. female with ovarian cancer with upcoming chemotherapy planned for February 2017, who presents to the Emergency Department complaining of confusion that her son reports she seems to have begun several months ago and acutely seemed to worsen today. Her son states she has been repeatedly asking for many family members who have already passed away. Her son is also concerned that she is not taking her medications, including oxycodone prescribed by Dr. Luan Pulling and cancer medications prescribed by Dr. Whitney Muse, correctly. He states she normally lives at home alone and has taken her medications herself for the past 6 months, although he comes over once a week and sets her medications in a pill organizer for her. Her granddaughter brought her here today. Patient's son he is unsure whether she has received any prior medical evaluation for this. He states her boyfriend normally cares for her and brings her to PCP Dr. Luan Pulling as needed.    Patient complains of a headache, which she attributes this to sinus symptoms. She also endorses fever, ear pain, sore throat, and cough that began 1 week ago. She endorses pain everywhere, including extremity pain, back pain, and abdominal pain, stating, "It just hurts everywhere." Her son also notes she has been coughing somewhat. He notes she normally ambulates well with a cane.   Past Medical History  Diagnosis Date  . GERD (gastroesophageal reflux disease)   . Osteoarthritis   . Peripheral edema   . CHF  (congestive heart failure), NYHA class IV (HCC)     diastolic  . Cancer [facility])    Past Surgical History  Procedure Laterality Date  . Cholecystectomy    . Cardiac catheterization    . Total abdominal hysterectomy  40 years ago    patient does not know if ovaries were removed  . Appendectomy    . Total knee arthroplasty Left   . Chest tube insertion Left 08/04/2014    Procedure: INSERTION PLEURAL DRAINAGE CATHETER;  Surgeon: Ivin Poot, MD;  Location: [facility];  Service: Thoracic;  Laterality: Left;  . Chest tube insertion Right 09/13/2014    Procedure: INSERTION PLEURAL DRAINAGE CATHETER;  Surgeon: Gaye Pollack, MD;  Location: [facility] OR;  Service: Thoracic;  Laterality: Right;  . Removal of pleural drainage catheter Bilateral 12/06/2014    Procedure: REMOVAL OF PLEURAL DRAINAGE CATHETER;  Surgeon: Ivin Poot, MD;  Location: [facility] OR;  Service: Thoracic;  Laterality: Bilateral;   Family History  Problem Relation Age of Onset  . Heart attack Father   . Cancer Paternal Grandmother     "stomach cancer"  . Cancer Cousin     possibly ovarian cancer   Social History  Substance Use Topics  . Smoking status: Never Smoker   . Smokeless tobacco: None  . Alcohol Use: No   OB History    No data available     Review of Systems  All other systems reviewed and are negative.  Allergies  Penicillins  Home Medications  Prior to Admission medications   Medication Sig Start Date End Date Taking? Authorizing Provider  docusate sodium (COLACE) 100 MG capsule Take 100 mg by mouth 2 (two) times daily.   Yes Historical Provider, MD  furosemide (LASIX) 20 MG tablet Take 20 mg by mouth daily.   Yes Historical Provider, MD  KLOR-CON M10 10 MEQ tablet Take 20 mEq by mouth 3 (three) times daily.  07/28/15  Yes Historical Provider, MD  lactulose (CHRONULAC) 10 GM/15ML solution Take 30 mLs (20 g total) by mouth 2 (two) times daily as needed for moderate constipation. 09/14/14  Yes Kelvin Cellar, MD   metoCLOPramide (REGLAN) 5 MG tablet Take 5 mg by mouth 4 (four) times daily.   Yes Historical Provider, MD  mometasone (ELOCON) 0.1 % cream Apply 1 application topically daily. To affected areas (arms and hands)   Yes Historical Provider, MD  Nutritional Supplements (ENSURE CLEAR) LIQD Take 1 Bottle by mouth 3 (three) times daily.   Yes Historical Provider, MD  oxyCODONE (ROXICODONE) 5 MG immediate release tablet Take two tablet by mouth every 4 hours as needed for pain Patient taking differently: Take 10 mg by mouth every 4 (four) hours as needed for moderate pain or severe pain.  08/08/15  Yes Patrici Ranks, MD  pantoprazole (PROTONIX) 40 MG tablet Take 1 tablet (40 mg total) by mouth daily. 08/12/14  Yes Milton Ferguson, MD  predniSONE (DELTASONE) 10 MG tablet Take 1 tablet (10 mg total) by mouth daily with breakfast. 10/20/14  Yes Baird Cancer, PA-C  cephALEXin (KEFLEX) 500 MG capsule Take 1 capsule (500 mg total) by mouth 4 (four) times daily. 09/30/15   Daleen Bo, MD  ciprofloxacin (CIPRO) 500 MG tablet Take 1 tablet (500 mg total) by mouth 2 (two) times daily. Patient not taking: Reported on 09/30/2015 09/08/15   Manon Hilding Kefalas, PA-C  potassium chloride 20 MEQ TBCR Take 20 mEq by mouth daily. 09/30/15   Daleen Bo, MD   BP 125/71 mmHg  Pulse 81  Temp(Src) 98 F (36.7 C) (Oral)  Resp 18  Ht 5\' 5"  (1.651 m)  Wt 155 lb (70.308 kg)  BMI 25.79 kg/m2  SpO2 100% Physical Exam  Constitutional: She is oriented to person, place, and time. She appears well-developed and well-nourished.  HENT:  Head: Normocephalic and atraumatic.  Mouth/Throat: Oropharynx is clear and moist. No oropharyngeal exudate.  Eyes: Conjunctivae and EOM are normal. Pupils are equal, round, and reactive to light.  Neck: Normal range of motion and phonation normal. Neck supple.  Cardiovascular: Normal rate and regular rhythm.   Pulmonary/Chest: Effort normal and breath sounds normal. No respiratory  distress. She has no wheezes. She has no rales. She exhibits no tenderness.  Abdominal: Soft. She exhibits no distension. There is no tenderness. There is no guarding.  Musculoskeletal: Normal range of motion.  Neurological: She is alert and oriented to person, place, and time. She exhibits normal muscle tone.  Skin: Skin is warm and dry.  Psychiatric: She has a normal mood and affect. Her behavior is normal. Judgment and thought content normal.  Nursing note and vitals reviewed.   ED Course  Procedures (including critical care time)  DIAGNOSTIC STUDIES: Oxygen Saturation is 97% on RA, normal by my interpretation.    COORDINATION OF CARE: 9:39 PM - Discussed treatment plan with pt at bedside which includes lab tests. Pt verbalized understanding and agreed to plan.   10:38 PM - Pt's son made aware of lab results, including abnormal  potassium and UA results. Will treat for UTI. Pt's son verbalized understanding and agreed to plan.    Labs Review Labs Reviewed  URINALYSIS, ROUTINE W REFLEX MICROSCOPIC (NOT AT [facility]) - Abnormal; Notable for the following:    APPearance CLOUDY (*)    Specific Gravity, Urine >1.030 (*)    Hgb urine dipstick MODERATE (*)    Nitrite POSITIVE (*)    All other components within normal limits  CBC WITH DIFFERENTIAL/PLATELET - Abnormal; Notable for the following:    WBC 10.6 (*)    Hemoglobin 10.8 (*)    HCT 35.2 (*)    MCH 24.9 (*)    RDW 16.3 (*)    Monocytes Absolute 1.1 (*)    All other components within normal limits  COMPREHENSIVE METABOLIC PANEL - Abnormal; Notable for the following:    Potassium 2.9 (*)    Glucose, Bld 133 (*)    BUN 27 (*)    Creatinine, Ser 1.21 (*)    GFR calc non Af Amer 42 (*)    GFR calc Af Amer 48 (*)    All other components within normal limits  URINE MICROSCOPIC-ADD ON - Abnormal; Notable for the following:    Squamous Epithelial / LPF FEW (*)    Bacteria, UA MANY (*)    All other components within normal limits     MDM   Final diagnoses:  Urinary tract infection without hematuria, site unspecified  Hypokalemia   Ongoing, chronic confusion, with vague symptoms today. Patient may not be taking her medications correctly. She is nontoxic, cooperative. Suspect mild UTI, contributing to confusion, with mild hypokalemia.  Nursing Notes Reviewed/ Care Coordinated Applicable Imaging Reviewed Interpretation of Laboratory Data incorporated into ED treatment  The patient appears reasonably screened and/or stabilized for discharge and I doubt any other medical condition or other [facility] requiring further screening, evaluation, or treatment in the ED at this time prior to discharge.  Plan: Home Medications- Keflex, Potassium; Home Treatments- rest, fluids; return here if the recommended treatment, does not improve the symptoms; Recommended follow up- PCP 3 days   I personally performed the services described in this documentation, which was scribed in my presence. The recorded information has been reviewed and is accurate.        Daleen Bo, MD 10/01/15 [phone]

## 2015-09-30 NOTE — ED Notes (Signed)
Spoke with ER physician-verbal orders received for CBC and BMP

## 2015-09-30 NOTE — ED Notes (Signed)
For the past 2 days she has been asking for her husband who passed away in March 06, 1994.  She went to the doctor yesterday and he changed some medication.  She has been shaking. She is picking up things and thinking they are other objects. She was crying for a while and we finally got her to stop.  Do not think she is taking her meds correctly. She lives by herself.

## 2015-09-30 NOTE — ED Notes (Signed)
Discharge instructions given to pt and her son-- Son verbalized understanding . Pt wheeled off unit .

## 2015-10-03 ENCOUNTER — Ambulatory Visit: Payer: Self-pay | Admitting: Genetic Counselor

## 2015-10-03 ENCOUNTER — Telehealth: Payer: Self-pay | Admitting: Genetic Counselor

## 2015-10-03 ENCOUNTER — Encounter: Payer: Self-pay | Admitting: Genetic Counselor

## 2015-10-03 DIAGNOSIS — C569 Malignant neoplasm of unspecified ovary: Secondary | ICD-10-CM

## 2015-10-03 DIAGNOSIS — Z1379 Encounter for other screening for genetic and chromosomal anomalies: Secondary | ICD-10-CM

## 2015-10-03 NOTE — Progress Notes (Addendum)
HPI: Ms. Godfrey was previously seen in the [place] clinic due to a personal history of ovarian cancer and concerns regarding a hereditary predisposition to cancer. Please refer to our prior cancer genetics clinic note for more information regarding Ms. Poli's medical, social and family histories, and our assessment and recommendations, at the time. Ms. Grosvenor recent genetic test results were disclosed to her, as were recommendations warranted by these results. These results and recommendations are discussed in more detail below.  FAMILY HISTORY:  We obtained a detailed, 4-generation family history.  Significant diagnoses are listed below: Family History  Problem Relation Age of Onset  . Heart attack Father   . Cancer Paternal Grandmother     "stomach cancer"  . Cancer Cousin     possibly ovarian cancer    The patient had four children, one who was killed by a car at age 29. She had three brothers and one sister, two brothers have died in accidents. Her mother died at 4 from unknown reasons. She had two sisters who died of heart disease. Her maternal grandparents died of old age. Her father died of a heart attack at 22. He had three sisters and two brothers. One brother had a daughter who died of possibly ovarian cancer. Her paternal grandmother had possible stomach cancer. Patient's maternal ancestors are of Caucasian descent, and paternal ancestors are of [place] descent. There is no reported Ashkenazi Jewish ancestry. There is no known consanguinity.  GENETIC TEST RESULTS: At the time of Ms. Zucco visit, we recommended she pursue genetic testing of the BRCA1 and BRCA2 testing, reflexing to the OvaNext gene panel. The OvaNext gene panel offered by [facility] and includes sequencing and rearrangement analysis for the following 24 genes: ATM, BARD1, BRCA1, BRCA2, BRIP1, CDH1, CHEK2, EPCAM, MLH1, MRE11A, MSH2, MSH6, MUTYH, NBN, NF1, PALB2, PMS2, PTEN, RAD50, RAD51C, RAD51D,  SMARCA4, STK11, and TP53. The report date is September 26, 2015.  Genetic testing was normal, and did not reveal a deleterious mutation in these genes. The test report has been scanned into EPIC and is located under the Molecular Pathology section of the Results Review tab. The patient will be sent a copy of this result.  We discussed with Ms. Mckiver that since the current genetic testing is not perfect, it is possible there may be a gene mutation in one of these genes that current testing cannot detect, but that chance is small. We also discussed, that it is possible that another gene that has not yet been discovered, or that we have not yet tested, is responsible for the cancer diagnoses in the family, and it is, therefore, important to remain in touch with cancer genetics in the future so that we can continue to offer Ms. Aubert the most up to date genetic testing.   Genetic testing did detect a Variant of Unknown Significance in the RAD51C gene called c.790G>A.  At this time, it is unknown if this variant is associated with increased cancer risk or if this is a normal finding, but most variants such as this get reclassified to being inconsequential. It should not be used to make medical management decisions. With time, we suspect the lab will determine the significance of this variant, if any. If we do learn more about it, we will try to contact Ms. Cadena to discuss it further. However, it is important to stay in touch with [place] periodically and keep the address and phone number up to date.  Update: RAD51C VUS  has been reclassified to Likely Benign, on August 17, 2018.  ADDITIONAL GENETIC TESTING: We discussed with Ms. Ferrufino that there are other genes that are associated with increased cancer risk that can be analyzed. The laboratories that offer such testing look at these additional genes via a hereditary cancer gene panel. Should Ms. Schmuck wish to pursue additional genetic testing, we are happy to discuss and  coordinate this testing, at any time.    CANCER SCREENING RECOMMENDATIONS: This result is reassuring and indicates that Ms. Tang likely does not have an increased risk for a future cancer due to a mutation in one of these genes. This normal test also suggests that Ms. Caloca cancer was most likely not due to an inherited predisposition associated with one of these genes.  Most cancers happen by chance and this negative test suggests that her cancer falls into this category.  We, therefore, recommended she continue to follow the cancer management and screening guidelines provided by her oncology and primary healthcare provider.   RECOMMENDATIONS FOR FAMILY MEMBERS: Women in this family might be at some increased risk of developing cancer, over the general population risk, simply due to the family history of cancer. We recommended women in this family have a yearly mammogram beginning at age 54, or 29 years younger than the earliest onset of cancer, an an annual clinical breast exam, and perform monthly breast self-exams. Women in this family should also have a gynecological exam as recommended by their primary provider. All family members should have a colonoscopy by age 52.  FOLLOW-UP: Lastly, we discussed with Ms. Shugars that cancer genetics is a rapidly advancing field and it is possible that new genetic tests will be appropriate for her and/or her family members in the future. We encouraged her to remain in contact with cancer genetics on an annual basis so we can update her personal and family histories and let her know of advances in cancer genetics that may benefit this family.   Our contact number was provided. Ms. Higby questions were answered to her satisfaction, and she knows she is welcome to call us at anytime with additional questions or concerns.   Roma Kayser, MS, [facility] Certified Genetic Counselor [email or web address]

## 2015-10-03 NOTE — Telephone Encounter (Signed)
Revealed negative genetic testing, other than RAD51C VUS.  Will send patient a copy of results

## 2015-10-04 ENCOUNTER — Other Ambulatory Visit (HOSPITAL_COMMUNITY): Payer: Self-pay | Admitting: Pulmonary Disease

## 2015-10-04 DIAGNOSIS — R410 Disorientation, unspecified: Secondary | ICD-10-CM

## 2015-10-09 ENCOUNTER — Ambulatory Visit (HOSPITAL_COMMUNITY): Payer: Medicare HMO

## 2015-10-10 ENCOUNTER — Encounter (HOSPITAL_COMMUNITY): Payer: Medicare HMO

## 2015-10-10 ENCOUNTER — Encounter (HOSPITAL_COMMUNITY): Payer: Self-pay | Admitting: Hematology & Oncology

## 2015-10-10 ENCOUNTER — Encounter (HOSPITAL_COMMUNITY): Payer: Medicare HMO | Attending: Hematology | Admitting: Hematology & Oncology

## 2015-10-10 VITALS — BP 118/70 | HR 79 | Temp 98.5°F | Resp 18 | Wt 168.8 lb

## 2015-10-10 DIAGNOSIS — C482 Malignant neoplasm of peritoneum, unspecified: Secondary | ICD-10-CM

## 2015-10-10 DIAGNOSIS — J910 Malignant pleural effusion: Secondary | ICD-10-CM

## 2015-10-10 DIAGNOSIS — N390 Urinary tract infection, site not specified: Secondary | ICD-10-CM

## 2015-10-10 DIAGNOSIS — C569 Malignant neoplasm of unspecified ovary: Secondary | ICD-10-CM | POA: Insufficient documentation

## 2015-10-10 DIAGNOSIS — N189 Chronic kidney disease, unspecified: Secondary | ICD-10-CM | POA: Diagnosis present

## 2015-10-10 DIAGNOSIS — R319 Hematuria, unspecified: Secondary | ICD-10-CM

## 2015-10-10 DIAGNOSIS — D631 Anemia in chronic kidney disease: Secondary | ICD-10-CM | POA: Diagnosis present

## 2015-10-10 DIAGNOSIS — R300 Dysuria: Secondary | ICD-10-CM

## 2015-10-10 LAB — CBC WITH DIFFERENTIAL/PLATELET
BASOS ABS: 0 10*3/uL (ref 0.0–0.1)
BASOS PCT: 0 %
Eosinophils Absolute: 0.3 10*3/uL (ref 0.0–0.7)
Eosinophils Relative: 3 %
HEMATOCRIT: 38.3 % (ref 36.0–46.0)
HEMOGLOBIN: 11.9 g/dL — AB (ref 12.0–15.0)
Lymphocytes Relative: 26 %
Lymphs Abs: 2.7 10*3/uL (ref 0.7–4.0)
MCH: 25.5 pg — ABNORMAL LOW (ref 26.0–34.0)
MCHC: 31.1 g/dL (ref 30.0–36.0)
MCV: 82.2 fL (ref 78.0–100.0)
MONOS PCT: 6 %
Monocytes Absolute: 0.6 10*3/uL (ref 0.1–1.0)
NEUTROS ABS: 6.5 10*3/uL (ref 1.7–7.7)
NEUTROS PCT: 65 %
Platelets: 236 10*3/uL (ref 150–400)
RBC: 4.66 MIL/uL (ref 3.87–5.11)
RDW: 16.5 % — ABNORMAL HIGH (ref 11.5–15.5)
WBC: 10.2 10*3/uL (ref 4.0–10.5)

## 2015-10-10 LAB — URINALYSIS, ROUTINE W REFLEX MICROSCOPIC
BILIRUBIN URINE: NEGATIVE
GLUCOSE, UA: NEGATIVE mg/dL
KETONES UR: NEGATIVE mg/dL
Nitrite: NEGATIVE
PH: 5 (ref 5.0–8.0)
Protein, ur: NEGATIVE mg/dL
Specific Gravity, Urine: 1.015 (ref 1.005–1.030)
Urobilinogen, UA: 0.2 mg/dL (ref 0.0–1.0)

## 2015-10-10 LAB — COMPREHENSIVE METABOLIC PANEL
ALBUMIN: 3.9 g/dL (ref 3.5–5.0)
ALK PHOS: 55 U/L (ref 38–126)
ALT: 11 U/L — AB (ref 14–54)
AST: 18 U/L (ref 15–41)
Anion gap: 10 (ref 5–15)
BILIRUBIN TOTAL: 0.5 mg/dL (ref 0.3–1.2)
BUN: 27 mg/dL — AB (ref 6–20)
CALCIUM: 9.2 mg/dL (ref 8.9–10.3)
CO2: 28 mmol/L (ref 22–32)
Chloride: 103 mmol/L (ref 101–111)
Creatinine, Ser: 1.25 mg/dL — ABNORMAL HIGH (ref 0.44–1.00)
GFR calc Af Amer: 46 mL/min — ABNORMAL LOW (ref >60)
GFR calc non Af Amer: 40 mL/min — ABNORMAL LOW (ref >60)
GLUCOSE: 108 mg/dL — AB (ref 65–99)
Potassium: 3.6 mmol/L (ref 3.5–5.1)
Sodium: 141 mmol/L (ref 135–145)
TOTAL PROTEIN: 7.2 g/dL (ref 6.5–8.1)

## 2015-10-10 LAB — URINE MICROSCOPIC-ADD ON

## 2015-10-10 MED ORDER — GABAPENTIN 300 MG PO CAPS: ORAL_CAPSULE | ORAL | Status: DC

## 2015-10-10 MED ORDER — CIPROFLOXACIN HCL 500 MG PO TABS: ORAL_TABLET | ORAL | Status: DC

## 2015-10-10 NOTE — Patient Instructions (Signed)
[place] at [facility] Discharge Instructions  RECOMMENDATIONS MADE BY THE CONSULTANT AND ANY TEST RESULTS WILL BE SENT TO YOUR REFERRING PHYSICIAN.  Exam and discussion by Dr. Whitney Muse Will check another urine today to see if you have blood in your urine.  If you do we will be sending you back to a urologist. If any issues with your labs we will contact you.  Follow-up in 3 months with labs and office visit.  Thank you for choosing [place] at [facility] to provide your oncology and hematology care.  To afford each patient quality time with our provider, please arrive at least 15 minutes before your scheduled appointment time.    You need to re-schedule your appointment should you arrive 10 or more minutes late.  We strive to give you quality time with our providers, and arriving late affects you and other patients whose appointments are after yours.  Also, if you no show three or more times for appointments you may be dismissed from the clinic at the providers discretion.     Again, thank you for choosing [facility].  Our hope is that these requests will decrease the amount of time that you wait before being seen by our physicians.       _____________________________________________________________  Should you have questions after your visit to [facility], please contact our office at [phone] between the hours of 8:30 a.m. and 4:30 p.m.  Voicemails left after 4:30 p.m. will not be returned until the following business day.  For prescription refill requests, have your pharmacy contact our office.

## 2015-10-10 NOTE — Progress Notes (Signed)
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  Began Carboplatin/Paclitaxel on 09/22/2014. On day 8 cycle 3 Abraxane substituted for Taxol.   INTERVAL HISTORY: [name] 80 y.o. female returns for follow-up of her primary peritoneal carcinoma.  [name] is accompanied by her boyfriend and her son. She is here to discuss the results of her most recent scans. She has been eating well, "too much". She has hematuria intermittently. Her boyfriend reports that she has not been getting around nearly as well, ever since her episode of confusion.  She continues to experience back pain; this pain is unchanged. The pain starts in her left side and radiates across her back. She continues to take pain medication to manage it. She recently went to refill the pain medication and the pharmacy is out until Thursday. She is agreeable to adding neurontin to her pain management regimen. The pain worsens while walking. The pain continues while sitting, but is improved. The pain is worsened when she takes a deep breath. The pain does wake her up at night, but it is not excruciating. Pain medication helps to ease it off and she is able to walk better. Her boyfriend notes that she has not attended the last two Sundays of church and this indicates to him that she is really feeling bad.  When she saw Dr. Luan Pulling for recent confusion, she was scheduled for a head CT. However, the CT was cancelled and is in the process of being rescheduled.  She continues to eat very well. Denies constipation or diffuse abdominal pain. Denies chest pain.  MEDICAL HISTORY: Past Medical History    Diagnosis Date  . GERD (gastroesophageal reflux disease)   . Osteoarthritis   . Peripheral edema   . CHF (congestive heart failure), NYHA class IV (HCC)     diastolic  . Cancer ([place])   . UTI (lower urinary tract infection)     has GERD; CONSTIPATION, CHRONIC; HEMATOCHEZIA; OSTEOARTHRITIS, LOWER LEG; KNEE PAIN; BURSITIS, KNEE; Dysphagia; Malignant pleural effusion; Edema of both legs; Heart failure, diastolic, with acute decompensation ([place]); Hyperkalemia; Anemia, unspecified; Peritoneal carcinoma ([place]); Rectal bleed; Chest pain; Protein-calorie malnutrition, severe ([place]); Dysphagia, pharyngoesophageal phase; Constipation; Acute on chronic congestive heart failure ([place]); Acute on chronic respiratory failure with hypoxia ([place]); Ovarian cancer ([place]); Anemia due to chemotherapy; Cellulitis; and Genetic testing on her problem list.      Ovarian cancer ([place])   07/25/2014 Pathology Results Left pleural effusion positive for malignant cell consistent with adenocarcinoma.   07/27/2014 - 08/05/2014 Hospital Admission Heart failure with malignant pleural effusion   07/28/2014 Pathology Results Left pleural effusion positive for malignant cells consistent with adenocarcinoma (CK7 pos, CK20 neg, WT-1 pos, ER weakly pos, PR neg, TTF-1 neg, GCDFP-15 neg).  Findings favor gyn/ovarian primary.   08/01/2014 Pathology Results [facility] genomic findings: BRCA2 R2360f*31, STK11 loss, MYC amplification-equivocal, TP53 R273C, MLL2 QJ1941.  Targeted treatments include Olaparib (BRCA2) and Everolimus/Temsirolimus (STK11).   08/03/2014 Tumor Marker CA 125 4954   08/04/2014 Procedure Left pleurX catheter by Dr. VLucianne LeiTright   09/08/2014 - 09/14/2014 Hospital Admission Hospitalized with the following main issues: acute on chronic CHF, acute on chronic respiratory failure with hypoxia, and malignant plueral effusion on right  09/13/2014 Procedure Right PleurX catheter by Dr. Cyndia Bent   09/13/2014 Pathology Results Right  pleural effusion positive for malignant adenocarcinoma   09/21/2014 Tumor Marker CA 125 [id number]   09/22/2014 - 11/17/2014 Chemotherapy Carboplatin/Paclitaxel days 1, 8, 15 every 28 days.   10/20/2014 Tumor Marker CA 125 335   11/23/2014 -  Chemotherapy Carboplatin/Abraxane day 1, 8, 15 every 28 days.     is allergic to penicillins.  [name] had no medications administered during this visit.  SURGICAL HISTORY: Past Surgical History  Procedure Laterality Date  . Cholecystectomy    . Cardiac catheterization    . Total abdominal hysterectomy  40 years ago    patient does not know if ovaries were removed  . Appendectomy    . Total knee arthroplasty Left   . Chest tube insertion Left 08/04/2014    Procedure: INSERTION PLEURAL DRAINAGE CATHETER;  Surgeon: Ivin Poot, MD;  Location: [place];  Service: Thoracic;  Laterality: Left;  . Chest tube insertion Right 09/13/2014    Procedure: INSERTION PLEURAL DRAINAGE CATHETER;  Surgeon: Gaye Pollack, MD;  Location: [place];  Service: Thoracic;  Laterality: Right;  . Removal of pleural drainage catheter Bilateral 12/06/2014    Procedure: REMOVAL OF PLEURAL DRAINAGE CATHETER;  Surgeon: Ivin Poot, MD;  Location: [facility];  Service: Thoracic;  Laterality: Bilateral;    SOCIAL HISTORY: Social History   Social History  . Marital Status: Widowed    Spouse Name: N/A  . Number of Children: 4  . Years of Education: N/A   Occupational History  . nurses aid    Social History Main Topics  . Smoking status: Never Smoker   . Smokeless tobacco: Not on file  . Alcohol Use: No  . Drug Use: No  . Sexual Activity: Not on file   Other Topics Concern  . Not on file   Social History Narrative    FAMILY HISTORY: Noncontributory   Review of Systems  Constitutional: Negative for fever, chills, fatigue, and weight loss.  HENT: Negative for congestion, hearing loss, nosebleeds, sore throat and tinnitus.   Eyes: Negative for blurred vision, double  vision, pain and discharge.  Respiratory: Negative for cough, hemoptysis, sputum production, shortness of breath and wheezing.  Positive for difficulty breathing. (chronic) Cardiovascular: Negative for chest pain, palpitations, claudication, leg swelling and PND.  Gastrointestinal: Negative for heartburn, nausea, vomiting, abdominal pain, diarrhea, constipation, blood in stool and melena.  Genitourinary: Positive for hematuria. Negative for dysuria, urgency, frequency.     Intermittent hematuria  Musculoskeletal: Positive for back pain. Negative for myalgias, joint pain and falls.  Skin: Negative for itching and rash.  Neurological: Negative for dizziness, tingling, tremors, speech change, focal weakness, sensory changes, seizures, loss of consciousness, weakness and headaches.  Endo/Heme/Allergies: Does not bruise/bleed easily.  Psychiatric/Behavioral: Negative for depression, suicidal ideas, memory loss and substance abuse. The patient is not nervous/anxious and does not have insomnia.   14 point review of systems was performed and is negative except as detailed under history of present illness and above   PHYSICAL EXAMINATION  ECOG PERFORMANCE STATUS: 1 - Symptomatic but completely ambulatory  Filed Vitals:   10/10/15 1415  BP: 118/70  Pulse: 79  Temp: 98.5 F (36.9 C)  Resp: 18   Physical Exam  Constitutional: She is oriented to person, place, and time and well-developed, well-nourished, and in no distress.  HENT:  Head: Normocephalic and atraumatic.  Mouth/Throat: No oropharyngeal exudate.  Eyes: Conjunctivae and EOM are  normal. Pupils are equal, round, and reactive to light. No scleral icterus.  Neck: Normal range of motion. Neck supple. No JVD present. No thyromegaly present.  Cardiovascular: Normal rate and regular rhythm.  Exam reveals no friction rub.   No murmur heard. Pulmonary/Chest: Effort normal and breath sounds normal. No respiratory distress. She has no wheezes.  She has no rales.  Abdominal: Soft. Bowel sounds are normal. She exhibits no distension. Non-tender. There is no rebound and no guarding.  no palpable mass Musculoskeletal: Normal range of motion.  Lymphadenopathy:    She has no cervical adenopathy.  Neurological: She is alert and oriented to person, place, and time. No cranial nerve deficit. Coordination normal.  Skin: Skin is warm and dry.  Psychiatric: Mood, memory, affect and judgment normal.    LABORATORY DATA: I have reviewed the data as listed. CBC    Component Value Date/Time   WBC 10.2 10/10/2015 1332   RBC 4.66 10/10/2015 1332   RBC 4.29 07/27/2014 2257   HGB 11.9* 10/10/2015 1332   HCT 38.3 10/10/2015 1332   PLT 236 10/10/2015 1332   MCV 82.2 10/10/2015 1332   MCH 25.5* 10/10/2015 1332   MCHC 31.1 10/10/2015 1332   RDW 16.5* 10/10/2015 1332   LYMPHSABS 2.7 10/10/2015 1332   MONOABS 0.6 10/10/2015 1332   EOSABS 0.3 10/10/2015 1332   BASOSABS 0.0 10/10/2015 1332   CMP     Component Value Date/Time   NA 141 10/10/2015 1332   K 3.6 10/10/2015 1332   CL 103 10/10/2015 1332   CO2 28 10/10/2015 1332   GLUCOSE 108* 10/10/2015 1332   BUN 27* 10/10/2015 1332   CREATININE 1.25* 10/10/2015 1332   CALCIUM 9.2 10/10/2015 1332   PROT 7.2 10/10/2015 1332   ALBUMIN 3.9 10/10/2015 1332   AST 18 10/10/2015 1332   ALT 11* 10/10/2015 1332   ALKPHOS 55 10/10/2015 1332   BILITOT 0.5 10/10/2015 1332   GFRNONAA 40* 10/10/2015 1332   GFRAA 46* 10/10/2015 1332    RADIOLOGY:  CLINICAL DATA: Three months of low back pain without known injury.  EXAM: LUMBAR SPINE - COMPLETE 4+ VIEW  COMPARISON: Lumbar MRI of July 19th 2016 and abdominal and pelvic CT scan of August 17, 2015.  FINDINGS: The lumbar vertebral bodies are diffusely osteopenic. There is no compression fracture. The disc space heights are reasonably well maintained for age. There is mild facet joint hypertrophy at L5-S1. There is no spondylolisthesis.  The pedicles and transverse processes are intact where visualized. The observed portions of the sacrum exhibit no acute abnormalities.  IMPRESSION: There is diffuse osteopenia. There is no lytic or blastic lesion or acute fracture. There is mild facet joint hypertrophy at L5-S1.   Electronically Signed  By: David Martinique M.D.  On: 09/28/2015 14:09   ASSESSMENT and THERAPY PLAN:   Stage IV primary peritoneal carcinoma BRCA 2 positivity Chronic L side, Left lower back pain  We reviewed all of her studies over the past few months regarding her back pain. She has a cystic lesion that measures approximately 5 cm in the L pelvic cul-de-sac. We discussed whether or not that is the cause of her pain, I do not think it is. Although her CA-125 has been WNL, we discussed that it could be residual disease but has been stable and I would continue with observation.   I have written her a prescription for neurontin to take nightly to aid in her back pain management.  I have ordered a urinalysis  and culture to check for a UTI, this has been an issue for her.  She has had some recent confusion. Head CT has been ordered by Dr. Luan Pulling.  I advised the family to let us know if things worsen.   Follow up in 3 months with repeat labs. We will call with the results of today's urine test.  All questions were answered. The patient knows to call the clinic with any problems, questions or concerns. We can certainly see the patient much sooner if necessary.   This note was signed electronically.  This document serves as a record of services personally performed by Ancil Linsey, MD. It was created on her behalf by Arlyce Harman, a trained medical scribe. The creation of this record is based on the scribe's personal observations and the provider's statements to them. This document has been checked and approved by the attending provider.  I have reviewed the above documentation for accuracy and  completeness and I agree with the above.  Kelby Fam. Whitney Muse, MD

## 2015-10-11 LAB — CA 125: CA 125: 47.4 U/mL — AB (ref 0.0–38.1)

## 2015-10-12 ENCOUNTER — Other Ambulatory Visit (HOSPITAL_COMMUNITY): Payer: Self-pay | Admitting: Pulmonary Disease

## 2015-10-12 ENCOUNTER — Ambulatory Visit (HOSPITAL_COMMUNITY)
Admission: RE | Admit: 2015-10-12 | Discharge: 2015-10-12 | Disposition: A | Discharge status: Home / Self Care | Payer: Medicare HMO | Source: Ambulatory Visit | Attending: Pulmonary Disease | Admitting: Pulmonary Disease

## 2015-10-12 DIAGNOSIS — R410 Disorientation, unspecified: Secondary | ICD-10-CM | POA: Insufficient documentation

## 2015-10-12 DIAGNOSIS — R4182 Altered mental status, unspecified: Secondary | ICD-10-CM

## 2015-10-12 DIAGNOSIS — C569 Malignant neoplasm of unspecified ovary: Secondary | ICD-10-CM | POA: Insufficient documentation

## 2015-10-12 LAB — URINE CULTURE

## 2015-10-12 MED ORDER — GADOBENATE DIMEGLUMINE 529 MG/ML IV SOLN
6.0000 mL | Freq: Once | INTRAVENOUS | Status: AC | PRN
  Administered 2015-10-12: 6 mL via INTRAVENOUS

## 2015-10-16 ENCOUNTER — Encounter (HOSPITAL_COMMUNITY): Payer: Self-pay | Admitting: Lab

## 2015-10-16 NOTE — Progress Notes (Signed)
Referral sent to [facility].  Faxed records on 11/8.  They will call patient with appt

## 2015-10-17 ENCOUNTER — Encounter (HOSPITAL_COMMUNITY): Payer: Self-pay | Admitting: Lab

## 2015-10-17 NOTE — Progress Notes (Signed)
Appt for [facility] 12/22 at 245 in [place].  Patient is aware of appointment.

## 2015-11-01 ENCOUNTER — Other Ambulatory Visit (HOSPITAL_COMMUNITY): Payer: Medicare HMO

## 2015-11-02 ENCOUNTER — Encounter (HOSPITAL_COMMUNITY): Payer: Medicare HMO | Attending: Hematology & Oncology

## 2015-11-02 VITALS — BP 131/72 | HR 76 | Temp 98.2°F | Resp 18

## 2015-11-02 DIAGNOSIS — Z452 Encounter for adjustment and management of vascular access device: Secondary | ICD-10-CM | POA: Diagnosis not present

## 2015-11-02 DIAGNOSIS — C482 Malignant neoplasm of peritoneum, unspecified: Secondary | ICD-10-CM

## 2015-11-02 MED ORDER — SODIUM CHLORIDE 0.9 % IJ SOLN
10.0000 mL | INTRAMUSCULAR | Status: DC | PRN
  Administered 2015-11-02: 10 mL via INTRAVENOUS
  Filled 2015-11-02: qty 10

## 2015-11-02 MED ORDER — HEPARIN SOD (PORK) LOCK FLUSH 100 UNIT/ML IV SOLN
INTRAVENOUS | Status: AC
  Filled 2015-11-02: qty 5

## 2015-11-02 MED ORDER — HEPARIN SOD (PORK) LOCK FLUSH 100 UNIT/ML IV SOLN
500.0000 [IU] | Freq: Once | INTRAVENOUS | Status: AC
  Administered 2015-11-02: 500 [IU] via INTRAVENOUS

## 2015-11-02 NOTE — Progress Notes (Signed)
[name] presented for Portacath access and flush. Proper placement of portacath confirmed by CXR. Portacath located right chest wall accessed with  H 20 needle. Good blood return present. Portacath flushed with 20ml NS and 500U/5ml Heparin and needle removed intact. Procedure without incident. Patient tolerated procedure well.   

## 2015-11-02 NOTE — Patient Instructions (Signed)
[facility] at [facility] Discharge Instructions  RECOMMENDATIONS MADE BY THE CONSULTANT AND ANY TEST RESULTS WILL BE SENT TO YOUR REFERRING PHYSICIAN.  Port flush today.   Please return as scheduled.    Thank you for choosing [facility] at [facility] to provide your oncology and hematology care.  To afford each patient quality time with our provider, please arrive at least 15 minutes before your scheduled appointment time.    You need to re-schedule your appointment should you arrive 10 or more minutes late.  We strive to give you quality time with our providers, and arriving late affects you and other patients whose appointments are after yours.  Also, if you no show three or more times for appointments you may be dismissed from the clinic at the providers discretion.     Again, thank you for choosing [facility].  Our hope is that these requests will decrease the amount of time that you wait before being seen by our physicians.       _____________________________________________________________  Should you have questions after your visit to [facility], please contact our office at [phone] between the hours of 8:30 a.m. and 4:30 p.m.  Voicemails left after 4:30 p.m. will not be returned until the following business day.  For prescription refill requests, have your pharmacy contact our office.     

## 2015-11-09 ENCOUNTER — Other Ambulatory Visit (HOSPITAL_COMMUNITY): Payer: Self-pay

## 2015-11-17 ENCOUNTER — Other Ambulatory Visit (HOSPITAL_COMMUNITY): Payer: Self-pay

## 2015-11-17 DIAGNOSIS — C482 Malignant neoplasm of peritoneum, unspecified: Secondary | ICD-10-CM

## 2015-11-22 ENCOUNTER — Other Ambulatory Visit (HOSPITAL_COMMUNITY): Payer: Self-pay | Admitting: Hematology & Oncology

## 2015-11-28 ENCOUNTER — Ambulatory Visit (INDEPENDENT_AMBULATORY_CARE_PROVIDER_SITE_OTHER): Payer: Medicare HMO | Admitting: Urology

## 2015-11-28 DIAGNOSIS — N952 Postmenopausal atrophic vaginitis: Secondary | ICD-10-CM | POA: Diagnosis not present

## 2015-11-28 DIAGNOSIS — R300 Dysuria: Secondary | ICD-10-CM

## 2015-11-28 DIAGNOSIS — Z87440 Personal history of urinary (tract) infections: Secondary | ICD-10-CM | POA: Diagnosis not present

## 2015-11-30 ENCOUNTER — Encounter (HOSPITAL_COMMUNITY): Payer: Medicare HMO

## 2015-11-30 DIAGNOSIS — C482 Malignant neoplasm of peritoneum, unspecified: Secondary | ICD-10-CM | POA: Diagnosis not present

## 2015-12-01 LAB — CA 125: CA 125: 101.2 U/mL — ABNORMAL HIGH (ref 0.0–38.1)

## 2015-12-10 ENCOUNTER — Other Ambulatory Visit (HOSPITAL_COMMUNITY): Payer: Self-pay | Admitting: Hematology & Oncology

## 2015-12-10 DIAGNOSIS — J910 Malignant pleural effusion: Secondary | ICD-10-CM

## 2015-12-10 DIAGNOSIS — C482 Malignant neoplasm of peritoneum, unspecified: Secondary | ICD-10-CM

## 2015-12-25 ENCOUNTER — Encounter (HOSPITAL_COMMUNITY): Payer: Self-pay

## 2015-12-25 ENCOUNTER — Encounter (HOSPITAL_COMMUNITY): Payer: Medicare HMO | Attending: Hematology & Oncology

## 2015-12-25 DIAGNOSIS — C482 Malignant neoplasm of peritoneum, unspecified: Secondary | ICD-10-CM | POA: Diagnosis not present

## 2015-12-25 DIAGNOSIS — Z452 Encounter for adjustment and management of vascular access device: Secondary | ICD-10-CM

## 2015-12-25 DIAGNOSIS — Z9889 Other specified postprocedural states: Secondary | ICD-10-CM | POA: Diagnosis not present

## 2015-12-25 LAB — COMPREHENSIVE METABOLIC PANEL
ALBUMIN: 3.5 g/dL (ref 3.5–5.0)
ALT: 12 U/L — AB (ref 14–54)
AST: 15 U/L (ref 15–41)
Alkaline Phosphatase: 66 U/L (ref 38–126)
Anion gap: 8 (ref 5–15)
BILIRUBIN TOTAL: 0.3 mg/dL (ref 0.3–1.2)
BUN: 26 mg/dL — ABNORMAL HIGH (ref 6–20)
CO2: 29 mmol/L (ref 22–32)
CREATININE: 1.23 mg/dL — AB (ref 0.44–1.00)
Calcium: 8.7 mg/dL — ABNORMAL LOW (ref 8.9–10.3)
Chloride: 103 mmol/L (ref 101–111)
GFR calc Af Amer: 47 mL/min — ABNORMAL LOW (ref >60)
GFR calc non Af Amer: 41 mL/min — ABNORMAL LOW (ref >60)
GLUCOSE: 87 mg/dL (ref 65–99)
POTASSIUM: 3.6 mmol/L (ref 3.5–5.1)
Sodium: 140 mmol/L (ref 135–145)
TOTAL PROTEIN: 7 g/dL (ref 6.5–8.1)

## 2015-12-25 LAB — CBC WITH DIFFERENTIAL/PLATELET
BASOS ABS: 0 10*3/uL (ref 0.0–0.1)
BASOS PCT: 0 %
Eosinophils Absolute: 0.3 10*3/uL (ref 0.0–0.7)
Eosinophils Relative: 3 %
HEMATOCRIT: 36.2 % (ref 36.0–46.0)
HEMOGLOBIN: 11.1 g/dL — AB (ref 12.0–15.0)
LYMPHS PCT: 25 %
Lymphs Abs: 2.3 10*3/uL (ref 0.7–4.0)
MCH: 24.1 pg — ABNORMAL LOW (ref 26.0–34.0)
MCHC: 30.7 g/dL (ref 30.0–36.0)
MCV: 78.7 fL (ref 78.0–100.0)
Monocytes Absolute: 0.7 10*3/uL (ref 0.1–1.0)
Monocytes Relative: 8 %
NEUTROS ABS: 6 10*3/uL (ref 1.7–7.7)
NEUTROS PCT: 64 %
Platelets: 262 10*3/uL (ref 150–400)
RBC: 4.6 MIL/uL (ref 3.87–5.11)
RDW: 16.9 % — AB (ref 11.5–15.5)
WBC: 9.4 10*3/uL (ref 4.0–10.5)

## 2015-12-25 MED ORDER — SODIUM CHLORIDE 0.9 % IJ SOLN
10.0000 mL | INTRAMUSCULAR | Status: DC | PRN
  Administered 2015-12-25: 10 mL via INTRAVENOUS
  Filled 2015-12-25: qty 10

## 2015-12-25 MED ORDER — HEPARIN SOD (PORK) LOCK FLUSH 100 UNIT/ML IV SOLN
500.0000 [IU] | Freq: Once | INTRAVENOUS | Status: AC
  Administered 2015-12-25: 500 [IU] via INTRAVENOUS
  Filled 2015-12-25: qty 5

## 2015-12-25 NOTE — Progress Notes (Signed)
..  [name] presented for Portacath access and flush.  Proper placement of portacath confirmed by CXR.  Portacath located rt chest wall accessed with  H 20 needle.  Good blood return present. Portacath flushed with 39ml NS and 500U/76ml Heparin and needle removed intact.  Procedure tolerated well and without incident.

## 2015-12-26 ENCOUNTER — Ambulatory Visit (HOSPITAL_COMMUNITY): Payer: Medicare HMO

## 2015-12-26 LAB — CA 125: CA 125: 118.4 U/mL — AB (ref 0.0–38.1)

## 2015-12-28 ENCOUNTER — Encounter (HOSPITAL_COMMUNITY): Payer: Medicare HMO

## 2015-12-29 ENCOUNTER — Ambulatory Visit (HOSPITAL_COMMUNITY)
Admission: RE | Admit: 2015-12-29 | Discharge: 2015-12-29 | Disposition: A | Discharge status: Home / Self Care | Payer: Medicare HMO | Source: Ambulatory Visit | Attending: Hematology & Oncology | Admitting: Hematology & Oncology

## 2015-12-29 ENCOUNTER — Other Ambulatory Visit (HOSPITAL_COMMUNITY): Payer: Self-pay | Admitting: Pulmonary Disease

## 2015-12-29 DIAGNOSIS — M899 Disorder of bone, unspecified: Secondary | ICD-10-CM | POA: Diagnosis not present

## 2015-12-29 DIAGNOSIS — C482 Malignant neoplasm of peritoneum, unspecified: Secondary | ICD-10-CM | POA: Diagnosis not present

## 2015-12-29 DIAGNOSIS — K449 Diaphragmatic hernia without obstruction or gangrene: Secondary | ICD-10-CM | POA: Insufficient documentation

## 2015-12-29 DIAGNOSIS — M545 Low back pain: Secondary | ICD-10-CM

## 2015-12-29 DIAGNOSIS — J910 Malignant pleural effusion: Secondary | ICD-10-CM | POA: Diagnosis present

## 2015-12-29 DIAGNOSIS — Z9221 Personal history of antineoplastic chemotherapy: Secondary | ICD-10-CM | POA: Insufficient documentation

## 2015-12-29 DIAGNOSIS — I2510 Atherosclerotic heart disease of native coronary artery without angina pectoris: Secondary | ICD-10-CM | POA: Diagnosis not present

## 2015-12-29 MED ORDER — IOHEXOL 300 MG/ML  SOLN
80.0000 mL | Freq: Once | INTRAMUSCULAR | Status: AC | PRN
  Administered 2015-12-29: 100 mL via INTRAVENOUS

## 2016-01-03 ENCOUNTER — Encounter (HOSPITAL_COMMUNITY): Payer: Medicare HMO | Attending: Hematology | Admitting: Hematology & Oncology

## 2016-01-03 ENCOUNTER — Encounter (HOSPITAL_COMMUNITY): Payer: Self-pay | Admitting: Hematology & Oncology

## 2016-01-03 VITALS — BP 136/75 | HR 81 | Temp 97.7°F | Resp 16 | Wt 183.0 lb

## 2016-01-03 DIAGNOSIS — R319 Hematuria, unspecified: Secondary | ICD-10-CM | POA: Diagnosis not present

## 2016-01-03 DIAGNOSIS — C569 Malignant neoplasm of unspecified ovary: Secondary | ICD-10-CM | POA: Diagnosis present

## 2016-01-03 DIAGNOSIS — D631 Anemia in chronic kidney disease: Secondary | ICD-10-CM | POA: Diagnosis present

## 2016-01-03 DIAGNOSIS — N189 Chronic kidney disease, unspecified: Secondary | ICD-10-CM | POA: Diagnosis present

## 2016-01-03 DIAGNOSIS — C482 Malignant neoplasm of peritoneum, unspecified: Secondary | ICD-10-CM | POA: Diagnosis not present

## 2016-01-03 DIAGNOSIS — R300 Dysuria: Secondary | ICD-10-CM

## 2016-01-03 DIAGNOSIS — J910 Malignant pleural effusion: Secondary | ICD-10-CM

## 2016-01-03 LAB — URINE MICROSCOPIC-ADD ON

## 2016-01-03 LAB — URINALYSIS, ROUTINE W REFLEX MICROSCOPIC
BILIRUBIN URINE: NEGATIVE
Glucose, UA: NEGATIVE mg/dL
Ketones, ur: NEGATIVE mg/dL
NITRITE: NEGATIVE
PH: 5 (ref 5.0–8.0)
Protein, ur: NEGATIVE mg/dL
SPECIFIC GRAVITY, URINE: 1.025 (ref 1.005–1.030)

## 2016-01-03 MED ORDER — CIPROFLOXACIN HCL 250 MG PO TABS: 250.0000 mg | ORAL_TABLET | Freq: Two times a day (BID) | ORAL | Status: DC

## 2016-01-03 NOTE — Patient Instructions (Signed)
[place] at [facility] Discharge Instructions  RECOMMENDATIONS MADE BY THE CONSULTANT AND ANY TEST RESULTS WILL BE SENT TO YOUR REFERRING PHYSICIAN.   When you came to [place] you were Stage IV cancer. You finished your treatment in March of 2016. The cancer went into a remission. Last year your cancer marker went up a little bit but scans looked ok. Your cancer marker went up again. So Dr. Whitney Muse ordered scans again. The cancer is back in the abdominal area. This cancer could be causing the back pain.   What is recommended is that you go through treatment again - that we rechallenge you with the same drugs we used before.   Avastin is a drug we would like to add to your chemotherapy plan. Avastin works to starve the tumor by blocking the blood vessels that feed the tumor. If we can get you back into a remission with chemo + Avastin, we will then just have you take Avastin alone.   Once ovarian cancer has left the ovary - it isn't beneficial to put a woman through a major surgery such as a hysterectomy because the cancer has already moved to another area.   You have vaginal and urethral atrophy - which is why you may be burning when you urinate. The cream we give you can be rubbed on your vaginal area once a day or even just a couple of times a week. We will also check your urine to be sure that you don't have a urinary tract infection.   We move back to the 4th floor on 01/22/16.   Chemo: Carboplatin, Abraxane, Avastin will be the drugs that we give. If her cancer marker doesn't move within 2 cycles of chemo then we will change drugs.   Chemo teaching with [facility] Nurse Navigator [phone]  Bring your estrogen cream with you next week so we can read the directions.  Call us on Friday if her cough is not better and we will call in an antibiotic.   If the cancer gets better and the confusion continues then we will need to refer her to a neurologist.    Thank you for  choosing [place] at [facility] to provide your oncology and hematology care.  To afford each patient quality time with our provider, please arrive at least 15 minutes before your scheduled appointment time.   Beginning January 23rd 2017 lab work for the [facility] will be done in the  Main lab at [facility] on 1st floor. If you have a lab appointment with the [facility] please come in thru the  Main Entrance and check in at the main information desk  You need to re-schedule your appointment should you arrive 10 or more minutes late.  We strive to give you quality time with our providers, and arriving late affects you and other patients whose appointments are after yours.  Also, if you no show three or more times for appointments you may be dismissed from the clinic at the providers discretion.     Again, thank you for choosing [facility].  Our hope is that these requests will decrease the amount of time that you wait before being seen by our physicians.       _____________________________________________________________  Should you have questions after your visit to [facility], please contact our office at [phone] between the hours of 8:30 a.m. and 4:30 p.m.  Voicemails left  after 4:30 p.m. will not be returned until the following business day.  For prescription refill requests, have your pharmacy contact our office.

## 2016-01-03 NOTE — Progress Notes (Signed)
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 female returns for follow-up of her primary peritoneal carcinoma.  [name] is accompanied by her boyfriend and her son. She is here to discuss the results of her most recent scans. She has been eating well, "too much". She has hematuria intermittently. She underwent evaluation by urology with Dr. Eulogio Ditch.  [name] is accompanied by her boyfriend and her son. I personally reviewed and discussed the results of recent imaging studies with the patient and her family.   She continues to experience confusion, "about things that goes on and minor things". Sometimes she cannot remember what the day is. She is unsure if her parents had dementia. When asked if the confusion began with her cancer diagnosis, her son states that she has always had confusion that was worse at nights. However, this confusion has worsened lately.  She continues to experience back pain, stating "my back is giving me a fit". This pain is constant. When asked if she experiences abdominal pain along with the back pain, she states that it is mostly her back.   Reports her urine has been burning lately.   Her son reports that his mother had a cold recently and was prescribed an antibiotic by Dr. Luan Pulling. She has finished this medication but continues to have a cough.   She is currently scheduled for a MRI Spine on 01/11/16.  She denies nausea or vomiting. She denies SOB. No change at all in appetite.  MEDICAL HISTORY: Past Medical History  Diagnosis Date  . GERD (gastroesophageal reflux disease)   . Osteoarthritis   . Peripheral edema   . CHF (congestive heart failure), NYHA class IV (HCC)     diastolic  . UTI (lower urinary tract  infection)   . Cancer ([place])     has GERD; CONSTIPATION, CHRONIC; HEMATOCHEZIA; OSTEOARTHRITIS, LOWER LEG; KNEE PAIN; BURSITIS, KNEE; Dysphagia; Malignant pleural effusion; Edema of both legs; Heart failure, diastolic, with acute decompensation ([place]); Hyperkalemia; Anemia, unspecified; Rectal bleed; Chest pain; Protein-calorie malnutrition, severe ([place]); Dysphagia, pharyngoesophageal phase; Constipation; Acute on chronic congestive heart failure ([place]); Acute on chronic respiratory failure with hypoxia ([place]); Primary peritoneal carcinomatosis ([place]); Anemia due to chemotherapy; Cellulitis; and Genetic testing on her problem list.      Primary peritoneal carcinomatosis ([place])   07/25/2014 Pathology Results Left pleural effusion positive for malignant cell consistent with adenocarcinoma.   07/27/2014 - 08/05/2014 Hospital Admission Heart failure with malignant pleural effusion   07/28/2014 Pathology Results Left pleural effusion positive for malignant cells consistent with adenocarcinoma (CK7 pos, CK20 neg, WT-1 pos, ER weakly pos, PR neg, TTF-1 neg, GCDFP-15 neg).  Findings favor gyn/ovarian primary.   08/01/2014 Pathology Results [facility] genomic findings: BRCA2 R2348f*31, STK11 loss, MYC amplification-equivocal, TP53 R273C, MLL2 QF7510.  Targeted treatments include Olaparib (BRCA2) and Everolimus/Temsirolimus (STK11).   08/03/2014 Tumor Marker CA 125 4954   08/04/2014 Procedure Left pleurX catheter by Dr. VLucianne LeiTright   09/08/2014 - 09/14/2014 Hospital Admission Hospitalized with the following main issues: acute on chronic CHF, acute on chronic respiratory failure with hypoxia, and malignant plueral effusion on right    09/13/2014 Procedure Right PleurX catheter by Dr. BCyndia Bent  09/13/2014 Pathology Results Right pleural effusion positive for malignant adenocarcinoma   09/21/2014 Tumor Marker CA 125  [id number]   09/22/2014 - 11/17/2014 Chemotherapy Carboplatin/Paclitaxel days 1, 8, 15 every 28 days.    10/20/2014 Tumor Marker CA 125 335   11/23/2014 - 02/23/2015 Chemotherapy Carboplatin/Abraxane day 1, 8, 15 every 28 days.   06/06/2015 Tumor Marker CA 125: 21.7    08/04/2015 Tumor Marker CA 125: 23.6    08/17/2015 Imaging CT abd/pelvis- Resolution of ascites and pleural effusions since previous study. Near complete resolution of soft tissue stranding and nodularity in the omental and mesenteric fat.  Resolution of left inguinal lymphadenopathy since prior exam.   10/10/2015 Tumor Marker CA 125: 47.4 (H)    11/30/2015 Tumor Marker CA 125: 101.2 (H)   12/25/2015 Tumor Marker CA 125: 118.4 (H)    12/29/2015 Progression    12/29/2015 Imaging CT CAP- progression of peritoneal carcinomatosis, with interval increase in number and size of numerous large lesions predominantly in the low anatomic pelvis   01/11/2016 -  Chemotherapy Carboplatin/Abraxane days 1, 8, 15 every 21 days.     is allergic to penicillins.  [name] does not currently have medications on file.  SURGICAL HISTORY: Past Surgical History  Procedure Laterality Date  . Cholecystectomy    . Cardiac catheterization    . Total abdominal hysterectomy  40 years ago    patient does not know if ovaries were removed  . Appendectomy    . Total knee arthroplasty Left   . Chest tube insertion Left 08/04/2014    Procedure: INSERTION PLEURAL DRAINAGE CATHETER;  Surgeon: Ivin Poot, MD;  Location: [facility];  Service: Thoracic;  Laterality: Left;  . Chest tube insertion Right 09/13/2014    Procedure: INSERTION PLEURAL DRAINAGE CATHETER;  Surgeon: Gaye Pollack, MD;  Location: [facility];  Service: Thoracic;  Laterality: Right;  . Removal of pleural drainage catheter Bilateral 12/06/2014    Procedure: REMOVAL OF PLEURAL DRAINAGE CATHETER;  Surgeon: Ivin Poot, MD;  Location: [facility];  Service: Thoracic;  Laterality: Bilateral;    SOCIAL HISTORY: Social History   Social History  . Marital Status: Widowed    Spouse Name: N/A  . Number of Children: 4    . Years of Education: N/A   Occupational History  . nurses aid    Social History Main Topics  . Smoking status: Never Smoker   . Smokeless tobacco: Not on file  . Alcohol Use: No  . Drug Use: No  . Sexual Activity: Not on file   Other Topics Concern  . Not on file   Social History Narrative    FAMILY HISTORY: Noncontributory   Review of Systems  Constitutional: Negative for fever, chills, fatigue, and weight loss.  HENT: Negative for congestion, hearing loss, nosebleeds, sore throat and tinnitus.   Eyes: Negative for blurred vision, double vision, pain and discharge.  Respiratory: Negative for cough, hemoptysis, sputum production, shortness of breath and wheezing.  Positive for difficulty breathing. (chronic) Cardiovascular: Negative for chest pain, palpitations, claudication, leg swelling and PND.  Gastrointestinal: Negative for heartburn, nausea, vomiting, abdominal pain, diarrhea, constipation, blood in stool and melena.  Genitourinary: Positive for dysuria. Negative for hematuria, urgency, frequency.     Dysuria  Musculoskeletal: Positive for back pain. Negative for myalgias, joint pain and falls.  Skin: Negative for itching and rash.  Neurological: Negative for dizziness, tingling, tremors, speech change, focal weakness, sensory changes, seizures, loss of consciousness, weakness and headaches.  Endo/Heme/Allergies: Does not bruise/bleed easily.  Psychiatric/Behavioral: Positive for confusion. Negative for depression, suicidal ideas, memory loss and substance  abuse. The patient is not nervous/anxious and does not have insomnia.   14 point review of systems was performed and is negative except as detailed under history of present illness and above   PHYSICAL EXAMINATION ECOG PERFORMANCE STATUS: 1 - Symptomatic but completely ambulatory  Filed Vitals:   01/03/16 1500  BP: 136/75  Pulse: 81  Temp: 97.7 F (36.5 C)  Resp: 16   Physical Exam  Constitutional: She is  oriented to person, place, and time and well-developed, well-nourished, and in no distress.  Ambulates with cane. HENT:  Head: Normocephalic and atraumatic.  Mouth/Throat: No oropharyngeal exudate.  Eyes: Conjunctivae and EOM are normal. Pupils are equal, round, and reactive to light. No scleral icterus.  Neck: Normal range of motion. Neck supple. No JVD present. No thyromegaly present.  Cardiovascular: Normal rate and regular rhythm.  Exam reveals no friction rub.   No murmur heard. Pulmonary/Chest: Effort normal and breath sounds normal. No respiratory distress. She has no wheezes. She has no rales.  Abdominal: Soft. Bowel sounds are normal. She exhibits no distension. Non-tender. There is no rebound and no guarding.  no palpable mass Musculoskeletal: Normal range of motion.  Lymphadenopathy:    She has no cervical adenopathy.  Neurological: She is alert and oriented to person, place, and time. No cranial nerve deficit. Coordination normal.  Skin: Skin is warm and dry.  Psychiatric: Mood, memory, affect and judgment normal.    LABORATORY DATA: I have reviewed the data as listed. CBC    Component Value Date/Time   WBC 10.4 01/09/2016 1720   RBC 5.07 01/09/2016 1720   RBC 4.29 07/27/2014 2257   HGB 12.0 01/09/2016 1720   HCT 39.8 01/09/2016 1720   PLT 251 01/09/2016 1720   MCV 78.5 01/09/2016 1720   MCH 23.7* 01/09/2016 1720   MCHC 30.2 01/09/2016 1720   RDW 17.3* 01/09/2016 1720   LYMPHSABS 2.0 01/09/2016 1720   MONOABS 0.8 01/09/2016 1720   EOSABS 0.3 01/09/2016 1720   BASOSABS 0.0 01/09/2016 1720   CMP     Component Value Date/Time   NA 139 01/09/2016 1720   K 3.8 01/09/2016 1720   CL 102 01/09/2016 1720   CO2 27 01/09/2016 1720   GLUCOSE 123* 01/09/2016 1720   BUN 21* 01/09/2016 1720   CREATININE 1.27* 01/09/2016 1720   CALCIUM 8.8* 01/09/2016 1720   PROT 7.5 01/09/2016 1720   ALBUMIN 3.8 01/09/2016 1720   AST 17 01/09/2016 1720   ALT 8* 01/09/2016 1720    ALKPHOS 81 01/09/2016 1720   BILITOT 0.4 01/09/2016 1720   GFRNONAA 39* 01/09/2016 1720   GFRAA 45* 01/09/2016 1720    RADIOLOGY: I have personally reviewed the radiological images as listed and agreed with the findings in the report.  Study Result     CLINICAL DATA: 81 year old female with history of primary peritoneal carcinomatosis (adenocarcinoma) status post chemotherapy (last treatment 2 weeks ago). Shortness of breath and cough for the past year. Nausea for the past year.  EXAM: CT CHEST, ABDOMEN, AND PELVIS WITH CONTRAST  TECHNIQUE: Multidetector CT imaging of the chest, abdomen and pelvis was performed following the standard protocol during bolus administration of intravenous contrast.  CONTRAST: 161m OMNIPAQUE IOHEXOL 300 MG/ML SOLN  COMPARISON: CT the abdomen and pelvis 08/17/2015. CT of the chest, abdomen and pelvis 07/29/2014. Multiple other prior examinations.  FINDINGS: CT CHEST FINDINGS  Mediastinum/Lymph Nodes: Heart size is normal. There is no significant pericardial fluid, thickening or pericardial calcification. There is atherosclerosis  of the thoracic aorta, the great vessels of the mediastinum and the coronary arteries, including calcified atherosclerotic plaque in the left main, left anterior descending, left circumflex and right coronary arteries. No pathologically enlarged mediastinal or hilar lymph nodes. Large hiatal hernia. Right internal jugular single-lumen porta cath with tip terminating in the distal superior vena cava. No axillary lymphadenopathy.  Lungs/Pleura: Tiny 2 mm subpleural nodule in the periphery of the right middle lobe (image 34 of series 7), unchanged compared to prior study 07/29/2014, considered benign, presumably a subpleural lymph node. No other larger more suspicious appearing pulmonary nodules or masses are identified at this time. Areas of linear scarring are noted throughout the lung bases bilaterally.  No acute consolidative airspace disease. No pleural effusions.  Musculoskeletal/Soft Tissues: There are no aggressive appearing lytic or blastic lesions noted in the visualized portions of the skeleton.  CT ABDOMEN AND PELVIS FINDINGS  Hepatobiliary: No suspicious appearing cystic or solid hepatic lesions. Status post cholecystectomy. Mild intrahepatic biliary ductal dilatation is unchanged, as is mild extrahepatic biliary ductal dilatation (common bile duct measures up to 12 mm in the porta hepatis), presumably reflective of benign post cholecystectomy physiology.  Pancreas: No pancreatic mass. No pancreatic ductal dilatation. No pancreatic or peripancreatic fluid or inflammatory changes.  Spleen: Unremarkable.  Adrenals/Urinary Tract: Bilateral adrenal glands are normal in appearance. Multifocal cortical thinning throughout the kidneys bilaterally, in addition to mild bilateral renal atrophy. No suspicious renal lesions. No hydroureteronephrosis. Urinary bladder is partially decompressed, but unremarkable in appearance.  Stomach/Bowel: Intraabdominal portion of the stomach is normal. No pathologic dilatation of small bowel or colon. Extensive colonic diverticulosis is noted, most severe throughout the descending and sigmoid colon, without surrounding inflammatory changes to suggest an acute diverticulitis at this time.  Vascular/Lymphatic: Atherosclerotic calcifications throughout the abdominal and pelvic vasculature, without evidence of aneurysm or dissection. No lymphadenopathy noted in the abdomen or pelvis.  Reproductive: Status post total abdominal hysterectomy. Ovaries are not confidently identified and may be surgically absent or atrophic.  Other: Compared to the prior examination from 08/17/2015 the abnormal fluid collections and soft tissue masses in the low anatomic pelvis have significantly increased in size. The largest of these lesions now include a  6.0 x 4.8 cm heterogeneously enhancing multi-cystic lesion along the right pelvic sidewall (image 102 of series 2), and a similar appearing lesion measuring 4.2 x 7.1 cm immediately inferior to the proximal sigmoid colon (image 106 of series 2), which is inseparable from the colon on coronal and sagittal reformatted images. Several other smaller collections are also noted in the pelvis, all of which are larger than the prior examination. Some slight haziness is also noted in the omentum, best appreciated to the left of midline on images 71 and 73 of series 2, which could represent early omental involvement. No significant volume of free flowing ascites. No pneumoperitoneum.  Musculoskeletal: There are no aggressive appearing lytic or blastic lesions noted in the visualized portions of the skeleton.  IMPRESSION: 1. Today's study demonstrates progression of peritoneal carcinomatosis, with interval increase in number and size of numerous large lesions predominantly in the low anatomic pelvis, as detailed above. 2. No evidence of thoracic metastatic disease. 3. Atherosclerosis, including left main and 3 vessel coronary artery disease. Assessment for potential risk factor modification, dietary therapy or pharmacologic therapy may be warranted, if clinically indicated. 4. Large hiatal hernia. 5. Additional incidental findings, similar prior studies, as above.   Electronically Signed  By: Vinnie Langton M.D.  On: 12/29/2015 16:04  ASSESSMENT and THERAPY PLAN:  Stage IV primary peritoneal carcinoma BRCA 2 positivity Chronic L side, Left lower back pain Recurrent peritoneal carcinoma  I personally reviewed and discussed the results of recent imaging and laboratory studies with the patient and her family.  She wishes to be retreated. She is realistically in much better shape now than she was at time of diagnosis. I have recommended repeated prior therapy with the addition of  Avastin.  She was given estrogen cream to apply topically to the urethral area by Dr. Eulogio Ditch and I advised her she could use this. We will perform a U/A today.   She is currently scheduled for a MRI Spine on 01/11/16. Move MRI of the Spine and plan for chemotherapy next Thursday, 01/11/16. We will see her back one week post. She will need AVASTIN teaching with haley.   I will see her again in 1 week. At this next visit, the patient will bring her previously prescribed estrogen cream with her so that we can determine if she needs a new or revised prescription.   All questions were answered. The patient knows to call the clinic with any problems, questions or concerns. We can certainly see the patient much sooner if necessary.   This note was signed electronically.  This document serves as a record of services personally performed by Ancil Linsey, MD. It was created on her behalf by Arlyce Harman, a trained medical scribe. The creation of this record is based on the scribe's personal observations and the provider's statements to them. This document has been checked and approved by the attending provider.  I have reviewed the above documentation for accuracy and completeness and I agree with the above.  Kelby Fam. Whitney Muse, MD

## 2016-01-04 ENCOUNTER — Telehealth (HOSPITAL_COMMUNITY): Payer: Self-pay | Admitting: Hematology & Oncology

## 2016-01-04 NOTE — Telephone Encounter (Signed)
[employer] [id number] AUTH IS NOT REQUIRED FOR [phone] BUT IS FOR [id number] EMEND PER CSR SHE WILL FAX OVER PRE AUTH FORM  CASE# [id number] CALL REF# [id number]

## 2016-01-07 ENCOUNTER — Other Ambulatory Visit (HOSPITAL_COMMUNITY): Payer: Self-pay | Admitting: Hematology & Oncology

## 2016-01-08 ENCOUNTER — Other Ambulatory Visit (HOSPITAL_COMMUNITY): Payer: Self-pay | Admitting: Oncology

## 2016-01-08 MED ORDER — PROCHLORPERAZINE MALEATE 10 MG PO TABS: 10.0000 mg | ORAL_TABLET | Freq: Four times a day (QID) | ORAL | Status: AC | PRN

## 2016-01-08 MED ORDER — ONDANSETRON HCL 8 MG PO TABS: 8.0000 mg | ORAL_TABLET | Freq: Three times a day (TID) | ORAL | Status: AC | PRN

## 2016-01-08 MED ORDER — LIDOCAINE-PRILOCAINE 2.5-2.5 % EX CREA: TOPICAL_CREAM | CUTANEOUS | Status: AC

## 2016-01-08 NOTE — Patient Instructions (Addendum)
[name]   CHEMOTHERAPY INSTRUCTIONS  Premeds: Aloxi - high powered nausea medication given to reduce/prevent nausea/vomiting. Emend - high powered nausea medication given to reduce/prevent nausea/vomiting.  Dexamethasone - steroid - given to reduce the risk of you having an allergic type reaction to the chemotherapy. Dex can cause you to feel energized, nervous/anxious/jittery, make you have trouble sleeping, and/or make you feel hot/flushed in the face/neck and/or look pink/red in the face/neck. These side effects will pass as the Dex wears off. (takes 30 minutes to infuse)  Abraxane - myelosuppression (bone marrow suppression - lowers white blood cells, red blood cells, and platelets), sensory neuropathy, muscle and joint aches/pain, nausea/vomiting, diarrhea, mucositis, hair loss (takes 30 minutes to infuse) You will receive this on Days 1, 8, 15.  Carboplatin - this medication can be hard on your kidneys - this is why we need you to drink     m 64 oz of fluid (preferably water/decaff fluids) 2 days prior to chemo and for up to 4-5 days after chemo. Drink more if you can. This will help to keep your kidneys flushed. This can cause mild hair loss, lower your platelets (which keep you from bleeding out when you cut yourself), lower your white blood cells (fight infection), and cause nausea/vomiting. (only takes 30 minutes to infuse) You will receive this on Days 1, 8, 15.  Avastin - this medication is considered an anti-angiogenic therapy. Avastin is thought to work by causing the blood vessels to shrink away from the tumor, blocking the supply of oxygen and nutrients that the tumor needs to grow. Avastin may also cause the existing blood vessels to change in ways that help the chemotherapy reach the tumor more effectively. Avastin may also work to interfere with the growth of new blood vessels, causing the tumor to starve. Side Effects: Nosebleeds, High Blood  Pressure, Protein in Urine, & diarrhea. The first time you receive this it will take 90 minutes. The second time 60 minutes and thereafter 30 minutes. You will receive this on Day 1 every 21 days.    POTENTIAL SIDE EFFECTS OF TREATMENT: Increased Susceptibility to Infection, Vomiting, Constipation, Hair Thinning, Changes in Character of Skin and Nails (brittleness, dryness,etc.), Bone Marrow Suppression, Complete Hair Loss, Nausea, Diarrhea and Mouth Sores   SELF IMAGE NEEDS AND REFERRALS MADE: Obtain hair accessories as soon as possible (wigs, scarves, turbans,caps,etc.)   EDUCATIONAL MATERIALS GIVEN AND REVIEWED: Chemotherapy and You booklet Specific Instructions Sheets: Abraxane, Carboplatin, Avastin, Aloxi, Dexamethasone, Emend, EMLA cream, Zofran, Compazine   SELF CARE ACTIVITIES WHILE ON CHEMOTHERAPY: Increase your fluid intake 48 hours prior to treatment and drink at least 2 quarts per day after treatment., No alcohol intake., No aspirin or other medications unless approved by your oncologist., Eat foods that are light and easy to digest., Eat foods at cold or room temperature., No fried, fatty, or spicy foods immediately before or after treatment., Have teeth cleaned professionally before starting treatment. Keep dentures and partial plates clean., Use soft toothbrush and do not use mouthwashes that contain alcohol. Biotene is a good mouthwash that is available at most pharmacies or may be ordered by calling [phone]., Use warm salt water gargles (1 teaspoon salt per 1 quart warm water) before and after meals and at bedtime. Or you may rinse with 2 tablespoons of three -percent hydrogen peroxide mixed in eight ounces of water., Always use sunscreen with SPF (Sun Protection Factor) of 30 or higher., Use your nausea  medication as directed to prevent nausea., Use your stool softener or laxative as directed to prevent constipation. and Use your anti-diarrheal medication as directed to  stop diarrhea.  Please wash your hands for at least 30 seconds using warm soapy water. Handwashing is the #1 way to prevent the spread of germs. Stay away from sick people or people who are getting over a cold. If you develop respiratory systems such as green/yellow mucus production or productive cough or persistent cough let us know and we will see if you need an antibiotic. It is a good idea to keep a pair of gloves on when going into grocery stores/[employer] to decrease your risk of coming into contact with germs on the carts, etc. Carry alcohol hand gel with you at all times and use it frequently if out in public. All foods need to be cooked thoroughly. No raw foods. No medium or undercooked meats, eggs. If your food is cooked medium well, it does not need to be hot pink or saturated with bloody liquid at all. Vegetables and fruits need to be washed/rinsed under the faucet with a dish detergent before being consumed. You can eat raw fruits and vegetables unless we tell you otherwise but it would be best if you cooked them or bought frozen. Do not eat off of salad bars or hot bars unless you really trust the cleanliness of the restaurant. If you need dental work, please let Dr. Whitney Muse know before you go for your appointment so that we can coordinate the best possible time for you in regards to your chemo regimen. You need to also let your dentist know that you are actively taking chemo. We may need to do labs prior to your dental appointment. We also want your bowels moving at least every other day. If this is not happening, we need to know so that we can get you on a bowel regimen to help you go.     MEDICATIONS: You have been given prescriptions for the following medications:  Zofran/Ondansetron 8mg  tablet. Take 1 tablet every 8 hours as needed for nausea/vomiting. (#1 nausea med to take, this can constipate)  Compazine/Prochlorperazine 10mg  tablet. Take 1 tablet every 6 hours as needed for  nausea/vomiting. (#2 nausea med to take, this can make you sleepy)  EMLA cream. Apply a quarter size amount to port site 1 hour prior to chemo. Do not rub in. Cover with plastic wrap.   Over-the-Counter Meds:  Miralax 1 capful in 8 oz of fluid daily. May increase to two times a day if needed. This is a stool softener. If this doesn't work proceed you can add:  Senokot S  - start with 1 tablet two times a day and increase to 4 tablets two times a day if needed. (total of 8 tablets in a 24 hour period). This is a stimulant laxative.   Call us if this does not help your bowels move.   Imodium 2mg  capsule. Take 2 capsules after the 1st loose stool and then 1 capsule every 2 hours until you go a total of 12 hours without having a loose stool. Call the [facility] if loose stools continue. If diarrhea occurs @ bedtime, take 2 capsules @ bedtime. Then take 2 capsules every 4 hours until morning. Call [facility].   SYMPTOMS TO REPORT AS SOON AS POSSIBLE AFTER TREATMENT:  FEVER GREATER THAN 100.5 F  CHILLS WITH OR WITHOUT FEVER  NAUSEA AND VOMITING THAT IS NOT CONTROLLED WITH YOUR NAUSEA  MEDICATION  UNUSUAL SHORTNESS OF BREATH  UNUSUAL BRUISING OR BLEEDING  TENDERNESS IN MOUTH AND THROAT WITH OR WITHOUT PRESENCE OF ULCERS  URINARY PROBLEMS  BOWEL PROBLEMS  UNUSUAL RASH    Wear comfortable clothing and clothing appropriate for easy access to any Portacath or PICC line. Let us know if there is anything that we can do to make your therapy better!      I have been informed and understand all of the instructions given to me and have received a copy. I have been instructed to call the clinic [phone] or my family physician as soon as possible for continued medical care, if indicated. I do not have any more questions at this time but understand that I may call the [facility] or the Patient Navigator at [phone] during office hours should I have questions or need  assistance in obtaining follow-up care.            Nanoparticle Albumin-Bound Paclitaxel injection What is this medicine? NANOPARTICLE ALBUMIN-BOUND PACLITAXEL (Na no PAHR ti kuhl al BYOO muhn-bound PAK li TAX el) is a chemotherapy drug. It targets fast dividing cells, like cancer cells, and causes these cells to die. This medicine is used to treat advanced breast cancer and advanced lung cancer. This medicine may be used for other purposes; ask your health care provider or pharmacist if you have questions. What should I tell my health care provider before I take this medicine? They need to know if you have any of these conditions: -kidney disease -liver disease -low blood counts, like low platelets, red blood cells, or white blood cells -recent or ongoing radiation therapy -an unusual or allergic reaction to paclitaxel, albumin, other chemotherapy, other medicines, foods, dyes, or preservatives -pregnant or trying to get pregnant -breast-feeding How should I use this medicine? This drug is given as an infusion into a vein. It is administered in a hospital or clinic by a specially trained health care professional. Talk to your pediatrician regarding the use of this medicine in children. Special care may be needed. Overdosage: If you think you have taken too much of this medicine contact a poison control center or emergency room at once. NOTE: This medicine is only for you. Do not share this medicine with others. What if I miss a dose? It is important not to miss your dose. Call your doctor or health care professional if you are unable to keep an appointment. What may interact with this medicine? -cyclosporine -diazepam -ketoconazole -medicines to increase blood counts like filgrastim, pegfilgrastim, sargramostim -other chemotherapy drugs like cisplatin, doxorubicin, epirubicin, etoposide, teniposide, vincristine -quinidine -testosterone -vaccines -verapamil Talk to your  doctor or health care professional before taking any of these medicines: -acetaminophen -aspirin -ibuprofen -ketoprofen -naproxen This list may not describe all possible interactions. Give your health care provider a list of all the medicines, herbs, non-prescription drugs, or dietary supplements you use. Also tell them if you smoke, drink alcohol, or use illegal drugs. Some items may interact with your medicine. What should I watch for while using this medicine? Your condition will be monitored carefully while you are receiving this medicine. You will need important blood work done while you are taking this medicine. This drug may make you feel generally unwell. This is not uncommon, as chemotherapy can affect healthy cells as well as cancer cells. Report any side effects. Continue your course of treatment even though you feel ill unless your doctor tells you to stop. In some cases, you  may be given additional medicines to help with side effects. Follow all directions for their use. Call your doctor or health care professional for advice if you get a fever, chills or sore throat, or other symptoms of a cold or flu. Do not treat yourself. This drug decreases your body's ability to fight infections. Try to avoid being around people who are sick. This medicine may increase your risk to bruise or bleed. Call your doctor or health care professional if you notice any unusual bleeding. Be careful brushing and flossing your teeth or using a toothpick because you may get an infection or bleed more easily. If you have any dental work done, tell your dentist you are receiving this medicine. Avoid taking products that contain aspirin, acetaminophen, ibuprofen, naproxen, or ketoprofen unless instructed by your doctor. These medicines may hide a fever. Do not become pregnant while taking this medicine. Women should inform their doctor if they wish to become pregnant or think they might be pregnant. There is a  potential for serious side effects to an unborn child. Talk to your health care professional or pharmacist for more information. Do not breast-feed an infant while taking this medicine. Men are advised not to father a child while receiving this medicine. What side effects may I notice from receiving this medicine? Side effects that you should report to your doctor or health care professional as soon as possible: -allergic reactions like skin rash, itching or hives, swelling of the face, lips, or tongue -low blood counts - This drug may decrease the number of white blood cells, red blood cells and platelets. You may be at increased risk for infections and bleeding. -signs of infection - fever or chills, cough, sore throat, pain or difficulty passing urine -signs of decreased platelets or bleeding - bruising, pinpoint red spots on the skin, black, tarry stools, nosebleeds -signs of decreased red blood cells - unusually weak or tired, fainting spells, lightheadedness -breathing problems -changes in vision -chest pain -high or low blood pressure -mouth sores -nausea and vomiting -pain, swelling, redness or irritation at the injection site -pain, tingling, numbness in the hands or feet -slow or irregular heartbeat -swelling of the ankle, feet, hands Side effects that usually do not require medical attention (report to your doctor or health care professional if they continue or are bothersome): -aches, pains -changes in the color of fingernails -diarrhea -hair loss -loss of appetite This list may not describe all possible side effects. Call your doctor for medical advice about side effects. You may report side effects to FDA at 1-800-FDA-1088. Where should I keep my medicine? This drug is given in a hospital or clinic and will not be stored at home. NOTE: This sheet is a summary. It may not cover all possible information. If you have questions about this medicine, talk to your doctor,  pharmacist, or health care provider.    2016, [employer]vier/[employer] Standard. (2013-01-11 16:48:50) Carboplatin injection What is this medicine? CARBOPLATIN (KAR boe pla tin) is a chemotherapy drug. It targets fast dividing cells, like cancer cells, and causes these cells to die. This medicine is used to treat ovarian cancer and many other cancers. This medicine may be used for other purposes; ask your health care provider or pharmacist if you have questions. What should I tell my health care provider before I take this medicine? They need to know if you have any of these conditions: -blood disorders -hearing problems -kidney disease -recent or ongoing radiation therapy -an unusual or allergic  reaction to carboplatin, cisplatin, other chemotherapy, other medicines, foods, dyes, or preservatives -pregnant or trying to get pregnant -breast-feeding How should I use this medicine? This drug is usually given as an infusion into a vein. It is administered in a hospital or clinic by a specially trained health care professional. Talk to your pediatrician regarding the use of this medicine in children. Special care may be needed. Overdosage: If you think you have taken too much of this medicine contact a poison control center or emergency room at once. NOTE: This medicine is only for you. Do not share this medicine with others. What if I miss a dose? It is important not to miss a dose. Call your doctor or health care professional if you are unable to keep an appointment. What may interact with this medicine? -medicines for seizures -medicines to increase blood counts like filgrastim, pegfilgrastim, sargramostim -some antibiotics like amikacin, gentamicin, neomycin, streptomycin, tobramycin -vaccines Talk to your doctor or health care professional before taking any of these medicines: -acetaminophen -aspirin -ibuprofen -ketoprofen -naproxen This list may not describe all possible interactions. Give  your health care provider a list of all the medicines, herbs, non-prescription drugs, or dietary supplements you use. Also tell them if you smoke, drink alcohol, or use illegal drugs. Some items may interact with your medicine. What should I watch for while using this medicine? Your condition will be monitored carefully while you are receiving this medicine. You will need important blood work done while you are taking this medicine. This drug may make you feel generally unwell. This is not uncommon, as chemotherapy can affect healthy cells as well as cancer cells. Report any side effects. Continue your course of treatment even though you feel ill unless your doctor tells you to stop. In some cases, you may be given additional medicines to help with side effects. Follow all directions for their use. Call your doctor or health care professional for advice if you get a fever, chills or sore throat, or other symptoms of a cold or flu. Do not treat yourself. This drug decreases your body's ability to fight infections. Try to avoid being around people who are sick. This medicine may increase your risk to bruise or bleed. Call your doctor or health care professional if you notice any unusual bleeding. Be careful brushing and flossing your teeth or using a toothpick because you may get an infection or bleed more easily. If you have any dental work done, tell your dentist you are receiving this medicine. Avoid taking products that contain aspirin, acetaminophen, ibuprofen, naproxen, or ketoprofen unless instructed by your doctor. These medicines may hide a fever. Do not become pregnant while taking this medicine. Women should inform their doctor if they wish to become pregnant or think they might be pregnant. There is a potential for serious side effects to an unborn child. Talk to your health care professional or pharmacist for more information. Do not breast-feed an infant while taking this medicine. What side  effects may I notice from receiving this medicine? Side effects that you should report to your doctor or health care professional as soon as possible: -allergic reactions like skin rash, itching or hives, swelling of the face, lips, or tongue -signs of infection - fever or chills, cough, sore throat, pain or difficulty passing urine -signs of decreased platelets or bleeding - bruising, pinpoint red spots on the skin, black, tarry stools, nosebleeds -signs of decreased red blood cells - unusually weak or tired, fainting spells, lightheadedness -  breathing problems -changes in hearing -changes in vision -chest pain -high blood pressure -low blood counts - This drug may decrease the number of white blood cells, red blood cells and platelets. You may be at increased risk for infections and bleeding. -nausea and vomiting -pain, swelling, redness or irritation at the injection site -pain, tingling, numbness in the hands or feet -problems with balance, talking, walking -trouble passing urine or change in the amount of urine Side effects that usually do not require medical attention (report to your doctor or health care professional if they continue or are bothersome): -hair loss -loss of appetite -metallic taste in the mouth or changes in taste This list may not describe all possible side effects. Call your doctor for medical advice about side effects. You may report side effects to FDA at 1-800-FDA-1088. Where should I keep my medicine? This drug is given in a hospital or clinic and will not be stored at home. NOTE: This sheet is a summary. It may not cover all possible information. If you have questions about this medicine, talk to your doctor, pharmacist, or health care provider.    2016, [employer]vier/Gold Standard. (2008-02-23 14:38:05) Bevacizumab injection What is this medicine? BEVACIZUMAB (be va SIZ yoo mab) is a monoclonal antibody. It is used to treat cervical cancer, colorectal cancer,  glioblastoma multiforme, non-small cell lung cancer (NSCLC), ovarian cancer, and renal cell cancer. This medicine may be used for other purposes; ask your health care provider or pharmacist if you have questions. What should I tell my health care provider before I take this medicine? They need to know if you have any of these conditions: -blood clots -heart disease, including heart failure, heart attack, or chest pain (angina) -high blood pressure -infection (especially a virus infection such as chickenpox, cold sores, or herpes) -kidney disease -lung disease -prior chemotherapy with doxorubicin, daunorubicin, epirubicin, or other anthracycline type chemotherapy agents -recent or ongoing radiation therapy -recent surgery -stroke -an unusual or allergic reaction to bevacizumab, hamster proteins, mouse proteins, other medicines, foods, dyes, or preservatives -pregnant or trying to get pregnant -breast-feeding How should I use this medicine? This medicine is for infusion into a vein. It is given by a health care professional in a hospital or clinic setting. Talk to your pediatrician regarding the use of this medicine in children. Special care may be needed. Overdosage: If you think you have taken too much of this medicine contact a poison control center or emergency room at once. NOTE: This medicine is only for you. Do not share this medicine with others. What if I miss a dose? It is important not to miss your dose. Call your doctor or health care professional if you are unable to keep an appointment. What may interact with this medicine? Interactions are not expected. This list may not describe all possible interactions. Give your health care provider a list of all the medicines, herbs, non-prescription drugs, or dietary supplements you use. Also tell them if you smoke, drink alcohol, or use illegal drugs. Some items may interact with your medicine. What should I watch for while using this  medicine? Your condition will be monitored carefully while you are receiving this medicine. You will need important blood work and urine testing done while you are taking this medicine. During your treatment, let your health care professional know if you have any unusual symptoms, such as difficulty breathing. This medicine may rarely cause 'gastrointestinal perforation' (holes in the stomach, intestines or colon), a serious side effect requiring  surgery to repair. This medicine should be started at least 28 days following major surgery and the site of the surgery should be totally healed. Check with your doctor before scheduling dental work or surgery while you are receiving this treatment. Talk to your doctor if you have recently had surgery or if you have a wound that has not healed. Do not become pregnant while taking this medicine or for 6 months after stopping it. Women should inform their doctor if they wish to become pregnant or think they might be pregnant. There is a potential for serious side effects to an unborn child. Talk to your health care professional or pharmacist for more information. Do not breast-feed an infant while taking this medicine. This medicine has caused ovarian failure in some women. This medicine may interfere with the ability to have a child. You should talk to your doctor or health care professional if you are concerned about your fertility. What side effects may I notice from receiving this medicine? Side effects that you should report to your doctor or health care professional as soon as possible: -allergic reactions like skin rash, itching or hives, swelling of the face, lips, or tongue -signs of infection - fever or chills, cough, sore throat, pain or trouble passing urine -signs of decreased platelets or bleeding - bruising, pinpoint red spots on the skin, black, tarry stools, nosebleeds, blood in the urine -breathing problems -changes in vision -chest  pain -confusion -jaw pain, especially after dental work -mouth sores -seizures -severe abdominal pain -severe headache -sudden numbness or weakness of the face, arm or leg -swelling of legs or ankles -symptoms of a stroke: change in mental awareness, inability to talk or move one side of the body (especially in patients with lung cancer) -trouble passing urine or change in the amount of urine -trouble speaking or understanding -trouble walking, dizziness, loss of balance or coordination Side effects that usually do not require medical attention (report to your doctor or health care professional if they continue or are bothersome): -constipation -diarrhea -dry skin -headache -loss of appetite -nausea, vomiting This list may not describe all possible side effects. Call your doctor for medical advice about side effects. You may report side effects to FDA at 1-800-FDA-1088. Where should I keep my medicine? This drug is given in a hospital or clinic and will not be stored at home. NOTE: This sheet is a summary. It may not cover all possible information. If you have questions about this medicine, talk to your doctor, pharmacist, or health care provider.    2016, [employer]. (2015-01-17 16:58:44) Palonosetron Injection What is this medicine? PALONOSETRON (pal oh NOE se tron) is used to prevent nausea and vomiting caused by chemotherapy. It also helps prevent delayed nausea and vomiting that may occur a few days after your treatment. This medicine may be used for other purposes; ask your health care provider or pharmacist if you have questions. What should I tell my health care provider before I take this medicine? They need to know if you have any of these conditions: -an unusual or allergic reaction to palonosetron, dolasetron, granisetron, ondansetron, other medicines, foods, dyes, or preservatives -pregnant or trying to get pregnant -breast-feeding How should I use this  medicine? This medicine is for infusion into a vein. It is given by a health care professional in a hospital or clinic setting. Talk to your pediatrician regarding the use of this medicine in children. While this drug may be prescribed for children as young  as 1 month for selected conditions, precautions do apply. Overdosage: If you think you have taken too much of this medicine contact a poison control center or emergency room at once. NOTE: This medicine is only for you. Do not share this medicine with others. What if I miss a dose? This does not apply. What may interact with this medicine? -certain medicines for depression, anxiety, or psychotic disturbances -fentanyl -linezolid -MAOIs like Carbex, Eldepryl, Marplan, Nardil, and Parnate -methylene blue (injected into a vein) -tramadol This list may not describe all possible interactions. Give your health care provider a list of all the medicines, herbs, non-prescription drugs, or dietary supplements you use. Also tell them if you smoke, drink alcohol, or use illegal drugs. Some items may interact with your medicine. What should I watch for while using this medicine? Your condition will be monitored carefully while you are receiving this medicine. What side effects may I notice from receiving this medicine? Side effects that you should report to your doctor or health care professional as soon as possible: -allergic reactions like skin rash, itching or hives, swelling of the face, lips, or tongue -breathing problems -confusion -dizziness -fast, irregular heartbeat -fever and chills -loss of balance or coordination -seizures -sweating -swelling of the hands and feet -tremors -unusually weak or tired Side effects that usually do not require medical attention (report to your doctor or health care professional if they continue or are bothersome): -constipation or diarrhea -headache This list may not describe all possible side effects.  Call your doctor for medical advice about side effects. You may report side effects to FDA at 1-800-FDA-1088. Where should I keep my medicine? This drug is given in a hospital or clinic and will not be stored at home. NOTE: This sheet is a summary. It may not cover all possible information. If you have questions about this medicine, talk to your doctor, pharmacist, or health care provider.    2016, [employer]. (2013-09-24 10:38:36) Dexamethasone injection What is this medicine? DEXAMETHASONE (dex a METH a sone) is a corticosteroid. It is used to treat inflammation of the skin, joints, lungs, and other organs. Common conditions treated include asthma, allergies, and arthritis. It is also used for other conditions, like blood disorders and diseases of the adrenal glands. This medicine may be used for other purposes; ask your health care provider or pharmacist if you have questions. What should I tell my health care provider before I take this medicine? They need to know if you have any of these conditions: -blood clotting problems -Cushing's syndrome -diabetes -glaucoma -heart problems or disease -high blood pressure -infection like herpes, measles, tuberculosis, or chickenpox -kidney disease -liver disease -mental problems -myasthenia gravis -osteoporosis -previous heart attack -seizures -stomach, ulcer or intestine disease including colitis and diverticulitis -thyroid problem -an unusual or allergic reaction to dexamethasone, corticosteroids, other medicines, lactose, foods, dyes, or preservatives -pregnant or trying to get pregnant -breast-feeding How should I use this medicine? This medicine is for injection into a muscle, joint, lesion, soft tissue, or vein. It is given by a health care professional in a hospital or clinic setting. Talk to your pediatrician regarding the use of this medicine in children. Special care may be needed. Overdosage: If you think you have  taken too much of this medicine contact a poison control center or emergency room at once. NOTE: This medicine is only for you. Do not share this medicine with others. What if I miss a dose? This may not apply. If you  are having a series of injections over a prolonged period, try not to miss an appointment. Call your doctor or health care professional to reschedule if you are unable to keep an appointment. What may interact with this medicine? Do not take this medicine with any of the following medications: -mifepristone, RU-486 -vaccines This medicine may also interact with the following medications: -amphotericin B -antibiotics like clarithromycin, erythromycin, and troleandomycin -aspirin and aspirin-like drugs -barbiturates like phenobarbital -carbamazepine -cholestyramine -cholinesterase inhibitors like donepezil, galantamine, rivastigmine, and tacrine -cyclosporine -digoxin -diuretics -ephedrine -female hormones, like estrogens or progestins and birth control pills -indinavir -isoniazid -ketoconazole -medicines for diabetes -medicines that improve muscle tone or strength for conditions like myasthenia gravis -NSAIDs, medicines for pain and inflammation, like ibuprofen or naproxen -phenytoin -rifampin -thalidomide -warfarin This list may not describe all possible interactions. Give your health care provider a list of all the medicines, herbs, non-prescription drugs, or dietary supplements you use. Also tell them if you smoke, drink alcohol, or use illegal drugs. Some items may interact with your medicine. What should I watch for while using this medicine? Your condition will be monitored carefully while you are receiving this medicine. If you are taking this medicine for a long time, carry an identification card with your name and address, the type and dose of your medicine, and your doctor's name and address. This medicine may increase your risk of getting an infection. Stay  away from people who are sick. Tell your doctor or health care professional if you are around anyone with measles or chickenpox. Talk to your health care provider before you get any vaccines that you take this medicine. If you are going to have surgery, tell your doctor or health care professional that you have taken this medicine within the last twelve months. Ask your doctor or health care professional about your diet. You may need to lower the amount of salt you eat. The medicine can increase your blood sugar. If you are a diabetic check with your doctor if you need help adjusting the dose of your diabetic medicine. What side effects may I notice from receiving this medicine? Side effects that you should report to your doctor or health care professional as soon as possible: -allergic reactions like skin rash, itching or hives, swelling of the face, lips, or tongue -black or tarry stools -change in the amount of urine -changes in vision -confusion, excitement, restlessness, a false sense of well-being -fever, sore throat, sneezing, cough, or other signs of infection, wounds that will not heal -hallucinations -increased thirst -mental depression, mood swings, mistaken feelings of self importance or of being mistreated -pain in hips, back, ribs, arms, shoulders, or legs -pain, redness, or irritation at the injection site -redness, blistering, peeling or loosening of the skin, including inside the mouth -rounding out of face -swelling of feet or lower legs -unusual bleeding or bruising -unusual tired or weak -wounds that do not heal Side effects that usually do not require medical attention (report to your doctor or health care professional if they continue or are bothersome): -diarrhea or constipation -change in taste -headache -nausea, vomiting -skin problems, acne, thin and shiny skin -touble sleeping -unusual growth of hair on the face or body -weight gain This list may not  describe all possible side effects. Call your doctor for medical advice about side effects. You may report side effects to FDA at 1-800-FDA-1088. Where should I keep my medicine? This drug is given in a hospital or clinic and will not be  stored at home. NOTE: This sheet is a summary. It may not cover all possible information. If you have questions about this medicine, talk to your doctor, pharmacist, or health care provider.    2016, [employer]. (2008-03-10 14:04:12) Fosaprepitant injection What is this medicine? FOSAPREPITANT (fos ap RE pi tant) is used together with other medicines to prevent nausea and vomiting caused by cancer treatment (chemotherapy). This medicine may be used for other purposes; ask your health care provider or pharmacist if you have questions. What should I tell my health care provider before I take this medicine? They need to know if you have any of these conditions: -liver disease -an unusual or allergic reaction to fosaprepitant, aprepitant, medicines, foods, dyes, or preservatives -pregnant or trying to get pregnant -breast-feeding How should I use this medicine? This medicine is for injection into a vein. It is given by a health care professional in a hospital or clinic setting. Talk to your pediatrician regarding the use of this medicine in children. Special care may be needed. Overdosage: If you think you have taken too much of this medicine contact a poison control center or emergency room at once. NOTE: This medicine is only for you. Do not share this medicine with others. What if I miss a dose? This does not apply. What may interact with this medicine? Do not take this medicine with any of these medicines: -cisapride -flibanserin -lomitapide -pimozide This medicine may also interact with the following medications: -diltiazem -female hormones, like estrogens or progestins and birth control pills -medicines for fungal infections like  ketoconazole and itraconazole -medicines for HIV -medicines for seizures or to control epilepsy like carbamazepine or phenytoin -medicines used for sleep or anxiety disorders like alprazolam, diazepam, or midazolam -nefazodone -paroxetine -ranolazine -rifampin -some chemotherapy medications like etoposide, ifosfamide, vinblastine, vincristine -some antibiotics like clarithromycin, erythromycin, troleandomycin -steroid medicines like dexamethasone or methylprednisolone -tolbutamide -warfarin This list may not describe all possible interactions. Give your health care provider a list of all the medicines, herbs, non-prescription drugs, or dietary supplements you use. Also tell them if you smoke, drink alcohol, or use illegal drugs. Some items may interact with your medicine. What should I watch for while using this medicine? Do not take this medicine if you already have nausea and vomiting. Ask your health care provider what to do if you already have nausea. Birth control pills and other methods of hormonal contraception (for example, IUD or patch) may not work properly while you are taking this medicine. Use an extra method of birth control during treatment and for 1 month after your last dose of fosaprepitant. This medicine should not be used continuously for a long time. Visit your doctor or health care professional for regular check-ups. This medicine may change your liver function blood test results. What side effects may I notice from receiving this medicine? Side effects that you should report to your doctor or health care professional as soon as possible: -allergic reactions like skin rash, itching or hives, swelling of the face, lips, or tongue -breathing problems -changes in heart rhythm -high or low blood pressure -pain, redness, or irritation at site where injected -rectal bleeding -serious dizziness or disorientation, confusion -sharp or severe stomach pain -sharp pain in your  leg Side effects that usually do not require medical attention (report to your doctor or health care professional if they continue or are bothersome): -constipation or diarrhea -hair loss -headache -hiccups -loss of appetite -nausea -upset stomach -tiredness This list may not describe  all possible side effects. Call your doctor for medical advice about side effects. You may report side effects to FDA at 1-800-FDA-1088. Where should I keep my medicine? This drug is given in a hospital or clinic and will not be stored at home. NOTE: This sheet is a summary. It may not cover all possible information. If you have questions about this medicine, talk to your doctor, pharmacist, or health care provider.    2016, [employer]. (2015-01-04 10:45:34) Lidocaine; Prilocaine cream What is this medicine? LIDOCAINE; PRILOCAINE (LYE doe kane; PRIL oh kane) is a topical anesthetic that causes loss of feeling in the skin and surrounding tissues. It is used to numb the skin before procedures or injections. This medicine may be used for other purposes; ask your health care provider or pharmacist if you have questions. What should I tell my health care provider before I take this medicine? They need to know if you have any of these conditions: -glucose-6-phosphate deficiencies -heart disease -kidney or liver disease -methemoglobinemia -an unusual or allergic reaction to lidocaine, prilocaine, other medicines, foods, dyes, or preservatives -pregnant or trying to get pregnant -breast-feeding How should I use this medicine? This medicine is for external use only on the skin. Do not take by mouth. Follow the directions on the prescription label. Wash hands before and after use. Do not use more or leave in contact with the skin longer than directed. Do not apply to eyes or open wounds. It can cause irritation and blurred or temporary loss of vision. If this medicine comes in contact with your eyes,  immediately rinse the eye with water. Do not touch or rub the eye. Contact your health care provider right away. Talk to your pediatrician regarding the use of this medicine in children. While this medicine may be prescribed for children for selected conditions, precautions do apply. Overdosage: If you think you have taken too much of this medicine contact a poison control center or emergency room at once. NOTE: This medicine is only for you. Do not share this medicine with others. What if I miss a dose? This medicine is usually only applied once prior to each procedure. It must be in contact with the skin for a period of time for it to work. If you applied this medicine later than directed, tell your health care professional before starting the procedure. What may interact with this medicine? -acetaminophen -chloroquine -dapsone -medicines to control heart rhythm -nitrates like nitroglycerin and nitroprusside -other ointments, creams, or sprays that may contain anesthetic medicine -phenobarbital -phenytoin -quinine -sulfonamides like sulfacetamide, sulfamethoxazole, sulfasalazine and others This list may not describe all possible interactions. Give your health care provider a list of all the medicines, herbs, non-prescription drugs, or dietary supplements you use. Also tell them if you smoke, drink alcohol, or use illegal drugs. Some items may interact with your medicine. What should I watch for while using this medicine? Be careful to avoid injury to the treated area while it is numb and you are not aware of pain. Avoid scratching, rubbing, or exposing the treated area to hot or cold temperatures until complete sensation has returned. The numb feeling will wear off a few hours after applying the cream. What side effects may I notice from receiving this medicine? Side effects that you should report to your doctor or health care professional as soon as possible: -blurred vision -chest  pain -difficulty breathing -dizziness -drowsiness -fast or irregular heartbeat -skin rash or itching -swelling of your throat, lips, or  face -trembling Side effects that usually do not require medical attention (report to your doctor or health care professional if they continue or are bothersome): -changes in ability to feel hot or cold -redness and swelling at the application site This list may not describe all possible side effects. Call your doctor for medical advice about side effects. You may report side effects to FDA at 1-800-FDA-1088. Where should I keep my medicine? Keep out of reach of children. Store at room temperature between 15 and 30 degrees C (59 and 86 degrees F). Keep container tightly closed. Throw away any unused medicine after the expiration date. NOTE: This sheet is a summary. It may not cover all possible information. If you have questions about this medicine, talk to your doctor, pharmacist, or health care provider.    2016, [employer]. (2008-05-23 17:14:35) Ondansetron tablets What is this medicine? ONDANSETRON (on DAN se tron) is used to treat nausea and vomiting caused by chemotherapy. It is also used to prevent or treat nausea and vomiting after surgery. This medicine may be used for other purposes; ask your health care provider or pharmacist if you have questions. What should I tell my health care provider before I take this medicine? They need to know if you have any of these conditions: -heart disease -history of irregular heartbeat -liver disease -low levels of magnesium or potassium in the blood -an unusual or allergic reaction to ondansetron, granisetron, other medicines, foods, dyes, or preservatives -pregnant or trying to get pregnant -breast-feeding How should I use this medicine? Take this medicine by mouth with a glass of water. Follow the directions on your prescription label. Take your doses at regular intervals. Do not take your  medicine more often than directed. Talk to your pediatrician regarding the use of this medicine in children. Special care may be needed. Overdosage: If you think you have taken too much of this medicine contact a poison control center or emergency room at once. NOTE: This medicine is only for you. Do not share this medicine with others. What if I miss a dose? If you miss a dose, take it as soon as you can. If it is almost time for your next dose, take only that dose. Do not take double or extra doses. What may interact with this medicine? Do not take this medicine with any of the following medications: -apomorphine -certain medicines for fungal infections like fluconazole, itraconazole, ketoconazole, posaconazole, voriconazole -cisapride -dofetilide -dronedarone -pimozide -thioridazine -ziprasidone This medicine may also interact with the following medications: -carbamazepine -certain medicines for depression, anxiety, or psychotic disturbances -fentanyl -linezolid -MAOIs like Carbex, Eldepryl, Marplan, Nardil, and Parnate -methylene blue (injected into a vein) -other medicines that prolong the QT interval (cause an abnormal heart rhythm) -phenytoin -rifampicin -tramadol This list may not describe all possible interactions. Give your health care provider a list of all the medicines, herbs, non-prescription drugs, or dietary supplements you use. Also tell them if you smoke, drink alcohol, or use illegal drugs. Some items may interact with your medicine. What should I watch for while using this medicine? Check with your doctor or health care professional right away if you have any sign of an allergic reaction. What side effects may I notice from receiving this medicine? Side effects that you should report to your doctor or health care professional as soon as possible: -allergic reactions like skin rash, itching or hives, swelling of the face, lips or tongue -breathing  problems -confusion -dizziness -fast or irregular heartbeat -feeling faint  or lightheaded, falls -fever and chills -loss of balance or coordination -seizures -sweating -swelling of the hands or feet -tightness in the chest -tremors -unusually weak or tired Side effects that usually do not require medical attention (report to your doctor or health care professional if they continue or are bothersome): -constipation or diarrhea -headache This list may not describe all possible side effects. Call your doctor for medical advice about side effects. You may report side effects to FDA at 1-800-FDA-1088. Where should I keep my medicine? Keep out of the reach of children. Store between 2 and 30 degrees C (36 and 86 degrees F). Throw away any unused medicine after the expiration date. NOTE: This sheet is a summary. It may not cover all possible information. If you have questions about this medicine, talk to your doctor, pharmacist, or health care provider.    2016, [employer]. (2013-08-25 16:27:45) Prochlorperazine tablets What is this medicine? PROCHLORPERAZINE (proe klor PER a zeen) helps to control severe nausea and vomiting. This medicine is also used to treat schizophrenia. It can also help patients who experience anxiety that is not due to psychological illness. This medicine may be used for other purposes; ask your health care provider or pharmacist if you have questions. What should I tell my health care provider before I take this medicine? They need to know if you have any of these conditions: -blood disorders or disease -dementia -liver disease or jaundice -Parkinson's disease -uncontrollable movement disorder -an unusual or allergic reaction to prochlorperazine, other medicines, foods, dyes, or preservatives -pregnant or trying to get pregnant -breast-feeding How should I use this medicine? Take this medicine by mouth with a glass of water. Follow the directions  on the prescription label. Take your doses at regular intervals. Do not take your medicine more often than directed. Do not stop taking this medicine suddenly. This can cause nausea, vomiting, and dizziness. Ask your doctor or health care professional for advice. Talk to your pediatrician regarding the use of this medicine in children. Special care may be needed. While this drug may be prescribed for children as young as 2 years for selected conditions, precautions do apply. Overdosage: If you think you have taken too much of this medicine contact a poison control center or emergency room at once. NOTE: This medicine is only for you. Do not share this medicine with others. What if I miss a dose? If you miss a dose, take it as soon as you can. If it is almost time for your next dose, take only that dose. Do not take double or extra doses. What may interact with this medicine? Do not take this medicine with any of the following medications: -amoxapine -antidepressants like citalopram, escitalopram, fluoxetine, paroxetine, and sertraline -deferoxamine -dofetilide -maprotiline -tricyclic antidepressants like amitriptyline, clomipramine, imipramine, nortiptyline and others This medicine may also interact with the following medications: -lithium -medicines for pain -phenytoin -propranolol -warfarin This list may not describe all possible interactions. Give your health care provider a list of all the medicines, herbs, non-prescription drugs, or dietary supplements you use. Also tell them if you smoke, drink alcohol, or use illegal drugs. Some items may interact with your medicine. What should I watch for while using this medicine? Visit your doctor or health care professional for regular checks on your progress. You may get drowsy or dizzy. Do not drive, use machinery, or do anything that needs mental alertness until you know how this medicine affects you. Do not stand or sit up quickly, especially  if you are an older patient. This reduces the risk of dizzy or fainting spells. Alcohol may interfere with the effect of this medicine. Avoid alcoholic drinks. This medicine can reduce the response of your body to heat or cold. Dress warm in cold weather and stay hydrated in hot weather. If possible, avoid extreme temperatures like saunas, hot tubs, very hot or cold showers, or activities that can cause dehydration such as vigorous exercise. This medicine can make you more sensitive to the sun. Keep out of the sun. If you cannot avoid being in the sun, wear protective clothing and use sunscreen. Do not use sun lamps or tanning beds/booths. Your mouth may get dry. Chewing sugarless gum or sucking hard candy, and drinking plenty of water may help. Contact your doctor if the problem does not go away or is severe. What side effects may I notice from receiving this medicine? Side effects that you should report to your doctor or health care professional as soon as possible: -blurred vision -breast enlargement in men or women -breast milk in women who are not breast-feeding -chest pain, fast or irregular heartbeat -confusion, restlessness -dark yellow or brown urine -difficulty breathing or swallowing -dizziness or fainting spells -drooling, shaking, movement difficulty (shuffling walk) or rigidity -fever, chills, sore throat -involuntary or uncontrollable movements of the eyes, mouth, head, arms, and legs -seizures -stomach area pain -unusually weak or tired -unusual bleeding or bruising -yellowing of skin or eyes Side effects that usually do not require medical attention (report to your doctor or health care professional if they continue or are bothersome): -difficulty passing urine -difficulty sleeping -headache -sexual dysfunction -skin rash, or itching This list may not describe all possible side effects. Call your doctor for medical advice about side effects. You may report side effects to  FDA at 1-800-FDA-1088. Where should I keep my medicine? Keep out of the reach of children. Store at room temperature between 15 and 30 degrees C (59 and 86 degrees F). Protect from light. Throw away any unused medicine after the expiration date. NOTE: This sheet is a summary. It may not cover all possible information. If you have questions about this medicine, talk to your doctor, pharmacist, or health care provider.    2016, [employer]. (2012-04-07 16:59:39)

## 2016-01-09 ENCOUNTER — Ambulatory Visit: Payer: Medicare HMO | Admitting: Urology

## 2016-01-09 ENCOUNTER — Encounter (HOSPITAL_COMMUNITY): Payer: Medicare HMO

## 2016-01-09 DIAGNOSIS — C482 Malignant neoplasm of peritoneum, unspecified: Secondary | ICD-10-CM | POA: Diagnosis not present

## 2016-01-09 LAB — COMPREHENSIVE METABOLIC PANEL
ALK PHOS: 81 U/L (ref 38–126)
ALT: 8 U/L — ABNORMAL LOW (ref 14–54)
ANION GAP: 10 (ref 5–15)
AST: 17 U/L (ref 15–41)
Albumin: 3.8 g/dL (ref 3.5–5.0)
BILIRUBIN TOTAL: 0.4 mg/dL (ref 0.3–1.2)
BUN: 21 mg/dL — ABNORMAL HIGH (ref 6–20)
CALCIUM: 8.8 mg/dL — AB (ref 8.9–10.3)
CO2: 27 mmol/L (ref 22–32)
Chloride: 102 mmol/L (ref 101–111)
Creatinine, Ser: 1.27 mg/dL — ABNORMAL HIGH (ref 0.44–1.00)
GFR calc non Af Amer: 39 mL/min — ABNORMAL LOW (ref >60)
GFR, EST AFRICAN AMERICAN: 45 mL/min — AB (ref >60)
Glucose, Bld: 123 mg/dL — ABNORMAL HIGH (ref 65–99)
POTASSIUM: 3.8 mmol/L (ref 3.5–5.1)
SODIUM: 139 mmol/L (ref 135–145)
TOTAL PROTEIN: 7.5 g/dL (ref 6.5–8.1)

## 2016-01-09 LAB — CBC WITH DIFFERENTIAL/PLATELET
BASOS PCT: 0 %
Basophils Absolute: 0 10*3/uL (ref 0.0–0.1)
EOS ABS: 0.3 10*3/uL (ref 0.0–0.7)
Eosinophils Relative: 3 %
HCT: 39.8 % (ref 36.0–46.0)
HEMOGLOBIN: 12 g/dL (ref 12.0–15.0)
LYMPHS ABS: 2 10*3/uL (ref 0.7–4.0)
Lymphocytes Relative: 19 %
MCH: 23.7 pg — AB (ref 26.0–34.0)
MCHC: 30.2 g/dL (ref 30.0–36.0)
MCV: 78.5 fL (ref 78.0–100.0)
MONO ABS: 0.8 10*3/uL (ref 0.1–1.0)
MONOS PCT: 8 %
NEUTROS PCT: 70 %
Neutro Abs: 7.3 10*3/uL (ref 1.7–7.7)
Platelets: 251 10*3/uL (ref 150–400)
RBC: 5.07 MIL/uL (ref 3.87–5.11)
RDW: 17.3 % — AB (ref 11.5–15.5)
WBC: 10.4 10*3/uL (ref 4.0–10.5)

## 2016-01-09 MED ORDER — MAGIC MOUTHWASH W/LIDOCAINE: 5.0000 mL | Freq: Three times a day (TID) | ORAL | Status: AC

## 2016-01-09 NOTE — Progress Notes (Unsigned)
Chemo teaching done and consent signed for Abraxane, Carboplatin, Avastin. Calendar given to patient and son. Distress screening done.   [name] presented for labwork. Labs per MD order drawn via Peripheral Line 23 gauge needle inserted in LT AC area Good blood return present. Procedure without incident.  Needle removed intact. Patient tolerated procedure well.

## 2016-01-10 ENCOUNTER — Other Ambulatory Visit (HOSPITAL_COMMUNITY): Payer: Medicare HMO

## 2016-01-10 ENCOUNTER — Ambulatory Visit (HOSPITAL_COMMUNITY): Payer: Medicare HMO | Admitting: Hematology & Oncology

## 2016-01-11 ENCOUNTER — Ambulatory Visit (HOSPITAL_COMMUNITY): Payer: Medicare HMO

## 2016-01-11 ENCOUNTER — Telehealth (HOSPITAL_COMMUNITY): Payer: Self-pay | Admitting: Oncology

## 2016-01-11 ENCOUNTER — Encounter (HOSPITAL_BASED_OUTPATIENT_CLINIC_OR_DEPARTMENT_OTHER): Payer: Medicare HMO | Admitting: Oncology

## 2016-01-11 ENCOUNTER — Encounter (HOSPITAL_COMMUNITY): Payer: Self-pay | Admitting: Oncology

## 2016-01-11 ENCOUNTER — Encounter (HOSPITAL_BASED_OUTPATIENT_CLINIC_OR_DEPARTMENT_OTHER): Payer: Medicare HMO

## 2016-01-11 ENCOUNTER — Telehealth (HOSPITAL_COMMUNITY): Payer: Self-pay | Admitting: Hematology & Oncology

## 2016-01-11 VITALS — BP 110/61 | HR 72 | Temp 97.7°F | Resp 16 | Wt 179.8 lb

## 2016-01-11 DIAGNOSIS — C569 Malignant neoplasm of unspecified ovary: Secondary | ICD-10-CM

## 2016-01-11 DIAGNOSIS — Z5112 Encounter for antineoplastic immunotherapy: Secondary | ICD-10-CM | POA: Diagnosis not present

## 2016-01-11 DIAGNOSIS — Z5111 Encounter for antineoplastic chemotherapy: Secondary | ICD-10-CM

## 2016-01-11 DIAGNOSIS — C482 Malignant neoplasm of peritoneum, unspecified: Secondary | ICD-10-CM

## 2016-01-11 MED ORDER — PALONOSETRON HCL INJECTION 0.25 MG/5ML
0.2500 mg | Freq: Once | INTRAVENOUS | Status: AC
  Administered 2016-01-11: 0.25 mg via INTRAVENOUS
  Filled 2016-01-11: qty 5

## 2016-01-11 MED ORDER — PACLITAXEL PROTEIN-BOUND CHEMO INJECTION 100 MG
80.0000 mg/m2 | Freq: Once | INTRAVENOUS | Status: AC
  Administered 2016-01-11: 150 mg via INTRAVENOUS
  Filled 2016-01-11: qty 30

## 2016-01-11 MED ORDER — SODIUM CHLORIDE 0.9% FLUSH
10.0000 mL | INTRAVENOUS | Status: DC | PRN
  Administered 2016-01-11: 10 mL
  Filled 2016-01-11: qty 10

## 2016-01-11 MED ORDER — SODIUM CHLORIDE 0.9 % IV SOLN
Freq: Once | INTRAVENOUS | Status: AC
  Administered 2016-01-11: 11:00:00 via INTRAVENOUS
  Filled 2016-01-11: qty 5

## 2016-01-11 MED ORDER — SODIUM CHLORIDE 0.9 % IV SOLN
Freq: Once | INTRAVENOUS | Status: AC
  Administered 2016-01-11: 10:00:00 via INTRAVENOUS

## 2016-01-11 MED ORDER — CARBOPLATIN CHEMO INJECTION 450 MG/45ML
144.2000 mg | Freq: Once | INTRAVENOUS | Status: AC
  Administered 2016-01-11: 140 mg via INTRAVENOUS
  Filled 2016-01-11: qty 14

## 2016-01-11 MED ORDER — SODIUM CHLORIDE 0.9 % IV SOLN
15.0000 mg/kg | Freq: Once | INTRAVENOUS | Status: AC
  Administered 2016-01-11: 1250 mg via INTRAVENOUS
  Filled 2016-01-11: qty 4

## 2016-01-11 MED ORDER — HEPARIN SOD (PORK) LOCK FLUSH 100 UNIT/ML IV SOLN
500.0000 [IU] | Freq: Once | INTRAVENOUS | Status: AC | PRN
  Administered 2016-01-11: 500 [IU]
  Filled 2016-01-11: qty 5

## 2016-01-11 NOTE — Progress Notes (Signed)
Tolerated chemo well. 

## 2016-01-11 NOTE — Progress Notes (Signed)
Sarah Bogus, MD [address] [place] [address]  Peritoneal carcinoma [facility])  Ovarian cancer, unspecified laterality ([place]) - Plan: CA 125  Primary peritoneal carcinomatosis ([place])  CURRENT THERAPY: Carboplatin/Abraxane days 1, 8, 15 every 21 days beginning on 01/11/2016  INTERVAL HISTORY: [name] 81 y.o. female returns for followup of recurrent peritoneal carcinomatosis.    Primary peritoneal carcinomatosis ([place])   07/25/2014 Pathology Results Left pleural effusion positive for malignant cell consistent with adenocarcinoma.   07/27/2014 - 08/05/2014 Hospital Admission Heart failure with malignant pleural effusion   07/28/2014 Pathology Results Left pleural effusion positive for malignant cells consistent with adenocarcinoma (CK7 pos, CK20 neg, WT-1 pos, ER weakly pos, PR neg, TTF-1 neg, GCDFP-15 neg).  Findings favor gyn/ovarian primary.   08/01/2014 Pathology Results [facility]- Postive genomic findings: BRCA2 [id number], STK11 loss, MYC amplification-equivocal, TP53 R273C, MLL2 [id number].  Targeted treatments include Olaparib (BRCA2) and Everolimus/Temsirolimus (STK11).   08/03/2014 Tumor Marker CA 125 4954   08/04/2014 Procedure Left pleurX catheter by Dr. VLucianne LeiTright   09/08/2014 - 09/14/2014 Hospital Admission Hospitalized with the following main issues: acute on chronic CHF, acute on chronic respiratory failure with hypoxia, and malignant plueral effusion on right    09/13/2014 Procedure Right PleurX catheter by Dr. BCyndia Bent  09/13/2014 Pathology Results Right pleural effusion positive for malignant adenocarcinoma   09/21/2014 Tumor Marker CA 125 3869   09/22/2014 - 11/17/2014 Chemotherapy Carboplatin/Paclitaxel days 1, 8, 15 every 28 days.   10/20/2014 Tumor Marker CA 125 335   11/23/2014 - 02/23/2015 Chemotherapy Carboplatin/Abraxane day 1, 8, 15 every 28 days.   06/06/2015 Tumor Marker CA 125: 21.7    08/04/2015 Tumor Marker CA 125: 23.6    08/17/2015  Imaging CT abd/pelvis- Resolution of ascites and pleural effusions since previous study. Near complete resolution of soft tissue stranding and nodularity in the omental and mesenteric fat.  Resolution of left inguinal lymphadenopathy since prior exam.   10/10/2015 Tumor Marker CA 125: 47.4 (H)    11/30/2015 Tumor Marker CA 125: 101.2 (H)   12/25/2015 Tumor Marker CA 125: 118.4 (H)    12/29/2015 Progression    12/29/2015 Imaging CT CAP- progression of peritoneal carcinomatosis, with interval increase in number and size of numerous large lesions predominantly in the low anatomic pelvis   01/11/2016 -  Chemotherapy Carboplatin/Abraxane days 1, 8, 15 every 21 days.    I personally reviewed and went over laboratory results with the patient.  The results are noted within this dictation.  Labs meet treatment parameters today.  She is here for day 1 of treatment.  I had to perform a peer-to-peer with her insurance company today as they denied her Abraxane.  It is approved as she had anaphylaxis with Paclitaxel, thought to be from Cremophor.   She was switched to Abraxane in December 2015 with no issues with intolerance.   She notes back pain only and she has an appointment to be seen at Neurosurgery upcoming.   Past Medical History  Diagnosis Date  . GERD (gastroesophageal reflux disease)   . Osteoarthritis   . Peripheral edema   . CHF (congestive heart failure), NYHA class IV (HCC)     diastolic  . UTI (lower urinary tract infection)   . Cancer ([place]     has GERD; CONSTIPATION, CHRONIC; HEMATOCHEZIA; OSTEOARTHRITIS, LOWER LEG; KNEE PAIN; BURSITIS, KNEE; Dysphagia; Malignant pleural effusion; Edema of both legs; Heart failure, diastolic, with acute decompensation ([place]; Hyperkalemia; Anemia,  unspecified; Rectal bleed; Chest pain; Protein-calorie malnutrition, severe ([place]); Dysphagia, pharyngoesophageal phase; Constipation; Acute on chronic congestive heart failure ([place]); Acute on chronic respiratory  failure with hypoxia ([place]); Primary peritoneal carcinomatosis ([place]); Anemia due to chemotherapy; Cellulitis; and Genetic testing on her problem list.     is allergic to penicillins.  Current Outpatient Prescriptions on File Prior to Visit  Medication Sig Dispense Refill  . Bevacizumab (AVASTIN IV) Inject into the vein. To be given Day 1 every 21 days    . CARBOPLATIN IV Inject into the vein. Days 1, 8, 15 every 21 days    . cephALEXin (KEFLEX) 500 MG capsule Take 1 capsule (500 mg total) by mouth 4 (four) times daily. 28 capsule 0  . ciprofloxacin (CIPRO) 250 MG tablet Take 1 tablet (250 mg total) by mouth 2 (two) times daily. 14 tablet 0  . ciprofloxacin (CIPRO) 500 MG tablet 500 mg every 12 hours x 7 days. 14 tablet 0  . docusate sodium (COLACE) 100 MG capsule Take 100 mg by mouth 2 (two) times daily.    . furosemide (LASIX) 20 MG tablet Take 20 mg by mouth daily.    Marland Kitchen gabapentin (NEURONTIN) 300 MG capsule Take one capsule at night for 5 days then, increase to two capsules thereafter if tolerated 60 capsule 2  . KLOR-CON M10 10 MEQ tablet Take 20 mEq by mouth 3 (three) times daily.     Marland Kitchen lactulose (CHRONULAC) 10 GM/15ML solution Take 30 mLs (20 g total) by mouth 2 (two) times daily as needed for moderate constipation. 240 mL 0  . lidocaine-prilocaine (EMLA) cream Apply a quarter size amount to port site 1 hour prior to chemo. Do not rub in. Cover with plastic wrap. 30 g 3  . magic mouthwash w/lidocaine SOLN Take 5 mLs by mouth 4 (four) times daily -  before meals and at bedtime. 360 mL 0  . metoCLOPramide (REGLAN) 5 MG tablet Take 5 mg by mouth 4 (four) times daily.    . mometasone (ELOCON) 0.1 % cream Apply 1 application topically daily. To affected areas (arms and hands)    . Nutritional Supplements (ENSURE CLEAR) LIQD Take 1 Bottle by mouth 3 (three) times daily.    . ondansetron (ZOFRAN) 8 MG tablet Take 1 tablet (8 mg total) by mouth every 8 (eight) hours as needed for nausea or vomiting.  30 tablet 2  . oxyCODONE (ROXICODONE) 5 MG immediate release tablet Take two tablet by mouth every 4 hours as needed for pain (Patient taking differently: Take 10 mg by mouth every 4 (four) hours as needed for moderate pain or severe pain. ) 90 tablet 0  . PACLitaxel Protein-Bound Part (ABRAXANE IV) Inject into the vein. Days 1, 8, 15 every 21 days    . pantoprazole (PROTONIX) 40 MG tablet Take 1 tablet (40 mg total) by mouth daily. 30 tablet 0  . potassium chloride 20 MEQ TBCR Take 20 mEq by mouth daily. 7 tablet 0  . predniSONE (DELTASONE) 10 MG tablet Take 1 tablet (10 mg total) by mouth daily with breakfast. 30 tablet 1  . prochlorperazine (COMPAZINE) 10 MG tablet Take 1 tablet (10 mg total) by mouth every 6 (six) hours as needed for nausea or vomiting. 30 tablet 2   Current Facility-Administered Medications on File Prior to Visit  Medication Dose Route Frequency Provider Last Rate Last Dose  . bevacizumab (AVASTIN) 1,250 mg in sodium chloride 0.9 % 100 mL chemo infusion  15 mg/kg (Treatment Plan Actual)  Intravenous Once Patrici Ranks, MD 300 mL/hr at 01/11/16 1216 1,250 mg at 01/11/16 1216  . CARBOplatin (PARAPLATIN) 140 mg in sodium chloride 0.9 % 100 mL chemo infusion  140 mg Intravenous Once Patrici Ranks, MD      . heparin lock flush 100 unit/mL  500 Units Intracatheter Once PRN Patrici Ranks, MD      . PACLitaxel-protein bound (ABRAXANE) chemo infusion 150 mg  80 mg/m2 (Treatment Plan Actual) Intravenous Once Patrici Ranks, MD      . sodium chloride flush (NS) 0.9 % injection 10 mL  10 mL Intracatheter PRN Patrici Ranks, MD   10 mL at 01/11/16 1013    Past Surgical History  Procedure Laterality Date  . Cholecystectomy    . Cardiac catheterization    . Total abdominal hysterectomy  40 years ago    patient does not know if ovaries were removed  . Appendectomy    . Total knee arthroplasty Left   . Chest tube insertion Left 08/04/2014    Procedure: INSERTION PLEURAL  DRAINAGE CATHETER;  Surgeon: Ivin Poot, MD;  Location: [facility];  Service: Thoracic;  Laterality: Left;  . Chest tube insertion Right 09/13/2014    Procedure: INSERTION PLEURAL DRAINAGE CATHETER;  Surgeon: Gaye Pollack, MD;  Location: [facility];  Service: Thoracic;  Laterality: Right;  . Removal of pleural drainage catheter Bilateral 12/06/2014    Procedure: REMOVAL OF PLEURAL DRAINAGE CATHETER;  Surgeon: Ivin Poot, MD;  Location: [facility];  Service: Thoracic;  Laterality: Bilateral;    Denies any headaches, dizziness, double vision, fevers, chills, night sweats, nausea, vomiting, diarrhea, constipation, chest pain, heart palpitations, shortness of breath, blood in stool, black tarry stool, urinary pain, urinary burning, urinary frequency, hematuria.   PHYSICAL EXAMINATION  ECOG PERFORMANCE STATUS: 1 - Symptomatic but completely ambulatory  There were no vitals filed for this visit.  GENERAL:alert, well nourished, well developed, comfortable, cooperative, smiling and accompanied by her granddaughter and husband. SKIN: skin color, texture, turgor are normal, no rashes or significant lesions HEAD: Normocephalic, No masses, lesions, tenderness or abnormalities EYES: normal, PERRLA, EOMI, Conjunctiva are pink and non-injected EARS: External ears normal OROPHARYNX:lips, buccal mucosa, and tongue normal and mucous membranes are moist  NECK: supple, trachea midline LYMPH:  not examined BREAST:not examined LUNGS: not examined HEART: not examined ABDOMEN:abdomen soft and normal bowel sounds BACK: Back symmetric, no curvature. EXTREMITIES:less then 2 second capillary refill, no joint deformities, effusion, or inflammation, no skin discoloration  NEURO: alert & oriented x 3 with fluent speech, no focal motor/sensory deficits, gait normal   LABORATORY DATA: CBC    Component Value Date/Time   WBC 10.4 01/09/2016 1720   RBC 5.07 01/09/2016 1720   RBC 4.29 07/27/2014 2257   HGB 12.0  01/09/2016 1720   HCT 39.8 01/09/2016 1720   PLT 251 01/09/2016 1720   MCV 78.5 01/09/2016 1720   MCH 23.7* 01/09/2016 1720   MCHC 30.2 01/09/2016 1720   RDW 17.3* 01/09/2016 1720   LYMPHSABS 2.0 01/09/2016 1720   MONOABS 0.8 01/09/2016 1720   EOSABS 0.3 01/09/2016 1720   BASOSABS 0.0 01/09/2016 1720      Chemistry      Component Value Date/Time   NA 139 01/09/2016 1720   K 3.8 01/09/2016 1720   CL 102 01/09/2016 1720   CO2 27 01/09/2016 1720   BUN 21* 01/09/2016 1720   CREATININE 1.27* 01/09/2016 1720      Component Value Date/Time  CALCIUM 8.8* 01/09/2016 1720   ALKPHOS 81 01/09/2016 1720   AST 17 01/09/2016 1720   ALT 8* 01/09/2016 1720   BILITOT 0.4 01/09/2016 1720      Lab Results  Component Value Date   CA125 118.4* 12/25/2015     PENDING LABS:   RADIOGRAPHIC STUDIES:  Ct Chest W Contrast  12/29/2015  CLINICAL DATA:  81 year old female with history of primary peritoneal carcinomatosis (adenocarcinoma) status post chemotherapy (last treatment 2 weeks ago). Shortness of breath and cough for the past year. Nausea for the past year. EXAM: CT CHEST, ABDOMEN, AND PELVIS WITH CONTRAST TECHNIQUE: Multidetector CT imaging of the chest, abdomen and pelvis was performed following the standard protocol during bolus administration of intravenous contrast. CONTRAST:  171m OMNIPAQUE IOHEXOL 300 MG/ML  SOLN COMPARISON:  CT the abdomen and pelvis 08/17/2015. CT of the chest, abdomen and pelvis 07/29/2014. Multiple other prior examinations. FINDINGS: CT CHEST FINDINGS Mediastinum/Lymph Nodes: Heart size is normal. There is no significant pericardial fluid, thickening or pericardial calcification. There is atherosclerosis of the thoracic aorta, the great vessels of the mediastinum and the coronary arteries, including calcified atherosclerotic plaque in the left main, left anterior descending, left circumflex and right coronary arteries. No pathologically enlarged mediastinal or hilar  lymph nodes. Large hiatal hernia. Right internal jugular single-lumen porta cath with tip terminating in the distal superior vena cava. No axillary lymphadenopathy. Lungs/Pleura: Tiny 2 mm subpleural nodule in the periphery of the right middle lobe (image 34 of series 7), unchanged compared to prior study 07/29/2014, considered benign, presumably a subpleural lymph node. No other larger more suspicious appearing pulmonary nodules or masses are identified at this time. Areas of linear scarring are noted throughout the lung bases bilaterally. No acute consolidative airspace disease. No pleural effusions. Musculoskeletal/Soft Tissues: There are no aggressive appearing lytic or blastic lesions noted in the visualized portions of the skeleton. CT ABDOMEN AND PELVIS FINDINGS Hepatobiliary: No suspicious appearing cystic or solid hepatic lesions. Status post cholecystectomy. Mild intrahepatic biliary ductal dilatation is unchanged, as is mild extrahepatic biliary ductal dilatation (common bile duct measures up to 12 mm in the porta hepatis), presumably reflective of benign post cholecystectomy physiology. Pancreas: No pancreatic mass. No pancreatic ductal dilatation. No pancreatic or peripancreatic fluid or inflammatory changes. Spleen: Unremarkable. Adrenals/Urinary Tract: Bilateral adrenal glands are normal in appearance. Multifocal cortical thinning throughout the kidneys bilaterally, in addition to mild bilateral renal atrophy. No suspicious renal lesions. No hydroureteronephrosis. Urinary bladder is partially decompressed, but unremarkable in appearance. Stomach/Bowel: Intraabdominal portion of the stomach is normal. No pathologic dilatation of small bowel or colon. Extensive colonic diverticulosis is noted, most severe throughout the descending and sigmoid colon, without surrounding inflammatory changes to suggest an acute diverticulitis at this time. Vascular/Lymphatic: Atherosclerotic calcifications throughout the  abdominal and pelvic vasculature, without evidence of aneurysm or dissection. No lymphadenopathy noted in the abdomen or pelvis. Reproductive: Status post total abdominal hysterectomy. Ovaries are not confidently identified and may be surgically absent or atrophic. Other: Compared to the prior examination from 08/17/2015 the abnormal fluid collections and soft tissue masses in the low anatomic pelvis have significantly increased in size. The largest of these lesions now include a 6.0 x 4.8 cm heterogeneously enhancing multi-cystic lesion along the right pelvic sidewall (image 102 of series 2), and a similar appearing lesion measuring 4.2 x 7.1 cm immediately inferior to the proximal sigmoid colon (image 106 of series 2), which is inseparable from the colon on coronal and sagittal reformatted images. Several  other smaller collections are also noted in the pelvis, all of which are larger than the prior examination. Some slight haziness is also noted in the omentum, best appreciated to the left of midline on images 71 and 73 of series 2, which could represent early omental involvement. No significant volume of free flowing ascites. No pneumoperitoneum. Musculoskeletal: There are no aggressive appearing lytic or blastic lesions noted in the visualized portions of the skeleton. IMPRESSION: 1. Today's study demonstrates progression of peritoneal carcinomatosis, with interval increase in number and size of numerous large lesions predominantly in the low anatomic pelvis, as detailed above. 2. No evidence of thoracic metastatic disease. 3. Atherosclerosis, including left main and 3 vessel coronary artery disease. Assessment for potential risk factor modification, dietary therapy or pharmacologic therapy may be warranted, if clinically indicated. 4. Large hiatal hernia. 5. Additional incidental findings, similar prior studies, as above. Electronically Signed   By: Vinnie Langton M.D.   On: 12/29/2015 16:04   Ct Abdomen  Pelvis W Contrast  12/29/2015  CLINICAL DATA:  81 year old female with history of primary peritoneal carcinomatosis (adenocarcinoma) status post chemotherapy (last treatment 2 weeks ago). Shortness of breath and cough for the past year. Nausea for the past year. EXAM: CT CHEST, ABDOMEN, AND PELVIS WITH CONTRAST TECHNIQUE: Multidetector CT imaging of the chest, abdomen and pelvis was performed following the standard protocol during bolus administration of intravenous contrast. CONTRAST:  155m OMNIPAQUE IOHEXOL 300 MG/ML  SOLN COMPARISON:  CT the abdomen and pelvis 08/17/2015. CT of the chest, abdomen and pelvis 07/29/2014. Multiple other prior examinations. FINDINGS: CT CHEST FINDINGS Mediastinum/Lymph Nodes: Heart size is normal. There is no significant pericardial fluid, thickening or pericardial calcification. There is atherosclerosis of the thoracic aorta, the great vessels of the mediastinum and the coronary arteries, including calcified atherosclerotic plaque in the left main, left anterior descending, left circumflex and right coronary arteries. No pathologically enlarged mediastinal or hilar lymph nodes. Large hiatal hernia. Right internal jugular single-lumen porta cath with tip terminating in the distal superior vena cava. No axillary lymphadenopathy. Lungs/Pleura: Tiny 2 mm subpleural nodule in the periphery of the right middle lobe (image 34 of series 7), unchanged compared to prior study 07/29/2014, considered benign, presumably a subpleural lymph node. No other larger more suspicious appearing pulmonary nodules or masses are identified at this time. Areas of linear scarring are noted throughout the lung bases bilaterally. No acute consolidative airspace disease. No pleural effusions. Musculoskeletal/Soft Tissues: There are no aggressive appearing lytic or blastic lesions noted in the visualized portions of the skeleton. CT ABDOMEN AND PELVIS FINDINGS Hepatobiliary: No suspicious appearing cystic or  solid hepatic lesions. Status post cholecystectomy. Mild intrahepatic biliary ductal dilatation is unchanged, as is mild extrahepatic biliary ductal dilatation (common bile duct measures up to 12 mm in the porta hepatis), presumably reflective of benign post cholecystectomy physiology. Pancreas: No pancreatic mass. No pancreatic ductal dilatation. No pancreatic or peripancreatic fluid or inflammatory changes. Spleen: Unremarkable. Adrenals/Urinary Tract: Bilateral adrenal glands are normal in appearance. Multifocal cortical thinning throughout the kidneys bilaterally, in addition to mild bilateral renal atrophy. No suspicious renal lesions. No hydroureteronephrosis. Urinary bladder is partially decompressed, but unremarkable in appearance. Stomach/Bowel: Intraabdominal portion of the stomach is normal. No pathologic dilatation of small bowel or colon. Extensive colonic diverticulosis is noted, most severe throughout the descending and sigmoid colon, without surrounding inflammatory changes to suggest an acute diverticulitis at this time. Vascular/Lymphatic: Atherosclerotic calcifications throughout the abdominal and pelvic vasculature, without evidence of aneurysm or  dissection. No lymphadenopathy noted in the abdomen or pelvis. Reproductive: Status post total abdominal hysterectomy. Ovaries are not confidently identified and may be surgically absent or atrophic. Other: Compared to the prior examination from 08/17/2015 the abnormal fluid collections and soft tissue masses in the low anatomic pelvis have significantly increased in size. The largest of these lesions now include a 6.0 x 4.8 cm heterogeneously enhancing multi-cystic lesion along the right pelvic sidewall (image 102 of series 2), and a similar appearing lesion measuring 4.2 x 7.1 cm immediately inferior to the proximal sigmoid colon (image 106 of series 2), which is inseparable from the colon on coronal and sagittal reformatted images. Several other  smaller collections are also noted in the pelvis, all of which are larger than the prior examination. Some slight haziness is also noted in the omentum, best appreciated to the left of midline on images 71 and 73 of series 2, which could represent early omental involvement. No significant volume of free flowing ascites. No pneumoperitoneum. Musculoskeletal: There are no aggressive appearing lytic or blastic lesions noted in the visualized portions of the skeleton. IMPRESSION: 1. Today's study demonstrates progression of peritoneal carcinomatosis, with interval increase in number and size of numerous large lesions predominantly in the low anatomic pelvis, as detailed above. 2. No evidence of thoracic metastatic disease. 3. Atherosclerosis, including left main and 3 vessel coronary artery disease. Assessment for potential risk factor modification, dietary therapy or pharmacologic therapy may be warranted, if clinically indicated. 4. Large hiatal hernia. 5. Additional incidental findings, similar prior studies, as above. Electronically Signed   By: Vinnie Langton M.D.   On: 12/29/2015 16:04     PATHOLOGY:    ASSESSMENT AND PLAN:  Primary peritoneal carcinomatosis ([place]) Recurrent peritoneal carcinomatosis after initially being treated with Carboplatin/Paclitaxel beginning on 03/55/9741 complicated by Cremophor reaction resulting in a change in therapy on 11/23/2014 to Carboplatin/Abraxane finishing on 02/23/2015.  Now with recurrent disease and increasing tumor marker.  She began Carboplatin/Abraxane therapy again in the recurrent setting on 01/11/2016 given that she was last treated with this regimen 11 months prior (thus a re-challenge is indicated).  Oncology history updated.  I completed a peer-to-peer discussion with Dr. Truddie Coco who is associated with her insurance company as Abraxane was denied.  Discussed previously with Angie who reported that when she first got her regimen approved, the patient's  insurance carrier only needed approval for [facility] pre-med.  Angie was subsequently contacted recently from the insurance company which reported that the patient's entire treatment plan required insurance approval.  This paperwork was completed and the patient's insurance refused approval of Abraxane.  Today, the patient's Abraxane was approved via my discussion with Dr. Truddie Coco.  The approval authorization is good for 3 months and the number is dictated within the patient's medical chart as a "telephone encounter."  She notes back pain that is well controlled with transdermal lidocaine.  She has an upcoming appointment with Neurosurgery. MRI from July 2016 of L-spine demonstrated a neural foraminal stenosis at L4-L5.  Pre-treatment labs today meet parameters for chemotherapy today.  Return as scheduled for follow-up next week with treatment.     THERAPY PLAN:  Continue with systemic chemotherapy consisting of Carboplatin/Abraxane.  All questions were answered. The patient knows to call the clinic with any problems, questions or concerns. We can certainly see the patient much sooner if necessary.  Patient and plan discussed with Dr. Ancil Linsey and she is in agreement with the aforementioned.   This note is  electronically signed by: Doy Mince 01/11/2016 1:36 PM

## 2016-01-11 NOTE — Telephone Encounter (Signed)
On 01/10/16 I rcvd a call from [employer] stating they gave me wrong info regarding the Emend auth. Auth request are handled by [facility] Contacted them and found out that not only the [place] but all of the pts chemo drugs require auth! Submitted the auth by phone and faxed over records. I recvd a vm from Dr Truddie Coco [phone] last night around 7:30pm requesting documentation of a hypersensitivity to abraxane because paclitaxol is indicated  For the tx, a peer to peer or withdraw the request for abraxane.Spoke with Dr Whitney Muse and she agreed to a peer to peer. Returned call to Dr Truddie Coco no answer left vm

## 2016-01-11 NOTE — Telephone Encounter (Signed)
Peer-to-peer completed with Dr. Truddie Coco [phone]).  Abraxane is approved beginning today and for 3 months: [id number].  Venisha Boehning, PA-C 01/11/2016 10:48 AM

## 2016-01-11 NOTE — Patient Instructions (Addendum)
[place] at [facility] Discharge Instructions  RECOMMENDATIONS MADE BY THE CONSULTANT AND ANY TEST RESULTS WILL BE SENT TO YOUR REFERRING PHYSICIAN  Exam done and seen by Kirby Crigler PA-C Got treated today Continue as scheduled  Thank you for choosing [place] at [facility] to provide your oncology and hematology care.  To afford each patient quality time with our provider, please arrive at least 15 minutes before your scheduled appointment time.   Beginning January 23rd 2017 lab work for the [facility] will be done in the  Main lab at [facility] on 1st floor. If you have a lab appointment with the [facility] please come in thru the  Main Entrance and check in at the main information desk  You need to re-schedule your appointment should you arrive 10 or more minutes late.  We strive to give you quality time with our providers, and arriving late affects you and other patients whose appointments are after yours.  Also, if you no show three or more times for appointments you may be dismissed from the clinic at the providers discretion.     Again, thank you for choosing [facility].  Our hope is that these requests will decrease the amount of time that you wait before being seen by our physicians.       _____________________________________________________________  Should you have questions after your visit to [facility], please contact our office at [phone] between the hours of 8:30 a.m. and 4:30 p.m.  Voicemails left after 4:30 p.m. will not be returned until the following business day.  For prescription refill requests, have your pharmacy contact our office.   Marland Kitchen.What to Know After Chemo What am I supposed to do? . Live your Life!!!!! Continue to do your normal routine or activities of daily living.  . You may feel more tired than usual as your treatments progress, expect that and plan for it. What  should I eat? . Fresh fruits and vegetables that have been thoroughly cleaned but NO grapefruit. . Thoroughly cook your food - no hot pink center or pink/red juices. . No raw or undercooked eggs/meat. . Expect that your taste buds will change. Foods/drinks are going to taste different.  Patsi Sears has Dieticians available to help with meal ideas and planning. What do I do if I feel sick to my stomach or throw up?  Marland Kitchen Feeling sick to your stomach is called feeling "nauseated."  You will see "nausea" written on your prescriptions and discharge instructions. . Take your nausea medications as directed on bottle at the 1st sign of feeling sick.  DO NOT WAIT. . If you throw up more than once, contact the [place].   . If it is the weekend, please go to the nearest Emergency Room or Urgent [place]. What do I do if I cannot have a bowel movement (constipated) or have diarrhea?  . If you can't have a bowel movement (constipated), these medications can be bought over-the-counter at drug stores or [facility].  o Colace (docusate sodium) 100mg  capsule. May take 1-6 capsules everyday as needed to soften stool. o Senna-S or Senokot (laxative). May take 1-4 tablets daily as needed for mild constipation.  o Milk of Magnesia.  (laxative for moderate - severe constipation) You may take up to 8 tablespoons in a 24 hour period. Start with 1 tablespoon (28ml) of Milk of Magnesia. If no BM within  6-8 hours, take 2 tablespoons (41ml). If no BM, increase to 3 tablespoons (31ml). If no BM, contact the [place].  o If your bowels get loose (diarrhea) take Imodium. Take Imodium 2 tablets after the 1st loose stool, and then 1 tablet every 2 hours until 12 hours have passed without a bowel movement. May take 2 tablets @ bedtime and every 4 hours until morning. If diarrhea recurs repeat. o If the Imodium does not slow down or stop your diarrhea - call the [place].  Do not let this continue for more  than 24 hours. Diarrhea/loose stools can lead to dehydration.    What do I do if I get fever and/or chills? . Check your temperature daily to monitor for fever or infection. . If temperature is greater than or equal to 100.5 or you are experiencing chills call the [place]. If the [place] is closed, report to the nearest emergency room or urgent care. NEVER IGNORE A FEVER! THIS IS FOR YOUR SAFETY! What do I do if I get mouth sores or have pain in my mouth? . Call the [place] and speak to a nurse. . If it is the weekend or after hours, rinse your mouth with salt water swishes, gargles, and spits at least 3 times a day - then call us Monday morning.   . DO NOT eat citrus or spicy foods, DO NOT rinse your mouth with alcohol based mouthwashes, & DO NOT ignore mouth pain.   What do I do if I get green or yellow mucus production or a congested cough? . Call the [place] and speak to a nurse.  . If it is after hours or the weekend and these symptoms are making it hard for you to breathe, and/or you also have a fever --- go to the emergency room or an urgent care center. What do I do if I begin burning when I urinate? . Call the [place] and speak to a nurse. If it is after hours or the weekend and along with these symptoms you also have a fever, go to the emergency room or urgent care center. (Caregivers of patients receiving chemo --- In older patients, a sign of a urinary tract infection can be confusion and not necessarily a fever.) What do I do if I have shortness of breath? . You may feel tired or feel like you get "winded" more easily than usual after your chemotherapy treatments.  You should never feel like you cannot breathe. . If you are panting, lips turning blue or purple, feeling dizzy or light headed, this is NOT normal. You need to get medical help immediately.  Dial 911.  This information is intended to be a supplement to the in depth review that you had during  chemotherapy teaching with our Nurse Navigator.  As always, if you have questions call the [place] and speak with a nurse.  [place] Phone Numbers: Hours of Operation: Monday - Friday 8:30-4:30, Closed for [place] Number:  [phone]   Nurse Navigator:  [phone]   [place]: [phone]

## 2016-01-11 NOTE — Assessment & Plan Note (Signed)
Recurrent peritoneal carcinomatosis after initially being treated with Carboplatin/Paclitaxel beginning on 99991111 complicated by Cremophor reaction resulting in a change in therapy on 11/23/2014 to Carboplatin/Abraxane finishing on 02/23/2015.  Now with recurrent disease and increasing tumor marker.  She began Carboplatin/Abraxane therapy again in the recurrent setting on 01/11/2016 given that she was last treated with this regimen 11 months prior (thus a re-challenge is indicated).  Oncology history updated.  I completed a peer-to-peer discussion with Dr. Truddie Coco who is associated with her insurance company as Abraxane was denied.  Discussed previously with Angie who reported that when she first got her regimen approved, the patient's insurance carrier only needed approval for [place] pre-med.  Angie was subsequently contacted recently from the insurance company which reported that the patient's entire treatment plan required insurance approval.  This paperwork was completed and the patient's insurance refused approval of Abraxane.  Today, the patient's Abraxane was approved via my discussion with Dr. Truddie Coco.  The approval authorization is good for 3 months and the number is dictated within the patient's medical chart as a "telephone encounter."  She notes back pain that is well controlled with transdermal lidocaine.  She has an upcoming appointment with Neurosurgery. MRI from July 2016 of L-spine demonstrated a neural foraminal stenosis at L4-L5.  Pre-treatment labs today meet parameters for chemotherapy today.  Return as scheduled for follow-up next week with treatment.

## 2016-01-15 ENCOUNTER — Emergency Department (HOSPITAL_COMMUNITY): Payer: Medicare HMO

## 2016-01-15 ENCOUNTER — Emergency Department (HOSPITAL_COMMUNITY)
Admission: EM | Admit: 2016-01-15 | Discharge: 2016-01-16 | Disposition: A | Discharge status: Home / Self Care | Payer: Medicare HMO | Attending: Emergency Medicine | Admitting: Emergency Medicine

## 2016-01-15 ENCOUNTER — Encounter (HOSPITAL_COMMUNITY): Payer: Self-pay | Admitting: Emergency Medicine

## 2016-01-15 DIAGNOSIS — Z859 Personal history of malignant neoplasm, unspecified: Secondary | ICD-10-CM | POA: Insufficient documentation

## 2016-01-15 DIAGNOSIS — N390 Urinary tract infection, site not specified: Secondary | ICD-10-CM | POA: Insufficient documentation

## 2016-01-15 DIAGNOSIS — Z880 Allergy status to penicillin: Secondary | ICD-10-CM | POA: Diagnosis not present

## 2016-01-15 DIAGNOSIS — I509 Heart failure, unspecified: Secondary | ICD-10-CM | POA: Diagnosis not present

## 2016-01-15 DIAGNOSIS — R05 Cough: Secondary | ICD-10-CM | POA: Insufficient documentation

## 2016-01-15 DIAGNOSIS — R0602 Shortness of breath: Secondary | ICD-10-CM | POA: Insufficient documentation

## 2016-01-15 DIAGNOSIS — Z79899 Other long term (current) drug therapy: Secondary | ICD-10-CM | POA: Diagnosis not present

## 2016-01-15 DIAGNOSIS — Z9889 Other specified postprocedural states: Secondary | ICD-10-CM | POA: Insufficient documentation

## 2016-01-15 DIAGNOSIS — K219 Gastro-esophageal reflux disease without esophagitis: Secondary | ICD-10-CM | POA: Diagnosis not present

## 2016-01-15 DIAGNOSIS — M549 Dorsalgia, unspecified: Secondary | ICD-10-CM | POA: Diagnosis present

## 2016-01-15 DIAGNOSIS — M1990 Unspecified osteoarthritis, unspecified site: Secondary | ICD-10-CM | POA: Diagnosis not present

## 2016-01-15 DIAGNOSIS — Z7952 Long term (current) use of systemic steroids: Secondary | ICD-10-CM | POA: Diagnosis not present

## 2016-01-15 DIAGNOSIS — Z792 Long term (current) use of antibiotics: Secondary | ICD-10-CM | POA: Insufficient documentation

## 2016-01-15 DIAGNOSIS — Z8543 Personal history of malignant neoplasm of ovary: Secondary | ICD-10-CM | POA: Insufficient documentation

## 2016-01-15 HISTORY — DX: Malignant neoplasm of unspecified ovary: C56.9

## 2016-01-15 LAB — CBC WITH DIFFERENTIAL/PLATELET
BASOS ABS: 0 10*3/uL (ref 0.0–0.1)
BASOS PCT: 0 %
EOS ABS: 0.1 10*3/uL (ref 0.0–0.7)
EOS PCT: 1 %
HCT: 34.1 % — ABNORMAL LOW (ref 36.0–46.0)
Hemoglobin: 10.5 g/dL — ABNORMAL LOW (ref 12.0–15.0)
LYMPHS PCT: 12 %
Lymphs Abs: 0.9 10*3/uL (ref 0.7–4.0)
MCH: 24.2 pg — ABNORMAL LOW (ref 26.0–34.0)
MCHC: 30.8 g/dL (ref 30.0–36.0)
MCV: 78.6 fL (ref 78.0–100.0)
MONO ABS: 0.2 10*3/uL (ref 0.1–1.0)
Monocytes Relative: 2 %
Neutro Abs: 6.8 10*3/uL (ref 1.7–7.7)
Neutrophils Relative %: 85 %
PLATELETS: 209 10*3/uL (ref 150–400)
RBC: 4.34 MIL/uL (ref 3.87–5.11)
RDW: 17 % — AB (ref 11.5–15.5)
WBC: 8 10*3/uL (ref 4.0–10.5)

## 2016-01-15 LAB — COMPREHENSIVE METABOLIC PANEL
ALBUMIN: 3.4 g/dL — AB (ref 3.5–5.0)
ALT: 10 U/L — AB (ref 14–54)
AST: 15 U/L (ref 15–41)
Alkaline Phosphatase: 71 U/L (ref 38–126)
Anion gap: 8 (ref 5–15)
BUN: 22 mg/dL — AB (ref 6–20)
CHLORIDE: 102 mmol/L (ref 101–111)
CO2: 30 mmol/L (ref 22–32)
CREATININE: 0.98 mg/dL (ref 0.44–1.00)
Calcium: 8.4 mg/dL — ABNORMAL LOW (ref 8.9–10.3)
GFR calc Af Amer: >60 mL/min (ref >60)
GFR, EST NON AFRICAN AMERICAN: 53 mL/min — AB (ref >60)
Glucose, Bld: 140 mg/dL — ABNORMAL HIGH (ref 65–99)
POTASSIUM: 4.7 mmol/L (ref 3.5–5.1)
SODIUM: 140 mmol/L (ref 135–145)
Total Bilirubin: 0.3 mg/dL (ref 0.3–1.2)
Total Protein: 6.7 g/dL (ref 6.5–8.1)

## 2016-01-15 LAB — URINALYSIS, ROUTINE W REFLEX MICROSCOPIC
Bilirubin Urine: NEGATIVE
Glucose, UA: NEGATIVE mg/dL
Ketones, ur: NEGATIVE mg/dL
Nitrite: POSITIVE — AB
PH: 8 (ref 5.0–8.0)
Protein, ur: NEGATIVE mg/dL
SPECIFIC GRAVITY, URINE: 1.015 (ref 1.005–1.030)

## 2016-01-15 LAB — URINE MICROSCOPIC-ADD ON

## 2016-01-15 MED ORDER — OXYCODONE-ACETAMINOPHEN 5-325 MG PO TABS: 1.0000 | ORAL_TABLET | Freq: Four times a day (QID) | ORAL | Status: AC | PRN

## 2016-01-15 MED ORDER — LEVOFLOXACIN 500 MG PO TABS: 500.0000 mg | ORAL_TABLET | Freq: Every day | ORAL | Status: DC

## 2016-01-15 MED ORDER — HYDROCODONE-ACETAMINOPHEN 5-325 MG PO TABS
1.0000 | ORAL_TABLET | Freq: Once | ORAL | Status: AC
  Administered 2016-01-15: 1 via ORAL
  Filled 2016-01-15: qty 1

## 2016-01-15 MED ORDER — ONDANSETRON 4 MG PO TBDP: ORAL_TABLET | ORAL | Status: AC

## 2016-01-15 MED ORDER — LEVOFLOXACIN IN D5W 500 MG/100ML IV SOLN
500.0000 mg | Freq: Once | INTRAVENOUS | Status: AC
  Administered 2016-01-16: 500 mg via INTRAVENOUS
  Filled 2016-01-15: qty 100

## 2016-01-15 NOTE — ED Provider Notes (Signed)
CSN: [id number]     Arrival date & time 01/15/16  1931 History  By signing my name below, I, Jolayne Panther, attest that this documentation has been prepared under the direction and in the presence of Milton Ferguson, MD. Electronically Signed: Jolayne Panther, Scribe. 01/15/2016. 8:11 PM.   Chief Complaint  Patient presents with  . Shortness of Breath    Patient is a 81 y.o. female presenting with shortness of breath. The history is provided by the patient (pt complains of SOB and back pain). No language interpreter was used.  Shortness of Breath Severity:  Mild Onset quality:  Sudden Duration:  2 days Timing:  Constant Progression:  Unchanged Chronicity:  New Relieved by:  Nothing Worsened by:  Nothing tried Ineffective treatments:  None tried Associated symptoms: cough   Associated symptoms: no abdominal pain, no chest pain, no headaches and no rash    HPI Comments: [name] is a 81 y.o. female with a hx of osteoarthritis and CHF, who presents to the Emergency Department complaining of sudden onset, moderate lower back pain which began two days ago . Pt also reports associated SOB and cough. Pt denies taking medication for her back pain.   Past Medical History  Diagnosis Date  . GERD (gastroesophageal reflux disease)   . Osteoarthritis   . Peripheral edema   . CHF (congestive heart failure), NYHA class IV (HCC)     diastolic  . UTI (lower urinary tract infection)   . Cancer ([place])   . Ovarian cancer [facility])    Past Surgical History  Procedure Laterality Date  . Cholecystectomy    . Cardiac catheterization    . Total abdominal hysterectomy  40 years ago    patient does not know if ovaries were removed  . Appendectomy    . Total knee arthroplasty Left   . Chest tube insertion Left 08/04/2014    Procedure: INSERTION PLEURAL DRAINAGE CATHETER;  Surgeon: Ivin Poot, MD;  Location: [facility];  Service: Thoracic;  Laterality: Left;  . Chest tube insertion Right  09/13/2014    Procedure: INSERTION PLEURAL DRAINAGE CATHETER;  Surgeon: Gaye Pollack, MD;  Location: [facility] OR;  Service: Thoracic;  Laterality: Right;  . Removal of pleural drainage catheter Bilateral 12/06/2014    Procedure: REMOVAL OF PLEURAL DRAINAGE CATHETER;  Surgeon: Ivin Poot, MD;  Location: [facility] OR;  Service: Thoracic;  Laterality: Bilateral;   Family History  Problem Relation Age of Onset  . Heart attack Father   . Cancer Paternal Grandmother     "stomach cancer"  . Cancer Cousin     possibly ovarian cancer   Social History  Substance Use Topics  . Smoking status: Never Smoker   . Smokeless tobacco: None  . Alcohol Use: No   OB History    No data available     Review of Systems  Constitutional: Negative for appetite change and fatigue.  HENT: Negative for congestion, ear discharge and sinus pressure.   Eyes: Negative for discharge.  Respiratory: Positive for cough and shortness of breath.   Cardiovascular: Negative for chest pain.  Gastrointestinal: Negative for abdominal pain and diarrhea.  Genitourinary: Negative for frequency and hematuria.  Musculoskeletal: Positive for back pain.  Skin: Negative for rash.  Neurological: Negative for seizures and headaches.  Psychiatric/Behavioral: Negative for hallucinations.    Allergies  Penicillins  Home Medications   Prior to Admission medications   Medication Sig Start Date End Date Taking? Authorizing Provider  Bevacizumab (AVASTIN IV) Inject into the vein. To be given Day 1 every 21 days    Historical Provider, MD  CARBOPLATIN IV Inject into the vein. Days 1, 8, 15 every 21 days    Historical Provider, MD  cephALEXin (KEFLEX) 500 MG capsule Take 1 capsule (500 mg total) by mouth 4 (four) times daily. 09/30/15   Daleen Bo, MD  ciprofloxacin (CIPRO) 250 MG tablet Take 1 tablet (250 mg total) by mouth 2 (two) times daily. 01/03/16   Patrici Ranks, MD  ciprofloxacin (CIPRO) 500 MG tablet 500 mg every 12 hours x  7 days. 10/10/15   Patrici Ranks, MD  docusate sodium (COLACE) 100 MG capsule Take 100 mg by mouth 2 (two) times daily.    Historical Provider, MD  furosemide (LASIX) 20 MG tablet Take 20 mg by mouth daily.    Historical Provider, MD  gabapentin (NEURONTIN) 300 MG capsule Take one capsule at night for 5 days then, increase to two capsules thereafter if tolerated 10/10/15   Patrici Ranks, MD  KLOR-CON M10 10 MEQ tablet Take 20 mEq by mouth 3 (three) times daily.  07/28/15   Historical Provider, MD  lactulose (CHRONULAC) 10 GM/15ML solution Take 30 mLs (20 g total) by mouth 2 (two) times daily as needed for moderate constipation. 09/14/14   Kelvin Cellar, MD  lidocaine-prilocaine (EMLA) cream Apply a quarter size amount to port site 1 hour prior to chemo. Do not rub in. Cover with plastic wrap. 01/08/16   Patrici Ranks, MD  magic mouthwash w/lidocaine SOLN Take 5 mLs by mouth 4 (four) times daily -  before meals and at bedtime. 01/09/16   Patrici Ranks, MD  metoCLOPramide (REGLAN) 5 MG tablet Take 5 mg by mouth 4 (four) times daily.    Historical Provider, MD  mometasone (ELOCON) 0.1 % cream Apply 1 application topically daily. To affected areas (arms and hands)    Historical Provider, MD  Nutritional Supplements (ENSURE CLEAR) LIQD Take 1 Bottle by mouth 3 (three) times daily.    Historical Provider, MD  ondansetron (ZOFRAN) 8 MG tablet Take 1 tablet (8 mg total) by mouth every 8 (eight) hours as needed for nausea or vomiting. 01/08/16   Patrici Ranks, MD  oxyCODONE (ROXICODONE) 5 MG immediate release tablet Take two tablet by mouth every 4 hours as needed for pain Patient taking differently: Take 10 mg by mouth every 4 (four) hours as needed for moderate pain or severe pain.  08/08/15   Patrici Ranks, MD  PACLitaxel Protein-Bound Part (ABRAXANE IV) Inject into the vein. Days 1, 8, 15 every 21 days    Historical Provider, MD  pantoprazole (PROTONIX) 40 MG tablet Take 1 tablet (40 mg  total) by mouth daily. 08/12/14   Milton Ferguson, MD  potassium chloride 20 MEQ TBCR Take 20 mEq by mouth daily. 09/30/15   Daleen Bo, MD  predniSONE (DELTASONE) 10 MG tablet Take 1 tablet (10 mg total) by mouth daily with breakfast. 10/20/14   Baird Cancer, PA-C  prochlorperazine (COMPAZINE) 10 MG tablet Take 1 tablet (10 mg total) by mouth every 6 (six) hours as needed for nausea or vomiting. 01/08/16   Patrici Ranks, MD   BP 140/72 mmHg  Pulse 88  Temp(Src) 98 F (36.7 C)  Resp 20  Ht 5\' 4"  (1.626 m)  Wt 140 lb (63.504 kg)  BMI 24.02 kg/m2  SpO2 97% Physical Exam  Constitutional: She is oriented to person, place,  and time. She appears well-developed.  HENT:  Head: Normocephalic.  Eyes: Conjunctivae and EOM are normal. No scleral icterus.  Neck: Neck supple. No tracheal deviation present. No thyromegaly present.  Cardiovascular: Normal rate and regular rhythm.  Exam reveals no gallop and no friction rub.   No murmur heard. Pulmonary/Chest: No stridor. She has no wheezes. She has no rales. She exhibits no tenderness.  Abdominal: She exhibits no distension. There is no tenderness. There is no rebound.  Musculoskeletal: Normal range of motion. She exhibits no edema.  Moderate lumbar spine tenderness  Lymphadenopathy:    She has no cervical adenopathy.  Neurological: She is oriented to person, place, and time. She exhibits normal muscle tone. Coordination normal.  Skin: Skin is warm. No rash noted. No erythema.  Psychiatric: She has a normal mood and affect. Her behavior is normal.    ED Course  Procedures  DIAGNOSTIC STUDIES:    Oxygen Saturation is 97% on RA, adeuate by my interpretation.   COORDINATION OF CARE:  8:11 PM Will administer pt medication for back pain. Will review labs and chest and spine xray. Discussed treatment plan with pt at bedside and pt agreed to plan.   Labs Review Labs Reviewed  CBC WITH DIFFERENTIAL/PLATELET - Abnormal; Notable for the  following:    Hemoglobin 10.5 (*)    HCT 34.1 (*)    MCH 24.2 (*)    RDW 17.0 (*)    All other components within normal limits  COMPREHENSIVE METABOLIC PANEL - Abnormal; Notable for the following:    Glucose, Bld 140 (*)    BUN 22 (*)    Calcium 8.4 (*)    Albumin 3.4 (*)    ALT 10 (*)    GFR calc non Af Amer 53 (*)    All other components within normal limits  URINALYSIS, ROUTINE W REFLEX MICROSCOPIC (NOT AT [facility])    Imaging Review No results found. I have personally reviewed and evaluated these images and lab results as part of my medical decision-making.   EKG Interpretation None      MDM   Final diagnoses:  None    Patient with UTI and questionable small infiltrate in her lungs. Patient nontoxic white count normal vital signs normal patient not dyspneic or hypoxic. Her urine will be cultured and she will be placed on Levaquin. And will follow-up with her PCP. The chart was scribed for me under my direct supervision.  I personally performed the history, physical, and medical decision making and all procedures in the evaluation of this patient.Milton Ferguson, MD 01/15/16 [phone]

## 2016-01-15 NOTE — ED Notes (Signed)
Pt c/o sob since Saturday. Pt also c/o productive cough and weakness.

## 2016-01-15 NOTE — ED Notes (Signed)
Patient ambulatory to restroom  ?

## 2016-01-15 NOTE — Discharge Instructions (Signed)
Follow up with your md in one week for recheck.  Get seen sooner if not improving.

## 2016-01-16 ENCOUNTER — Encounter: Payer: Self-pay | Admitting: *Deleted

## 2016-01-16 NOTE — Progress Notes (Signed)
[facility] Psychosocial Distress Screening Clinical Social Work  Clinical Social Work was referred by distress screening protocol.  The patient scored a 5 on the Psychosocial Distress Thermometer which indicates moderate distress. Clinical Social Worker phoned pt at home to assess for distress and other psychosocial needs. CSW left vm introducing self and explaining role of CSW. CSW awaits return call and will attempt to see at follow up appointments.   ONCBCN DISTRESS SCREENING 01/09/2016  Screening Type Initial Screening  Distress experienced in past week (1-10) 5  Practical problem type Insurance  Emotional problem type Depression  Physical Problem type Pain;Sleep/insomnia;Breathing;Constipation/diarrhea;Swollen arms/legs  Physician notified of physical symptoms   Referral to clinical psychology   Referral to clinical social work Yes  Referral to dietition   Referral to financial advocate   Referral to support programs   Referral to palliative care     Clinical Social Worker follow up needed: Yes.    If yes, follow up plan: See above Loren Racer, [place] Worker [place]  [facility] Phone: [phone] Fax: [phone]

## 2016-01-17 ENCOUNTER — Other Ambulatory Visit (HOSPITAL_COMMUNITY): Payer: Medicare HMO

## 2016-01-17 ENCOUNTER — Encounter (HOSPITAL_COMMUNITY): Payer: Self-pay | Admitting: Hematology & Oncology

## 2016-01-17 ENCOUNTER — Encounter (HOSPITAL_COMMUNITY): Payer: Medicare HMO

## 2016-01-17 ENCOUNTER — Encounter (HOSPITAL_BASED_OUTPATIENT_CLINIC_OR_DEPARTMENT_OTHER): Payer: Medicare HMO | Admitting: Hematology & Oncology

## 2016-01-17 VITALS — BP 127/64 | HR 79 | Temp 97.8°F | Resp 18 | Wt 176.0 lb

## 2016-01-17 DIAGNOSIS — C482 Malignant neoplasm of peritoneum, unspecified: Secondary | ICD-10-CM

## 2016-01-17 DIAGNOSIS — N390 Urinary tract infection, site not specified: Secondary | ICD-10-CM | POA: Diagnosis not present

## 2016-01-17 DIAGNOSIS — F32A Depression, unspecified: Secondary | ICD-10-CM

## 2016-01-17 DIAGNOSIS — J910 Malignant pleural effusion: Secondary | ICD-10-CM

## 2016-01-17 DIAGNOSIS — F329 Major depressive disorder, single episode, unspecified: Secondary | ICD-10-CM

## 2016-01-17 DIAGNOSIS — Z95828 Presence of other vascular implants and grafts: Secondary | ICD-10-CM

## 2016-01-17 LAB — CBC WITH DIFFERENTIAL/PLATELET
BASOS ABS: 0 10*3/uL (ref 0.0–0.1)
BASOS PCT: 0 %
EOS PCT: 3 %
Eosinophils Absolute: 0.2 10*3/uL (ref 0.0–0.7)
HCT: 36 % (ref 36.0–46.0)
Hemoglobin: 11.2 g/dL — ABNORMAL LOW (ref 12.0–15.0)
Lymphocytes Relative: 22 %
Lymphs Abs: 1.5 10*3/uL (ref 0.7–4.0)
MCH: 24.3 pg — ABNORMAL LOW (ref 26.0–34.0)
MCHC: 31.1 g/dL (ref 30.0–36.0)
MCV: 78.1 fL (ref 78.0–100.0)
MONO ABS: 0.1 10*3/uL (ref 0.1–1.0)
Monocytes Relative: 2 %
Neutro Abs: 5 10*3/uL (ref 1.7–7.7)
Neutrophils Relative %: 73 %
PLATELETS: 251 10*3/uL (ref 150–400)
RBC: 4.61 MIL/uL (ref 3.87–5.11)
RDW: 17.1 % — AB (ref 11.5–15.5)
WBC: 6.8 10*3/uL (ref 4.0–10.5)

## 2016-01-17 LAB — COMPREHENSIVE METABOLIC PANEL
ALBUMIN: 3.4 g/dL — AB (ref 3.5–5.0)
ALT: 10 U/L — AB (ref 14–54)
AST: 15 U/L (ref 15–41)
Alkaline Phosphatase: 62 U/L (ref 38–126)
Anion gap: 7 (ref 5–15)
BUN: 23 mg/dL — AB (ref 6–20)
CHLORIDE: 103 mmol/L (ref 101–111)
CO2: 30 mmol/L (ref 22–32)
Calcium: 8.8 mg/dL — ABNORMAL LOW (ref 8.9–10.3)
Creatinine, Ser: 1.12 mg/dL — ABNORMAL HIGH (ref 0.44–1.00)
GFR calc Af Amer: 53 mL/min — ABNORMAL LOW (ref >60)
GFR calc non Af Amer: 45 mL/min — ABNORMAL LOW (ref >60)
GLUCOSE: 117 mg/dL — AB (ref 65–99)
POTASSIUM: 4.3 mmol/L (ref 3.5–5.1)
SODIUM: 140 mmol/L (ref 135–145)
Total Bilirubin: 0.4 mg/dL (ref 0.3–1.2)
Total Protein: 6.9 g/dL (ref 6.5–8.1)

## 2016-01-17 MED ORDER — PAROXETINE HCL 10 MG PO TABS: 10.0000 mg | ORAL_TABLET | Freq: Every day | ORAL | Status: DC

## 2016-01-17 NOTE — Patient Instructions (Addendum)
[place] at [facility] Discharge Instructions  RECOMMENDATIONS MADE BY THE CONSULTANT AND ANY TEST RESULTS WILL BE SENT TO YOUR REFERRING PHYSICIAN.   Exam and discussion by Dr Whitney Muse today MRI of your back-Friday 2/17, you just need to be able to lay down on the table to get the MRI Paxil 10mg  sent to [facility] pharmacy, start taking this daily, it will help your mood give it a few weeks Call your drug store to see if they have a refill on your estrogen cream  Return to see the doctor next week with chemotherapy  Please call the clinic if you have any questions or concerns    Thank you for choosing [place] at [facility] to provide your oncology and hematology care.  To afford each patient quality time with our provider, please arrive at least 15 minutes before your scheduled appointment time.   Beginning January 23rd 2017 lab work for the [facility] will be done in the  Main lab at [facility] on 1st floor. If you have a lab appointment with the [place] please come in thru the  Main Entrance and check in at the main information desk  You need to re-schedule your appointment should you arrive 10 or more minutes late.  We strive to give you quality time with our providers, and arriving late affects you and other patients whose appointments are after yours.  Also, if you no show three or more times for appointments you may be dismissed from the clinic at the providers discretion.     Again, thank you for choosing [facility].  Our hope is that these requests will decrease the amount of time that you wait before being seen by our physicians.       _____________________________________________________________  Should you have questions after your visit to [facility], please contact our office at [phone] between the hours of 8:30 a.m. and 4:30 p.m.  Voicemails left after 4:30 p.m. will not be returned  until the following business day.  For prescription refill requests, have your pharmacy contact our office.

## 2016-01-17 NOTE — Progress Notes (Signed)
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 female returns for follow-up of her primary peritoneal carcinoma. She has restarted therapy.  [name] is accompanied by her boyfriend today. Patient presents in wheelchair.  She is currently taking an antibiotic for a UTI.   She continues to experience back pain. She has a MRI Lumbar Spine scheduled for Friday, 2/17. States that she just wants to be able to stand up without discomfort.   When asked if she thinks chemotherapy is too hard, states "No I do not think it is too hard". Her boyfriend reports that she has been eating and talking less. She is not currently on an anti-depressant. Her boyfriend states he feels she needs one as she is very worried an down about her cancer recurrence.   She has not been applying her vaginal estrogen cream because she misplaced it. She has not called the pharmacy to see if she has refills on it.  Reports intermittent numbness in her left hand. This is chronic. She has had no nausea, vomiting or change in her bowel habits.    MEDICAL HISTORY: Past Medical History  Diagnosis Date  . GERD (gastroesophageal reflux disease)   . Osteoarthritis   . Peripheral edema   . CHF (congestive heart failure), NYHA class IV (HCC)     diastolic  . UTI (lower urinary tract infection)   . Cancer ([place])   . Ovarian cancer ([place])     has GERD; CONSTIPATION, CHRONIC; HEMATOCHEZIA; OSTEOARTHRITIS, LOWER LEG; KNEE PAIN; BURSITIS, KNEE; Dysphagia; Malignant pleural effusion; Edema of both legs; Heart failure, diastolic, with acute decompensation ([place]); Hyperkalemia; Anemia, unspecified; Rectal bleed; Chest pain; Protein-calorie malnutrition, severe ([place]); Dysphagia, pharyngoesophageal  phase; Constipation; Acute on chronic congestive heart failure ([place]); Acute on chronic respiratory failure with hypoxia ([place]); Primary peritoneal carcinomatosis ([place]); Anemia due to chemotherapy; Cellulitis; and Genetic testing on her problem list.      Primary peritoneal carcinomatosis ([place])   07/25/2014 Pathology Results Left pleural effusion positive for malignant cell consistent with adenocarcinoma.   07/27/2014 - 08/05/2014 Hospital Admission Heart failure with malignant pleural effusion   07/28/2014 Pathology Results Left pleural effusion positive for malignant cells consistent with adenocarcinoma (CK7 pos, CK20 neg, WT-1 pos, ER weakly pos, PR neg, TTF-1 neg, GCDFP-15 neg).  Findings favor gyn/ovarian primary.   08/01/2014 Pathology Results [facility]- Post[facility] genomic findings: BRCA2 R234f*31, STK11 loss, MYC amplification-equivocal, TP53 R273C, MLL2 QO1157.  Targeted treatments include Olaparib (BRCA2) and Everolimus/Temsirolimus (STK11).   08/03/2014 Tumor Marker CA 125 4954   08/04/2014 Procedure Left pleurX catheter by Dr. VLucianne LeiTright   09/08/2014 - 09/14/2014 Hospital Admission Hospitalized with the following main issues: acute on chronic CHF, acute on chronic respiratory failure with hypoxia, and malignant plueral effusion on right    09/13/2014 Procedure Right PleurX catheter by Dr. BCyndia Bent  09/13/2014 Pathology Results Right pleural effusion positive for malignant adenocarcinoma   09/21/2014 Tumor Marker CA 125 3869   09/22/2014 - 11/17/2014 Chemotherapy Carboplatin/Paclitaxel days 1, 8, 15 every 28 days.   10/20/2014 Tumor Marker CA 125 335   11/23/2014 - 02/23/2015 Chemotherapy Carboplatin/Abraxane day 1, 8, 15 every 28 days.   06/06/2015 Tumor Marker CA 125: 21.7    08/04/2015 Tumor Marker CA 125: 23.6    08/17/2015 Imaging CT abd/pelvis-  Resolution of ascites and pleural effusions since previous study. Near complete resolution of soft tissue stranding and nodularity in the omental and  mesenteric fat.  Resolution of left inguinal lymphadenopathy since prior exam.   10/10/2015 Tumor Marker CA 125: 47.4 (H)    11/30/2015 Tumor Marker CA 125: 101.2 (H)   12/25/2015 Tumor Marker CA 125: 118.4 (H)    12/29/2015 Progression    12/29/2015 Imaging CT CAP- progression of peritoneal carcinomatosis, with interval increase in number and size of numerous large lesions predominantly in the low anatomic pelvis   01/11/2016 -  Chemotherapy Carboplatin/Abraxane days 1, 8, 15 every 21 days.     is allergic to penicillins.  [name] does not currently have medications on file.  SURGICAL HISTORY: Past Surgical History  Procedure Laterality Date  . Cholecystectomy    . Cardiac catheterization    . Total abdominal hysterectomy  40 years ago    patient does not know if ovaries were removed  . Appendectomy    . Total knee arthroplasty Left   . Chest tube insertion Left 08/04/2014    Procedure: INSERTION PLEURAL DRAINAGE CATHETER;  Surgeon: Ivin Poot, MD;  Location: [facility];  Service: Thoracic;  Laterality: Left;  . Chest tube insertion Right 09/13/2014    Procedure: INSERTION PLEURAL DRAINAGE CATHETER;  Surgeon: Gaye Pollack, MD;  Location: [facility];  Service: Thoracic;  Laterality: Right;  . Removal of pleural drainage catheter Bilateral 12/06/2014    Procedure: REMOVAL OF PLEURAL DRAINAGE CATHETER;  Surgeon: Ivin Poot, MD;  Location: [facility];  Service: Thoracic;  Laterality: Bilateral;    SOCIAL HISTORY: Social History   Social History  . Marital Status: Widowed    Spouse Name: N/A  . Number of Children: 4  . Years of Education: N/A   Occupational History  . nurses aid    Social History Main Topics  . Smoking status: Never Smoker   . Smokeless tobacco: Not on file  . Alcohol Use: No  . Drug Use: No  . Sexual Activity: Not on file   Other Topics Concern  . Not on file   Social History Narrative   FAMILY HISTORY: Noncontributory  Review of Systems  Constitutional:  Negative for fever, chills, fatigue, and weight loss.  HENT: Negative for congestion, hearing loss, nosebleeds, sore throat and tinnitus.   Eyes: Negative for blurred vision, double vision, pain and discharge.  Respiratory: Negative for cough, hemoptysis, sputum production, shortness of breath and wheezing.  Positive for difficulty breathing. (chronic) Cardiovascular: Negative for chest pain, palpitations, claudication, leg swelling and PND.  Gastrointestinal: Negative for heartburn, nausea, vomiting, abdominal pain, diarrhea, constipation, blood in stool and melena.  Genitourinary: Positive for dysuria. Negative for hematuria, urgency, frequency. Currently taking antibiotics for a UTI Musculoskeletal: Positive for back pain. Negative for myalgias, joint pain and falls.  Skin: Negative for itching and rash.  Neurological: Negative for dizziness, tingling, tremors, speech change, focal weakness, sensory changes, seizures, loss of consciousness, weakness and headaches.  Endo/Heme/Allergies: Does not bruise/bleed easily.  Psychiatric/Behavioral: Positive for confusion. Negative for depression, suicidal ideas, memory loss and substance abuse. The patient is not nervous/anxious and does not have insomnia.   14 point review of systems was performed and is negative except as detailed under history of present illness and above   PHYSICAL EXAMINATION ECOG PERFORMANCE STATUS: 1 - Symptomatic but completely ambulatory  Filed Vitals:   01/17/16 0858  BP: 127/64  Pulse: 79  Temp: 97.8 F (36.6  C)  Resp: 18   Physical Exam  Constitutional: She is oriented to person, place, and time and well-developed, well-nourished, and in no distress.  In wheelchair. PE performed in wheelchair. HENT:  Head: Normocephalic and atraumatic.  Mouth/Throat: No oropharyngeal exudate.  Eyes: Conjunctivae and EOM are normal. Pupils are equal, round, and reactive to light. No scleral icterus.  Neck: Normal range of  motion. Neck supple. No JVD present. No thyromegaly present.  Cardiovascular: Normal rate and regular rhythm.  Exam reveals no friction rub.   No murmur heard. Pulmonary/Chest: Effort normal and breath sounds normal. No respiratory distress. She has no wheezes. She has no rales.  Abdominal: Soft. Bowel sounds are normal. She exhibits no distension. Non-tender. There is no rebound and no guarding.  no palpable mass Musculoskeletal: Normal range of motion.  Lymphadenopathy:    She has no cervical adenopathy.  Neurological: She is alert and oriented to person, place, and time. No cranial nerve deficit. Coordination normal.  Skin: Skin is warm and dry.  Psychiatric: Mood, memory, affect and judgment normal.    LABORATORY DATA: I have reviewed the data as listed. CBC    Component Value Date/Time   WBC 6.8 01/17/2016 0827   RBC 4.61 01/17/2016 0827   RBC 4.29 07/27/2014 2257   HGB 11.2* 01/17/2016 0827   HCT 36.0 01/17/2016 0827   PLT 251 01/17/2016 0827   MCV 78.1 01/17/2016 0827   MCH 24.3* 01/17/2016 0827   MCHC 31.1 01/17/2016 0827   RDW 17.1* 01/17/2016 0827   LYMPHSABS 1.5 01/17/2016 0827   MONOABS 0.1 01/17/2016 0827   EOSABS 0.2 01/17/2016 0827   BASOSABS 0.0 01/17/2016 0827   CMP     Component Value Date/Time   NA 140 01/17/2016 0827   K 4.3 01/17/2016 0827   CL 103 01/17/2016 0827   CO2 30 01/17/2016 0827   GLUCOSE 117* 01/17/2016 0827   BUN 23* 01/17/2016 0827   CREATININE 1.12* 01/17/2016 0827   CALCIUM 8.8* 01/17/2016 0827   PROT 6.9 01/17/2016 0827   ALBUMIN 3.4* 01/17/2016 0827   AST 15 01/17/2016 0827   ALT 10* 01/17/2016 0827   ALKPHOS 62 01/17/2016 0827   BILITOT 0.4 01/17/2016 0827   GFRNONAA 45* 01/17/2016 0827   GFRAA 35* 01/17/2016 0827     RADIOLOGY: I have personally reviewed the radiological images as listed and agreed with the findings in the report.  Study Result     CLINICAL DATA: 81 year old female with history of  primary peritoneal carcinomatosis (adenocarcinoma) status post chemotherapy (last treatment 2 weeks ago). Shortness of breath and cough for the past year. Nausea for the past year.  EXAM: CT CHEST, ABDOMEN, AND PELVIS WITH CONTRAST  TECHNIQUE: Multidetector CT imaging of the chest, abdomen and pelvis was performed following the standard protocol during bolus administration of intravenous contrast.  CONTRAST: 128m OMNIPAQUE IOHEXOL 300 MG/ML SOLN  COMPARISON: CT the abdomen and pelvis 08/17/2015. CT of the chest, abdomen and pelvis 07/29/2014. Multiple other prior examinations.  FINDINGS: CT CHEST FINDINGS  Mediastinum/Lymph Nodes: Heart size is normal. There is no significant pericardial fluid, thickening or pericardial calcification. There is atherosclerosis of the thoracic aorta, the great vessels of the mediastinum and the coronary arteries, including calcified atherosclerotic plaque in the left main, left anterior descending, left circumflex and right coronary arteries. No pathologically enlarged mediastinal or hilar lymph nodes. Large hiatal hernia. Right internal jugular single-lumen porta cath with tip terminating in the distal superior vena cava. No axillary lymphadenopathy.  Lungs/Pleura: Tiny 2 mm subpleural nodule in the periphery of the right middle lobe (image 34 of series 7), unchanged compared to prior study 07/29/2014, considered benign, presumably a subpleural lymph node. No other larger more suspicious appearing pulmonary nodules or masses are identified at this time. Areas of linear scarring are noted throughout the lung bases bilaterally. No acute consolidative airspace disease. No pleural effusions.  Musculoskeletal/Soft Tissues: There are no aggressive appearing lytic or blastic lesions noted in the visualized portions of the skeleton.  CT ABDOMEN AND PELVIS FINDINGS  Hepatobiliary: No suspicious appearing cystic or solid  hepatic lesions. Status post cholecystectomy. Mild intrahepatic biliary ductal dilatation is unchanged, as is mild extrahepatic biliary ductal dilatation (common bile duct measures up to 12 mm in the porta hepatis), presumably reflective of benign post cholecystectomy physiology.  Pancreas: No pancreatic mass. No pancreatic ductal dilatation. No pancreatic or peripancreatic fluid or inflammatory changes.  Spleen: Unremarkable.  Adrenals/Urinary Tract: Bilateral adrenal glands are normal in appearance. Multifocal cortical thinning throughout the kidneys bilaterally, in addition to mild bilateral renal atrophy. No suspicious renal lesions. No hydroureteronephrosis. Urinary bladder is partially decompressed, but unremarkable in appearance.  Stomach/Bowel: Intraabdominal portion of the stomach is normal. No pathologic dilatation of small bowel or colon. Extensive colonic diverticulosis is noted, most severe throughout the descending and sigmoid colon, without surrounding inflammatory changes to suggest an acute diverticulitis at this time.  Vascular/Lymphatic: Atherosclerotic calcifications throughout the abdominal and pelvic vasculature, without evidence of aneurysm or dissection. No lymphadenopathy noted in the abdomen or pelvis.  Reproductive: Status post total abdominal hysterectomy. Ovaries are not confidently identified and may be surgically absent or atrophic.  Other: Compared to the prior examination from 08/17/2015 the abnormal fluid collections and soft tissue masses in the low anatomic pelvis have significantly increased in size. The largest of these lesions now include a 6.0 x 4.8 cm heterogeneously enhancing multi-cystic lesion along the right pelvic sidewall (image 102 of series 2), and a similar appearing lesion measuring 4.2 x 7.1 cm immediately inferior to the proximal sigmoid colon (image 106 of series 2), which is inseparable from the colon on coronal  and sagittal reformatted images. Several other smaller collections are also noted in the pelvis, all of which are larger than the prior examination. Some slight haziness is also noted in the omentum, best appreciated to the left of midline on images 71 and 73 of series 2, which could represent early omental involvement. No significant volume of free flowing ascites. No pneumoperitoneum.  Musculoskeletal: There are no aggressive appearing lytic or blastic lesions noted in the visualized portions of the skeleton.  IMPRESSION: 1. Today's study demonstrates progression of peritoneal carcinomatosis, with interval increase in number and size of numerous large lesions predominantly in the low anatomic pelvis, as detailed above. 2. No evidence of thoracic metastatic disease. 3. Atherosclerosis, including left main and 3 vessel coronary artery disease. Assessment for potential risk factor modification, dietary therapy or pharmacologic therapy may be warranted, if clinically indicated. 4. Large hiatal hernia. 5. Additional incidental findings, similar prior studies, as above.   Electronically Signed  By: Vinnie Langton M.D.  On: 12/29/2015 16:04    ASSESSMENT and THERAPY PLAN:  Stage IV primary peritoneal carcinoma BRCA 2 positivity Chronic L side, Left lower back pain Recurrent peritoneal carcinoma  The patient is currently taking antibiotics for a recently diagnosed UTI. I have written her a prescription for 10 mg paxil.   She has a MRI Lumbar Spine scheduled by Dr. Luan Pulling for  Friday, 2/17 to evaluate her continuing back pain. I will review these results, as well.   She will return for treatment tomorrow and return for follow up next week. We discussed taking treatment a week at a time. If at any time she wishes to discontinue therapy I reminded her that it is her choice to make and she notes she understands that. For now she wishes to proceed.  All questions were  answered. The patient knows to call the clinic with any problems, questions or concerns. We can certainly see the patient much sooner if necessary.   This note was signed electronically.  This document serves as a record of services personally performed by Ancil Linsey, MD. It was created on her behalf by Arlyce Harman, a trained medical scribe. The creation of this record is based on the scribe's personal observations and the provider's statements to them. This document has been checked and approved by the attending provider.  I have reviewed the above documentation for accuracy and completeness and I agree with the above.  Kelby Fam. Whitney Muse, MD

## 2016-01-18 ENCOUNTER — Encounter (HOSPITAL_BASED_OUTPATIENT_CLINIC_OR_DEPARTMENT_OTHER): Payer: Medicare HMO

## 2016-01-18 VITALS — BP 110/64 | HR 69 | Temp 97.9°F | Resp 18 | Wt 176.2 lb

## 2016-01-18 DIAGNOSIS — C482 Malignant neoplasm of peritoneum, unspecified: Secondary | ICD-10-CM

## 2016-01-18 DIAGNOSIS — Z5111 Encounter for antineoplastic chemotherapy: Secondary | ICD-10-CM

## 2016-01-18 DIAGNOSIS — G629 Polyneuropathy, unspecified: Secondary | ICD-10-CM

## 2016-01-18 LAB — URINE CULTURE: Special Requests: NORMAL

## 2016-01-18 MED ORDER — HEPARIN SOD (PORK) LOCK FLUSH 100 UNIT/ML IV SOLN
INTRAVENOUS | Status: AC
  Filled 2016-01-18: qty 5

## 2016-01-18 MED ORDER — CARBOPLATIN CHEMO INJECTION 450 MG/45ML
156.8000 mg | Freq: Once | INTRAVENOUS | Status: AC
  Administered 2016-01-18: 160 mg via INTRAVENOUS
  Filled 2016-01-18: qty 16

## 2016-01-18 MED ORDER — HEPARIN SOD (PORK) LOCK FLUSH 100 UNIT/ML IV SOLN
500.0000 [IU] | Freq: Once | INTRAVENOUS | Status: AC | PRN
  Administered 2016-01-18: 500 [IU]

## 2016-01-18 MED ORDER — GABAPENTIN 300 MG PO CAPS: 600.0000 mg | ORAL_CAPSULE | Freq: Every day | ORAL | Status: AC

## 2016-01-18 MED ORDER — SODIUM CHLORIDE 0.9 % IV SOLN
Freq: Once | INTRAVENOUS | Status: AC
  Administered 2016-01-18: 10:00:00 via INTRAVENOUS
  Filled 2016-01-18: qty 5

## 2016-01-18 MED ORDER — SODIUM CHLORIDE 0.9 % IV SOLN
Freq: Once | INTRAVENOUS | Status: AC
  Administered 2016-01-18: 10:00:00 via INTRAVENOUS

## 2016-01-18 MED ORDER — PALONOSETRON HCL INJECTION 0.25 MG/5ML
0.2500 mg | Freq: Once | INTRAVENOUS | Status: AC
  Administered 2016-01-18: 0.25 mg via INTRAVENOUS
  Filled 2016-01-18: qty 5

## 2016-01-18 MED ORDER — PACLITAXEL PROTEIN-BOUND CHEMO INJECTION 100 MG
80.0000 mg/m2 | Freq: Once | INTRAVENOUS | Status: AC
  Administered 2016-01-18: 150 mg via INTRAVENOUS
  Filled 2016-01-18: qty 30

## 2016-01-18 MED ORDER — SODIUM CHLORIDE 0.9% FLUSH
10.0000 mL | INTRAVENOUS | Status: DC | PRN
  Administered 2016-01-18: 10 mL
  Filled 2016-01-18: qty 10

## 2016-01-18 NOTE — Patient Instructions (Signed)
..  [facility] Discharge Instructions for Patients Receiving Chemotherapy   Beginning January 23rd 2017 lab work for the [facility] will be done in the  Main lab at [facility] on 1st floor. If you have a lab appointment with the [facility] please come in thru the  Main Entrance and check in at the main information desk   Today you received the following chemotherapy agents Carbo and abraxane  To help prevent nausea and vomiting after your treatment, we encourage you to take your nausea medication  If you develop nausea and vomiting, or diarrhea that is not controlled by your medication, call the clinic.  The clinic phone number is [phone]. Office hours are Monday-Friday 8:30am-5:00pm.  BELOW ARE SYMPTOMS THAT SHOULD BE REPORTED IMMEDIATELY:  *FEVER GREATER THAN 101.0 F  *CHILLS WITH OR WITHOUT FEVER  NAUSEA AND VOMITING THAT IS NOT CONTROLLED WITH YOUR NAUSEA MEDICATION  *UNUSUAL SHORTNESS OF BREATH  *UNUSUAL BRUISING OR BLEEDING  TENDERNESS IN MOUTH AND THROAT WITH OR WITHOUT PRESENCE OF ULCERS  *URINARY PROBLEMS  *BOWEL PROBLEMS  UNUSUAL RASH Items with * indicate a potential emergency and should be followed up as soon as possible. If you have an emergency after office hours please contact your primary care physician or go to the nearest emergency department.  Please call the clinic during office hours if you have any questions or concerns.   You may also contact the Patient Navigator at [phone] should you have any questions or need assistance in obtaining follow up care.

## 2016-01-18 NOTE — Progress Notes (Signed)
Tolerated well

## 2016-01-19 ENCOUNTER — Ambulatory Visit (HOSPITAL_COMMUNITY): Payer: Medicare HMO

## 2016-01-19 ENCOUNTER — Telehealth (HOSPITAL_COMMUNITY): Payer: Self-pay

## 2016-01-19 NOTE — Progress Notes (Signed)
ED Antimicrobial Stewardship Positive Culture Follow Up   [name] is an 81 y.o. female who presented to [facility] on 01/15/2016 with a chief complaint of  Chief Complaint  Patient presents with  . Shortness of Breath    Recent Results (from the past 720 hour(s))  Urine culture     Status: None   Collection Time: 01/16/16 12:40 AM  Result Value Ref Range Status   Specimen Description URINE, CLEAN CATCH  Final   Special Requests Normal  Final   Culture   Final    >=100,000 COLONIES/mL ESCHERICHIA COLI Performed at [facility]    Report Status 01/18/2016 FINAL  Final   Organism ID, Bacteria ESCHERICHIA COLI  Final      Susceptibility   Escherichia coli - MIC*    AMPICILLIN >=32 RESISTANT Resistant     CEFAZOLIN <=4 SENSITIVE Sensitive     CEFTRIAXONE <=1 SENSITIVE Sensitive     CIPROFLOXACIN >=4 RESISTANT Resistant     GENTAMICIN >=16 RESISTANT Resistant     IMIPENEM <=0.25 SENSITIVE Sensitive     NITROFURANTOIN <=16 SENSITIVE Sensitive     TRIMETH/SULFA >=320 RESISTANT Resistant     AMPICILLIN/SULBACTAM 16 INTERMEDIATE Intermediate     PIP/TAZO <=4 SENSITIVE Sensitive     * >=100,000 COLONIES/mL ESCHERICHIA COLI    [x]  Treated with levofloxacin, organism resistant to prescribed antimicrobial 81 yo who presented with likely UTI. It has grew out e.coli that is resistant to levaquin. We'll change to Ceftin x 7 days  New antibiotic prescription: Ceftin 250mg  PO BID x 7 days  ED Provider: Josephina Gip, PA   Onnie Boer, PharmD Pager: [phone] Infectious Diseases Pharmacist Phone# [phone]

## 2016-01-19 NOTE — Telephone Encounter (Signed)
Post ED Visit - Positive Culture Follow-up: Successful Patient Follow-Up  Culture assessed and recommendations reviewed by: []  Elenor Quinones, Pharm.D. []  Heide Guile, Pharm.D., BCPS []  Parks Neptune, Pharm.D. []  Alycia Rossetti, Pharm.D., BCPS [x]  [place], Pharm.D., BCPS, AAHIVP []  Legrand Como, Pharm.D., BCPS, AAHIVP []  Milus Glazier, Pharm.D. []  Stephens November, Pharm.D.  Positive urine culture, <.= 100,000 colonies E Coli   []  Patient discharged without antimicrobial prescription and treatment is now indicated [x]  Organism is resistant to prescribed ED discharge antimicrobial (Levofloxacin) []  Patient with positive blood cultures  Changes discussed with ED provider: S. Barrett PA  New antibiotic prescription "Ceftin 250 mg po BID x 7 days" Called to [facility] [phone] and left on VM.  Contacted patient, date 2/17/147, time 12:49 Pt informed of dx, need to stop Levaquin & need for new Rx.   Dortha Kern 01/19/2016, 12:51 PM

## 2016-01-24 ENCOUNTER — Encounter (HOSPITAL_COMMUNITY): Payer: Medicare HMO

## 2016-01-24 ENCOUNTER — Other Ambulatory Visit (HOSPITAL_COMMUNITY): Payer: Medicare HMO

## 2016-01-24 ENCOUNTER — Ambulatory Visit (HOSPITAL_COMMUNITY): Payer: Medicare HMO | Admitting: Oncology

## 2016-01-24 ENCOUNTER — Encounter (HOSPITAL_COMMUNITY): Payer: Self-pay | Admitting: Oncology

## 2016-01-24 DIAGNOSIS — Z66 Do not resuscitate: Secondary | ICD-10-CM

## 2016-01-24 DIAGNOSIS — C482 Malignant neoplasm of peritoneum, unspecified: Secondary | ICD-10-CM | POA: Diagnosis not present

## 2016-01-24 HISTORY — DX: Do not resuscitate: Z66

## 2016-01-24 LAB — CBC WITH DIFFERENTIAL/PLATELET
Basophils Absolute: 0 10*3/uL (ref 0.0–0.1)
Basophils Relative: 1 %
EOS PCT: 3 %
Eosinophils Absolute: 0.1 10*3/uL (ref 0.0–0.7)
HEMATOCRIT: 38.3 % (ref 36.0–46.0)
HEMOGLOBIN: 11.8 g/dL — AB (ref 12.0–15.0)
LYMPHS ABS: 1.5 10*3/uL (ref 0.7–4.0)
LYMPHS PCT: 31 %
MCH: 24.1 pg — AB (ref 26.0–34.0)
MCHC: 30.8 g/dL (ref 30.0–36.0)
MCV: 78.3 fL (ref 78.0–100.0)
Monocytes Absolute: 0.2 10*3/uL (ref 0.1–1.0)
Monocytes Relative: 5 %
NEUTROS PCT: 60 %
Neutro Abs: 2.9 10*3/uL (ref 1.7–7.7)
Platelets: 233 10*3/uL (ref 150–400)
RBC: 4.89 MIL/uL (ref 3.87–5.11)
RDW: 17.9 % — ABNORMAL HIGH (ref 11.5–15.5)
WBC: 4.8 10*3/uL (ref 4.0–10.5)

## 2016-01-24 LAB — COMPREHENSIVE METABOLIC PANEL
ALK PHOS: 57 U/L (ref 38–126)
ALT: 9 U/L — AB (ref 14–54)
AST: 13 U/L — ABNORMAL LOW (ref 15–41)
Albumin: 3.5 g/dL (ref 3.5–5.0)
Anion gap: 7 (ref 5–15)
BILIRUBIN TOTAL: 0.3 mg/dL (ref 0.3–1.2)
BUN: 25 mg/dL — ABNORMAL HIGH (ref 6–20)
CALCIUM: 8.5 mg/dL — AB (ref 8.9–10.3)
CO2: 28 mmol/L (ref 22–32)
CREATININE: 1.1 mg/dL — AB (ref 0.44–1.00)
Chloride: 106 mmol/L (ref 101–111)
GFR, EST AFRICAN AMERICAN: 54 mL/min — AB (ref >60)
GFR, EST NON AFRICAN AMERICAN: 46 mL/min — AB (ref >60)
Glucose, Bld: 111 mg/dL — ABNORMAL HIGH (ref 65–99)
Potassium: 3.9 mmol/L (ref 3.5–5.1)
Sodium: 141 mmol/L (ref 135–145)
Total Protein: 7.1 g/dL (ref 6.5–8.1)

## 2016-01-24 NOTE — Progress Notes (Signed)
No show

## 2016-01-24 NOTE — Assessment & Plan Note (Deleted)
Recurrent peritoneal carcinomatosis after initially being treated with Carboplatin/Paclitaxel beginning on [id number] complicated by Cremophor reaction resulting in a change in therapy on 11/23/2014 to Carboplatin/Abraxane finishing on 02/23/2015.  Now with recurrent disease and increasing tumor marker.  She began Carboplatin/Abraxane therapy again in the recurrent setting on 01/11/2016 given that she was last treated with this regimen 11 months prior (thus a re-challenge is indicated).  Oncology history is up to date.  She notes back pain that is well controlled with transdermal lidocaine.  She has an upcoming appointment with Neurosurgery. MRI from July 2016 of L-spine demonstrated a neural foraminal stenosis at L4-L5. She has a MRI of L-spine scheduled for 01/31/2016 at 6 PM. This is ordered by Dr. Luan Pulling and we will be vigilant for results as well.  On her last encounter last week, she was started on Paxil 10 mg by Dr. Whitney Muse.  Pre-treatment labs today meet parameters for chemotherapy tomorrow, 01/25/2016.  Return as scheduled for follow-up and treatment.

## 2016-01-25 ENCOUNTER — Encounter (HOSPITAL_BASED_OUTPATIENT_CLINIC_OR_DEPARTMENT_OTHER): Payer: Medicare HMO

## 2016-01-25 ENCOUNTER — Encounter (HOSPITAL_COMMUNITY): Payer: Self-pay

## 2016-01-25 ENCOUNTER — Encounter (HOSPITAL_COMMUNITY): Payer: Self-pay | Admitting: Oncology

## 2016-01-25 ENCOUNTER — Encounter (HOSPITAL_BASED_OUTPATIENT_CLINIC_OR_DEPARTMENT_OTHER): Payer: Medicare HMO | Admitting: Oncology

## 2016-01-25 VITALS — BP 132/68 | HR 80 | Temp 98.2°F | Resp 18 | Wt 176.0 lb

## 2016-01-25 DIAGNOSIS — C482 Malignant neoplasm of peritoneum, unspecified: Secondary | ICD-10-CM

## 2016-01-25 DIAGNOSIS — R05 Cough: Secondary | ICD-10-CM | POA: Diagnosis not present

## 2016-01-25 DIAGNOSIS — Z5111 Encounter for antineoplastic chemotherapy: Secondary | ICD-10-CM | POA: Diagnosis not present

## 2016-01-25 MED ORDER — PACLITAXEL PROTEIN-BOUND CHEMO INJECTION 100 MG
80.0000 mg/m2 | Freq: Once | INTRAVENOUS | Status: AC
  Administered 2016-01-25: 150 mg via INTRAVENOUS
  Filled 2016-01-25: qty 30

## 2016-01-25 MED ORDER — FOSAPREPITANT DIMEGLUMINE INJECTION 150 MG
Freq: Once | INTRAVENOUS | Status: AC
Start: 2016-01-25 — End: 2016-01-25
  Administered 2016-01-25: 12:00:00 via INTRAVENOUS
  Filled 2016-01-25: qty 5

## 2016-01-25 MED ORDER — PALONOSETRON HCL INJECTION 0.25 MG/5ML
INTRAVENOUS | Status: AC
  Filled 2016-01-25: qty 5

## 2016-01-25 MED ORDER — SODIUM CHLORIDE 0.9% FLUSH
10.0000 mL | INTRAVENOUS | Status: DC | PRN
  Administered 2016-01-25: 10 mL
  Filled 2016-01-25: qty 10

## 2016-01-25 MED ORDER — HEPARIN SOD (PORK) LOCK FLUSH 100 UNIT/ML IV SOLN
500.0000 [IU] | Freq: Once | INTRAVENOUS | Status: AC | PRN
  Administered 2016-01-25: 500 [IU]
  Filled 2016-01-25: qty 5

## 2016-01-25 MED ORDER — SODIUM CHLORIDE 0.9 % IV SOLN
Freq: Once | INTRAVENOUS | Status: AC
  Administered 2016-01-25: 11:00:00 via INTRAVENOUS

## 2016-01-25 MED ORDER — SODIUM CHLORIDE 0.9 % IV SOLN
158.6000 mg | Freq: Once | INTRAVENOUS | Status: AC
  Administered 2016-01-25: 160 mg via INTRAVENOUS
  Filled 2016-01-25: qty 16

## 2016-01-25 MED ORDER — PALONOSETRON HCL INJECTION 0.25 MG/5ML
0.2500 mg | Freq: Once | INTRAVENOUS | Status: AC
  Administered 2016-01-25: 0.25 mg via INTRAVENOUS

## 2016-01-25 NOTE — Assessment & Plan Note (Addendum)
Recurrent peritoneal carcinomatosis after initially being treated with Carboplatin/Paclitaxel beginning on 89/33/8826 complicated by Cremophor reaction resulting in a change in therapy on 11/23/2014 to Carboplatin/Abraxane finishing on 02/23/2015.  Now with recurrent disease and increasing tumor marker.  She began Carboplatin/Abraxane therapy again in the recurrent setting on 01/11/2016 given that she was last treated with this regimen 11 months prior (thus a re-challenge is indicated).  Oncology history is up to date.  She notes back pain that is well controlled with transdermal lidocaine.  She has an upcoming appointment with Neurosurgery. MRI from July 2016 of L-spine demonstrated a neural foraminal stenosis at L4-L5. She has a MRI of L-spine scheduled for 01/31/2016 at 6 PM. This is ordered by Dr. Luan Pulling and we will be vigilant for results as well.  On her last encounter last week, she was started on Paxil 10 mg by Dr. Whitney Muse.  Margaux does not yet notice any change in her mental health.  I have asked her to give it one more week and if not better, we should consider increasing her Paxil dose.  Pre-treatment labs yesterday met treatment parameters for today.    She notes a cough.  This is improved since her ED visit ~ 2 weeks ago, but persists.  She notes that it is productive of clear/white sputum.  She denies any fevers and is afebrile today.  She just finished a 7 day course of Levaquin on 2/20.  She notes that she cannot cough up the sputum and therefore this causes a cough.  I have encouraged her to try an expectorant such as mucinex or robitussin DM.   This is typed on her discharge papers.  She is encouraged to drink plenty of H2O with these medications as they are most effective with increased H2O intake.    She notes increased fatigue with chemotherapy.  We will need to monitor this very closely and alter dosing of treatment, if indicated, as I am fearful that we may negatively affect her  QOL.  Return as scheduled for follow-up.

## 2016-01-25 NOTE — Progress Notes (Signed)
Tolerated chemo without problems. Vitals stable on discharge. She continues to have back pain, more severe at times. Rates a 9 after up to bathroom. She is set up for MRI next week. Discharged via wheel chair with her friend.

## 2016-01-25 NOTE — Progress Notes (Signed)
Sarah Bogus, MD [place]  [id number]  Primary peritoneal carcinomatosis [facility])  CURRENT THERAPY: Carboplatin/Abraxane days 1, 8, 15 every 21 days beginning on 01/11/2016  INTERVAL HISTORY: [name] 81 y.o. female returns for followup of recurrent peritoneal carcinomatosis.    Primary peritoneal carcinomatosis ([name])   07/25/2014 Pathology Results Left pleural effusion positive for malignant cell consistent with adenocarcinoma.   07/27/2014 - 08/05/2014 Hospital Admission Heart failure with malignant pleural effusion   07/28/2014 Pathology Results Left pleural effusion positive for malignant cells consistent with adenocarcinoma (CK7 pos, CK20 neg, WT-1 pos, ER weakly pos, PR neg, TTF-1 neg, GCDFP-15 neg).  Findings favor gyn/ovarian primary.   08/01/2014 Pathology Results [facility]- Postive genomic findings: BRCA2 [id number], STK11 loss, MYC amplification-equivocal, TP53 R273C, MLL2 [id number].  Targeted treatments include Olaparib (BRCA2) and Everolimus/Temsirolimus (STK11).   08/03/2014 Tumor Marker CA 125 4954   08/04/2014 Procedure Left pleurX catheter by Dr. VLucianne LeiTright   09/08/2014 - 09/14/2014 Hospital Admission Hospitalized with the following main issues: acute on chronic CHF, acute on chronic respiratory failure with hypoxia, and malignant plueral effusion on right    09/13/2014 Procedure Right PleurX catheter by Dr. BCyndia Bent  09/13/2014 Pathology Results Right pleural effusion positive for malignant adenocarcinoma   09/21/2014 Tumor Marker CA 125 [id number]   09/22/2014 - 11/17/2014 Chemotherapy Carboplatin/Paclitaxel days 1, 8, 15 every 28 days.   10/20/2014 Tumor Marker CA 125 335   11/23/2014 - 02/23/2015 Chemotherapy Carboplatin/Abraxane day 1, 8, 15 every 28 days.   06/06/2015 Tumor Marker CA 125: 21.7    08/04/2015 Tumor Marker CA 125: 23.6    08/17/2015 Imaging CT abd/pelvis- Resolution of ascites and pleural effusions since previous study. Near  complete resolution of soft tissue stranding and nodularity in the omental and mesenteric fat.  Resolution of left inguinal lymphadenopathy since prior exam.   10/10/2015 Tumor Marker CA 125: 47.4 (H)    11/30/2015 Tumor Marker CA 125: 101.2 (H)   12/25/2015 Tumor Marker CA 125: 118.4 (H)    12/29/2015 Progression    12/29/2015 Imaging CT CAP- progression of peritoneal carcinomatosis, with interval increase in number and size of numerous large lesions predominantly in the low anatomic pelvis   01/11/2016 -  Chemotherapy Carboplatin/Abraxane days 1, 8, 15 every 21 days. Avastin    I personally reviewed and went over laboratory results with the patient.  The results are noted within this dictation. Labs are updated today in preparation for her next treatment which is scheduled for tomorrow, 01/25/2016.   She was scheduled for an MRI of her lumbar spine on 01/19/2016 but this has been rescheduled to 01/31/2016. This is ordered by Dr. HLuan Pullingand we will keep an eye out for these results as well. She also notes an upcoming appointment with neurosurgery for further evaluation as well for her back pain.  Her last encounter she was prescribed Paxil by Dr. PWhitney Muse She was started on 10 mg daily. Patient reports that she does not know as a difference. I've asked her to continue with his medication for another week. If no improvement that point in time, we could consider increasing her Paxil.  She uses the word "hopeless" to describe her attitude.  She reports a cough is persistent. She recently finished Levaquin antibiotic. She finished that on 01/22/2016. She notes it is much improved since completing that antibiotic.  Her cough is nonproductive. She notes phlegm getting stuck in her throat causing a tickle,  causing continued cough. I did not witness any coughing during our discussion today. She reports that when she is able to clear her throat to bring sputum up, it is clear/white. She denies any fevers at home.  She is afebrile in the clinic today.   She notes increased fatigue. This is likely secondary to chemotherapy. When I interviewed the patient today, she was already finished with her paclitaxel. She notes that her appetite comes and goes. Her weight is stable. She denies any nausea or vomiting. She denies any diarrhea or constipation. She continues with back pain. She denies any new pains.  Past Medical History  Diagnosis Date  . GERD (gastroesophageal reflux disease)   . Osteoarthritis   . Peripheral edema   . CHF (congestive heart failure), NYHA class IV (HCC)     diastolic  . UTI (lower urinary tract infection)   . Cancer ([place])   . Ovarian cancer ([place])   . DNR (do not resuscitate) 01/24/2016    09/14/2014    has GERD; CONSTIPATION, CHRONIC; HEMATOCHEZIA; OSTEOARTHRITIS, LOWER LEG; KNEE PAIN; BURSITIS, KNEE; Dysphagia; Malignant pleural effusion; Edema of both legs; Heart failure, diastolic, with acute decompensation ([place]); Hyperkalemia; Anemia, unspecified; Rectal bleed; Chest pain; Protein-calorie malnutrition, severe ([place]); Dysphagia, pharyngoesophageal phase; Constipation; Acute on chronic congestive heart failure ([place]); Acute on chronic respiratory failure with hypoxia ([place]); Primary peritoneal carcinomatosis ([place]); Anemia due to chemotherapy; Cellulitis; Genetic testing; and DNR (do not resuscitate) on her problem list.     is allergic to penicillins.  Current Outpatient Prescriptions on File Prior to Visit  Medication Sig Dispense Refill  . Bevacizumab (AVASTIN IV) Inject into the vein. To be given Day 1 every 21 days    . CARBOPLATIN IV Inject into the vein. Days 1, 8, 15 every 21 days    . furosemide (LASIX) 20 MG tablet Take 20 mg by mouth every morning.     . gabapentin (NEURONTIN) 300 MG capsule Take 2 capsules (600 mg total) by mouth at bedtime. 60 capsule 2  . KLOR-CON M10 10 MEQ tablet Take 20 mEq by mouth 3 (three) times daily.     Marland Kitchen lactulose (CHRONULAC) 10 GM/15ML  solution Take 30 mLs (20 g total) by mouth 2 (two) times daily as needed for moderate constipation. 240 mL 0  . levofloxacin (LEVAQUIN) 500 MG tablet Take 1 tablet (500 mg total) by mouth daily. 7 tablet 0  . lidocaine-prilocaine (EMLA) cream Apply a quarter size amount to port site 1 hour prior to chemo. Do not rub in. Cover with plastic wrap. 30 g 3  . magic mouthwash w/lidocaine SOLN Take 5 mLs by mouth 4 (four) times daily -  before meals and at bedtime. 360 mL 0  . metoCLOPramide (REGLAN) 5 MG tablet Take 5 mg by mouth 4 (four) times daily.    . mometasone (ELOCON) 0.1 % cream Apply 1 application topically daily. To affected areas (arms and hands)    . ondansetron (ZOFRAN ODT) 4 MG disintegrating tablet 57m ODT q4 hours prn nausea/vomit 12 tablet 0  . ondansetron (ZOFRAN) 8 MG tablet Take 1 tablet (8 mg total) by mouth every 8 (eight) hours as needed for nausea or vomiting. 30 tablet 2  . oxyCODONE (ROXICODONE) 5 MG immediate release tablet Take two tablet by mouth every 4 hours as needed for pain (Patient taking differently: Take 10 mg by mouth every 4 (four) hours as needed for moderate pain or severe pain. ) 90 tablet 0  . oxyCODONE-acetaminophen (PERCOCET/ROXICET)  5-325 MG tablet Take 1 tablet by mouth every 6 (six) hours as needed. 20 tablet 0  . PACLitaxel Protein-Bound Part (ABRAXANE IV) Inject into the vein. Days 1, 8, 15 every 21 days    . pantoprazole (PROTONIX) 40 MG tablet Take 1 tablet (40 mg total) by mouth daily. 30 tablet 0  . PARoxetine (PAXIL) 10 MG tablet Take 1 tablet (10 mg total) by mouth daily. 30 tablet 3  . predniSONE (DELTASONE) 10 MG tablet Take 1 tablet (10 mg total) by mouth daily with breakfast. 30 tablet 1  . prochlorperazine (COMPAZINE) 10 MG tablet Take 1 tablet (10 mg total) by mouth every 6 (six) hours as needed for nausea or vomiting. 30 tablet 2   Current Facility-Administered Medications on File Prior to Visit  Medication Dose Route Frequency Provider Last  Rate Last Dose  . sodium chloride flush (NS) 0.9 % injection 10 mL  10 mL Intracatheter PRN Patrici Ranks, MD   10 mL at 01/25/16 1118    Past Surgical History  Procedure Laterality Date  . Cholecystectomy    . Cardiac catheterization    . Total abdominal hysterectomy  40 years ago    patient does not know if ovaries were removed  . Appendectomy    . Total knee arthroplasty Left   . Chest tube insertion Left 08/04/2014    Procedure: INSERTION PLEURAL DRAINAGE CATHETER;  Surgeon: Ivin Poot, MD;  Location: [place];  Service: Thoracic;  Laterality: Left;  . Chest tube insertion Right 09/13/2014    Procedure: INSERTION PLEURAL DRAINAGE CATHETER;  Surgeon: Gaye Pollack, MD;  Location: [place];  Service: Thoracic;  Laterality: Right;  . Removal of pleural drainage catheter Bilateral 12/06/2014    Procedure: REMOVAL OF PLEURAL DRAINAGE CATHETER;  Surgeon: Ivin Poot, MD;  Location: [facility];  Service: Thoracic;  Laterality: Bilateral;    Denies any headaches, dizziness, double vision, fevers, chills, night sweats, nausea, vomiting, diarrhea, constipation, chest pain, heart palpitations, shortness of breath, blood in stool, black tarry stool, urinary pain, urinary burning, urinary frequency, hematuria.   PHYSICAL EXAMINATION  ECOG PERFORMANCE STATUS: 1 - Symptomatic but completely ambulatory  There were no vitals filed for this visit.  Blood pressure is 132/68 Pulse is 80 Temperature is 98.48F Respirations is 18 Weight is 176 pounds  GENERAL:alert, well nourished, well developed, comfortable, cooperative, smiling and accompanied by her Significant other.  She is in a chemotherapy finished with paclitaxel.  SKIN: skin color, texture, turgor are normal, no rashes or significant lesions HEAD: Normocephalic, No masses, lesions, tenderness or abnormalities EYES: normal, PERRLA, EOMI, Conjunctiva are pink and non-injected EARS: External ears normal OROPHARYNX:lips, buccal mucosa, and  tongue normal and mucous membranes are moist  NECK: supple, trachea midline LYMPH:  not examined BREAST:not examined LUNGS: Clear to auscultation bilaterally without any wheezes, rales, or rhonchi. ABDOMEN:abdomen soft and normal bowel sounds, nontender. BACK: Back symmetric, no curvature. EXTREMITIES:less then 2 second capillary refill, no joint deformities, effusion, or inflammation, no skin discoloration  NEURO: alert & oriented x 3 with fluent speech, no focal motor/sensory deficits   LABORATORY DATA: CBC    Component Value Date/Time   WBC 4.8 01/24/2016 1135   RBC 4.89 01/24/2016 1135   RBC 4.29 07/27/2014 2257   HGB 11.8* 01/24/2016 1135   HCT 38.3 01/24/2016 1135   PLT 233 01/24/2016 1135   MCV 78.3 01/24/2016 1135   MCH 24.1* 01/24/2016 1135   MCHC 30.8 01/24/2016 1135   RDW  17.9* 01/24/2016 1135   LYMPHSABS 1.5 01/24/2016 1135   MONOABS 0.2 01/24/2016 1135   EOSABS 0.1 01/24/2016 1135   BASOSABS 0.0 01/24/2016 1135      Chemistry      Component Value Date/Time   NA 141 01/24/2016 1135   K 3.9 01/24/2016 1135   CL 106 01/24/2016 1135   CO2 28 01/24/2016 1135   BUN 25* 01/24/2016 1135   CREATININE 1.10* 01/24/2016 1135      Component Value Date/Time   CALCIUM 8.5* 01/24/2016 1135   ALKPHOS 57 01/24/2016 1135   AST 13* 01/24/2016 1135   ALT 9* 01/24/2016 1135   BILITOT 0.3 01/24/2016 1135      Lab Results  Component Value Date   CA125 118.4* 12/25/2015     PENDING LABS:   RADIOGRAPHIC STUDIES:  Dg Chest 2 View  01/15/2016  CLINICAL DATA:  Shortness of breath with productive cough and weakness for 2 days, low back pain EXAM: CHEST  2 VIEW COMPARISON:  04/06/2015 FINDINGS: Mild cardiac enlargement stable. Stable right Port-A-Cath. Vascular pattern normal. Mild scarring or atelectasis at left lung base stable. Opacity over the right diaphragm. Blunting bilateral costophrenic angles stable. IMPRESSION: Tiny pleural effusions smaller when compared to  prior studies. Right lower lobe infiltrate or atelectasis. Pneumonia not excluded. Electronically Signed   By: Skipper Cliche M.D.   On: 01/15/2016 21:29   Dg Lumbar Spine Complete  01/15/2016  CLINICAL DATA:  Low back pain for 1 month. No injury. Pain radiates down both legs. EXAM: LUMBAR SPINE - COMPLETE 4+ VIEW COMPARISON:  09/28/2015 FINDINGS: Diffuse bone demineralization. Normal alignment. Diffuse degenerative changes with narrowed interspaces and endplate hypertrophic changes. No vertebral compression deformities. No focal bone lesion or bone destruction. Bone cortex appears intact. Degenerative changes in the facet joints with normal alignment. Visualize sacrum appears grossly intact. Vascular calcifications in the aorta. Surgical clips in the right upper quadrant. IMPRESSION: Diffuse bone demineralization with degenerative changes in the lumbar spine. Normal alignment. No acute displaced fractures identified. Electronically Signed   By: Lucienne Capers M.D.   On: 01/15/2016 21:31   Ct Chest W Contrast  12/29/2015  CLINICAL DATA:  81 year old female with history of primary peritoneal carcinomatosis (adenocarcinoma) status post chemotherapy (last treatment 2 weeks ago). Shortness of breath and cough for the past year. Nausea for the past year. EXAM: CT CHEST, ABDOMEN, AND PELVIS WITH CONTRAST TECHNIQUE: Multidetector CT imaging of the chest, abdomen and pelvis was performed following the standard protocol during bolus administration of intravenous contrast. CONTRAST:  128m OMNIPAQUE IOHEXOL 300 MG/ML  SOLN COMPARISON:  CT the abdomen and pelvis 08/17/2015. CT of the chest, abdomen and pelvis 07/29/2014. Multiple other prior examinations. FINDINGS: CT CHEST FINDINGS Mediastinum/Lymph Nodes: Heart size is normal. There is no significant pericardial fluid, thickening or pericardial calcification. There is atherosclerosis of the thoracic aorta, the great vessels of the mediastinum and the coronary  arteries, including calcified atherosclerotic plaque in the left main, left anterior descending, left circumflex and right coronary arteries. No pathologically enlarged mediastinal or hilar lymph nodes. Large hiatal hernia. Right internal jugular single-lumen porta cath with tip terminating in the distal superior vena cava. No axillary lymphadenopathy. Lungs/Pleura: Tiny 2 mm subpleural nodule in the periphery of the right middle lobe (image 34 of series 7), unchanged compared to prior study 07/29/2014, considered benign, presumably a subpleural lymph node. No other larger more suspicious appearing pulmonary nodules or masses are identified at this time. Areas of linear scarring are  noted throughout the lung bases bilaterally. No acute consolidative airspace disease. No pleural effusions. Musculoskeletal/Soft Tissues: There are no aggressive appearing lytic or blastic lesions noted in the visualized portions of the skeleton. CT ABDOMEN AND PELVIS FINDINGS Hepatobiliary: No suspicious appearing cystic or solid hepatic lesions. Status post cholecystectomy. Mild intrahepatic biliary ductal dilatation is unchanged, as is mild extrahepatic biliary ductal dilatation (common bile duct measures up to 12 mm in the porta hepatis), presumably reflective of benign post cholecystectomy physiology. Pancreas: No pancreatic mass. No pancreatic ductal dilatation. No pancreatic or peripancreatic fluid or inflammatory changes. Spleen: Unremarkable. Adrenals/Urinary Tract: Bilateral adrenal glands are normal in appearance. Multifocal cortical thinning throughout the kidneys bilaterally, in addition to mild bilateral renal atrophy. No suspicious renal lesions. No hydroureteronephrosis. Urinary bladder is partially decompressed, but unremarkable in appearance. Stomach/Bowel: Intraabdominal portion of the stomach is normal. No pathologic dilatation of small bowel or colon. Extensive colonic diverticulosis is noted, most severe throughout  the descending and sigmoid colon, without surrounding inflammatory changes to suggest an acute diverticulitis at this time. Vascular/Lymphatic: Atherosclerotic calcifications throughout the abdominal and pelvic vasculature, without evidence of aneurysm or dissection. No lymphadenopathy noted in the abdomen or pelvis. Reproductive: Status post total abdominal hysterectomy. Ovaries are not confidently identified and may be surgically absent or atrophic. Other: Compared to the prior examination from 08/17/2015 the abnormal fluid collections and soft tissue masses in the low anatomic pelvis have significantly increased in size. The largest of these lesions now include a 6.0 x 4.8 cm heterogeneously enhancing multi-cystic lesion along the right pelvic sidewall (image 102 of series 2), and a similar appearing lesion measuring 4.2 x 7.1 cm immediately inferior to the proximal sigmoid colon (image 106 of series 2), which is inseparable from the colon on coronal and sagittal reformatted images. Several other smaller collections are also noted in the pelvis, all of which are larger than the prior examination. Some slight haziness is also noted in the omentum, best appreciated to the left of midline on images 71 and 73 of series 2, which could represent early omental involvement. No significant volume of free flowing ascites. No pneumoperitoneum. Musculoskeletal: There are no aggressive appearing lytic or blastic lesions noted in the visualized portions of the skeleton. IMPRESSION: 1. Today's study demonstrates progression of peritoneal carcinomatosis, with interval increase in number and size of numerous large lesions predominantly in the low anatomic pelvis, as detailed above. 2. No evidence of thoracic metastatic disease. 3. Atherosclerosis, including left main and 3 vessel coronary artery disease. Assessment for potential risk factor modification, dietary therapy or pharmacologic therapy may be warranted, if clinically  indicated. 4. Large hiatal hernia. 5. Additional incidental findings, similar prior studies, as above. Electronically Signed   By: Vinnie Langton M.D.   On: 12/29/2015 16:04   Ct Abdomen Pelvis W Contrast  12/29/2015  CLINICAL DATA:  81 year old female with history of primary peritoneal carcinomatosis (adenocarcinoma) status post chemotherapy (last treatment 2 weeks ago). Shortness of breath and cough for the past year. Nausea for the past year. EXAM: CT CHEST, ABDOMEN, AND PELVIS WITH CONTRAST TECHNIQUE: Multidetector CT imaging of the chest, abdomen and pelvis was performed following the standard protocol during bolus administration of intravenous contrast. CONTRAST:  164m OMNIPAQUE IOHEXOL 300 MG/ML  SOLN COMPARISON:  CT the abdomen and pelvis 08/17/2015. CT of the chest, abdomen and pelvis 07/29/2014. Multiple other prior examinations. FINDINGS: CT CHEST FINDINGS Mediastinum/Lymph Nodes: Heart size is normal. There is no significant pericardial fluid, thickening or pericardial calcification. There  is atherosclerosis of the thoracic aorta, the great vessels of the mediastinum and the coronary arteries, including calcified atherosclerotic plaque in the left main, left anterior descending, left circumflex and right coronary arteries. No pathologically enlarged mediastinal or hilar lymph nodes. Large hiatal hernia. Right internal jugular single-lumen porta cath with tip terminating in the distal superior vena cava. No axillary lymphadenopathy. Lungs/Pleura: Tiny 2 mm subpleural nodule in the periphery of the right middle lobe (image 34 of series 7), unchanged compared to prior study 07/29/2014, considered benign, presumably a subpleural lymph node. No other larger more suspicious appearing pulmonary nodules or masses are identified at this time. Areas of linear scarring are noted throughout the lung bases bilaterally. No acute consolidative airspace disease. No pleural effusions. Musculoskeletal/Soft Tissues:  There are no aggressive appearing lytic or blastic lesions noted in the visualized portions of the skeleton. CT ABDOMEN AND PELVIS FINDINGS Hepatobiliary: No suspicious appearing cystic or solid hepatic lesions. Status post cholecystectomy. Mild intrahepatic biliary ductal dilatation is unchanged, as is mild extrahepatic biliary ductal dilatation (common bile duct measures up to 12 mm in the porta hepatis), presumably reflective of benign post cholecystectomy physiology. Pancreas: No pancreatic mass. No pancreatic ductal dilatation. No pancreatic or peripancreatic fluid or inflammatory changes. Spleen: Unremarkable. Adrenals/Urinary Tract: Bilateral adrenal glands are normal in appearance. Multifocal cortical thinning throughout the kidneys bilaterally, in addition to mild bilateral renal atrophy. No suspicious renal lesions. No hydroureteronephrosis. Urinary bladder is partially decompressed, but unremarkable in appearance. Stomach/Bowel: Intraabdominal portion of the stomach is normal. No pathologic dilatation of small bowel or colon. Extensive colonic diverticulosis is noted, most severe throughout the descending and sigmoid colon, without surrounding inflammatory changes to suggest an acute diverticulitis at this time. Vascular/Lymphatic: Atherosclerotic calcifications throughout the abdominal and pelvic vasculature, without evidence of aneurysm or dissection. No lymphadenopathy noted in the abdomen or pelvis. Reproductive: Status post total abdominal hysterectomy. Ovaries are not confidently identified and may be surgically absent or atrophic. Other: Compared to the prior examination from 08/17/2015 the abnormal fluid collections and soft tissue masses in the low anatomic pelvis have significantly increased in size. The largest of these lesions now include a 6.0 x 4.8 cm heterogeneously enhancing multi-cystic lesion along the right pelvic sidewall (image 102 of series 2), and a similar appearing lesion  measuring 4.2 x 7.1 cm immediately inferior to the proximal sigmoid colon (image 106 of series 2), which is inseparable from the colon on coronal and sagittal reformatted images. Several other smaller collections are also noted in the pelvis, all of which are larger than the prior examination. Some slight haziness is also noted in the omentum, best appreciated to the left of midline on images 71 and 73 of series 2, which could represent early omental involvement. No significant volume of free flowing ascites. No pneumoperitoneum. Musculoskeletal: There are no aggressive appearing lytic or blastic lesions noted in the visualized portions of the skeleton. IMPRESSION: 1. Today's study demonstrates progression of peritoneal carcinomatosis, with interval increase in number and size of numerous large lesions predominantly in the low anatomic pelvis, as detailed above. 2. No evidence of thoracic metastatic disease. 3. Atherosclerosis, including left main and 3 vessel coronary artery disease. Assessment for potential risk factor modification, dietary therapy or pharmacologic therapy may be warranted, if clinically indicated. 4. Large hiatal hernia. 5. Additional incidental findings, similar prior studies, as above. Electronically Signed   By: Vinnie Langton M.D.   On: 12/29/2015 16:04     PATHOLOGY:    ASSESSMENT AND  PLAN:  Primary peritoneal carcinomatosis ([place]) Recurrent peritoneal carcinomatosis after initially being treated with Carboplatin/Paclitaxel beginning on 01/60/1093 complicated by Cremophor reaction resulting in a change in therapy on 11/23/2014 to Carboplatin/Abraxane finishing on 02/23/2015.  Now with recurrent disease and increasing tumor marker.  She began Carboplatin/Abraxane therapy again in the recurrent setting on 01/11/2016 given that she was last treated with this regimen 11 months prior (thus a re-challenge is indicated).  Oncology history is up to date.  She notes back pain that is well  controlled with transdermal lidocaine.  She has an upcoming appointment with Neurosurgery. MRI from July 2016 of L-spine demonstrated a neural foraminal stenosis at L4-L5. She has a MRI of L-spine scheduled for 01/31/2016 at 6 PM. This is ordered by Dr. Luan Pulling and we will be vigilant for results as well.  On her last encounter last week, she was started on Paxil 10 mg by Dr. Whitney Muse.  Renarda does not yet notice any change in her mental health.  I have asked her to give it one more week and if not better, we should consider increasing her Paxil dose.  Pre-treatment labs yesterday met treatment parameters for today.    She notes a cough.  This is improved since her ED visit ~ 2 weeks ago, but persists.  She notes that it is productive of clear/white sputum.  She denies any fevers and is afebrile today.  She just finished a 7 day course of Levaquin on 2/20.  She notes that she cannot cough up the sputum and therefore this causes a cough.  I have encouraged her to try an expectorant such as mucinex or robitussin DM.   This is typed on her discharge papers.  She is encouraged to drink plenty of H2O with these medications as they are most effective with increased H2O intake.    She notes increased fatigue with chemotherapy.  We will need to monitor this very closely and alter dosing of treatment, if indicated, as I am fearful that we may negatively affect her QOL.  Return as scheduled for follow-up.    THERAPY PLAN:  Continue with systemic chemotherapy consisting of Carboplatin/Abraxane.  All questions were answered. The patient knows to call the clinic with any problems, questions or concerns. We can certainly see the patient much sooner if necessary.  Patient and plan discussed with Dr. Ancil Linsey and she is in agreement with the aforementioned.   This note is electronically signed by: Doy Mince 01/25/2016 4:41 PM

## 2016-01-25 NOTE — Patient Instructions (Signed)
[place] at [facility] Discharge Instructions  RECOMMENDATIONS MADE BY THE CONSULTANT AND ANY TEST RESULTS WILL BE SENT TO YOUR REFERRING PHYSICIAN.  Exam and discussion today with Kirby Crigler, PA. For mucous relief use over the counter Robitussin DM and Mucinex - BE SURE TO DRINK PLENTY OF WATER WITH THESE MEDICATIONS - THIS WILL MAKE THEM MORE EFFECTIVE. Return as scheduled for treatment and office visit. Please call the clinic if you have any questions or concerns.   Thank you for choosing [place] at [facility] to provide your oncology and hematology care.  To afford each patient quality time with our provider, please arrive at least 15 minutes before your scheduled appointment time.   Beginning January 23rd 2017 lab work for the [facility] will be done in the  Main lab at [facility] on 1st floor. If you have a lab appointment with the [facility] please come in thru the  Main Entrance and check in at the main information desk  You need to re-schedule your appointment should you arrive 10 or more minutes late.  We strive to give you quality time with our providers, and arriving late affects you and other patients whose appointments are after yours.  Also, if you no show three or more times for appointments you may be dismissed from the clinic at the providers discretion.     Again, thank you for choosing [facility].  Our hope is that these requests will decrease the amount of time that you wait before being seen by our physicians.       _____________________________________________________________  Should you have questions after your visit to [facility], please contact our office at [phone] between the hours of 8:30 a.m. and 4:30 p.m.  Voicemails left after 4:30 p.m. will not be returned until the following business day.  For prescription refill requests, have your pharmacy contact our office.

## 2016-01-31 ENCOUNTER — Ambulatory Visit (HOSPITAL_COMMUNITY): Payer: Medicare HMO | Admitting: Hematology & Oncology

## 2016-01-31 ENCOUNTER — Ambulatory Visit (HOSPITAL_COMMUNITY)
Admission: RE | Admit: 2016-01-31 | Discharge: 2016-01-31 | Disposition: A | Discharge status: Home / Self Care | Payer: Medicare HMO | Source: Ambulatory Visit | Attending: Pulmonary Disease | Admitting: Pulmonary Disease

## 2016-01-31 DIAGNOSIS — M899 Disorder of bone, unspecified: Secondary | ICD-10-CM | POA: Diagnosis not present

## 2016-01-31 DIAGNOSIS — M5127 Other intervertebral disc displacement, lumbosacral region: Secondary | ICD-10-CM | POA: Diagnosis not present

## 2016-01-31 DIAGNOSIS — M4806 Spinal stenosis, lumbar region: Secondary | ICD-10-CM | POA: Diagnosis not present

## 2016-01-31 DIAGNOSIS — M545 Low back pain: Secondary | ICD-10-CM | POA: Diagnosis present

## 2016-01-31 DIAGNOSIS — M4807 Spinal stenosis, lumbosacral region: Secondary | ICD-10-CM | POA: Insufficient documentation

## 2016-02-01 ENCOUNTER — Encounter (HOSPITAL_COMMUNITY): Payer: Medicare HMO | Attending: Hematology | Admitting: Hematology & Oncology

## 2016-02-01 ENCOUNTER — Ambulatory Visit (HOSPITAL_COMMUNITY): Payer: Medicare HMO | Admitting: Hematology & Oncology

## 2016-02-01 ENCOUNTER — Encounter (HOSPITAL_COMMUNITY): Payer: Self-pay | Admitting: Hematology & Oncology

## 2016-02-01 VITALS — BP 134/79 | HR 80 | Temp 97.7°F | Resp 18 | Wt >= 6400 oz

## 2016-02-01 DIAGNOSIS — F32A Depression, unspecified: Secondary | ICD-10-CM

## 2016-02-01 DIAGNOSIS — C482 Malignant neoplasm of peritoneum, unspecified: Secondary | ICD-10-CM | POA: Insufficient documentation

## 2016-02-01 DIAGNOSIS — F329 Major depressive disorder, single episode, unspecified: Secondary | ICD-10-CM

## 2016-02-01 DIAGNOSIS — Z95828 Presence of other vascular implants and grafts: Secondary | ICD-10-CM

## 2016-02-01 DIAGNOSIS — Z66 Do not resuscitate: Secondary | ICD-10-CM

## 2016-02-01 DIAGNOSIS — R319 Hematuria, unspecified: Secondary | ICD-10-CM

## 2016-02-01 DIAGNOSIS — D631 Anemia in chronic kidney disease: Secondary | ICD-10-CM | POA: Insufficient documentation

## 2016-02-01 DIAGNOSIS — N189 Chronic kidney disease, unspecified: Secondary | ICD-10-CM | POA: Insufficient documentation

## 2016-02-01 DIAGNOSIS — C569 Malignant neoplasm of unspecified ovary: Secondary | ICD-10-CM | POA: Insufficient documentation

## 2016-02-01 MED ORDER — PAROXETINE HCL 20 MG PO TABS: 20.0000 mg | ORAL_TABLET | Freq: Every day | ORAL | Status: DC

## 2016-02-01 NOTE — Progress Notes (Signed)
Sarah Bogus, MD [place]   [id number]    Primary peritoneal carcinomatosis [facility])   07/25/2014 Pathology Results Left pleural effusion positive for malignant cell consistent with adenocarcinoma.   07/27/2014 - 08/05/2014 Hospital Admission Heart failure with malignant pleural effusion   07/28/2014 Pathology Results Left pleural effusion positive for malignant cells consistent with adenocarcinoma (CK7 pos, CK20 neg, WT-1 pos, ER weakly pos, PR neg, TTF-1 neg, GCDFP-15 neg).  Findings favor gyn/ovarian primary.   08/01/2014 Pathology Results [facility]- Post[facility] genomic findings: BRCA2 [id number]*31, STK11 loss, MYC amplification-equivocal, TP53 [id number], MLL2 [id number].  Targeted treatments include Olaparib (BRCA2) and Everolimus/Temsirolimus (STK11).   08/03/2014 Tumor Marker CA 125 4954   08/04/2014 Procedure Left pleurX catheter by Dr. VLucianne LeiTright   09/08/2014 - 09/14/2014 Hospital Admission Hospitalized with the following main issues: acute on chronic CHF, acute on chronic respiratory failure with hypoxia, and malignant plueral effusion on right    09/13/2014 Procedure Right PleurX catheter by Dr. BCyndia Bent  09/13/2014 Pathology Results Right pleural effusion positive for malignant adenocarcinoma   09/21/2014 Tumor Marker CA 125 3869   09/22/2014 - 11/17/2014 Chemotherapy Carboplatin/Paclitaxel days 1, 8, 15 every 28 days.   10/20/2014 Tumor Marker CA 125 335   11/23/2014 - 02/23/2015 Chemotherapy Carboplatin/Abraxane day 1, 8, 15 every 28 days.   06/06/2015 Tumor Marker CA 125: 21.7    08/04/2015 Tumor Marker CA 125: 23.6    08/17/2015 Imaging CT abd/pelvis- Resolution of ascites and pleural effusions since previous study. Near complete resolution of soft tissue stranding and nodularity in the omental and mesenteric fat.  Resolution of left inguinal lymphadenopathy since prior exam.   10/10/2015 Tumor Marker CA 125: 47.4 (H)    11/30/2015 Tumor Marker CA 125: 101.2 (H)   12/25/2015 Tumor Marker CA 125: 118.4 (H)    12/29/2015 Progression    12/29/2015 Imaging CT CAP- progression of peritoneal carcinomatosis, with interval increase in number and size of numerous large lesions predominantly in the low anatomic pelvis   01/11/2016 -  Chemotherapy Carboplatin/Abraxane days 1, 8, 15 every 21 days. Avastin     INTERVAL HISTORY: Sarah CALVILLO81y.o. female returns for follow-up of her primary peritoneal carcinoma. She has restarted therapy.  [name] here with boyfriend and female friend today.   She says that she has had some good days and not good days. On the not good days she says that the day goes on forever but she reads and watches TV, but she does not sleep all day. On the good days she does not cough as much and goes out riding.   She has not been attending church as she is afraid to disturb with her cough or catch the flu. She states that she misses going to church.  She says that she eats pretty good.   She has been on Paxil for 2 weeks. Her boyfriend says that he has not seen a change in her mood and that she does not have much life to her and she does not talk as much. She is willing to go up on the dose.   She saw her Neurosurgeon yesterday [facility] She states that her back pain continues.  She says that she gets around "pretty good" and she walks with a cane.   She has been using topical estrogen cream to help with her urinary tract, this was given to her by Dr.  Dahlstadt. She "believes this has helped her urinary symptoms."  She denies chest pain, vomiting, nausea, and belly pain. She states that her bowels are good. Her breathing is baseline.   MEDICAL HISTORY: Past Medical History  Diagnosis Date  . GERD (gastroesophageal reflux disease)   . Osteoarthritis   . Peripheral edema   . CHF (congestive heart failure), NYHA class IV (HCC)     diastolic  . UTI (lower urinary tract infection)   . Cancer  ([place])   . Ovarian cancer ([place])   . DNR (do not resuscitate) 01/24/2016    09/14/2014    has GERD; CONSTIPATION, CHRONIC; HEMATOCHEZIA; OSTEOARTHRITIS, LOWER LEG; KNEE PAIN; BURSITIS, KNEE; Dysphagia; Malignant pleural effusion; Edema of both legs; Heart failure, diastolic, with acute decompensation ([place]); Hyperkalemia; Anemia, unspecified; Rectal bleed; Chest pain; Protein-calorie malnutrition, severe ([place]); Dysphagia, pharyngoesophageal phase; Constipation; Acute on chronic congestive heart failure ([place]); Acute on chronic respiratory failure with hypoxia ([place]); Primary peritoneal carcinomatosis ([place]); Anemia due to chemotherapy; Cellulitis; Genetic testing; and DNR (do not resuscitate) on her problem list.    is allergic to penicillins.  [name] does not currently have medications on file.  SURGICAL HISTORY: Past Surgical History  Procedure Laterality Date  . Cholecystectomy    . Cardiac catheterization    . Total abdominal hysterectomy  40 years ago    patient does not know if ovaries were removed  . Appendectomy    . Total knee arthroplasty Left   . Chest tube insertion Left 08/04/2014    Procedure: INSERTION PLEURAL DRAINAGE CATHETER;  Surgeon: Ivin Poot, MD;  Location: [facility];  Service: Thoracic;  Laterality: Left;  . Chest tube insertion Right 09/13/2014    Procedure: INSERTION PLEURAL DRAINAGE CATHETER;  Surgeon: Gaye Pollack, MD;  Location: [facility];  Service: Thoracic;  Laterality: Right;  . Removal of pleural drainage catheter Bilateral 12/06/2014    Procedure: REMOVAL OF PLEURAL DRAINAGE CATHETER;  Surgeon: Ivin Poot, MD;  Location: [facility];  Service: Thoracic;  Laterality: Bilateral;    SOCIAL HISTORY: Social History   Social History  . Marital Status: Widowed    Spouse Name: N/A  . Number of Children: 4  . Years of Education: N/A   Occupational History  . nurses aid    Social History Main Topics  . Smoking status: Never Smoker   . Smokeless tobacco: Not on  file  . Alcohol Use: No  . Drug Use: No  . Sexual Activity: Not on file   Other Topics Concern  . Not on file   Social History Narrative   FAMILY HISTORY: Noncontributory  Review of Systems  Constitutional: Negative for fever, chills, fatigue, and weight loss.  HENT: Negative for congestion, hearing loss, nosebleeds, sore throat and tinnitus.   Eyes: Negative for blurred vision, double vision, pain and discharge.  Respiratory: Positive for cough, sputum production Negative for hemoptysis, shortness of breath and wheezing.  Positive for difficulty breathing. (chronic) Cough is primarily in the morning and at night.  Cardiovascular: Negative for chest pain, palpitations, claudication, leg swelling and PND.  Gastrointestinal: Negative for heartburn, nausea, vomiting, abdominal pain, diarrhea, constipation, blood in stool and melena.  Genitourinary: Negative for hematuria, urgency, frequency, dysuria. Musculoskeletal: Positive for back pain. Negative for myalgias, joint pain and falls.  Skin: Negative for itching and rash.  Neurological: Negative for dizziness, tingling, tremors, speech change, focal weakness, sensory changes, seizures, loss of consciousness, weakness and headaches.  Endo/Heme/Allergies: Does not bruise/bleed easily.  Psychiatric/Behavioral:  Positive for confusion. Negative for depression, suicidal ideas, memory loss and substance abuse. The patient is not nervous/anxious and does not have insomnia.   14 point review of systems was performed and is negative except as detailed under history of present illness and above   PHYSICAL EXAMINATION ECOG PERFORMANCE STATUS: 1 - Symptomatic but completely ambulatory  Filed Vitals:   02/01/16 1110  BP: 134/79  Pulse: 80  Temp: 97.7 F (36.5 C)  Resp: 18   Physical Exam  Constitutional: She is oriented to person, place, and time and well-developed, well-nourished, and in no distress.  Able to get on exam table with  assistance. HENT:  Head: Normocephalic and atraumatic.  Mouth/Throat: No oropharyngeal exudate.  Eyes: Conjunctivae and EOM are normal. Pupils are equal, round, and reactive to light. No scleral icterus.  Neck: Normal range of motion. Neck supple. No JVD present. No thyromegaly present.  Cardiovascular: Normal rate and regular rhythm.  Exam reveals no friction rub.   No murmur heard. Pulmonary/Chest: Effort normal and breath sounds normal. No respiratory distress. She has no wheezes. She has no rales.  Abdominal: Soft. Bowel sounds are normal. She exhibits no distension. There is no rebound and no guarding. Slight abdominal tenderness upon palpation.  no palpable mass Musculoskeletal: Normal range of motion.  Lymphadenopathy:    She has no cervical adenopathy.  Neurological: She is alert and oriented to person, place, and time. No cranial nerve deficit. Coordination normal.  Skin: Skin is warm and dry.  Psychiatric: Mood, memory, affect and judgment normal. somewhat flat affect  LABORATORY DATA: I have reviewed the data as listed. CBC    Component Value Date/Time   WBC 4.8 01/24/2016 1135   RBC 4.89 01/24/2016 1135   RBC 4.29 07/27/2014 2257   HGB 11.8* 01/24/2016 1135   HCT 38.3 01/24/2016 1135   PLT 233 01/24/2016 1135   MCV 78.3 01/24/2016 1135   MCH 24.1* 01/24/2016 1135   MCHC 30.8 01/24/2016 1135   RDW 17.9* 01/24/2016 1135   LYMPHSABS 1.5 01/24/2016 1135   MONOABS 0.2 01/24/2016 1135   EOSABS 0.1 01/24/2016 1135   BASOSABS 0.0 01/24/2016 1135   CMP     Component Value Date/Time   NA 141 01/24/2016 1135   K 3.9 01/24/2016 1135   CL 106 01/24/2016 1135   CO2 28 01/24/2016 1135   GLUCOSE 111* 01/24/2016 1135   BUN 25* 01/24/2016 1135   CREATININE 1.10* 01/24/2016 1135   CALCIUM 8.5* 01/24/2016 1135   PROT 7.1 01/24/2016 1135   ALBUMIN 3.5 01/24/2016 1135   AST 13* 01/24/2016 1135   ALT 9* 01/24/2016 1135   ALKPHOS 57 01/24/2016 1135   BILITOT 0.3 01/24/2016  1135   GFRNONAA 46* 01/24/2016 1135   GFRAA 54* 01/24/2016 1135      RADIOLOGY: I have personally reviewed the radiological images as listed and agreed with the findings in the report. Study Result     CLINICAL DATA: Low back pain over the last year. Personal history of ovarian cancer.  EXAM: MRI LUMBAR SPINE WITHOUT CONTRAST  TECHNIQUE: Multiplanar, multisequence MR imaging of the lumbar spine was performed. No intravenous contrast was administered.  COMPARISON: MRI of the lumbar spine 06/20/2015. CT abdomen and pelvis 12/29/2015.  FINDINGS: Normal signal is present in the distal thoracic spinal cord and conus medullaris which terminates at L2, within normal limits. Marrow signal, vertebral body heights, and alignment are normal.  Limited imaging of the abdomen demonstrates a cystic lesion within the anatomic  pelvis measuring at least 4.7 cm. This is compatible with patient's known peritoneal carcinomatosis. Multiple cystic lesions were evident on the recent CT scan.  L1-2: Mild facet hypertrophy is present bilaterally without significant disc protrusion or stenosis.  L2-3: Negative.  L3-4: Mild facet hypertrophy and disc bulging is present without significant focal stenosis or change.  L4-5: A broad-based disc protrusion is asymmetric the left. Mild facet hypertrophy is noted bilaterally. This results in mild left foraminal narrowing.  L5-S1: A leftward disc protrusion is similar to the prior exam. There is slight progression of moderate left and mild right foraminal stenosis. Mild left subarticular narrowing is stable.  IMPRESSION: 1. Leftward disc protrusion at L5-S1 with slight progression of moderate left and mild right foraminal stenosis. 2. Left subarticular narrowing at L5-S1 is not significantly changed. 3. Mild left foraminal narrowing at L4-5 is stable. 4. Mild facet hypertrophy at L1-2 and L3-4 without significant stenosis at these  levels. 5. A cystic lesion within the anatomic pelvis is partially imaged. This corresponds to the areas of peritoneal carcinomatosis which were demonstrated on the recent CT scan.   Electronically Signed  By: San Morelle M.D.  On: 02/01/2016 06:49    Study Result     CLINICAL DATA: 81 year old female with history of primary peritoneal carcinomatosis (adenocarcinoma) status post chemotherapy (last treatment 2 weeks ago). Shortness of breath and cough for the past year. Nausea for the past year.  EXAM: CT CHEST, ABDOMEN, AND PELVIS WITH CONTRAST  TECHNIQUE: Multidetector CT imaging of the chest, abdomen and pelvis was performed following the standard protocol during bolus administration of intravenous contrast.  CONTRAST: 158m OMNIPAQUE IOHEXOL 300 MG/ML SOLN  COMPARISON: CT the abdomen and pelvis 08/17/2015. CT of the chest, abdomen and pelvis 07/29/2014. Multiple other prior examinations.  FINDINGS: CT CHEST FINDINGS  Mediastinum/Lymph Nodes: Heart size is normal. There is no significant pericardial fluid, thickening or pericardial calcification. There is atherosclerosis of the thoracic aorta, the great vessels of the mediastinum and the coronary arteries, including calcified atherosclerotic plaque in the left main, left anterior descending, left circumflex and right coronary arteries. No pathologically enlarged mediastinal or hilar lymph nodes. Large hiatal hernia. Right internal jugular single-lumen porta cath with tip terminating in the distal superior vena cava. No axillary lymphadenopathy.  Lungs/Pleura: Tiny 2 mm subpleural nodule in the periphery of the right middle lobe (image 34 of series 7), unchanged compared to prior study 07/29/2014, considered benign, presumably a subpleural lymph node. No other larger more suspicious appearing pulmonary nodules or masses are identified at this time. Areas of linear scarring are noted  throughout the lung bases bilaterally. No acute consolidative airspace disease. No pleural effusions.  Musculoskeletal/Soft Tissues: There are no aggressive appearing lytic or blastic lesions noted in the visualized portions of the skeleton.  CT ABDOMEN AND PELVIS FINDINGS  Hepatobiliary: No suspicious appearing cystic or solid hepatic lesions. Status post cholecystectomy. Mild intrahepatic biliary ductal dilatation is unchanged, as is mild extrahepatic biliary ductal dilatation (common bile duct measures up to 12 mm in the porta hepatis), presumably reflective of benign post cholecystectomy physiology.  Pancreas: No pancreatic mass. No pancreatic ductal dilatation. No pancreatic or peripancreatic fluid or inflammatory changes.  Spleen: Unremarkable.  Adrenals/Urinary Tract: Bilateral adrenal glands are normal in appearance. Multifocal cortical thinning throughout the kidneys bilaterally, in addition to mild bilateral renal atrophy. No suspicious renal lesions. No hydroureteronephrosis. Urinary bladder is partially decompressed, but unremarkable in appearance.  Stomach/Bowel: Intraabdominal portion of the stomach is normal. No pathologic  dilatation of small bowel or colon. Extensive colonic diverticulosis is noted, most severe throughout the descending and sigmoid colon, without surrounding inflammatory changes to suggest an acute diverticulitis at this time.  Vascular/Lymphatic: Atherosclerotic calcifications throughout the abdominal and pelvic vasculature, without evidence of aneurysm or dissection. No lymphadenopathy noted in the abdomen or pelvis.  Reproductive: Status post total abdominal hysterectomy. Ovaries are not confidently identified and may be surgically absent or atrophic.  Other: Compared to the prior examination from 08/17/2015 the abnormal fluid collections and soft tissue masses in the low anatomic pelvis have significantly increased in size. The  largest of these lesions now include a 6.0 x 4.8 cm heterogeneously enhancing multi-cystic lesion along the right pelvic sidewall (image 102 of series 2), and a similar appearing lesion measuring 4.2 x 7.1 cm immediately inferior to the proximal sigmoid colon (image 106 of series 2), which is inseparable from the colon on coronal and sagittal reformatted images. Several other smaller collections are also noted in the pelvis, all of which are larger than the prior examination. Some slight haziness is also noted in the omentum, best appreciated to the left of midline on images 71 and 73 of series 2, which could represent early omental involvement. No significant volume of free flowing ascites. No pneumoperitoneum.  Musculoskeletal: There are no aggressive appearing lytic or blastic lesions noted in the visualized portions of the skeleton.  IMPRESSION: 1. Today's study demonstrates progression of peritoneal carcinomatosis, with interval increase in number and size of numerous large lesions predominantly in the low anatomic pelvis, as detailed above. 2. No evidence of thoracic metastatic disease. 3. Atherosclerosis, including left main and 3 vessel coronary artery disease. Assessment for potential risk factor modification, dietary therapy or pharmacologic therapy may be warranted, if clinically indicated. 4. Large hiatal hernia. 5. Additional incidental findings, similar prior studies, as above.   Electronically Signed  By: Vinnie Langton M.D.  On: 12/29/2015 16:04    ASSESSMENT and THERAPY PLAN:  Stage IV primary peritoneal carcinoma BRCA 2 positivity Chronic L side, Left lower back pain Recurrent peritoneal carcinoma   I have increased her Paxil dose to 20 mg from previous 10 mg and have written her a new prescription.   I encouraged her to walk regularly. We can consider referral to cancer rehab it necessary.  She will return for treatment next week with repeat  PE and CA-125.  She is advised to call if her cough worsens. Family notes this is improving however.  All questions were answered. The patient knows to call the clinic with any problems, questions or concerns. We can certainly see the patient much sooner if necessary.   This note was signed electronically.  This document serves as a record of services personally performed by Ancil Linsey, MD. It was created on her behalf by Kandace Blitz, a trained medical scribe. The creation of this record is based on the scribe's personal observations and the provider's statements to them. This document has been checked and approved by the attending provider.  I have reviewed the above documentation for accuracy and completeness and I agree with the above.  Kelby Fam. Whitney Muse, MD

## 2016-02-01 NOTE — Patient Instructions (Addendum)
[place] at [facility] Discharge Instructions  RECOMMENDATIONS MADE BY THE CONSULTANT AND ANY TEST RESULTS WILL BE SENT TO YOUR REFERRING PHYSICIAN.   Exam and discussion by Dr Whitney Muse today  Try doing some type of light exercise everyday, walking is great.  We are going to increase paxil to 20 mg (I sent this to [place]), you can take 2 of your 10mg  until you run out of that prescription.  Return as scheduled for lab work next week (3/8), then the following day return for chemotherapy (3/9)  Return to see the doctor in 2 weeks with day 8 of your cycle (3/16) Please call the clinic if you have any questions or concerns     Thank you for choosing [place] at [facility] to provide your oncology and hematology care.  To afford each patient quality time with our provider, please arrive at least 15 minutes before your scheduled appointment time.   Beginning January 23rd 2017 lab work for the [facility] will be done in the  Main lab at [facility] on 1st floor. If you have a lab appointment with the [facility] please come in thru the  Main Entrance and check in at the main information desk  You need to re-schedule your appointment should you arrive 10 or more minutes late.  We strive to give you quality time with our providers, and arriving late affects you and other patients whose appointments are after yours.  Also, if you no show three or more times for appointments you may be dismissed from the clinic at the providers discretion.     Again, thank you for choosing [facility].  Our hope is that these requests will decrease the amount of time that you wait before being seen by our physicians.       _____________________________________________________________  Should you have questions after your visit to [facility], please contact our office at [phone] between the hours of 8:30 a.m. and  4:30 p.m.  Voicemails left after 4:30 p.m. will not be returned until the following business day.  For prescription refill requests, have your pharmacy contact our office.         Resources For Cancer Patients and their Caregivers ? American Cancer Society: Can assist with transportation, wigs, general needs, runs Look Good Feel Better.        607-196-9157 ? Cancer Care: Provides financial assistance, online support groups, medication/co-pay assistance.  1-800-813-HOPE (614)039-8964) ? [place] Assists [place] cancer patients and their families through emotional , educational and financial support.  540 631 1862 ? [place] DSS Where to apply for food stamps, Medicaid and utility assistance. 831-270-7594 ? RCATS: Transportation to medical appointments. (209)432-7405 ? Social Security Administration: May apply for disability if have a Stage IV cancer. 878-450-0755 639-201-6727 ? [employer], Disability and Transit Services: Assists with nutrition, care and transit needs. [phone]

## 2016-02-05 ENCOUNTER — Other Ambulatory Visit (HOSPITAL_COMMUNITY): Payer: Self-pay | Admitting: Hematology & Oncology

## 2016-02-07 ENCOUNTER — Encounter (HOSPITAL_COMMUNITY): Payer: Medicare HMO

## 2016-02-07 DIAGNOSIS — C482 Malignant neoplasm of peritoneum, unspecified: Secondary | ICD-10-CM

## 2016-02-07 DIAGNOSIS — C569 Malignant neoplasm of unspecified ovary: Secondary | ICD-10-CM | POA: Diagnosis present

## 2016-02-07 DIAGNOSIS — N189 Chronic kidney disease, unspecified: Secondary | ICD-10-CM | POA: Diagnosis present

## 2016-02-07 DIAGNOSIS — D631 Anemia in chronic kidney disease: Secondary | ICD-10-CM | POA: Diagnosis present

## 2016-02-07 LAB — COMPREHENSIVE METABOLIC PANEL
ALK PHOS: 56 U/L (ref 38–126)
ALT: 14 U/L (ref 14–54)
AST: 21 U/L (ref 15–41)
Albumin: 3.6 g/dL (ref 3.5–5.0)
Anion gap: 11 (ref 5–15)
BILIRUBIN TOTAL: 0.9 mg/dL (ref 0.3–1.2)
BUN: 24 mg/dL — AB (ref 6–20)
CALCIUM: 8.4 mg/dL — AB (ref 8.9–10.3)
CHLORIDE: 103 mmol/L (ref 101–111)
CO2: 26 mmol/L (ref 22–32)
CREATININE: 1.11 mg/dL — AB (ref 0.44–1.00)
GFR, EST AFRICAN AMERICAN: 53 mL/min — AB (ref >60)
GFR, EST NON AFRICAN AMERICAN: 46 mL/min — AB (ref >60)
Glucose, Bld: 95 mg/dL (ref 65–99)
Potassium: 4.9 mmol/L (ref 3.5–5.1)
Sodium: 140 mmol/L (ref 135–145)
TOTAL PROTEIN: 6.9 g/dL (ref 6.5–8.1)

## 2016-02-08 ENCOUNTER — Encounter (HOSPITAL_COMMUNITY): Payer: Self-pay

## 2016-02-08 ENCOUNTER — Encounter (HOSPITAL_BASED_OUTPATIENT_CLINIC_OR_DEPARTMENT_OTHER): Payer: Medicare HMO

## 2016-02-08 VITALS — BP 134/74 | HR 73 | Temp 98.0°F | Resp 18 | Wt 177.0 lb

## 2016-02-08 DIAGNOSIS — Z5111 Encounter for antineoplastic chemotherapy: Secondary | ICD-10-CM

## 2016-02-08 DIAGNOSIS — C482 Malignant neoplasm of peritoneum, unspecified: Secondary | ICD-10-CM

## 2016-02-08 DIAGNOSIS — Z5112 Encounter for antineoplastic immunotherapy: Secondary | ICD-10-CM

## 2016-02-08 LAB — CBC WITH DIFFERENTIAL/PLATELET
BASOS ABS: 0.1 10*3/uL (ref 0.0–0.1)
BASOS PCT: 1 %
EOS ABS: 0.1 10*3/uL (ref 0.0–0.7)
Eosinophils Relative: 2 %
HCT: 35.2 % — ABNORMAL LOW (ref 36.0–46.0)
HEMOGLOBIN: 10.8 g/dL — AB (ref 12.0–15.0)
Lymphocytes Relative: 31 %
Lymphs Abs: 2.1 10*3/uL (ref 0.7–4.0)
MCH: 24.5 pg — ABNORMAL LOW (ref 26.0–34.0)
MCHC: 30.7 g/dL (ref 30.0–36.0)
MCV: 80 fL (ref 78.0–100.0)
Monocytes Absolute: 0.7 10*3/uL (ref 0.1–1.0)
Monocytes Relative: 10 %
NEUTROS ABS: 3.7 10*3/uL (ref 1.7–7.7)
NEUTROS PCT: 56 %
Platelets: 156 10*3/uL (ref 150–400)
RBC: 4.4 MIL/uL (ref 3.87–5.11)
RDW: 20.9 % — ABNORMAL HIGH (ref 11.5–15.5)
WBC: 6.6 10*3/uL (ref 4.0–10.5)

## 2016-02-08 LAB — URINALYSIS, DIPSTICK ONLY
BILIRUBIN URINE: NEGATIVE
Glucose, UA: NEGATIVE mg/dL
Ketones, ur: NEGATIVE mg/dL
NITRITE: NEGATIVE
Protein, ur: NEGATIVE mg/dL
Specific Gravity, Urine: 1.01 (ref 1.005–1.030)
pH: 5 (ref 5.0–8.0)

## 2016-02-08 MED ORDER — HEPARIN SOD (PORK) LOCK FLUSH 100 UNIT/ML IV SOLN
500.0000 [IU] | Freq: Once | INTRAVENOUS | Status: AC | PRN
  Administered 2016-02-08: 500 [IU]
  Filled 2016-02-08: qty 5

## 2016-02-08 MED ORDER — FOSAPREPITANT DIMEGLUMINE INJECTION 150 MG
Freq: Once | INTRAVENOUS | Status: AC
  Administered 2016-02-08: 13:00:00 via INTRAVENOUS
  Filled 2016-02-08: qty 5

## 2016-02-08 MED ORDER — PALONOSETRON HCL INJECTION 0.25 MG/5ML
INTRAVENOUS | Status: AC
  Filled 2016-02-08: qty 5

## 2016-02-08 MED ORDER — SODIUM CHLORIDE 0.9 % IV SOLN
157.6000 mg | Freq: Once | INTRAVENOUS | Status: AC
  Administered 2016-02-08: 160 mg via INTRAVENOUS
  Filled 2016-02-08: qty 16

## 2016-02-08 MED ORDER — SODIUM CHLORIDE 0.9 % IV SOLN
15.0000 mg/kg | Freq: Once | INTRAVENOUS | Status: AC
  Administered 2016-02-08: 1250 mg via INTRAVENOUS
  Filled 2016-02-08: qty 40

## 2016-02-08 MED ORDER — SODIUM CHLORIDE 0.9 % IV SOLN
Freq: Once | INTRAVENOUS | Status: AC
  Administered 2016-02-08: 12:00:00 via INTRAVENOUS

## 2016-02-08 MED ORDER — PALONOSETRON HCL INJECTION 0.25 MG/5ML
0.2500 mg | Freq: Once | INTRAVENOUS | Status: AC
  Administered 2016-02-08: 0.25 mg via INTRAVENOUS

## 2016-02-08 MED ORDER — SODIUM CHLORIDE 0.9% FLUSH: 10.0000 mL | INTRAVENOUS | Status: DC | PRN

## 2016-02-08 MED ORDER — PACLITAXEL PROTEIN-BOUND CHEMO INJECTION 100 MG
80.0000 mg/m2 | Freq: Once | INTRAVENOUS | Status: AC
  Administered 2016-02-08: 150 mg via INTRAVENOUS
  Filled 2016-02-08: qty 30

## 2016-02-08 NOTE — Progress Notes (Signed)
1500:  Tolerated tx w/o adverse reaction.  VSS.  Discharged via wheelchair in c/o family for transport home.

## 2016-02-08 NOTE — Patient Instructions (Signed)
[facility] Discharge Instructions for Patients Receiving Chemotherapy   Beginning January 23rd 2017 lab work for the [facility] will be done in the  Main lab at [facility] on 1st floor. If you have a lab appointment with the [facility] please come in thru the  Main Entrance and check in at the main information desk   Today you received the following chemotherapy agents:  Avastin, Abraxane, and carboplatin.  If you develop nausea and vomiting, or diarrhea that is not controlled by your medication, call the clinic.  The clinic phone number is [phone]. Office hours are Monday-Friday 8:30am-5:00pm.  BELOW ARE SYMPTOMS THAT SHOULD BE REPORTED IMMEDIATELY:  *FEVER GREATER THAN 101.0 F  *CHILLS WITH OR WITHOUT FEVER  NAUSEA AND VOMITING THAT IS NOT CONTROLLED WITH YOUR NAUSEA MEDICATION  *UNUSUAL SHORTNESS OF BREATH  *UNUSUAL BRUISING OR BLEEDING  TENDERNESS IN MOUTH AND THROAT WITH OR WITHOUT PRESENCE OF ULCERS  *URINARY PROBLEMS  *BOWEL PROBLEMS  UNUSUAL RASH Items with * indicate a potential emergency and should be followed up as soon as possible. If you have an emergency after office hours please contact your primary care physician or go to the nearest emergency department.  Please call the clinic during office hours if you have any questions or concerns.   You may also contact the Patient Navigator at [phone] should you have any questions or need assistance in obtaining follow up care.      Resources For Cancer Patients and their Caregivers ? American Cancer Society: Can assist with transportation, wigs, general needs, runs Look Good Feel Better.        ([phone] ? Cancer Care: Provides financial assistance, online support groups, medication/co-pay assistance.  1-800-813-HOPE 5068304940) ? [place] Assists [place] cancer patients and their families through emotional , educational and financial  support.  (931)584-3245 ? [place] DSS Where to apply for food stamps, Medicaid and utility assistance. (989)330-0463 ? RCATS: Transportation to medical appointments. (337) 166-0329 ? Social Security Administration: May apply for disability if have a Stage IV cancer. 773-092-4067 [phone] ? [employer], Disability and Transit Services: Assists with nutrition, care and transit needs. 9167185211

## 2016-02-09 ENCOUNTER — Other Ambulatory Visit (HOSPITAL_COMMUNITY): Payer: Self-pay | Admitting: Oncology

## 2016-02-09 DIAGNOSIS — N390 Urinary tract infection, site not specified: Secondary | ICD-10-CM

## 2016-02-09 MED ORDER — CIPROFLOXACIN HCL 250 MG PO TABS: 250.0000 mg | ORAL_TABLET | Freq: Two times a day (BID) | ORAL | Status: DC

## 2016-02-14 ENCOUNTER — Encounter (HOSPITAL_COMMUNITY): Payer: Medicare HMO

## 2016-02-14 DIAGNOSIS — C482 Malignant neoplasm of peritoneum, unspecified: Secondary | ICD-10-CM

## 2016-02-14 LAB — CBC WITH DIFFERENTIAL/PLATELET
Basophils Absolute: 0.1 10*3/uL (ref 0.0–0.1)
Basophils Relative: 1 %
EOS ABS: 0.2 10*3/uL (ref 0.0–0.7)
Eosinophils Relative: 2 %
HEMATOCRIT: 36.6 % (ref 36.0–46.0)
HEMOGLOBIN: 11.1 g/dL — AB (ref 12.0–15.0)
LYMPHS ABS: 1.6 10*3/uL (ref 0.7–4.0)
Lymphocytes Relative: 20 %
MCH: 24.8 pg — AB (ref 26.0–34.0)
MCHC: 30.3 g/dL (ref 30.0–36.0)
MCV: 81.7 fL (ref 78.0–100.0)
MONOS PCT: 4 %
Monocytes Absolute: 0.3 10*3/uL (ref 0.1–1.0)
NEUTROS PCT: 74 %
Neutro Abs: 6 10*3/uL (ref 1.7–7.7)
Platelets: 154 10*3/uL (ref 150–400)
RBC: 4.48 MIL/uL (ref 3.87–5.11)
RDW: 21.1 % — ABNORMAL HIGH (ref 11.5–15.5)
WBC: 8 10*3/uL (ref 4.0–10.5)

## 2016-02-14 LAB — COMPREHENSIVE METABOLIC PANEL
ALT: 14 U/L (ref 14–54)
ANION GAP: 5 (ref 5–15)
AST: 17 U/L (ref 15–41)
Albumin: 3.4 g/dL — ABNORMAL LOW (ref 3.5–5.0)
Alkaline Phosphatase: 53 U/L (ref 38–126)
BILIRUBIN TOTAL: 0.4 mg/dL (ref 0.3–1.2)
BUN: 21 mg/dL — AB (ref 6–20)
CHLORIDE: 100 mmol/L — AB (ref 101–111)
CO2: 31 mmol/L (ref 22–32)
Calcium: 8.1 mg/dL — ABNORMAL LOW (ref 8.9–10.3)
Creatinine, Ser: 1.37 mg/dL — ABNORMAL HIGH (ref 0.44–1.00)
GFR calc Af Amer: 41 mL/min — ABNORMAL LOW (ref >60)
GFR, EST NON AFRICAN AMERICAN: 36 mL/min — AB (ref >60)
Glucose, Bld: 108 mg/dL — ABNORMAL HIGH (ref 65–99)
POTASSIUM: 4.2 mmol/L (ref 3.5–5.1)
Sodium: 136 mmol/L (ref 135–145)
TOTAL PROTEIN: 6.8 g/dL (ref 6.5–8.1)

## 2016-02-15 ENCOUNTER — Encounter (HOSPITAL_COMMUNITY): Payer: Self-pay | Admitting: Oncology

## 2016-02-15 ENCOUNTER — Encounter (HOSPITAL_BASED_OUTPATIENT_CLINIC_OR_DEPARTMENT_OTHER): Payer: Medicare HMO

## 2016-02-15 ENCOUNTER — Encounter (HOSPITAL_BASED_OUTPATIENT_CLINIC_OR_DEPARTMENT_OTHER): Payer: Medicare HMO | Admitting: Oncology

## 2016-02-15 ENCOUNTER — Ambulatory Visit (HOSPITAL_COMMUNITY): Payer: Medicare HMO | Admitting: Oncology

## 2016-02-15 ENCOUNTER — Other Ambulatory Visit (HOSPITAL_COMMUNITY): Payer: Self-pay | Admitting: Lab

## 2016-02-15 VITALS — BP 122/68 | HR 73 | Temp 97.9°F | Resp 18

## 2016-02-15 VITALS — BP 133/63 | HR 80 | Temp 97.7°F | Resp 20 | Wt 177.0 lb

## 2016-02-15 DIAGNOSIS — C482 Malignant neoplasm of peritoneum, unspecified: Secondary | ICD-10-CM

## 2016-02-15 DIAGNOSIS — Z5111 Encounter for antineoplastic chemotherapy: Secondary | ICD-10-CM | POA: Diagnosis not present

## 2016-02-15 DIAGNOSIS — J910 Malignant pleural effusion: Secondary | ICD-10-CM

## 2016-02-15 LAB — URINALYSIS, ROUTINE W REFLEX MICROSCOPIC
Bilirubin Urine: NEGATIVE
GLUCOSE, UA: NEGATIVE mg/dL
KETONES UR: NEGATIVE mg/dL
Nitrite: NEGATIVE
PROTEIN: NEGATIVE mg/dL
Specific Gravity, Urine: 1.02 (ref 1.005–1.030)
pH: 5 (ref 5.0–8.0)

## 2016-02-15 LAB — URINE MICROSCOPIC-ADD ON

## 2016-02-15 MED ORDER — PALONOSETRON HCL INJECTION 0.25 MG/5ML
0.2500 mg | Freq: Once | INTRAVENOUS | Status: AC
  Administered 2016-02-15: 0.25 mg via INTRAVENOUS
  Filled 2016-02-15: qty 5

## 2016-02-15 MED ORDER — SODIUM CHLORIDE 0.9% FLUSH
10.0000 mL | INTRAVENOUS | Status: DC | PRN
  Administered 2016-02-15: 10 mL
  Filled 2016-02-15: qty 10

## 2016-02-15 MED ORDER — PACLITAXEL PROTEIN-BOUND CHEMO INJECTION 100 MG
80.0000 mg/m2 | Freq: Once | INTRAVENOUS | Status: AC
  Administered 2016-02-15: 150 mg via INTRAVENOUS
  Filled 2016-02-15: qty 30

## 2016-02-15 MED ORDER — SODIUM CHLORIDE 0.9 % IV SOLN
Freq: Once | INTRAVENOUS | Status: AC
  Administered 2016-02-15: 13:00:00 via INTRAVENOUS
  Filled 2016-02-15: qty 5

## 2016-02-15 MED ORDER — HEPARIN SOD (PORK) LOCK FLUSH 100 UNIT/ML IV SOLN
500.0000 [IU] | Freq: Once | INTRAVENOUS | Status: AC | PRN
  Administered 2016-02-15: 500 [IU]
  Filled 2016-02-15: qty 5

## 2016-02-15 MED ORDER — SODIUM CHLORIDE 0.9 % IV SOLN
Freq: Once | INTRAVENOUS | Status: AC
  Administered 2016-02-15: 10:00:00 via INTRAVENOUS

## 2016-02-15 MED ORDER — SODIUM CHLORIDE 0.9 % IV SOLN
137.2000 mg | Freq: Once | INTRAVENOUS | Status: AC
  Administered 2016-02-15: 140 mg via INTRAVENOUS
  Filled 2016-02-15: qty 14

## 2016-02-15 NOTE — Patient Instructions (Addendum)
[place] at [facility] Discharge Instructions  RECOMMENDATIONS MADE BY THE CONSULTANT AND ANY TEST RESULTS WILL BE SENT TO YOUR REFERRING PHYSICIAN.  Exam done and seen by Kirby Crigler today Labs reviewed and are stable. Chemo today and we drew a CA 125 again today. Will call you with results. Getting a urine sample today.   Thank you for choosing [place] at [facility] to provide your oncology and hematology care.  To afford each patient quality time with our provider, please arrive at least 15 minutes before your scheduled appointment time.   Beginning January 23rd 2017 lab work for the [facility] will be done in the  Main lab at [facility] on 1st floor. If you have a lab appointment with the [facility] please come in thru the  Main Entrance and check in at the main information desk  You need to re-schedule your appointment should you arrive 10 or more minutes late.  We strive to give you quality time with our providers, and arriving late affects you and other patients whose appointments are after yours.  Also, if you no show three or more times for appointments you may be dismissed from the clinic at the providers discretion.     Again, thank you for choosing [facility].  Our hope is that these requests will decrease the amount of time that you wait before being seen by our physicians.       _____________________________________________________________  Should you have questions after your visit to [facility], please contact our office at [phone] between the hours of 8:30 a.m. and 4:30 p.m.  Voicemails left after 4:30 p.m. will not be returned until the following business day.  For prescription refill requests, have your pharmacy contact our office.         Resources For Cancer Patients and their Caregivers ? American Cancer Society: Can assist with transportation, wigs, general needs, runs  Look Good Feel Better.        2195747615 ? Cancer Care: Provides financial assistance, online support groups, medication/co-pay assistance.  1-800-813-HOPE 4010938213) ? [place] Assists [place] cancer patients and their families through emotional , educational and financial support.  (313)522-2910 ? [place] DSS Where to apply for food stamps, Medicaid and utility assistance. 7723780388 ? RCATS: Transportation to medical appointments. 727-031-0271 ? Social Security Administration: May apply for disability if have a Stage IV cancer. [phone] ? [employer], Disability and Transit Services: Assists with nutrition, care and transit needs. [phone]

## 2016-02-15 NOTE — Progress Notes (Signed)
Sarah Bogus, MD [name] [name] [address]  Primary peritoneal carcinomatosis [facility]) - Plan: Urinalysis, Routine w reflex microscopic, Urine culture  CURRENT THERAPY: Carboplatin/Abraxane days 1, 8, 15 every 21 days beginning on 01/11/2016  INTERVAL HISTORY: [name] 81 y.o. female returns for followup of recurrent peritoneal carcinomatosis, STAGE IV, BRCA 2 POSITIVE.    Primary peritoneal carcinomatosis ([place])   07/25/2014 Pathology Results Left pleural effusion positive for malignant cell consistent with adenocarcinoma.   07/27/2014 - 08/05/2014 Hospital Admission Heart failure with malignant pleural effusion   07/28/2014 Pathology Results Left pleural effusion positive for malignant cells consistent with adenocarcinoma (CK7 pos, CK20 neg, WT-1 pos, ER weakly pos, PR neg, TTF-1 neg, GCDFP-15 neg).  Findings favor gyn/ovarian primary.   08/01/2014 Pathology Results [facility]- Postive genomic findings: BRCA2 [id number]*31, STK11 loss, MYC amplification-equivocal, TP53 R273C, MLL2 QV8938.  Targeted treatments include Olaparib (BRCA2) and Everolimus/Temsirolimus (STK11).   08/03/2014 Tumor Marker CA 125 4954   08/04/2014 Procedure Left pleurX catheter by Dr. VLucianne LeiTright   09/08/2014 - 09/14/2014 Hospital Admission Hospitalized with the following main issues: acute on chronic CHF, acute on chronic respiratory failure with hypoxia, and malignant plueral effusion on right    09/13/2014 Procedure Right PleurX catheter by Dr. BCyndia Bent  09/13/2014 Pathology Results Right pleural effusion positive for malignant adenocarcinoma   09/21/2014 Tumor Marker CA 125 3869   09/22/2014 - 11/17/2014 Chemotherapy Carboplatin/Paclitaxel days 1, 8, 15 every 28 days.   10/20/2014 Tumor Marker CA 125 335   11/23/2014 - 02/23/2015 Chemotherapy Carboplatin/Abraxane day 1, 8, 15 every 28 days.   06/06/2015 Tumor Marker CA 125: 21.7    08/04/2015 Tumor Marker CA 125: 23.6    08/17/2015  Imaging CT abd/pelvis- Resolution of ascites and pleural effusions since previous study. Near complete resolution of soft tissue stranding and nodularity in the omental and mesenteric fat.  Resolution of left inguinal lymphadenopathy since prior exam.   10/10/2015 Tumor Marker CA 125: 47.4 (H)    11/30/2015 Tumor Marker CA 125: 101.2 (H)   12/25/2015 Tumor Marker CA 125: 118.4 (H)    12/29/2015 Progression    12/29/2015 Imaging CT CAP- progression of peritoneal carcinomatosis, with interval increase in number and size of numerous large lesions predominantly in the low anatomic pelvis   01/11/2016 -  Chemotherapy Carboplatin/Abraxane days 1, 8, 15 every 21 days. Avastin   02/01/2016 Imaging MRI L-spine- Leftward disc protrusion at L5-S1 with slight progression of moderate left and mild right foraminal stenosis. Left subarticular narrowing at L5-S1 is not significantly changed. Mild left foraminal narrowing at L4-5 is stable.    I personally reviewed and went over laboratory results with the patient.  The results are noted within this dictation. Labs are updated today in preparation for her treatment.  I personally reviewed and went over radiographic studies with the patient.  The results are noted within this dictation.  MRI of L-spine for back pain demonstrates leftward disc protrusion at L5-S1 with slight progression of moderate left and mild right foraminal stenosis.  Other findings are stable without significant change.  She is seen with an aid who helps her at home ambulating from the bathroom.  She has a slow gait.    She notes intermittent urinary pain with urination, but this is improved.  I will recheck a UA today with culture and sensitivity.    She denies any new pain or worse pain.  She is using an  OTC lidocaine patch.  She denies any bowel changes.  Her fatigue is at baseline.  She notes that her appetite is strong.  Her weight is up to 177 lbs.    Her significant other is concerned about her  memory, as am I.    Past Medical History  Diagnosis Date  . GERD (gastroesophageal reflux disease)   . Osteoarthritis   . Peripheral edema   . CHF (congestive heart failure), NYHA class IV (HCC)     diastolic  . UTI (lower urinary tract infection)   . Cancer ([place])   . Ovarian cancer ([place])   . DNR (do not resuscitate) 01/24/2016    09/14/2014    has GERD; CONSTIPATION, CHRONIC; HEMATOCHEZIA; OSTEOARTHRITIS, LOWER LEG; KNEE PAIN; BURSITIS, KNEE; Dysphagia; Malignant pleural effusion; Edema of both legs; Heart failure, diastolic, with acute decompensation ([place]); Hyperkalemia; Anemia, unspecified; Rectal bleed; Chest pain; Protein-calorie malnutrition, severe ([place]); Dysphagia, pharyngoesophageal phase; Constipation; Acute on chronic congestive heart failure ([place]); Acute on chronic respiratory failure with hypoxia ([place]); Primary peritoneal carcinomatosis ([place]); Anemia due to chemotherapy; Cellulitis; Genetic testing; and DNR (do not resuscitate) on her problem list.     is allergic to penicillins.  Current Outpatient Prescriptions on File Prior to Visit  Medication Sig Dispense Refill  . Bevacizumab (AVASTIN IV) Inject into the vein. To be given Day 1 every 21 days    . CARBOPLATIN IV Inject into the vein. Days 1, 8, 15 every 21 days    . ciprofloxacin (CIPRO) 250 MG tablet Take 1 tablet (250 mg total) by mouth 2 (two) times daily. 10 tablet 0  . furosemide (LASIX) 20 MG tablet Take 20 mg by mouth every morning.     . gabapentin (NEURONTIN) 300 MG capsule Take 2 capsules (600 mg total) by mouth at bedtime. 60 capsule 2  . KLOR-CON M10 10 MEQ tablet Take 20 mEq by mouth 3 (three) times daily.     Marland Kitchen lactulose (CHRONULAC) 10 GM/15ML solution Take 30 mLs (20 g total) by mouth 2 (two) times daily as needed for moderate constipation. 240 mL 0  . lidocaine-prilocaine (EMLA) cream Apply a quarter size amount to port site 1 hour prior to chemo. Do not rub in. Cover with plastic wrap. 30 g 3  . magic  mouthwash w/lidocaine SOLN Take 5 mLs by mouth 4 (four) times daily -  before meals and at bedtime. 360 mL 0  . metoCLOPramide (REGLAN) 5 MG tablet Take 5 mg by mouth 4 (four) times daily.    . mometasone (ELOCON) 0.1 % cream Apply 1 application topically daily. To affected areas (arms and hands)    . ondansetron (ZOFRAN ODT) 4 MG disintegrating tablet 73m ODT q4 hours prn nausea/vomit 12 tablet 0  . ondansetron (ZOFRAN) 8 MG tablet Take 1 tablet (8 mg total) by mouth every 8 (eight) hours as needed for nausea or vomiting. 30 tablet 2  . oxyCODONE (ROXICODONE) 5 MG immediate release tablet Take two tablet by mouth every 4 hours as needed for pain (Patient taking differently: Take 10 mg by mouth every 4 (four) hours as needed for moderate pain or severe pain. ) 90 tablet 0  . oxyCODONE-acetaminophen (PERCOCET/ROXICET) 5-325 MG tablet Take 1 tablet by mouth every 6 (six) hours as needed. 20 tablet 0  . PACLitaxel Protein-Bound Part (ABRAXANE IV) Inject into the vein. Days 1, 8, 15 every 21 days    . pantoprazole (PROTONIX) 40 MG tablet Take 1 tablet (40 mg total) by mouth daily.  30 tablet 0  . PARoxetine (PAXIL) 20 MG tablet Take 1 tablet (20 mg total) by mouth daily. 30 tablet 1  . predniSONE (DELTASONE) 10 MG tablet Take 1 tablet (10 mg total) by mouth daily with breakfast. 30 tablet 1  . prochlorperazine (COMPAZINE) 10 MG tablet Take 1 tablet (10 mg total) by mouth every 6 (six) hours as needed for nausea or vomiting. 30 tablet 2   Current Facility-Administered Medications on File Prior to Visit  Medication Dose Route Frequency Provider Last Rate Last Dose  . sodium chloride flush (NS) 0.9 % injection 10 mL  10 mL Intracatheter PRN Patrici Ranks, MD   10 mL at 02/15/16 1420    Past Surgical History  Procedure Laterality Date  . Cholecystectomy    . Cardiac catheterization    . Total abdominal hysterectomy  40 years ago    patient does not know if ovaries were removed  . Appendectomy      . Total knee arthroplasty Left   . Chest tube insertion Left 08/04/2014    Procedure: INSERTION PLEURAL DRAINAGE CATHETER;  Surgeon: Ivin Poot, MD;  Location: [place];  Service: Thoracic;  Laterality: Left;  . Chest tube insertion Right 09/13/2014    Procedure: INSERTION PLEURAL DRAINAGE CATHETER;  Surgeon: Gaye Pollack, MD;  Location: [facility];  Service: Thoracic;  Laterality: Right;  . Removal of pleural drainage catheter Bilateral 12/06/2014    Procedure: REMOVAL OF PLEURAL DRAINAGE CATHETER;  Surgeon: Ivin Poot, MD;  Location: [facility];  Service: Thoracic;  Laterality: Bilateral;    Denies any headaches, dizziness, double vision, fevers, chills, night sweats, nausea, vomiting, diarrhea, constipation, chest pain, heart palpitations, shortness of breath, blood in stool, black tarry stool, urinary pain, urinary burning, urinary frequency, hematuria.   PHYSICAL EXAMINATION  ECOG PERFORMANCE STATUS: 2 - Symptomatic, <50% confined to bed  Filed Vitals:   02/15/16 1000  BP: 133/63  Pulse: 80  Temp: 97.7 F (36.5 C)  Resp: 20     GENERAL:alert, well nourished, well developed, comfortable, cooperative, smiling and accompanied by her Significant other and aid.  She is in a chemotherapy recliner.  SKIN: skin color, texture, turgor are normal, no rashes or significant lesions HEAD: Normocephalic, No masses, lesions, tenderness or abnormalities EYES: normal, PERRLA, EOMI, Conjunctiva are pink and non-injected EARS: External ears normal OROPHARYNX:lips, buccal mucosa, and tongue normal and mucous membranes are moist  NECK: supple, trachea midline LYMPH:  not examined BREAST:not examined LUNGS: Clear to auscultation bilaterally without any wheezes, rales, or rhonchi. CARDIAC: regular rate and rhythm without murmur, rub, or gallop.  Normal S1 and S2. ABDOMEN:abdomen soft and normal bowel sounds, nontender. BACK: Back symmetric, no curvature. EXTREMITIES:less then 2 second capillary  refill, no joint deformities, effusion, or inflammation, no skin discoloration  NEURO: alert & oriented x 3 with fluent speech, no focal motor/sensory deficits   LABORATORY DATA: CBC    Component Value Date/Time   WBC 8.0 02/14/2016 1522   RBC 4.48 02/14/2016 1522   RBC 4.29 07/27/2014 2257   HGB 11.1* 02/14/2016 1522   HCT 36.6 02/14/2016 1522   PLT 154 02/14/2016 1522   MCV 81.7 02/14/2016 1522   MCH 24.8* 02/14/2016 1522   MCHC 30.3 02/14/2016 1522   RDW 21.1* 02/14/2016 1522   LYMPHSABS 1.6 02/14/2016 1522   MONOABS 0.3 02/14/2016 1522   EOSABS 0.2 02/14/2016 1522   BASOSABS 0.1 02/14/2016 1522      Chemistry  Component Value Date/Time   NA 136 02/14/2016 1522   K 4.2 02/14/2016 1522   CL 100* 02/14/2016 1522   CO2 31 02/14/2016 1522   BUN 21* 02/14/2016 1522   CREATININE 1.37* 02/14/2016 1522      Component Value Date/Time   CALCIUM 8.1* 02/14/2016 1522   ALKPHOS 53 02/14/2016 1522   AST 17 02/14/2016 1522   ALT 14 02/14/2016 1522   BILITOT 0.4 02/14/2016 1522      Lab Results  Component Value Date   CA125 118.4* 12/25/2015     PENDING LABS:   RADIOGRAPHIC STUDIES:  Mr Lumbar Spine Wo Contrast  02/01/2016  CLINICAL DATA:  Low back pain over the last year. Personal history of ovarian cancer. EXAM: MRI LUMBAR SPINE WITHOUT CONTRAST TECHNIQUE: Multiplanar, multisequence MR imaging of the lumbar spine was performed. No intravenous contrast was administered. COMPARISON:  MRI of the lumbar spine 06/20/2015. CT abdomen and pelvis 12/29/2015. FINDINGS: Normal signal is present in the distal thoracic spinal cord and conus medullaris which terminates at L2, within normal limits. Marrow signal, vertebral body heights, and alignment are normal. Limited imaging of the abdomen demonstrates a cystic lesion within the anatomic pelvis measuring at least 4.7 cm. This is compatible with patient's known peritoneal carcinomatosis. Multiple cystic lesions were evident on the  recent CT scan. L1-2: Mild facet hypertrophy is present bilaterally without significant disc protrusion or stenosis. L2-3:  Negative. L3-4: Mild facet hypertrophy and disc bulging is present without significant focal stenosis or change. L4-5: A broad-based disc protrusion is asymmetric the left. Mild facet hypertrophy is noted bilaterally. This results in mild left foraminal narrowing. L5-S1: A leftward disc protrusion is similar to the prior exam. There is slight progression of moderate left and mild right foraminal stenosis. Mild left subarticular narrowing is stable. IMPRESSION: 1. Leftward disc protrusion at L5-S1 with slight progression of moderate left and mild right foraminal stenosis. 2. Left subarticular narrowing at L5-S1 is not significantly changed. 3. Mild left foraminal narrowing at L4-5 is stable. 4. Mild facet hypertrophy at L1-2 and L3-4 without significant stenosis at these levels. 5. A cystic lesion within the anatomic pelvis is partially imaged. This corresponds to the areas of peritoneal carcinomatosis which were demonstrated on the recent CT scan. Electronically Signed   By: San Morelle M.D.   On: 02/01/2016 06:49     PATHOLOGY:    ASSESSMENT AND PLAN:  Primary peritoneal carcinomatosis ([place]) Recurrent, STAGE IV, BRCA 2 POSITIVE, peritoneal carcinomatosis after initially being treated with Carboplatin/Paclitaxel beginning on 09/38/1829 complicated by Cremophor reaction resulting in a change in therapy on 11/23/2014 to Carboplatin/Abraxane finishing on 02/23/2015.  Now with recurrent disease and increasing tumor marker.  She began Carboplatin/Abraxane therapy again in the recurrent setting on 01/11/2016 given that she was last treated with this regimen 11 months prior (thus a re-challenge is reasonable).  Oncology history is updated.  Her Paxil dose was recently increased to 20 mg.  She is to continue with Lidocaine patches as directed.  These are OTC products.  I will  order a UA with culture and sensitivity.  Return in 2 weeks for follow-up and treatment as planned with a return appointment next week for chemotherapy based upon satisfaction of treatment parameters.   THERAPY PLAN:  Continue with systemic chemotherapy consisting of Carboplatin/Abraxane.  All questions were answered. The patient knows to call the clinic with any problems, questions or concerns. We can certainly see the patient much sooner if necessary.  Patient and  plan discussed with Dr. Ancil Linsey and she is in agreement with the aforementioned.   This note is electronically signed by: Doy Mince 02/15/2016 5:11 PM

## 2016-02-15 NOTE — Assessment & Plan Note (Addendum)
Recurrent, STAGE IV, BRCA 2 POSITIVE, peritoneal carcinomatosis after initially being treated with Carboplatin/Paclitaxel beginning on 99/96/7227 complicated by Cremophor reaction resulting in a change in therapy on 11/23/2014 to Carboplatin/Abraxane finishing on 02/23/2015.  Now with recurrent disease and increasing tumor marker.  She began Carboplatin/Abraxane therapy again in the recurrent setting on 01/11/2016 given that she was last treated with this regimen 11 months prior (thus a re-challenge is reasonable).  Oncology history is updated.  Her Paxil dose was recently increased to 20 mg.  She is to continue with Lidocaine patches as directed.  These are OTC products.  I will order a UA with culture and sensitivity.  Return in 2 weeks for follow-up and treatment as planned with a return appointment next week for chemotherapy based upon satisfaction of treatment parameters.

## 2016-02-15 NOTE — Patient Instructions (Signed)
[facility] Discharge Instructions for Patients Receiving Chemotherapy   Beginning January 23rd 2017 lab work for the Cancer Center will be done in the  Main lab at  on 1st floor. If you have a lab appointment with the Cancer Center please come in thru the  Main Entrance and check in at the main information desk   Today you received the following chemotherapy agents:  Abraxane and Carboplatin  If you develop nausea and vomiting, or diarrhea that is not controlled by your medication, call the clinic.  The clinic phone number is [phone]. Office hours are Monday-Friday 8:30am-5:00pm.  BELOW ARE SYMPTOMS THAT SHOULD BE REPORTED IMMEDIATELY:  *FEVER GREATER THAN 101.0 F  *CHILLS WITH OR WITHOUT FEVER  NAUSEA AND VOMITING THAT IS NOT CONTROLLED WITH YOUR NAUSEA MEDICATION  *UNUSUAL SHORTNESS OF BREATH  *UNUSUAL BRUISING OR BLEEDING  TENDERNESS IN MOUTH AND THROAT WITH OR WITHOUT PRESENCE OF ULCERS  *URINARY PROBLEMS  *BOWEL PROBLEMS  UNUSUAL RASH Items with * indicate a potential emergency and should be followed up as soon as possible. If you have an emergency after office hours please contact your primary care physician or go to the nearest emergency department.  Please call the clinic during office hours if you have any questions or concerns.   You may also contact the Patient Navigator at [phone] should you have any questions or need assistance in obtaining follow up care.      Resources For Cancer Patients and their Caregivers ? American Cancer Society: Can assist with transportation, wigs, general needs, runs Look Good Feel Better.        1-888-227-6333 ? Cancer Care: Provides financial assistance, online support groups, medication/co-pay assistance.  1-800-813-HOPE (4673) ? [facility] Assists [place] cancer patients and their families through emotional , educational and financial support.   336-427-4357 ? [place] DSS Where to apply for food stamps, Medicaid and utility assistance. 336-342-1394 ? RCATS: Transportation to medical appointments. 336-347-2287 ? Social Security Administration: May apply for disability if have a Stage IV cancer. 336-342-7796 1-800-772-1213 ? [place] Aging, Disability and Transit Services: Assists with nutrition, care and transit needs. [phone]         

## 2016-02-15 NOTE — Progress Notes (Signed)
Patient tolerated infusion well.  VSS.   

## 2016-02-16 LAB — CA 125: CA 125: 194.1 U/mL — AB (ref 0.0–38.1)

## 2016-02-17 LAB — URINE CULTURE

## 2016-02-19 ENCOUNTER — Encounter (HOSPITAL_COMMUNITY): Payer: Medicare HMO

## 2016-02-21 ENCOUNTER — Encounter (HOSPITAL_COMMUNITY): Payer: Medicare HMO

## 2016-02-21 DIAGNOSIS — C482 Malignant neoplasm of peritoneum, unspecified: Secondary | ICD-10-CM | POA: Diagnosis not present

## 2016-02-21 LAB — COMPREHENSIVE METABOLIC PANEL
ALK PHOS: 54 U/L (ref 38–126)
ALT: 14 U/L (ref 14–54)
AST: 26 U/L (ref 15–41)
Albumin: 3.5 g/dL (ref 3.5–5.0)
Anion gap: 11 (ref 5–15)
BUN: 26 mg/dL — AB (ref 6–20)
CALCIUM: 8.4 mg/dL — AB (ref 8.9–10.3)
CHLORIDE: 101 mmol/L (ref 101–111)
CO2: 25 mmol/L (ref 22–32)
CREATININE: 1.13 mg/dL — AB (ref 0.44–1.00)
GFR calc Af Amer: 52 mL/min — ABNORMAL LOW (ref >60)
GFR, EST NON AFRICAN AMERICAN: 45 mL/min — AB (ref >60)
Glucose, Bld: 118 mg/dL — ABNORMAL HIGH (ref 65–99)
Potassium: 4.1 mmol/L (ref 3.5–5.1)
SODIUM: 137 mmol/L (ref 135–145)
Total Bilirubin: 0.7 mg/dL (ref 0.3–1.2)
Total Protein: 7 g/dL (ref 6.5–8.1)

## 2016-02-21 LAB — CBC WITH DIFFERENTIAL/PLATELET
BASOS ABS: 0 10*3/uL (ref 0.0–0.1)
Basophils Relative: 0 %
EOS PCT: 2 %
Eosinophils Absolute: 0.1 10*3/uL (ref 0.0–0.7)
HCT: 35.2 % — ABNORMAL LOW (ref 36.0–46.0)
HEMOGLOBIN: 11.1 g/dL — AB (ref 12.0–15.0)
LYMPHS ABS: 1.6 10*3/uL (ref 0.7–4.0)
LYMPHS PCT: 29 %
MCH: 25.6 pg — ABNORMAL LOW (ref 26.0–34.0)
MCHC: 31.5 g/dL (ref 30.0–36.0)
MCV: 81.1 fL (ref 78.0–100.0)
Monocytes Absolute: 0.2 10*3/uL (ref 0.1–1.0)
Monocytes Relative: 4 %
NEUTROS PCT: 65 %
Neutro Abs: 3.5 10*3/uL (ref 1.7–7.7)
PLATELETS: 210 10*3/uL (ref 150–400)
RBC: 4.34 MIL/uL (ref 3.87–5.11)
RDW: 22.7 % — ABNORMAL HIGH (ref 11.5–15.5)
WBC: 5.4 10*3/uL (ref 4.0–10.5)

## 2016-02-22 ENCOUNTER — Other Ambulatory Visit (HOSPITAL_COMMUNITY): Payer: Self-pay | Admitting: Oncology

## 2016-02-22 ENCOUNTER — Encounter (HOSPITAL_BASED_OUTPATIENT_CLINIC_OR_DEPARTMENT_OTHER): Payer: Medicare HMO

## 2016-02-22 ENCOUNTER — Encounter (HOSPITAL_COMMUNITY): Payer: Self-pay

## 2016-02-22 VITALS — BP 123/74 | HR 94 | Temp 97.8°F | Resp 18

## 2016-02-22 DIAGNOSIS — C482 Malignant neoplasm of peritoneum, unspecified: Secondary | ICD-10-CM | POA: Diagnosis not present

## 2016-02-22 DIAGNOSIS — Z5111 Encounter for antineoplastic chemotherapy: Secondary | ICD-10-CM

## 2016-02-22 MED ORDER — PACLITAXEL PROTEIN-BOUND CHEMO INJECTION 100 MG
80.0000 mg/m2 | Freq: Once | INTRAVENOUS | Status: AC
  Administered 2016-02-22: 150 mg via INTRAVENOUS
  Filled 2016-02-22: qty 30

## 2016-02-22 MED ORDER — PALONOSETRON HCL INJECTION 0.25 MG/5ML
0.2500 mg | Freq: Once | INTRAVENOUS | Status: AC
  Administered 2016-02-22: 0.25 mg via INTRAVENOUS
  Filled 2016-02-22: qty 5

## 2016-02-22 MED ORDER — SODIUM CHLORIDE 0.9 % IV SOLN
Freq: Once | INTRAVENOUS | Status: AC
  Administered 2016-02-22: 12:00:00 via INTRAVENOUS

## 2016-02-22 MED ORDER — SODIUM CHLORIDE 0.9 % IV SOLN
155.8000 mg | Freq: Once | INTRAVENOUS | Status: AC
  Administered 2016-02-22: 160 mg via INTRAVENOUS
  Filled 2016-02-22: qty 16

## 2016-02-22 MED ORDER — SODIUM CHLORIDE 0.9 % IV SOLN
Freq: Once | INTRAVENOUS | Status: AC
  Administered 2016-02-22: 12:00:00 via INTRAVENOUS
  Filled 2016-02-22: qty 5

## 2016-02-22 MED ORDER — SODIUM CHLORIDE 0.9% FLUSH
10.0000 mL | INTRAVENOUS | Status: DC | PRN
  Administered 2016-02-22: 10 mL
  Filled 2016-02-22: qty 10

## 2016-02-22 MED ORDER — HEPARIN SOD (PORK) LOCK FLUSH 100 UNIT/ML IV SOLN
500.0000 [IU] | Freq: Once | INTRAVENOUS | Status: AC | PRN
  Administered 2016-02-22: 500 [IU]
  Filled 2016-02-22: qty 5

## 2016-02-22 NOTE — Progress Notes (Signed)
Tolerated chemo without problems today

## 2016-02-22 NOTE — Patient Instructions (Signed)
[facility] Discharge Instructions for Patients Receiving Chemotherapy   Beginning January 23rd 2017 lab work for the [facility] will be done in the  Main lab at [facility] on 1st floor. If you have a lab appointment with the [facility] please come in thru the  Main Entrance and check in at the main information desk   Today you received the following chemotherapy agents Carbo and abraxane  No chemo next week CT scans next week and see Dr. Whitney Muse   If you develop nausea and vomiting, or diarrhea that is not controlled by your medication, call the clinic.  The clinic phone number is [phone]. Office hours are Monday-Friday 8:30am-5:00pm.  BELOW ARE SYMPTOMS THAT SHOULD BE REPORTED IMMEDIATELY:  *FEVER GREATER THAN 101.0 F  *CHILLS WITH OR WITHOUT FEVER  NAUSEA AND VOMITING THAT IS NOT CONTROLLED WITH YOUR NAUSEA MEDICATION  *UNUSUAL SHORTNESS OF BREATH  *UNUSUAL BRUISING OR BLEEDING  TENDERNESS IN MOUTH AND THROAT WITH OR WITHOUT PRESENCE OF ULCERS  *URINARY PROBLEMS  *BOWEL PROBLEMS  UNUSUAL RASH Items with * indicate a potential emergency and should be followed up as soon as possible. If you have an emergency after office hours please contact your primary care physician or go to the nearest emergency department.  Please call the clinic during office hours if you have any questions or concerns.   You may also contact the Patient Navigator at [phone] should you have any questions or need assistance in obtaining follow up care.      Resources For Cancer Patients and their Caregivers ? American Cancer Society: Can assist with transportation, wigs, general needs, runs Look Good Feel Better.        402-138-3488 ? Cancer Care: Provides financial assistance, online support groups, medication/co-pay assistance.  1-800-813-HOPE 432-190-1587) ? [facility] Assists [place] cancer patients and their families through  emotional , educational and financial support.  681-758-7860 ? [place] DSS Where to apply for food stamps, Medicaid and utility assistance. 920-609-9884 ? RCATS: Transportation to medical appointments. (541)386-1830 ? Social Security Administration: May apply for disability if have a Stage IV cancer. 514-726-2392 250-756-4417 ? [employer], Disability and Transit Services: Assists with nutrition, care and transit needs. [phone]

## 2016-02-27 ENCOUNTER — Ambulatory Visit (HOSPITAL_COMMUNITY)
Admission: RE | Admit: 2016-02-27 | Discharge: 2016-02-27 | Disposition: A | Discharge status: Home / Self Care | Payer: Medicare HMO | Source: Ambulatory Visit | Attending: Oncology | Admitting: Oncology

## 2016-02-27 DIAGNOSIS — R971 Elevated cancer antigen 125 [CA 125]: Secondary | ICD-10-CM | POA: Insufficient documentation

## 2016-02-27 DIAGNOSIS — C786 Secondary malignant neoplasm of retroperitoneum and peritoneum: Secondary | ICD-10-CM | POA: Diagnosis not present

## 2016-02-27 DIAGNOSIS — C482 Malignant neoplasm of peritoneum, unspecified: Secondary | ICD-10-CM | POA: Insufficient documentation

## 2016-02-27 MED ORDER — IOHEXOL 300 MG/ML  SOLN
80.0000 mL | Freq: Once | INTRAMUSCULAR | Status: AC | PRN
  Administered 2016-02-27: 80 mL via INTRAVENOUS

## 2016-02-28 ENCOUNTER — Other Ambulatory Visit (HOSPITAL_COMMUNITY): Payer: Medicare HMO

## 2016-02-29 ENCOUNTER — Inpatient Hospital Stay (HOSPITAL_COMMUNITY): Payer: Medicare HMO

## 2016-02-29 ENCOUNTER — Encounter (HOSPITAL_COMMUNITY): Payer: Self-pay | Admitting: Hematology & Oncology

## 2016-02-29 ENCOUNTER — Encounter (HOSPITAL_BASED_OUTPATIENT_CLINIC_OR_DEPARTMENT_OTHER): Payer: Medicare HMO | Admitting: Hematology & Oncology

## 2016-02-29 VITALS — BP 136/69 | HR 92 | Temp 97.7°F | Resp 18 | Wt 175.0 lb

## 2016-02-29 DIAGNOSIS — Z66 Do not resuscitate: Secondary | ICD-10-CM

## 2016-02-29 DIAGNOSIS — R5383 Other fatigue: Secondary | ICD-10-CM | POA: Diagnosis not present

## 2016-02-29 DIAGNOSIS — C482 Malignant neoplasm of peritoneum, unspecified: Secondary | ICD-10-CM

## 2016-02-29 DIAGNOSIS — G8929 Other chronic pain: Secondary | ICD-10-CM | POA: Diagnosis not present

## 2016-02-29 DIAGNOSIS — R530 Neoplastic (malignant) related fatigue: Secondary | ICD-10-CM

## 2016-02-29 NOTE — Progress Notes (Signed)
Sarah Bogus, MD [place] [address]    Primary peritoneal carcinomatosis [facility])   07/25/2014 Pathology Results Left pleural effusion positive for malignant cell consistent with adenocarcinoma.   07/27/2014 - 08/05/2014 Hospital Admission Heart failure with malignant pleural effusion   07/28/2014 Pathology Results Left pleural effusion positive for malignant cells consistent with adenocarcinoma (CK7 pos, CK20 neg, WT-1 pos, ER weakly pos, PR neg, TTF-1 neg, GCDFP-15 neg).  Findings favor gyn/ovarian primary.   08/01/2014 Pathology Results [facility]- [facility] genomic findings: BRCA2 R2313f*31, STK11 loss, MYC amplification-equivocal, TP53 R273C, MLL2 QQ6761.  Targeted treatments include Olaparib (BRCA2) and Everolimus/Temsirolimus (STK11).   08/03/2014 Tumor Marker CA 125 4954   08/04/2014 Procedure Left pleurX catheter by Dr. VLucianne LeiTright   09/08/2014 - 09/14/2014 Hospital Admission Hospitalized with the following main issues: acute on chronic CHF, acute on chronic respiratory failure with hypoxia, and malignant plueral effusion on right    09/13/2014 Procedure Right PleurX catheter by Dr. BCyndia Bent  09/13/2014 Pathology Results Right pleural effusion positive for malignant adenocarcinoma   09/21/2014 Tumor Marker CA 125 3869   09/22/2014 - 11/17/2014 Chemotherapy Carboplatin/Paclitaxel days 1, 8, 15 every 28 days.   10/20/2014 Tumor Marker CA 125 335   11/23/2014 - 02/23/2015 Chemotherapy Carboplatin/Abraxane day 1, 8, 15 every 28 days.   06/06/2015 Tumor Marker CA 125: 21.7    08/04/2015 Tumor Marker CA 125: 23.6    08/17/2015 Imaging CT abd/pelvis- Resolution of ascites and pleural effusions since previous study. Near complete resolution of soft tissue stranding and nodularity in the omental and mesenteric fat.  Resolution of left inguinal lymphadenopathy since prior exam.   10/10/2015 Tumor Marker CA 125: 47.4 (H)    11/30/2015 Tumor Marker CA 125: 101.2 (H)   12/25/2015 Tumor Marker CA 125: 118.4 (H)    12/29/2015 Progression    12/29/2015 Imaging CT CAP- progression of peritoneal carcinomatosis, with interval increase in number and size of numerous large lesions predominantly in the low anatomic pelvis   01/11/2016 -  Chemotherapy Carboplatin/Abraxane days 1, 8, 15 every 21 days. Avastin   02/01/2016 Imaging MRI L-spine- Leftward disc protrusion at L5-S1 with slight progression of moderate left and mild right foraminal stenosis. Left subarticular narrowing at L5-S1 is not significantly changed. Mild left foraminal narrowing at L4-5 is stable.   02/27/2016 Imaging CT CAP- Improving peritoneal carcinomatosis in the pelvis, as above. No evidence of metastatic disease in the chest.     INTERVAL HISTORY: Sarah ROTHMAN89y.o. female returns for follow-up of her primary peritoneal carcinoma. She has restarted therapy.  [name] here with boyfriend, son and daughter in law.   She says that she has had some good days and not good days. She complains of chronic fatigue. On the not good days she says that the day goes on forever but she reads and watches TV, but she does not sleep all day. On the good days she does not cough as much and goes out riding.  She denies chest pain, vomiting, nausea, and belly pain. She states that her bowels are good. Her breathing is baseline. She complains of weakness.   She is here to review imaging studies.  MEDICAL HISTORY: Past Medical History  Diagnosis Date  . GERD (gastroesophageal reflux disease)   . Osteoarthritis   . Peripheral edema   . CHF (congestive heart failure), NYHA class IV (HCC)     diastolic  . UTI (lower urinary tract infection)   .  Cancer ([place])   . Ovarian cancer ([place])   . DNR (do not resuscitate) 01/24/2016    09/14/2014    has GERD; CONSTIPATION, CHRONIC; HEMATOCHEZIA; OSTEOARTHRITIS, LOWER LEG; KNEE PAIN; BURSITIS, KNEE; Dysphagia; Malignant pleural effusion; Edema of both legs; Heart failure,  diastolic, with acute decompensation ([place]); Hyperkalemia; Anemia, unspecified; Rectal bleed; Chest pain; Protein-calorie malnutrition, severe ([place]); Dysphagia, pharyngoesophageal phase; Constipation; Acute on chronic congestive heart failure ([place]); Acute on chronic respiratory failure with hypoxia ([place]); Primary peritoneal carcinomatosis ([place]); Anemia due to chemotherapy; Cellulitis; Genetic testing; and DNR (do not resuscitate) on her problem list.    is allergic to penicillins.  [name] does not currently have medications on file.  SURGICAL HISTORY: Past Surgical History  Procedure Laterality Date  . Cholecystectomy    . Cardiac catheterization    . Total abdominal hysterectomy  40 years ago    patient does not know if ovaries were removed  . Appendectomy    . Total knee arthroplasty Left   . Chest tube insertion Left 08/04/2014    Procedure: INSERTION PLEURAL DRAINAGE CATHETER;  Surgeon: Ivin Poot, MD;  Location: [facility];  Service: Thoracic;  Laterality: Left;  . Chest tube insertion Right 09/13/2014    Procedure: INSERTION PLEURAL DRAINAGE CATHETER;  Surgeon: Gaye Pollack, MD;  Location: [facility];  Service: Thoracic;  Laterality: Right;  . Removal of pleural drainage catheter Bilateral 12/06/2014    Procedure: REMOVAL OF PLEURAL DRAINAGE CATHETER;  Surgeon: Ivin Poot, MD;  Location: [facility];  Service: Thoracic;  Laterality: Bilateral;    SOCIAL HISTORY: Social History   Social History  . Marital Status: Widowed    Spouse Name: N/A  . Number of Children: 4  . Years of Education: N/A   Occupational History  . nurses aid    Social History Main Topics  . Smoking status: Never Smoker   . Smokeless tobacco: Not on file  . Alcohol Use: No  . Drug Use: No  . Sexual Activity: Not on file   Other Topics Concern  . Not on file   Social History Narrative   FAMILY HISTORY: Noncontributory  Review of Systems  Constitutional: Negative for fever, chills, fatigue, and weight  loss.  HENT: Negative for congestion, hearing loss, nosebleeds, sore throat and tinnitus.   Eyes: Negative for blurred vision, double vision, pain and discharge.  Respiratory:  Negative for hemoptysis, shortness of breath and wheezing.  Positive for difficulty breathing. (chronic) Cough is primarily in the morning and at night.  Cardiovascular: Negative for chest pain, palpitations, claudication, leg swelling and PND.  Gastrointestinal: Negative for heartburn, nausea, vomiting, abdominal pain, diarrhea, constipation, blood in stool and melena.  Genitourinary: Negative for hematuria, urgency, frequency, dysuria. Musculoskeletal: Positive for back pain. Negative for myalgias, joint pain and falls.  Skin: Negative for itching and rash.  Neurological: Negative for dizziness, tingling, tremors, speech change, focal weakness, sensory changes, seizures, loss of consciousness, weakness and headaches.  Endo/Heme/Allergies: Does not bruise/bleed easily.  Psychiatric/Behavioral: Negative for depression, suicidal ideas, memory loss and substance abuse. The patient is not nervous/anxious and does not have insomnia.   14 point review of systems was performed and is negative except as detailed under history of present illness and above   PHYSICAL EXAMINATION ECOG PERFORMANCE STATUS: 1 - Symptomatic but completely ambulatory  Filed Vitals:   02/29/16 1200  BP: 136/69  Pulse: 92  Temp: 97.7 F (36.5 C)  Resp: 18   Physical Exam  Constitutional: She is  oriented to person, place, and time and well-developed, well-nourished, and in no distress.  Able to get on exam table with assistance. HENT:  Head: Normocephalic and atraumatic.  Mouth/Throat: No oropharyngeal exudate.  Eyes: Conjunctivae and EOM are normal. Pupils are equal, round, and reactive to light. No scleral icterus.  Neck: Normal range of motion. Neck supple. No JVD present. No thyromegaly present.  Cardiovascular: Normal rate and regular  rhythm.  Exam reveals no friction rub.   No murmur heard. Pulmonary/Chest: Effort normal and breath sounds normal. No respiratory distress. She has no wheezes. She has no rales.  Abdominal: Soft. Bowel sounds are normal. She exhibits no distension. There is no rebound and no guarding. No palpable mass Musculoskeletal: Normal range of motion.  Lymphadenopathy:    She has no cervical adenopathy.  Neurological: She is alert and oriented to person, place, and time. No cranial nerve deficit. Coordination normal.  Skin: Skin is warm and dry.  Psychiatric: Mood, memory, affect and judgment normal. somewhat flat affect  LABORATORY DATA: I have reviewed the data as listed. CBC    Component Value Date/Time   WBC 5.4 02/21/2016 1154   RBC 4.34 02/21/2016 1154   RBC 4.29 07/27/2014 2257   HGB 11.1* 02/21/2016 1154   HCT 35.2* 02/21/2016 1154   PLT 210 02/21/2016 1154   MCV 81.1 02/21/2016 1154   MCH 25.6* 02/21/2016 1154   MCHC 31.5 02/21/2016 1154   RDW 22.7* 02/21/2016 1154   LYMPHSABS 1.6 02/21/2016 1154   MONOABS 0.2 02/21/2016 1154   EOSABS 0.1 02/21/2016 1154   BASOSABS 0.0 02/21/2016 1154   CMP     Component Value Date/Time   NA 137 02/21/2016 1154   K 4.1 02/21/2016 1154   CL 101 02/21/2016 1154   CO2 25 02/21/2016 1154   GLUCOSE 118* 02/21/2016 1154   BUN 26* 02/21/2016 1154   CREATININE 1.13* 02/21/2016 1154   CALCIUM 8.4* 02/21/2016 1154   PROT 7.0 02/21/2016 1154   ALBUMIN 3.5 02/21/2016 1154   AST 26 02/21/2016 1154   ALT 14 02/21/2016 1154   ALKPHOS 54 02/21/2016 1154   BILITOT 0.7 02/21/2016 1154   GFRNONAA 45* 02/21/2016 1154   GFRAA 52* 02/21/2016 1154     RADIOLOGY: I have personally reviewed the radiological images as listed and agreed with the findings in the report. Study Result     CLINICAL DATA: Peritoneal carcinomatosis, increasing CA 125, status post chemotherapy 1 week ago. Prior appendectomy and hysterectomy.  EXAM: CT CHEST, ABDOMEN, AND  PELVIS WITH CONTRAST  TECHNIQUE: Multidetector CT imaging of the chest, abdomen and pelvis was performed following the standard protocol during bolus administration of intravenous contrast.  CONTRAST: 36m OMNIPAQUE IOHEXOL 300 MG/ML SOLN  COMPARISON: 12/29/2015)  FINDINGS: CT CHEST FINDINGS  Mediastinum/Nodes: The heart is normal in size. No pericardial effusion.  Coronary atherosclerosis of the LAD.  Atherosclerotic calcifications of the aortic arch.  No suspicious mediastinal lymphadenopathy.  Right chest port terminates at the cavoatrial junction.  Visualized right thyroid is mildly heterogeneous.  Lungs/Pleura: Mild lingular scarring.  No suspicious pulmonary nodules.  Mild biapical pleural-parenchymal scarring.  No focal consolidation.  No pleural effusion or pneumothorax.  Musculoskeletal: Mild degenerative changes of the thoracic spine.  CT ABDOMEN PELVIS FINDINGS  Hepatobiliary: Liver is within normal limits. No suspicious/enhancing hepatic lesions.  Status post cholecystectomy. Mild intrahepatic ductal dilatation. Common duct measures 11 mm and smoothly tapers at the ampulla, unchanged.  Pancreas: Within normal limits.  Spleen: Within normal limits.  Adrenals/Urinary Tract: Adrenal glands are within normal limits.  7 mm right lower pole renal cyst (series 3/image 28). Kidneys are otherwise within normal limits. No hydronephrosis.  Bladder is within normal limits.  Stomach/Bowel: Stomach is notable for a moderate hiatal hernia.  No evidence of bowel obstruction.  Prior appendectomy.  Sigmoid diverticulosis, with mild chronic wall thickening/mucosal hypertrophy (series 2/image 103), but no associated inflammatory changes to suggest acute diverticulitis.  Vascular/Lymphatic: Atherosclerotic calcifications of the abdominal aorta and branch vessels. No evidence of abdominal aortic aneurysm.  No suspicious  abdominopelvic lymphadenopathy.  Reproductive: Status post hysterectomy.  Bilateral ovaries are not discretely visualized.  Other: Multiple pelvic implants, mildly decreased, including:  5.1 x 4.3 cm right adnexal implant (series 2/ image 102), previously 6.0 x 4.8 cm  3.1 x 4.9 cm left adnexal implant adjacent to the sigmoid colon (series 2/ image 107), previously 7.1 x 4.2 cm  5.0 x 3.8 cm implant in the right pelvic cul-de-sac (series 2/ image 109), previously 5.4 x 3.8 cm  Additional cystic lesion along the sigmoid mesocolon is also decreased (series 2/image 108).  Musculoskeletal: Mild degenerative changes of the lumbar spine.  IMPRESSION: Improving peritoneal carcinomatosis in the pelvis, as above.  No evidence of metastatic disease in the chest.   Electronically Signed  By: Julian Hy M.D.  On: 02/27/2016 16:53     ASSESSMENT and THERAPY PLAN:  Stage IV primary peritoneal carcinoma Chronic L side, Left lower back pain Recurrent peritoneal carcinoma  CT imaging shows some improvement in her disease. We will continue to monitor CA-125 levels moving forward and her PS. We again addressed her QOL. This is important to her. We have discussed end of life issues and will continue to address these moving forward.  Today with her son present we addressed discontinuing versus continuing with therapy, for now she would like to continue but she notes if she continues to get weaker she may consider discontinuing all therapy.    I have recommended home health for PT for strengthening. Her son notes that it has been recommended that they see pain medicine for her chronic back pain and I advised him that this may not be unreasonable. It may improve her pain and allow her to increase activity.   All questions were answered. The patient knows to call the clinic with any problems, questions or concerns. We can certainly see the patient much sooner if necessary.     This note was signed electronically.  This document serves as a record of services personally performed by Ancil Linsey, MD. It was created on her behalf by Kandace Blitz, a trained medical scribe. The creation of this record is based on the scribe's personal observations and the provider's statements to them. This document has been checked and approved by the attending provider.  I have reviewed the above documentation for accuracy and completeness and I agree with the above.  Kelby Fam. Whitney Muse, MD

## 2016-02-29 NOTE — Patient Instructions (Addendum)
[place] at [facility] Discharge Instructions  RECOMMENDATIONS MADE BY THE CONSULTANT AND ANY TEST RESULTS WILL BE SENT TO YOUR REFERRING PHYSICIAN.   Exam and discussion by Dr Whitney Muse today Your scans are improving.   Chemotherapy next week Referral for home health physical therapy  Return to see the doctor in 2 weeks with treatment  Please call the clinic if you have any questions or concerns    Thank you for choosing [place] at [facility] to provide your oncology and hematology care.  To afford each patient quality time with our provider, please arrive at least 15 minutes before your scheduled appointment time.   Beginning January 23rd 2017 lab work for the [facility] will be done in the  Main lab at [facility] on 1st floor. If you have a lab appointment with the [facility] please come in thru the  Main Entrance and check in at the main information desk  You need to re-schedule your appointment should you arrive 10 or more minutes late.  We strive to give you quality time with our providers, and arriving late affects you and other patients whose appointments are after yours.  Also, if you no show three or more times for appointments you may be dismissed from the clinic at the providers discretion.     Again, thank you for choosing [facility].  Our hope is that these requests will decrease the amount of time that you wait before being seen by our physicians.       _____________________________________________________________  Should you have questions after your visit to [facility], please contact our office at [phone] between the hours of 8:30 a.m. and 4:30 p.m.  Voicemails left after 4:30 p.m. will not be returned until the following business day.  For prescription refill requests, have your pharmacy contact our office.         Resources For Cancer Patients and their  Caregivers ? American Cancer Society: Can assist with transportation, wigs, general needs, runs Look Good Feel Better.        ([phone] ? Cancer Care: Provides financial assistance, online support groups, medication/co-pay assistance.  1-800-813-HOPE (406)303-7046) ? [facility] Assists [place] cancer patients and their families through emotional , educational and financial support.  ([phone] ? [place] DSS Where to apply for food stamps, Medicaid and utility assistance. 972-456-8963 ? [facility]: Transportation to medical appointments. ([phone] ? Social Security Administration: May apply for disability if have a Stage IV cancer. [phone] [phone] ? [employer], Disability and Transit Services: Assists with nutrition, care and transit needs. [phone]

## 2016-03-01 ENCOUNTER — Ambulatory Visit (HOSPITAL_COMMUNITY): Payer: Medicare HMO | Admitting: Hematology & Oncology

## 2016-03-06 ENCOUNTER — Encounter (HOSPITAL_COMMUNITY): Payer: Medicare HMO | Attending: Hematology

## 2016-03-06 DIAGNOSIS — D631 Anemia in chronic kidney disease: Secondary | ICD-10-CM | POA: Diagnosis present

## 2016-03-06 DIAGNOSIS — C569 Malignant neoplasm of unspecified ovary: Secondary | ICD-10-CM | POA: Diagnosis present

## 2016-03-06 DIAGNOSIS — N189 Chronic kidney disease, unspecified: Secondary | ICD-10-CM | POA: Insufficient documentation

## 2016-03-06 DIAGNOSIS — C482 Malignant neoplasm of peritoneum, unspecified: Secondary | ICD-10-CM | POA: Insufficient documentation

## 2016-03-06 LAB — COMPREHENSIVE METABOLIC PANEL
ALBUMIN: 3.2 g/dL — AB (ref 3.5–5.0)
ALT: 14 U/L (ref 14–54)
AST: 23 U/L (ref 15–41)
Alkaline Phosphatase: 57 U/L (ref 38–126)
Anion gap: 9 (ref 5–15)
BUN: 19 mg/dL (ref 6–20)
CHLORIDE: 104 mmol/L (ref 101–111)
CO2: 26 mmol/L (ref 22–32)
CREATININE: 0.91 mg/dL (ref 0.44–1.00)
Calcium: 8.3 mg/dL — ABNORMAL LOW (ref 8.9–10.3)
GFR calc Af Amer: >60 mL/min (ref >60)
GFR, EST NON AFRICAN AMERICAN: 58 mL/min — AB (ref >60)
GLUCOSE: 117 mg/dL — AB (ref 65–99)
Potassium: 4 mmol/L (ref 3.5–5.1)
Sodium: 139 mmol/L (ref 135–145)
Total Bilirubin: 0.4 mg/dL (ref 0.3–1.2)
Total Protein: 6.7 g/dL (ref 6.5–8.1)

## 2016-03-06 LAB — CBC WITH DIFFERENTIAL/PLATELET
BASOS ABS: 0.1 10*3/uL (ref 0.0–0.1)
BASOS PCT: 1 %
EOS PCT: 1 %
Eosinophils Absolute: 0.1 10*3/uL (ref 0.0–0.7)
HCT: 34.7 % — ABNORMAL LOW (ref 36.0–46.0)
Hemoglobin: 10.9 g/dL — ABNORMAL LOW (ref 12.0–15.0)
LYMPHS PCT: 31 %
Lymphs Abs: 2 10*3/uL (ref 0.7–4.0)
MCH: 26.3 pg (ref 26.0–34.0)
MCHC: 31.4 g/dL (ref 30.0–36.0)
MCV: 83.6 fL (ref 78.0–100.0)
Monocytes Absolute: 0.7 10*3/uL (ref 0.1–1.0)
Monocytes Relative: 11 %
NEUTROS ABS: 3.8 10*3/uL (ref 1.7–7.7)
Neutrophils Relative %: 57 %
PLATELETS: 235 10*3/uL (ref 150–400)
RBC: 4.15 MIL/uL (ref 3.87–5.11)
RDW: 24.1 % — ABNORMAL HIGH (ref 11.5–15.5)
WBC: 6.6 10*3/uL (ref 4.0–10.5)

## 2016-03-07 ENCOUNTER — Encounter (HOSPITAL_BASED_OUTPATIENT_CLINIC_OR_DEPARTMENT_OTHER): Payer: Medicare HMO

## 2016-03-07 VITALS — BP 126/74 | HR 78 | Temp 97.7°F | Resp 16 | Wt 174.2 lb

## 2016-03-07 DIAGNOSIS — Z5111 Encounter for antineoplastic chemotherapy: Secondary | ICD-10-CM

## 2016-03-07 DIAGNOSIS — Z5112 Encounter for antineoplastic immunotherapy: Secondary | ICD-10-CM

## 2016-03-07 DIAGNOSIS — C482 Malignant neoplasm of peritoneum, unspecified: Secondary | ICD-10-CM

## 2016-03-07 LAB — CA 125: CA 125: 483.2 U/mL — ABNORMAL HIGH (ref 0.0–38.1)

## 2016-03-07 MED ORDER — SODIUM CHLORIDE 0.9 % IV SOLN
Freq: Once | INTRAVENOUS | Status: AC
  Administered 2016-03-07: 10:00:00 via INTRAVENOUS

## 2016-03-07 MED ORDER — PALONOSETRON HCL INJECTION 0.25 MG/5ML
INTRAVENOUS | Status: AC
  Filled 2016-03-07: qty 5

## 2016-03-07 MED ORDER — SODIUM CHLORIDE 0.9% FLUSH
10.0000 mL | INTRAVENOUS | Status: DC | PRN
Start: 2016-03-07 — End: 2016-03-07

## 2016-03-07 MED ORDER — HEPARIN SOD (PORK) LOCK FLUSH 100 UNIT/ML IV SOLN
500.0000 [IU] | Freq: Once | INTRAVENOUS | Status: AC | PRN
  Administered 2016-03-07: 500 [IU]
  Filled 2016-03-07: qty 5

## 2016-03-07 MED ORDER — SODIUM CHLORIDE 0.9 % IV SOLN
169.6000 mg | Freq: Once | INTRAVENOUS | Status: AC
  Administered 2016-03-07: 170 mg via INTRAVENOUS
  Filled 2016-03-07: qty 17

## 2016-03-07 MED ORDER — PALONOSETRON HCL INJECTION 0.25 MG/5ML
0.2500 mg | Freq: Once | INTRAVENOUS | Status: AC
  Administered 2016-03-07: 0.25 mg via INTRAVENOUS

## 2016-03-07 MED ORDER — SODIUM CHLORIDE 0.9 % IV SOLN
Freq: Once | INTRAVENOUS | Status: AC
  Administered 2016-03-07: 10:00:00 via INTRAVENOUS
  Filled 2016-03-07: qty 5

## 2016-03-07 MED ORDER — PACLITAXEL PROTEIN-BOUND CHEMO INJECTION 100 MG
80.0000 mg/m2 | Freq: Once | INTRAVENOUS | Status: AC
  Administered 2016-03-07: 150 mg via INTRAVENOUS
  Filled 2016-03-07: qty 30

## 2016-03-07 MED ORDER — SODIUM CHLORIDE 0.9 % IV SOLN
15.0000 mg/kg | Freq: Once | INTRAVENOUS | Status: AC
  Administered 2016-03-07: 1250 mg via INTRAVENOUS
  Filled 2016-03-07: qty 48

## 2016-03-07 NOTE — Patient Instructions (Signed)
[facility] Discharge Instructions for Patients Receiving Chemotherapy   Beginning January 23rd 2017 lab work for the [facility] will be done in the  Main lab at [facility] on 1st floor. If you have a lab appointment with the [facility] please come in thru the  Main Entrance and check in at the main information desk   Today you received the following chemotherapy agents: Avastin, Carboplatin, and Abraxane.     If you develop nausea and vomiting, or diarrhea that is not controlled by your medication, call the clinic.  The clinic phone number is [phone]. Office hours are Monday-Friday 8:30am-5:00pm.  BELOW ARE SYMPTOMS THAT SHOULD BE REPORTED IMMEDIATELY:  *FEVER GREATER THAN 101.0 F  *CHILLS WITH OR WITHOUT FEVER  NAUSEA AND VOMITING THAT IS NOT CONTROLLED WITH YOUR NAUSEA MEDICATION  *UNUSUAL SHORTNESS OF BREATH  *UNUSUAL BRUISING OR BLEEDING  TENDERNESS IN MOUTH AND THROAT WITH OR WITHOUT PRESENCE OF ULCERS  *URINARY PROBLEMS  *BOWEL PROBLEMS  UNUSUAL RASH Items with * indicate a potential emergency and should be followed up as soon as possible. If you have an emergency after office hours please contact your primary care physician or go to the nearest emergency department.  Please call the clinic during office hours if you have any questions or concerns.   You may also contact the Patient Navigator at [phone] should you have any questions or need assistance in obtaining follow up care.      Resources For Cancer Patients and their Caregivers ? American Cancer Society: Can assist with transportation, wigs, general needs, runs Look Good Feel Better.        248-506-0333 ? Cancer Care: Provides financial assistance, online support groups, medication/co-pay assistance.  1-800-813-HOPE 313-132-0573) ? [place] Assists [place] cancer patients and their families through emotional , educational and financial  support.  203-701-9570 ? [place] DSS Where to apply for food stamps, Medicaid and utility assistance. (727)052-0006 ? [employer]: Transportation to medical appointments. 859-612-2932 ? Social Security Administration: May apply for disability if have a Stage IV cancer. (234) 152-8742 6287590361 ? [employer], Disability and Transit Services: Assists with nutrition, care and transit needs. 346-218-2470

## 2016-03-07 NOTE — Progress Notes (Signed)
Patient tolerated infusion well.  VSS.   

## 2016-03-12 ENCOUNTER — Other Ambulatory Visit (HOSPITAL_COMMUNITY): Payer: Self-pay | Admitting: Pulmonary Disease

## 2016-03-12 ENCOUNTER — Ambulatory Visit (HOSPITAL_COMMUNITY)
Admission: RE | Admit: 2016-03-12 | Discharge: 2016-03-12 | Disposition: A | Discharge status: Home / Self Care | Payer: Medicare HMO | Source: Ambulatory Visit | Attending: Pulmonary Disease | Admitting: Pulmonary Disease

## 2016-03-12 ENCOUNTER — Encounter (HOSPITAL_COMMUNITY): Payer: Medicare HMO

## 2016-03-12 DIAGNOSIS — R05 Cough: Secondary | ICD-10-CM | POA: Insufficient documentation

## 2016-03-12 DIAGNOSIS — R059 Cough, unspecified: Secondary | ICD-10-CM

## 2016-03-12 DIAGNOSIS — J90 Pleural effusion, not elsewhere classified: Secondary | ICD-10-CM | POA: Diagnosis not present

## 2016-03-12 DIAGNOSIS — R918 Other nonspecific abnormal finding of lung field: Secondary | ICD-10-CM | POA: Diagnosis not present

## 2016-03-12 LAB — CBC WITH DIFFERENTIAL/PLATELET
Basophils Absolute: 0 10*3/uL (ref 0.0–0.1)
Basophils Relative: 0 %
Eosinophils Absolute: 0 10*3/uL (ref 0.0–0.7)
Eosinophils Relative: 0 %
HCT: 35.3 % — ABNORMAL LOW (ref 36.0–46.0)
HEMOGLOBIN: 11.1 g/dL — AB (ref 12.0–15.0)
LYMPHS ABS: 1.9 10*3/uL (ref 0.7–4.0)
LYMPHS PCT: 19 %
MCH: 26.4 pg (ref 26.0–34.0)
MCHC: 31.4 g/dL (ref 30.0–36.0)
MCV: 84 fL (ref 78.0–100.0)
Monocytes Absolute: 0.5 10*3/uL (ref 0.1–1.0)
Monocytes Relative: 5 %
NEUTROS ABS: 7.5 10*3/uL (ref 1.7–7.7)
NEUTROS PCT: 76 %
Platelets: 246 10*3/uL (ref 150–400)
RBC: 4.2 MIL/uL (ref 3.87–5.11)
RDW: 23.4 % — ABNORMAL HIGH (ref 11.5–15.5)
WBC: 10 10*3/uL (ref 4.0–10.5)

## 2016-03-12 LAB — COMPREHENSIVE METABOLIC PANEL
ALK PHOS: 57 U/L (ref 38–126)
ALT: 14 U/L (ref 14–54)
AST: 20 U/L (ref 15–41)
Albumin: 3.1 g/dL — ABNORMAL LOW (ref 3.5–5.0)
Anion gap: 13 (ref 5–15)
BUN: 18 mg/dL (ref 6–20)
CALCIUM: 8.4 mg/dL — AB (ref 8.9–10.3)
CO2: 26 mmol/L (ref 22–32)
CREATININE: 0.95 mg/dL (ref 0.44–1.00)
Chloride: 97 mmol/L — ABNORMAL LOW (ref 101–111)
GFR, EST NON AFRICAN AMERICAN: 55 mL/min — AB (ref >60)
Glucose, Bld: 112 mg/dL — ABNORMAL HIGH (ref 65–99)
Potassium: 4.1 mmol/L (ref 3.5–5.1)
SODIUM: 136 mmol/L (ref 135–145)
Total Bilirubin: 0.7 mg/dL (ref 0.3–1.2)
Total Protein: 7.2 g/dL (ref 6.5–8.1)

## 2016-03-13 ENCOUNTER — Other Ambulatory Visit (HOSPITAL_COMMUNITY): Payer: Medicare HMO

## 2016-03-13 ENCOUNTER — Other Ambulatory Visit (HOSPITAL_COMMUNITY): Payer: Self-pay | Admitting: Oncology

## 2016-03-14 ENCOUNTER — Ambulatory Visit (HOSPITAL_COMMUNITY): Payer: Medicare HMO | Admitting: Oncology

## 2016-03-14 ENCOUNTER — Inpatient Hospital Stay (HOSPITAL_COMMUNITY): Payer: Medicare HMO

## 2016-03-20 ENCOUNTER — Encounter (HOSPITAL_COMMUNITY): Payer: Medicare HMO

## 2016-03-20 DIAGNOSIS — C482 Malignant neoplasm of peritoneum, unspecified: Secondary | ICD-10-CM | POA: Diagnosis not present

## 2016-03-20 DIAGNOSIS — C569 Malignant neoplasm of unspecified ovary: Secondary | ICD-10-CM

## 2016-03-20 LAB — COMPREHENSIVE METABOLIC PANEL
ALBUMIN: 3 g/dL — AB (ref 3.5–5.0)
ALT: 13 U/L — ABNORMAL LOW (ref 14–54)
ANION GAP: 11 (ref 5–15)
AST: 16 U/L (ref 15–41)
Alkaline Phosphatase: 59 U/L (ref 38–126)
BUN: 19 mg/dL (ref 6–20)
CHLORIDE: 101 mmol/L (ref 101–111)
CO2: 27 mmol/L (ref 22–32)
Calcium: 8.7 mg/dL — ABNORMAL LOW (ref 8.9–10.3)
Creatinine, Ser: 0.97 mg/dL (ref 0.44–1.00)
GFR calc Af Amer: >60 mL/min (ref >60)
GFR calc non Af Amer: 54 mL/min — ABNORMAL LOW (ref >60)
GLUCOSE: 105 mg/dL — AB (ref 65–99)
POTASSIUM: 4 mmol/L (ref 3.5–5.1)
SODIUM: 139 mmol/L (ref 135–145)
TOTAL PROTEIN: 6.8 g/dL (ref 6.5–8.1)
Total Bilirubin: 0.3 mg/dL (ref 0.3–1.2)

## 2016-03-20 LAB — CBC WITH DIFFERENTIAL/PLATELET
BASOS ABS: 0 10*3/uL (ref 0.0–0.1)
BASOS PCT: 0 %
EOS ABS: 0.1 10*3/uL (ref 0.0–0.7)
EOS PCT: 2 %
HCT: 35 % — ABNORMAL LOW (ref 36.0–46.0)
Hemoglobin: 11 g/dL — ABNORMAL LOW (ref 12.0–15.0)
Lymphocytes Relative: 23 %
Lymphs Abs: 1.7 10*3/uL (ref 0.7–4.0)
MCH: 26.6 pg (ref 26.0–34.0)
MCHC: 31.4 g/dL (ref 30.0–36.0)
MCV: 84.7 fL (ref 78.0–100.0)
MONO ABS: 0.7 10*3/uL (ref 0.1–1.0)
MONOS PCT: 9 %
NEUTROS ABS: 4.6 10*3/uL (ref 1.7–7.7)
Neutrophils Relative %: 65 %
PLATELETS: 225 10*3/uL (ref 150–400)
RBC: 4.13 MIL/uL (ref 3.87–5.11)
RDW: 23.6 % — AB (ref 11.5–15.5)
WBC: 7.1 10*3/uL (ref 4.0–10.5)

## 2016-03-21 ENCOUNTER — Encounter (HOSPITAL_BASED_OUTPATIENT_CLINIC_OR_DEPARTMENT_OTHER): Payer: Medicare HMO | Admitting: Oncology

## 2016-03-21 ENCOUNTER — Encounter (HOSPITAL_COMMUNITY): Payer: Medicare HMO

## 2016-03-21 ENCOUNTER — Encounter (HOSPITAL_COMMUNITY): Payer: Self-pay | Admitting: Oncology

## 2016-03-21 VITALS — BP 126/67 | HR 95 | Temp 98.1°F | Resp 18 | Wt 175.3 lb

## 2016-03-21 DIAGNOSIS — R05 Cough: Secondary | ICD-10-CM

## 2016-03-21 DIAGNOSIS — R82998 Other abnormal findings in urine: Secondary | ICD-10-CM

## 2016-03-21 DIAGNOSIS — C482 Malignant neoplasm of peritoneum, unspecified: Secondary | ICD-10-CM

## 2016-03-21 DIAGNOSIS — R059 Cough, unspecified: Secondary | ICD-10-CM

## 2016-03-21 DIAGNOSIS — R8271 Bacteriuria: Secondary | ICD-10-CM

## 2016-03-21 DIAGNOSIS — R8290 Unspecified abnormal findings in urine: Secondary | ICD-10-CM | POA: Diagnosis not present

## 2016-03-21 LAB — URINALYSIS, ROUTINE W REFLEX MICROSCOPIC
BILIRUBIN URINE: NEGATIVE
Glucose, UA: NEGATIVE mg/dL
KETONES UR: NEGATIVE mg/dL
Nitrite: NEGATIVE
PROTEIN: NEGATIVE mg/dL
SPECIFIC GRAVITY, URINE: 1.01 (ref 1.005–1.030)
pH: 5.5 (ref 5.0–8.0)

## 2016-03-21 LAB — URINE MICROSCOPIC-ADD ON

## 2016-03-21 LAB — CA 125: CA 125: 176.9 U/mL — AB (ref 0.0–38.1)

## 2016-03-21 MED ORDER — BENZONATATE 200 MG PO CAPS: 200.0000 mg | ORAL_CAPSULE | Freq: Three times a day (TID) | ORAL | Status: AC | PRN

## 2016-03-21 MED ORDER — NITROFURANTOIN MONOHYD MACRO 100 MG PO CAPS: 100.0000 mg | ORAL_CAPSULE | Freq: Two times a day (BID) | ORAL | Status: AC

## 2016-03-21 NOTE — Assessment & Plan Note (Signed)
Recurrent peritoneal carcinomatosis, STAGE IV, BRCA 2 POSITIVE.  Oncology history updated.  Labs reviewed. CEA 125 is back to baseline (remains elevated). He would need treatment parameters today, but given the patient's performance status and quality of life, we will hold therapy and discuss future therapeutic options.  I discussed the option of transitioning treatment to maintenance therapy given positive response to therapy thus far on radiographic imaging. Given her quality of life, it is reasonable to move on to maintenance Avastin therapy and monitor for progression of disease moving forward. I discussed the risks, benefits, alternatives, and side effects of this maintenance therapy.  The patient agrees to pursue this option. This will begin next week on 03/28/2016 (her last dose of Avastin with chemotherapy was on 03/07/2016).  Patient reports continued cough that is not productive of yellow/green sputum. She is one more day left of antibiotic. She is utilizing Robitussin-DM over-the-counter for cough suppression. She notes that her cough is keeping her up at night. She will complete her antibiotic course as planned. I have electronically prescribed Tessalon 200 mg 3 times daily as needed for cough.  Patient's caregiver reports dark and malodorous urine. She requests a urine test. Orders place for a urinalysis with reflex. Urinalysis demonstrates positive hemoglobin, leukocytes, and many bacteria. As a result, I will escribed Macrobid for 5 days.  Patient will return next week for Avastin maintenance therapy. Treatment plan is deleted and antibody plan is built reflecting Avastin 15 mg/kg every 3 weeks. Episodes of care are updated as well.  Labs next week: UA dip stick.  Labs every 3 weeks (day 1 of therapy): CBC differential, complete metabolic panel, CEA 500, and urine dipstick.  She will return in 4 weeks for follow-up and cycle #2 of Avastin maintenance.

## 2016-03-21 NOTE — Patient Instructions (Signed)
[place] at [facility] Discharge Instructions  RECOMMENDATIONS MADE BY THE CONSULTANT AND ANY TEST RESULTS WILL BE SENT TO YOUR REFERRING PHYSICIAN.  Exam done and seen today by [facility] chemo today Getting a UA today Going to only do Avastin, will start on 03-28-16 Return to see the Doctor in 4weeks Labs in 4 weeks with UA Call the clinic for any concerns or questions.   Thank you for choosing [place] at [facility] to provide your oncology and hematology care.  To afford each patient quality time with our provider, please arrive at least 15 minutes before your scheduled appointment time.   Beginning January 23rd 2017 lab work for the [facility] will be done in the  Main lab at [facility] on 1st floor. If you have a lab appointment with the [facility] please come in thru the  Main Entrance and check in at the main information desk  You need to re-schedule your appointment should you arrive 10 or more minutes late.  We strive to give you quality time with our providers, and arriving late affects you and other patients whose appointments are after yours.  Also, if you no show three or more times for appointments you may be dismissed from the clinic at the providers discretion.     Again, thank you for choosing [facility].  Our hope is that these requests will decrease the amount of time that you wait before being seen by our physicians.       _____________________________________________________________  Should you have questions after your visit to [facility], please contact our office at [phone] between the hours of 8:30 a.m. and 4:30 p.m.  Voicemails left after 4:30 p.m. will not be returned until the following business day.  For prescription refill requests, have your pharmacy contact our office.         Resources For Cancer Patients and their Caregivers ? American Cancer  Society: Can assist with transportation, wigs, general needs, runs Look Good Feel Better.        660-733-5582 ? Cancer Care: Provides financial assistance, online support groups, medication/co-pay assistance.  1-800-813-HOPE 619-042-5922) ? [place] Assists [place] cancer patients and their families through emotional , educational and financial support.  450 511 4405 ? [place] DSS Where to apply for food stamps, Medicaid and utility assistance. (716)873-8739 ? [employer]: Transportation to medical appointments. 4192895320 ? Social Security Administration: May apply for disability if have a Stage IV cancer. (346) [phone] ? [employer], Disability and Transit Services: Assists with nutrition, care and transit needs. 660-641-8809

## 2016-03-21 NOTE — Progress Notes (Signed)
Sarah Bogus, MD [place] [address]  Primary peritoneal carcinomatosis [facility]) - Plan: Urinalysis, dipstick only, CBC with Differential, Comprehensive metabolic panel, CA 382  Cough - Plan: benzonatate (TESSALON) 200 MG capsule  Malodorous urine - Plan: Urinalysis, Routine w reflex microscopic, Urinalysis, Routine w reflex microscopic, nitrofurantoin, macrocrystal-monohydrate, (MACROBID) 100 MG capsule  Leukocytes in urine - Plan: nitrofurantoin, macrocrystal-monohydrate, (MACROBID) 100 MG capsule  Bacteria in urine - Plan: nitrofurantoin, macrocrystal-monohydrate, (MACROBID) 100 MG capsule  CURRENT THERAPY: Carboplatin/Abraxane days 1, 8, 15 every 21 days beginning on 01/11/2016  INTERVAL HISTORY: [name] 81 y.o. female returns for followup of recurrent peritoneal carcinomatosis, STAGE IV, BRCA 2 POSITIVE.    Primary peritoneal carcinomatosis ([place])   07/25/2014 Pathology Results Left pleural effusion positive for malignant cell consistent with adenocarcinoma.   07/27/2014 - 08/05/2014 Hospital Admission Heart failure with malignant pleural effusion   07/28/2014 Pathology Results Left pleural effusion positive for malignant cells consistent with adenocarcinoma (CK7 pos, CK20 neg, WT-1 pos, ER weakly pos, PR neg, TTF-1 neg, GCDFP-15 neg).  Findings favor gyn/ovarian primary.   08/01/2014 Pathology Results [facility]- Postive genomic findings: BRCA2 [id number]*31, STK11 loss, MYC amplification-equivocal, TP53 R273C, MLL2 [id number].  Targeted treatments include Olaparib (BRCA2) and Everolimus/Temsirolimus (STK11).   08/03/2014 Tumor Marker CA 125 4954   08/04/2014 Procedure Left pleurX catheter by Dr. VLucianne LeiTright   09/08/2014 - 09/14/2014 Hospital Admission Hospitalized with the following main issues: acute on chronic CHF, acute on chronic respiratory failure with hypoxia, and malignant plueral effusion on right    09/13/2014 Procedure Right PleurX  catheter by Dr. BCyndia Bent  09/13/2014 Pathology Results Right pleural effusion positive for malignant adenocarcinoma   09/21/2014 Tumor Marker CA 125 3869   09/22/2014 - 11/17/2014 Chemotherapy Carboplatin/Paclitaxel days 1, 8, 15 every 28 days.   10/20/2014 Tumor Marker CA 125 335   11/23/2014 - 02/23/2015 Chemotherapy Carboplatin/Abraxane day 1, 8, 15 every 28 days.   06/06/2015 Tumor Marker CA 125: 21.7    08/04/2015 Tumor Marker CA 125: 23.6    08/17/2015 Imaging CT abd/pelvis- Resolution of ascites and pleural effusions since previous study. Near complete resolution of soft tissue stranding and nodularity in the omental and mesenteric fat.  Resolution of left inguinal lymphadenopathy since prior exam.   10/10/2015 Tumor Marker CA 125: 47.4 (H)    11/30/2015 Tumor Marker CA 125: 101.2 (H)   12/25/2015 Tumor Marker CA 125: 118.4 (H)    12/29/2015 Progression    12/29/2015 Imaging CT CAP- progression of peritoneal carcinomatosis, with interval increase in number and size of numerous large lesions predominantly in the low anatomic pelvis   01/11/2016 - 03/21/2016 Chemotherapy Carboplatin/Abraxane/Avastin days 1, 8, 15 every 21 days.    02/01/2016 Imaging MRI L-spine- Leftward disc protrusion at L5-S1 with slight progression of moderate left and mild right foraminal stenosis. Left subarticular narrowing at L5-S1 is not significantly changed. Mild left foraminal narrowing at L4-5 is stable.   02/27/2016 Imaging CT CAP- Improving peritoneal carcinomatosis in the pelvis, as above. No evidence of metastatic disease in the chest.   03/21/2016 Treatment Plan Change QOL decline, requiring change in therapy.    Chemotherapy Avastin 15 mg/kg every 21 days (maintenance), scheduled to start on 4/27    I personally reviewed and went over laboratory results with the patient.  The results are noted within this dictation.  Labs yesterday are stable and she meets parameters for treatment.  Her CA125 was significantly  increasing  in the setting of improving imaging studies.  Concern for impending failure was considered, but CA 125 most recently has improved and is near back to baseline, albeit elevated.  She was recently diagnosed and treated for CAP by Dr. Luan Pulling in the setting of chest xray on 03/12/2016 showing small B/L effusions.  I personally reviewed and went over radiographic studies with the patient.  The results are noted within this dictation.  The result of this, chemotherapy was held last week to allow for recovery. She notes that she has one more day of antibiotic. She notes that her cough continues. His nonproductive but when she is able to bring sputum, its white/clear.  We had a frank conversation regarding the patient's performance status. The patient confirms that she has more "bad days" than "good days." She confirms that she spends most of her time resting/sleeping. She no longer goes to church. She no longer performs the hobbies that she used to perform. She notes that she is very tired.  With this information, I question the role of ongoing systemic chemotherapy. I discussed this with Dr. Whitney Muse and she agrees. As a result, I discussed transitioning to maintenance therapy knowing that her CA 125 is back to baseline (remains elevated) with radiographic findings indicating response to therapy. The patient and family/friends are very interested in this option.  I discussed the role of Avastin maintenance. This would be 15 mg/kg every 3 weeks. I reviewed the risks, benefits, alternatives, and side effects with the patient. She's been on Avastin throughout her treatment, that I will remove other therapeutic agents to improve tolerability and hopefully provide the patient an opportunity for improvement in quality of life. The patient wishes to pursue this option. We will reserve PARP inhibition for the future.  The patient's caregiver reports dark and malodorous urine and request a urinalysis.   Past Medical  History  Diagnosis Date  . GERD (gastroesophageal reflux disease)   . Osteoarthritis   . Peripheral edema   . CHF (congestive heart failure), NYHA class IV (HCC)     diastolic  . UTI (lower urinary tract infection)   . Cancer ([place])   . Ovarian cancer ([place])   . DNR (do not resuscitate) 01/24/2016    09/14/2014    has GERD; CONSTIPATION, CHRONIC; HEMATOCHEZIA; OSTEOARTHRITIS, LOWER LEG; KNEE PAIN; BURSITIS, KNEE; Dysphagia; Malignant pleural effusion; Edema of both legs; Heart failure, diastolic, with acute decompensation ([place]); Hyperkalemia; Anemia, unspecified; Rectal bleed; Chest pain; Protein-calorie malnutrition, severe ([place]); Dysphagia, pharyngoesophageal phase; Constipation; Acute on chronic congestive heart failure ([place]); Acute on chronic respiratory failure with hypoxia ([place]); Primary peritoneal carcinomatosis ([place]); Anemia due to chemotherapy; Cellulitis; Genetic testing; and DNR (do not resuscitate) on her problem list.     is allergic to penicillins.  Current Outpatient Prescriptions on File Prior to Visit  Medication Sig Dispense Refill  . Bevacizumab (AVASTIN IV) Inject into the vein. To be given Day 1 every 21 days    . CARBOPLATIN IV Inject into the vein. Days 1, 8, 15 every 21 days    . furosemide (LASIX) 20 MG tablet Take 20 mg by mouth every morning.     . gabapentin (NEURONTIN) 300 MG capsule Take 2 capsules (600 mg total) by mouth at bedtime. 60 capsule 2  . KLOR-CON M10 10 MEQ tablet Take 20 mEq by mouth 3 (three) times daily.     Marland Kitchen lactulose (CHRONULAC) 10 GM/15ML solution Take 30 mLs (20 g total) by mouth 2 (two) times  daily as needed for moderate constipation. 240 mL 0  . lidocaine-prilocaine (EMLA) cream Apply a quarter size amount to port site 1 hour prior to chemo. Do not rub in. Cover with plastic wrap. 30 g 3  . magic mouthwash w/lidocaine SOLN Take 5 mLs by mouth 4 (four) times daily -  before meals and at bedtime. 360 mL 0  . metoCLOPramide (REGLAN) 5 MG  tablet Take 5 mg by mouth 4 (four) times daily.    . mometasone (ELOCON) 0.1 % cream Apply 1 application topically daily. To affected areas (arms and hands)    . ondansetron (ZOFRAN ODT) 4 MG disintegrating tablet 81m ODT q4 hours prn nausea/vomit 12 tablet 0  . ondansetron (ZOFRAN) 8 MG tablet Take 1 tablet (8 mg total) by mouth every 8 (eight) hours as needed for nausea or vomiting. 30 tablet 2  . oxyCODONE (ROXICODONE) 5 MG immediate release tablet Take two tablet by mouth every 4 hours as needed for pain (Patient taking differently: Take 10 mg by mouth every 4 (four) hours as needed for moderate pain or severe pain. ) 90 tablet 0  . oxyCODONE-acetaminophen (PERCOCET/ROXICET) 5-325 MG tablet Take 1 tablet by mouth every 6 (six) hours as needed. 20 tablet 0  . PACLitaxel Protein-Bound Part (ABRAXANE IV) Inject into the vein. Days 1, 8, 15 every 21 days    . pantoprazole (PROTONIX) 40 MG tablet Take 1 tablet (40 mg total) by mouth daily. 30 tablet 0  . PARoxetine (PAXIL) 20 MG tablet Take 1 tablet (20 mg total) by mouth daily. 30 tablet 1  . predniSONE (DELTASONE) 10 MG tablet Take 1 tablet (10 mg total) by mouth daily with breakfast. 30 tablet 1  . prochlorperazine (COMPAZINE) 10 MG tablet Take 1 tablet (10 mg total) by mouth every 6 (six) hours as needed for nausea or vomiting. 30 tablet 2   No current facility-administered medications on file prior to visit.    Past Surgical History  Procedure Laterality Date  . Cholecystectomy    . Cardiac catheterization    . Total abdominal hysterectomy  40 years ago    patient does not know if ovaries were removed  . Appendectomy    . Total knee arthroplasty Left   . Chest tube insertion Left 08/04/2014    Procedure: INSERTION PLEURAL DRAINAGE CATHETER;  Surgeon: PIvin Poot MD;  [facility]  Service: Thoracic;  Laterality: Left;  . Chest tube insertion Right 09/13/2014    Procedure: INSERTION PLEURAL DRAINAGE CATHETER;  Surgeon: BGaye Pollack MD;  [facility]  Service: Thoracic;  Laterality: Right;  . Removal of pleural drainage catheter Bilateral 12/06/2014    Procedure: REMOVAL OF PLEURAL DRAINAGE CATHETER;  Surgeon: PIvin Poot MD;  Location: [facility];  Service: Thoracic;  Laterality: Bilateral;    Denies any headaches, dizziness, double vision, fevers, chills, night sweats, nausea, vomiting, diarrhea, constipation, chest pain, heart palpitations, shortness of breath, blood in stool, black tarry stool, urinary pain, urinary burning, urinary frequency, hematuria.   PHYSICAL EXAMINATION  ECOG PERFORMANCE STATUS: 2 - Symptomatic, <50% confined to bed  Filed Vitals:   03/21/16 1106  BP: 126/67  Pulse: 95  Temp: 98.1 F (36.7 C)  Resp: 18    GENERAL:alert, well developed, comfortable, cooperative and accompanied by family and friends, not ill appearing but not smiling. SKIN: skin color, texture, turgor are normal, no rashes or significant lesions HEAD: Normocephalic, No masses, lesions, tenderness or abnormalities EYES: normal, PERRLA, Conjunctiva  are pink and non-injected EARS: External ears normal OROPHARYNX:lips, buccal mucosa, and tongue normal and mucous membranes are moist  NECK: supple, trachea midline LYMPH:  no palpable lymphadenopathy BREAST:not examined LUNGS: clear to auscultation and percussion, with decreased breath sounds bibasilar without wheezes, rales, or rhonchi. HEART: regular rate & rhythm, no murmurs, no gallops, S1 normal and S2 normal ABDOMEN:abdomen soft and normal bowel sounds BACK: Back symmetric, no curvature. EXTREMITIES:less then 2 second capillary refill, no joint deformities, effusion, or inflammation, no skin discoloration, no cyanosis  NEURO: alert & oriented x 3 with fluent speech, no focal motor/sensory deficits    LABORATORY DATA: CBC    Component Value Date/Time   WBC 7.1 03/20/2016 1344   RBC 4.13 03/20/2016 1344   RBC 4.29 07/27/2014 2257   HGB 11.0*  03/20/2016 1344   HCT 35.0* 03/20/2016 1344   PLT 225 03/20/2016 1344   MCV 84.7 03/20/2016 1344   MCH 26.6 03/20/2016 1344   MCHC 31.4 03/20/2016 1344   RDW 23.6* 03/20/2016 1344   LYMPHSABS 1.7 03/20/2016 1344   MONOABS 0.7 03/20/2016 1344   EOSABS 0.1 03/20/2016 1344   BASOSABS 0.0 03/20/2016 1344      Chemistry      Component Value Date/Time   NA 139 03/20/2016 1344   K 4.0 03/20/2016 1344   CL 101 03/20/2016 1344   CO2 27 03/20/2016 1344   BUN 19 03/20/2016 1344   CREATININE 0.97 03/20/2016 1344      Component Value Date/Time   CALCIUM 8.7* 03/20/2016 1344   ALKPHOS 59 03/20/2016 1344   AST 16 03/20/2016 1344   ALT 13* 03/20/2016 1344   BILITOT 0.3 03/20/2016 1344      Urine dipstick shows positive for RBC's and positive for leukocytes.  Micro exam: negative for WBC's or RBC's and MANY+ bacteria.   PENDING LABS:   RADIOGRAPHIC STUDIES:  Dg Chest 2 View  03/12/2016  CLINICAL DATA:  81 year old female with cough for 1 month. Peritoneal carcinomatosis. Chemotherapy in March. Subsequent encounter. EXAM: CHEST  2 VIEW COMPARISON:  CT chest abdomen and pelvis 02/27/2016. FINDINGS: Right chest porta cath re- demonstrated. There are new small bilateral pleural effusions. Mediastinal contours remain normal. No pneumothorax or pulmonary edema. There is associated platelike opacity at the right lung base. No other confluent pulmonary opacity. Osteopenia. Right upper quadrant surgical clips re - demonstrated. No acute osseous abnormality identified. Calcified aortic atherosclerosis. IMPRESSION: 1. Small bilateral pleural effusions are new since the restaging chest CT on 02/27/2016. 2. Associated patchy right lung base opacity is nonspecific. Electronically Signed   By: Genevie Ann M.D.   On: 03/12/2016 12:48   Ct Chest W Contrast  02/27/2016  CLINICAL DATA:  Peritoneal carcinomatosis, increasing CA 125, status post chemotherapy 1 week ago. Prior appendectomy and hysterectomy. EXAM:  CT CHEST, ABDOMEN, AND PELVIS WITH CONTRAST TECHNIQUE: Multidetector CT imaging of the chest, abdomen and pelvis was performed following the standard protocol during bolus administration of intravenous contrast. CONTRAST:  32m OMNIPAQUE IOHEXOL 300 MG/ML  SOLN COMPARISON:  12/29/2015) FINDINGS: CT CHEST FINDINGS Mediastinum/Nodes: The heart is normal in size. No pericardial effusion. Coronary atherosclerosis of the LAD. Atherosclerotic calcifications of the aortic arch. No suspicious mediastinal lymphadenopathy. Right chest port terminates at the cavoatrial junction. Visualized right thyroid is mildly heterogeneous. Lungs/Pleura: Mild lingular scarring. No suspicious pulmonary nodules. Mild biapical pleural-parenchymal scarring. No focal consolidation. No pleural effusion or pneumothorax. Musculoskeletal: Mild degenerative changes of the thoracic spine. CT ABDOMEN PELVIS FINDINGS Hepatobiliary: Liver  is within normal limits. No suspicious/enhancing hepatic lesions. Status post cholecystectomy. Mild intrahepatic ductal dilatation. Common duct measures 11 mm and smoothly tapers at the ampulla, unchanged. Pancreas: Within normal limits. Spleen: Within normal limits. Adrenals/Urinary Tract: Adrenal glands are within normal limits. 7 mm right lower pole renal cyst (series 3/image 28). Kidneys are otherwise within normal limits. No hydronephrosis. Bladder is within normal limits. Stomach/Bowel: Stomach is notable for a moderate hiatal hernia. No evidence of bowel obstruction. Prior appendectomy. Sigmoid diverticulosis, with mild chronic wall thickening/mucosal hypertrophy (series 2/image 103), but no associated inflammatory changes to suggest acute diverticulitis. Vascular/Lymphatic: Atherosclerotic calcifications of the abdominal aorta and branch vessels. No evidence of abdominal aortic aneurysm. No suspicious abdominopelvic lymphadenopathy. Reproductive: Status post hysterectomy. Bilateral ovaries are not discretely  visualized. Other: Multiple pelvic implants, mildly decreased, including: 5.1 x 4.3 cm right adnexal implant (series 2/ image 102), previously 6.0 x 4.8 cm 3.1 x 4.9 cm left adnexal implant adjacent to the sigmoid colon (series 2/ image 107), previously 7.1 x 4.2 cm 5.0 x 3.8 cm implant in the right pelvic cul-de-sac (series 2/ image 109), previously 5.4 x 3.8 cm Additional cystic lesion along the sigmoid mesocolon is also decreased (series 2/image 108). Musculoskeletal: Mild degenerative changes of the lumbar spine. IMPRESSION: Improving peritoneal carcinomatosis in the pelvis, as above. No evidence of metastatic disease in the chest. Electronically Signed   By: Julian Hy M.D.   On: 02/27/2016 16:53   Ct Abdomen Pelvis W Contrast  02/27/2016  CLINICAL DATA:  Peritoneal carcinomatosis, increasing CA 125, status post chemotherapy 1 week ago. Prior appendectomy and hysterectomy. EXAM: CT CHEST, ABDOMEN, AND PELVIS WITH CONTRAST TECHNIQUE: Multidetector CT imaging of the chest, abdomen and pelvis was performed following the standard protocol during bolus administration of intravenous contrast. CONTRAST:  65m OMNIPAQUE IOHEXOL 300 MG/ML  SOLN COMPARISON:  12/29/2015) FINDINGS: CT CHEST FINDINGS Mediastinum/Nodes: The heart is normal in size. No pericardial effusion. Coronary atherosclerosis of the LAD. Atherosclerotic calcifications of the aortic arch. No suspicious mediastinal lymphadenopathy. Right chest port terminates at the cavoatrial junction. Visualized right thyroid is mildly heterogeneous. Lungs/Pleura: Mild lingular scarring. No suspicious pulmonary nodules. Mild biapical pleural-parenchymal scarring. No focal consolidation. No pleural effusion or pneumothorax. Musculoskeletal: Mild degenerative changes of the thoracic spine. CT ABDOMEN PELVIS FINDINGS Hepatobiliary: Liver is within normal limits. No suspicious/enhancing hepatic lesions. Status post cholecystectomy. Mild intrahepatic ductal  dilatation. Common duct measures 11 mm and smoothly tapers at the ampulla, unchanged. Pancreas: Within normal limits. Spleen: Within normal limits. Adrenals/Urinary Tract: Adrenal glands are within normal limits. 7 mm right lower pole renal cyst (series 3/image 28). Kidneys are otherwise within normal limits. No hydronephrosis. Bladder is within normal limits. Stomach/Bowel: Stomach is notable for a moderate hiatal hernia. No evidence of bowel obstruction. Prior appendectomy. Sigmoid diverticulosis, with mild chronic wall thickening/mucosal hypertrophy (series 2/image 103), but no associated inflammatory changes to suggest acute diverticulitis. Vascular/Lymphatic: Atherosclerotic calcifications of the abdominal aorta and branch vessels. No evidence of abdominal aortic aneurysm. No suspicious abdominopelvic lymphadenopathy. Reproductive: Status post hysterectomy. Bilateral ovaries are not discretely visualized. Other: Multiple pelvic implants, mildly decreased, including: 5.1 x 4.3 cm right adnexal implant (series 2/ image 102), previously 6.0 x 4.8 cm 3.1 x 4.9 cm left adnexal implant adjacent to the sigmoid colon (series 2/ image 107), previously 7.1 x 4.2 cm 5.0 x 3.8 cm implant in the right pelvic cul-de-sac (series 2/ image 109), previously 5.4 x 3.8 cm Additional cystic lesion along the sigmoid mesocolon is also  decreased (series 2/image 108). Musculoskeletal: Mild degenerative changes of the lumbar spine. IMPRESSION: Improving peritoneal carcinomatosis in the pelvis, as above. No evidence of metastatic disease in the chest. Electronically Signed   By: Julian Hy M.D.   On: 02/27/2016 16:53     PATHOLOGY:    ASSESSMENT AND PLAN:  Primary peritoneal carcinomatosis ([place]) Recurrent peritoneal carcinomatosis, STAGE IV, BRCA 2 POSITIVE.  Oncology history updated.  Labs reviewed. CEA 125 is back to baseline (remains elevated). He would need treatment parameters today, but given the patient's  performance status and quality of life, we will hold therapy and discuss future therapeutic options.  I discussed the option of transitioning treatment to maintenance therapy given positive response to therapy thus far on radiographic imaging. Given her quality of life, it is reasonable to move on to maintenance Avastin therapy and monitor for progression of disease moving forward. I discussed the risks, benefits, alternatives, and side effects of this maintenance therapy.  The patient agrees to pursue this option. This will begin next week on 03/28/2016 (her last dose of Avastin with chemotherapy was on 03/07/2016).  Patient reports continued cough that is not productive of yellow/green sputum. She is one more day left of antibiotic. She is utilizing Robitussin-DM over-the-counter for cough suppression. She notes that her cough is keeping her up at night. She will complete her antibiotic course as planned. I have electronically prescribed Tessalon 200 mg 3 times daily as needed for cough.  Patient's caregiver reports dark and malodorous urine. She requests a urine test. Orders place for a urinalysis with reflex. Urinalysis demonstrates positive hemoglobin, leukocytes, and many bacteria. As a result, I will escribed Macrobid for 5 days.  Patient will return next week for Avastin maintenance therapy. Treatment plan is deleted and antibody plan is built reflecting Avastin 15 mg/kg every 3 weeks. Episodes of care are updated as well.  Labs next week: UA dip stick.  Labs every 3 weeks (day 1 of therapy): CBC differential, complete metabolic panel, CEA 957, and urine dipstick.  She will return in 4 weeks for follow-up and cycle #2 of Avastin maintenance.    THERAPY PLAN:  We will transition therapy to maintenance therapy in attempt to improve the patient's quality of life and performance status; yet still maintain progress we've made. We will be on the lookout for progression of disease. We still have  PARP inhibition as a future option for therapy.  All questions were answered. The patient knows to call the clinic with any problems, questions or concerns. We can certainly see the patient much sooner if necessary.  Patient and plan discussed with Dr. Ancil Linsey and she is in agreement with the aforementioned.   This note is electronically signed by: Doy Mince 03/21/2016 3:49 PM

## 2016-03-21 NOTE — Progress Notes (Signed)
Treatment deferred

## 2016-03-28 ENCOUNTER — Encounter (HOSPITAL_BASED_OUTPATIENT_CLINIC_OR_DEPARTMENT_OTHER): Payer: Medicare HMO

## 2016-03-28 VITALS — BP 129/80 | HR 97 | Temp 98.0°F | Resp 18

## 2016-03-28 DIAGNOSIS — C482 Malignant neoplasm of peritoneum, unspecified: Secondary | ICD-10-CM

## 2016-03-28 DIAGNOSIS — Z5112 Encounter for antineoplastic immunotherapy: Secondary | ICD-10-CM | POA: Diagnosis not present

## 2016-03-28 LAB — COMPREHENSIVE METABOLIC PANEL
ALBUMIN: 3.5 g/dL (ref 3.5–5.0)
ALK PHOS: 55 U/L (ref 38–126)
ALT: 10 U/L — ABNORMAL LOW (ref 14–54)
AST: 18 U/L (ref 15–41)
Anion gap: 13 (ref 5–15)
BILIRUBIN TOTAL: 0.4 mg/dL (ref 0.3–1.2)
BUN: 27 mg/dL — AB (ref 6–20)
CALCIUM: 8.8 mg/dL — AB (ref 8.9–10.3)
CO2: 25 mmol/L (ref 22–32)
Chloride: 103 mmol/L (ref 101–111)
Creatinine, Ser: 1.02 mg/dL — ABNORMAL HIGH (ref 0.44–1.00)
GFR calc Af Amer: 59 mL/min — ABNORMAL LOW (ref >60)
GFR calc non Af Amer: 51 mL/min — ABNORMAL LOW (ref >60)
GLUCOSE: 87 mg/dL (ref 65–99)
Potassium: 3.6 mmol/L (ref 3.5–5.1)
Sodium: 141 mmol/L (ref 135–145)
TOTAL PROTEIN: 7.4 g/dL (ref 6.5–8.1)

## 2016-03-28 LAB — URINALYSIS, DIPSTICK ONLY
Bilirubin Urine: NEGATIVE
Glucose, UA: NEGATIVE mg/dL
Ketones, ur: NEGATIVE mg/dL
Nitrite: NEGATIVE
PH: 5.5 (ref 5.0–8.0)
Protein, ur: NEGATIVE mg/dL
SPECIFIC GRAVITY, URINE: 1.01 (ref 1.005–1.030)

## 2016-03-28 LAB — CBC WITH DIFFERENTIAL/PLATELET
Basophils Absolute: 0.1 10*3/uL (ref 0.0–0.1)
Basophils Relative: 1 %
EOS PCT: 2 %
Eosinophils Absolute: 0.2 10*3/uL (ref 0.0–0.7)
HEMATOCRIT: 39.1 % (ref 36.0–46.0)
HEMOGLOBIN: 12.4 g/dL (ref 12.0–15.0)
LYMPHS ABS: 3.6 10*3/uL (ref 0.7–4.0)
Lymphocytes Relative: 31 %
MCH: 27 pg (ref 26.0–34.0)
MCHC: 31.7 g/dL (ref 30.0–36.0)
MCV: 85 fL (ref 78.0–100.0)
MONOS PCT: 9 %
Monocytes Absolute: 1 10*3/uL (ref 0.1–1.0)
NEUTROS PCT: 57 %
Neutro Abs: 6.6 10*3/uL (ref 1.7–7.7)
Platelets: 264 10*3/uL (ref 150–400)
RBC: 4.6 MIL/uL (ref 3.87–5.11)
RDW: 23.2 % — ABNORMAL HIGH (ref 11.5–15.5)
WBC: 11.5 10*3/uL — AB (ref 4.0–10.5)

## 2016-03-28 MED ORDER — BEVACIZUMAB CHEMO INJECTION 400 MG/16ML
15.0000 mg/kg | Freq: Once | INTRAVENOUS | Status: AC
  Administered 2016-03-28: 1200 mg via INTRAVENOUS
  Filled 2016-03-28: qty 48

## 2016-03-28 MED ORDER — SODIUM CHLORIDE 0.9% FLUSH: 10.0000 mL | INTRAVENOUS | Status: DC | PRN

## 2016-03-28 MED ORDER — SODIUM CHLORIDE 0.9 % IV SOLN
Freq: Once | INTRAVENOUS | Status: AC
  Administered 2016-03-28: 13:00:00 via INTRAVENOUS

## 2016-03-28 MED ORDER — HEPARIN SOD (PORK) LOCK FLUSH 100 UNIT/ML IV SOLN
500.0000 [IU] | Freq: Once | INTRAVENOUS | Status: AC | PRN
  Administered 2016-03-28: 500 [IU]
  Filled 2016-03-28: qty 5

## 2016-03-28 NOTE — Progress Notes (Signed)
Tolerated infusion well. 

## 2016-03-28 NOTE — Patient Instructions (Signed)
[facility] Discharge Instructions for Patients Receiving Chemotherapy   Beginning January 23rd 2017 lab work for the Cancer Center will be done in the  Main lab at [facility] on 1st floor. If you have a lab appointment with the Cancer Center please come in thru the  Main Entrance and check in at the main information desk   Today you received the following chemotherapy agents:  Avastin  If you develop nausea and vomiting, or diarrhea that is not controlled by your medication, call the clinic.  The clinic phone number is [phone]. Office hours are Monday-Friday 8:30am-5:00pm.  BELOW ARE SYMPTOMS THAT SHOULD BE REPORTED IMMEDIATELY:  *FEVER GREATER THAN 101.0 F  *CHILLS WITH OR WITHOUT FEVER  NAUSEA AND VOMITING THAT IS NOT CONTROLLED WITH YOUR NAUSEA MEDICATION  *UNUSUAL SHORTNESS OF BREATH  *UNUSUAL BRUISING OR BLEEDING  TENDERNESS IN MOUTH AND THROAT WITH OR WITHOUT PRESENCE OF ULCERS  *URINARY PROBLEMS  *BOWEL PROBLEMS  UNUSUAL RASH Items with * indicate a potential emergency and should be followed up as soon as possible. If you have an emergency after office hours please contact your primary care physician or go to the nearest emergency department.  Please call the clinic during office hours if you have any questions or concerns.   You may also contact the Patient Navigator at [phone] should you have any questions or need assistance in obtaining follow up care.      Resources For Cancer Patients and their Caregivers ? American Cancer Society: Can assist with transportation, wigs, general needs, runs Look Good Feel Better.        1-888-227-6333 ? Cancer Care: Provides financial assistance, online support groups, medication/co-pay assistance.  1-800-813-HOPE (4673) ? [facility] Assists [place] cancer patients and their families through emotional , educational and financial support.   336-427-4357 ? [place] DSS Where to apply for food stamps, Medicaid and utility assistance. 336-342-1394 ? RCATS: Transportation to medical appointments. 336-347-2287 ? Social Security Administration: May apply for disability if have a Stage IV cancer. 336-342-7796 1-800-772-1213 ? [place] Aging, Disability and Transit Services: Assists with nutrition, care and transit needs. 336-349-2343         

## 2016-03-29 LAB — CA 125: CA 125: 155.2 U/mL — ABNORMAL HIGH (ref 0.0–38.1)

## 2016-04-09 ENCOUNTER — Other Ambulatory Visit (HOSPITAL_COMMUNITY): Payer: Self-pay | Admitting: Hematology & Oncology

## 2016-04-18 ENCOUNTER — Encounter (HOSPITAL_COMMUNITY): Payer: Medicare HMO

## 2016-04-18 ENCOUNTER — Inpatient Hospital Stay (HOSPITAL_COMMUNITY): Payer: Medicare HMO

## 2016-04-18 ENCOUNTER — Encounter (HOSPITAL_COMMUNITY): Payer: Self-pay | Admitting: Hematology & Oncology

## 2016-04-18 ENCOUNTER — Encounter (HOSPITAL_COMMUNITY): Payer: Self-pay | Admitting: Lab

## 2016-04-18 ENCOUNTER — Encounter (HOSPITAL_COMMUNITY): Payer: Medicare HMO | Attending: Hematology | Admitting: Hematology & Oncology

## 2016-04-18 VITALS — BP 133/73 | HR 75 | Temp 98.1°F | Resp 20 | Wt 173.3 lb

## 2016-04-18 DIAGNOSIS — C482 Malignant neoplasm of peritoneum, unspecified: Secondary | ICD-10-CM | POA: Insufficient documentation

## 2016-04-18 DIAGNOSIS — M545 Low back pain: Secondary | ICD-10-CM | POA: Diagnosis not present

## 2016-04-18 DIAGNOSIS — R531 Weakness: Secondary | ICD-10-CM

## 2016-04-18 DIAGNOSIS — Z7189 Other specified counseling: Secondary | ICD-10-CM

## 2016-04-18 DIAGNOSIS — C569 Malignant neoplasm of unspecified ovary: Secondary | ICD-10-CM | POA: Insufficient documentation

## 2016-04-18 DIAGNOSIS — N189 Chronic kidney disease, unspecified: Secondary | ICD-10-CM | POA: Diagnosis present

## 2016-04-18 DIAGNOSIS — Z66 Do not resuscitate: Secondary | ICD-10-CM

## 2016-04-18 DIAGNOSIS — D631 Anemia in chronic kidney disease: Secondary | ICD-10-CM | POA: Diagnosis present

## 2016-04-18 LAB — CBC WITH DIFFERENTIAL/PLATELET
BASOS ABS: 0 10*3/uL (ref 0.0–0.1)
Basophils Relative: 0 %
Eosinophils Absolute: 0.2 10*3/uL (ref 0.0–0.7)
Eosinophils Relative: 3 %
HEMATOCRIT: 39.7 % (ref 36.0–46.0)
HEMOGLOBIN: 12.3 g/dL (ref 12.0–15.0)
LYMPHS PCT: 25 %
Lymphs Abs: 2.1 10*3/uL (ref 0.7–4.0)
MCH: 27.3 pg (ref 26.0–34.0)
MCHC: 31 g/dL (ref 30.0–36.0)
MCV: 88 fL (ref 78.0–100.0)
MONO ABS: 0.6 10*3/uL (ref 0.1–1.0)
Monocytes Relative: 7 %
NEUTROS ABS: 5.6 10*3/uL (ref 1.7–7.7)
NEUTROS PCT: 65 %
Platelets: 222 10*3/uL (ref 150–400)
RBC: 4.51 MIL/uL (ref 3.87–5.11)
RDW: 19.2 % — ABNORMAL HIGH (ref 11.5–15.5)
WBC: 8.5 10*3/uL (ref 4.0–10.5)

## 2016-04-18 LAB — COMPREHENSIVE METABOLIC PANEL
ALK PHOS: 56 U/L (ref 38–126)
ALT: 11 U/L — AB (ref 14–54)
AST: 16 U/L (ref 15–41)
Albumin: 3.4 g/dL — ABNORMAL LOW (ref 3.5–5.0)
Anion gap: 8 (ref 5–15)
BILIRUBIN TOTAL: 0.4 mg/dL (ref 0.3–1.2)
BUN: 20 mg/dL (ref 6–20)
CO2: 28 mmol/L (ref 22–32)
Calcium: 8.7 mg/dL — ABNORMAL LOW (ref 8.9–10.3)
Chloride: 103 mmol/L (ref 101–111)
Creatinine, Ser: 1.01 mg/dL — ABNORMAL HIGH (ref 0.44–1.00)
GFR calc Af Amer: 60 mL/min — ABNORMAL LOW (ref >60)
GFR, EST NON AFRICAN AMERICAN: 52 mL/min — AB (ref >60)
GLUCOSE: 138 mg/dL — AB (ref 65–99)
Potassium: 4 mmol/L (ref 3.5–5.1)
Sodium: 139 mmol/L (ref 135–145)
TOTAL PROTEIN: 6.9 g/dL (ref 6.5–8.1)

## 2016-04-18 LAB — URINALYSIS, DIPSTICK ONLY
Bilirubin Urine: NEGATIVE
GLUCOSE, UA: NEGATIVE mg/dL
Ketones, ur: NEGATIVE mg/dL
NITRITE: NEGATIVE
PROTEIN: NEGATIVE mg/dL
Specific Gravity, Urine: 1.01 (ref 1.005–1.030)
pH: 5.5 (ref 5.0–8.0)

## 2016-04-18 MED ORDER — SODIUM CHLORIDE 0.9 % IV SOLN
15.0000 mg/kg | Freq: Once | INTRAVENOUS | Status: DC
  Filled 2016-04-18: qty 48

## 2016-04-18 MED ORDER — SODIUM CHLORIDE 0.9% FLUSH: 10.0000 mL | INTRAVENOUS | Status: DC | PRN

## 2016-04-18 MED ORDER — SODIUM CHLORIDE 0.9 % IV SOLN: Freq: Once | INTRAVENOUS | Status: DC

## 2016-04-18 MED ORDER — HEPARIN SOD (PORK) LOCK FLUSH 100 UNIT/ML IV SOLN: 500.0000 [IU] | Freq: Once | INTRAVENOUS | Status: DC | PRN

## 2016-04-18 NOTE — Progress Notes (Signed)
Not treatment today per MD, see doctors encounter for more information

## 2016-04-18 NOTE — Progress Notes (Signed)
Referral sent to Hospice.  Records faxed on 5/18 

## 2016-04-18 NOTE — Patient Instructions (Addendum)
[place] at [facility] Discharge Instructions  RECOMMENDATIONS MADE BY THE CONSULTANT AND ANY TEST RESULTS WILL BE SENT TO YOUR REFERRING PHYSICIAN.   Referring to Hospice    Thank you for choosing [place] at [facility] to provide your oncology and hematology care.  To afford each patient quality time with our provider, please arrive at least 15 minutes before your scheduled appointment time.   Beginning January 23rd 2017 lab work for the [facility] will be done in the  Main lab at [facility] on 1st floor. If you have a lab appointment with the [facility] please come in thru the  Main Entrance and check in at the main information desk  You need to re-schedule your appointment should you arrive 10 or more minutes late.  We strive to give you quality time with our providers, and arriving late affects you and other patients whose appointments are after yours.  Also, if you no show three or more times for appointments you may be dismissed from the clinic at the providers discretion.     Again, thank you for choosing [facility].  Our hope is that these requests will decrease the amount of time that you wait before being seen by our physicians.       _____________________________________________________________  Should you have questions after your visit to [facility], please contact our office at [phone] between the hours of 8:30 a.m. and 4:30 p.m.  Voicemails left after 4:30 p.m. will not be returned until the following business day.  For prescription refill requests, have your pharmacy contact our office.         Resources For Cancer Patients and their Caregivers ? American Cancer Society: Can assist with transportation, wigs, general needs, runs Look Good Feel Better.        762 757 0049 ? Cancer Care: Provides financial assistance, online support groups, medication/co-pay assistance.   1-800-813-HOPE (762)728-9631) ? [facility] Assists [place] cancer patients and their families through emotional , educational and financial support.  224 484 4165 ? [place] DSS Where to apply for food stamps, Medicaid and utility assistance. (619) 826-1648 ? RCATS: Transportation to medical appointments. 580-554-5100 ? Social Security Administration: May apply for disability if have a Stage IV cancer. 6231933403 6602034485 ? [employer], Disability and Transit Services: Assists with nutrition, care and transit needs. [place] Support Programs: @10RELATIVEDAYS @ > Cancer Support Group  2nd Tuesday of the month 1pm-2pm, Journey Room  > Creative Journey  3rd Tuesday of the month 1130am-1pm, Journey Room  > Look Good Feel Better  1st Wednesday of the month 10am-12 noon, Journey Room (Call [facility] to register [phone])

## 2016-04-18 NOTE — Progress Notes (Signed)
Sarah Bogus, MD [place] [address] [place] [id number]    Primary peritoneal carcinomatosis [facility])   07/25/2014 Pathology Results Left pleural effusion positive for malignant cell consistent with adenocarcinoma.   07/27/2014 - 08/05/2014 Hospital Admission Heart failure with malignant pleural effusion   07/28/2014 Pathology Results Left pleural effusion positive for malignant cells consistent with adenocarcinoma (CK7 pos, CK20 neg, WT-1 pos, ER weakly pos, PR neg, TTF-1 neg, GCDFP-15 neg).  Findings favor gyn/ovarian primary.   08/01/2014 Pathology Results [facility]- Postive genomic findings: BRCA2 R2352f*31, STK11 loss, MYC amplification-equivocal, TP53 R273C, MLL2 QA2633.  Targeted treatments include Olaparib (BRCA2) and Everolimus/Temsirolimus (STK11).   08/03/2014 Tumor Marker CA 125 4954   08/04/2014 Procedure Left pleurX catheter by Dr. VLucianne LeiTright   09/08/2014 - 09/14/2014 Hospital Admission Hospitalized with the following main issues: acute on chronic CHF, acute on chronic respiratory failure with hypoxia, and malignant plueral effusion on right    09/13/2014 Procedure Right PleurX catheter by Dr. BCyndia Bent  09/13/2014 Pathology Results Right pleural effusion positive for malignant adenocarcinoma   09/21/2014 Tumor Marker CA 125 [id number]   09/22/2014 - 11/17/2014 Chemotherapy Carboplatin/Paclitaxel days 1, 8, 15 every 28 days.   10/20/2014 Tumor Marker CA 125 335   11/23/2014 - 02/23/2015 Chemotherapy Carboplatin/Abraxane day 1, 8, 15 every 28 days.   06/06/2015 Tumor Marker CA 125: 21.7    08/04/2015 Tumor Marker CA 125: 23.6    08/17/2015 Imaging CT abd/pelvis- Resolution of ascites and pleural effusions since previous study. Near complete resolution of soft tissue stranding and nodularity in the omental and mesenteric fat.  Resolution of left inguinal lymphadenopathy since prior exam.   10/10/2015 Tumor Marker CA 125: 47.4 (H)    11/30/2015 Tumor Marker CA 125: 101.2 (H)   12/25/2015 Tumor Marker CA 125: 118.4 (H)    12/29/2015 Progression    12/29/2015 Imaging CT CAP- progression of peritoneal carcinomatosis, with interval increase in number and size of numerous large lesions predominantly in the low anatomic pelvis   01/11/2016 - 03/21/2016 Chemotherapy Carboplatin/Abraxane/Avastin days 1, 8, 15 every 21 days.    02/01/2016 Imaging MRI L-spine- Leftward disc protrusion at L5-S1 with slight progression of moderate left and mild right foraminal stenosis. Left subarticular narrowing at L5-S1 is not significantly changed. Mild left foraminal narrowing at L4-5 is stable.   02/27/2016 Imaging CT CAP- Improving peritoneal carcinomatosis in the pelvis, as above. No evidence of metastatic disease in the chest.   03/21/2016 Treatment Plan Change QOL decline, requiring change in therapy.    Chemotherapy Avastin 15 mg/kg every 21 days (maintenance), scheduled to start on 4/27     INTERVAL HISTORY: JSYONA WROBLEWSKI89y.o. female returns for follow-up of her primary peritoneal carcinoma. She is on single agent Avastin. She notes that she is "tired."  Ms. BTalagais accompanied by her boyfriend, son, and daughter-in-law. Presents curled up in a treatment bed.  States, "I just don't feel good". Explains her breathing gets worse sometimes. She doesn't feel as good as she did. She is able to eat. Her son reports she goes outside to sit on the swing.   Her daughter-in-law reports there was one day it took a long time to wake her up. She sleeps throughout the day, snoring loudly. However, she does not sleep well at night. Her boyfriend notes some days she hardly has the strength to walk out to the car. Her daughter-in-law says she still has some good days.   Her son  reports the patient has experienced a lot more confusion in the last week. Her boyfriend admits she has little to say most of the time while she used to talk a lot.  States, "I would like to do the best I can". Admits she has not  spoken with her family about end of life decisions. Further states, "I need to think about it" regarding whether to continue with treatment. Admits she is tired. Admits she would prefer to be at home and not come here as often.  While at the [facility] she put a DNR in place. Her son is not sure if this is still valid.   The patient would prefer to hold treatment today and meet with Hospice care nurses in their home instead.    MEDICAL HISTORY: Past Medical History  Diagnosis Date  . GERD (gastroesophageal reflux disease)   . Osteoarthritis   . Peripheral edema   . CHF (congestive heart failure), NYHA class IV (HCC)     diastolic  . UTI (lower urinary tract infection)   . Cancer ([place])   . Ovarian cancer ([place])   . DNR (do not resuscitate) 01/24/2016    09/14/2014    has GERD; CONSTIPATION, CHRONIC; HEMATOCHEZIA; OSTEOARTHRITIS, LOWER LEG; KNEE PAIN; BURSITIS, KNEE; Dysphagia; Malignant pleural effusion; Edema of both legs; Heart failure, diastolic, with acute decompensation ([place]); Hyperkalemia; Anemia, unspecified; Rectal bleed; Chest pain; Protein-calorie malnutrition, severe ([place]); Dysphagia, pharyngoesophageal phase; Constipation; Acute on chronic congestive heart failure ([place]); Acute on chronic respiratory failure with hypoxia ([place]); Primary peritoneal carcinomatosis ([place]); Anemia due to chemotherapy; Cellulitis; Genetic testing; and DNR (do not resuscitate) on her problem list.    is allergic to penicillins.  Ms. Bazar does not currently have medications on file.  SURGICAL HISTORY: Past Surgical History  Procedure Laterality Date  . Cholecystectomy    . Cardiac catheterization    . Total abdominal hysterectomy  40 years ago    patient does not know if ovaries were removed  . Appendectomy    . Total knee arthroplasty Left   . Chest tube insertion Left 08/04/2014    Procedure: INSERTION PLEURAL DRAINAGE CATHETER;  Surgeon: Ivin Poot, MD;  Location: [place];  Service: Thoracic;   Laterality: Left;  . Chest tube insertion Right 09/13/2014    Procedure: INSERTION PLEURAL DRAINAGE CATHETER;  Surgeon: Gaye Pollack, MD;  Location: [facility];  Service: Thoracic;  Laterality: Right;  . Removal of pleural drainage catheter Bilateral 12/06/2014    Procedure: REMOVAL OF PLEURAL DRAINAGE CATHETER;  Surgeon: Ivin Poot, MD;  Location: [facility];  Service: Thoracic;  Laterality: Bilateral;    SOCIAL HISTORY: Social History   Social History  . Marital Status: Widowed    Spouse Name: N/A  . Number of Children: 4  . Years of Education: N/A   Occupational History  . nurses aid    Social History Main Topics  . Smoking status: Never Smoker   . Smokeless tobacco: Not on file  . Alcohol Use: No  . Drug Use: No  . Sexual Activity: Not on file   Other Topics Concern  . Not on file   Social History Narrative   FAMILY HISTORY: Noncontributory  Review of Systems  Constitutional: Positive for malaise/fatigue. Negative for fever, chills, and weight loss.  HENT: Negative for congestion, hearing loss, nosebleeds, sore throat and tinnitus.   Eyes: Negative for blurred vision, double vision, pain and discharge.  Respiratory:  Negative for hemoptysis, shortness of breath and  wheezing.  Positive for difficulty breathing. (chronic) Cough is primarily in the morning and at night.  Cardiovascular: Negative for chest pain, palpitations, claudication, leg swelling and PND.  Gastrointestinal: Negative for heartburn, nausea, vomiting, abdominal pain, diarrhea, constipation, blood in stool and melena.  Genitourinary: Negative for hematuria, urgency, frequency, dysuria. Musculoskeletal: Positive for back pain. Negative for myalgias, joint pain and falls.  Skin: Negative for itching and rash.  Neurological: Negative for dizziness, tingling, tremors, speech change, focal weakness, sensory changes, seizures, loss of consciousness, weakness and headaches.  Endo/Heme/Allergies: Does not  bruise/bleed easily.  Psychiatric/Behavioral: Positive for confusion. Negative for depression, suicidal ideas, memory loss and substance abuse. The patient is not nervous/anxious and does not have insomnia.   14 point review of systems was performed and is negative except as detailed under history of present illness and above   PHYSICAL EXAMINATION ECOG PERFORMANCE STATUS: 2 - Symptomatic, <50% confined to bed  Filed Vitals:   04/18/16 1216  BP: 133/73  Pulse: 75  Temp: 98.1 F (36.7 C)  Resp: 20   Physical Exam  Constitutional: She is oriented to person, place, and time and well-developed, well-nourished, and in no distress.  In treatment bed. Appears fatigued. Somewhat flat affect. Quiet but answers all questions appropriately HENT:  Head: Normocephalic and atraumatic.  Mouth/Throat: No oropharyngeal exudate.  Eyes: Conjunctivae and EOM are normal. Pupils are equal, round, and reactive to light. No scleral icterus.  Neck: Normal range of motion. Neck supple. No JVD present. No thyromegaly present.  Cardiovascular: Normal rate and regular rhythm.  Exam reveals no friction rub.   No murmur heard. Pulmonary/Chest: Effort normal and breath sounds normal. No respiratory distress. She has no wheezes. She has no rales.  Abdominal: Soft. Bowel sounds are normal. She exhibits no distension. There is no rebound and no guarding. No palpable mass Musculoskeletal: Normal range of motion.  Lymphadenopathy:    She has no cervical adenopathy.  Neurological: She is alert and oriented to person, place, and time. No cranial nerve deficit.  Skin: Skin is warm and dry.  Psychiatric: Mood, memory, affect and judgment normal. somewhat flat affect  LABORATORY DATA: I have reviewed the data as listed. CBC    Component Value Date/Time   WBC 8.5 04/18/2016 1104   RBC 4.51 04/18/2016 1104   RBC 4.29 07/27/2014 2257   HGB 12.3 04/18/2016 1104   HCT 39.7 04/18/2016 1104   PLT 222 04/18/2016 1104    MCV 88.0 04/18/2016 1104   MCH 27.3 04/18/2016 1104   MCHC 31.0 04/18/2016 1104   RDW 19.2* 04/18/2016 1104   LYMPHSABS 2.1 04/18/2016 1104   MONOABS 0.6 04/18/2016 1104   EOSABS 0.2 04/18/2016 1104   BASOSABS 0.0 04/18/2016 1104   CMP     Component Value Date/Time   NA 139 04/18/2016 1104   K 4.0 04/18/2016 1104   CL 103 04/18/2016 1104   CO2 28 04/18/2016 1104   GLUCOSE 138* 04/18/2016 1104   BUN 20 04/18/2016 1104   CREATININE 1.01* 04/18/2016 1104   CALCIUM 8.7* 04/18/2016 1104   PROT 6.9 04/18/2016 1104   ALBUMIN 3.4* 04/18/2016 1104   AST 16 04/18/2016 1104   ALT 11* 04/18/2016 1104   ALKPHOS 56 04/18/2016 1104   BILITOT 0.4 04/18/2016 1104   GFRNONAA 52* 04/18/2016 1104   GFRAA 60* 04/18/2016 1104     RADIOLOGY: I have personally reviewed the radiological images as listed and agreed with the findings in the report. Study Result  CLINICAL DATA: Peritoneal carcinomatosis, increasing CA 125, status post chemotherapy 1 week ago. Prior appendectomy and hysterectomy.  EXAM: CT CHEST, ABDOMEN, AND PELVIS WITH CONTRAST  TECHNIQUE: Multidetector CT imaging of the chest, abdomen and pelvis was performed following the standard protocol during bolus administration of intravenous contrast.  CONTRAST: 19m OMNIPAQUE IOHEXOL 300 MG/ML SOLN  COMPARISON: 12/29/2015)  FINDINGS: CT CHEST FINDINGS  Mediastinum/Nodes: The heart is normal in size. No pericardial effusion.  Coronary atherosclerosis of the LAD.  Atherosclerotic calcifications of the aortic arch.  No suspicious mediastinal lymphadenopathy.  Right chest port terminates at the cavoatrial junction.  Visualized right thyroid is mildly heterogeneous.  Lungs/Pleura: Mild lingular scarring.  No suspicious pulmonary nodules.  Mild biapical pleural-parenchymal scarring.  No focal consolidation.  No pleural effusion or pneumothorax.  Musculoskeletal: Mild degenerative changes  of the thoracic spine.  CT ABDOMEN PELVIS FINDINGS  Hepatobiliary: Liver is within normal limits. No suspicious/enhancing hepatic lesions.  Status post cholecystectomy. Mild intrahepatic ductal dilatation. Common duct measures 11 mm and smoothly tapers at the ampulla, unchanged.  Pancreas: Within normal limits.  Spleen: Within normal limits.  Adrenals/Urinary Tract: Adrenal glands are within normal limits.  7 mm right lower pole renal cyst (series 3/image 28). Kidneys are otherwise within normal limits. No hydronephrosis.  Bladder is within normal limits.  Stomach/Bowel: Stomach is notable for a moderate hiatal hernia.  No evidence of bowel obstruction.  Prior appendectomy.  Sigmoid diverticulosis, with mild chronic wall thickening/mucosal hypertrophy (series 2/image 103), but no associated inflammatory changes to suggest acute diverticulitis.  Vascular/Lymphatic: Atherosclerotic calcifications of the abdominal aorta and branch vessels. No evidence of abdominal aortic aneurysm.  No suspicious abdominopelvic lymphadenopathy.  Reproductive: Status post hysterectomy.  Bilateral ovaries are not discretely visualized.  Other: Multiple pelvic implants, mildly decreased, including:  5.1 x 4.3 cm right adnexal implant (series 2/ image 102), previously 6.0 x 4.8 cm  3.1 x 4.9 cm left adnexal implant adjacent to the sigmoid colon (series 2/ image 107), previously 7.1 x 4.2 cm  5.0 x 3.8 cm implant in the right pelvic cul-de-sac (series 2/ image 109), previously 5.4 x 3.8 cm  Additional cystic lesion along the sigmoid mesocolon is also decreased (series 2/image 108).  Musculoskeletal: Mild degenerative changes of the lumbar spine.  IMPRESSION: Improving peritoneal carcinomatosis in the pelvis, as above.  No evidence of metastatic disease in the chest.   Electronically Signed  By: SJulian HyM.D.  On: 02/27/2016 16:53      ASSESSMENT and THERAPY PLAN:  Stage IV primary peritoneal carcinoma Chronic L side, Left lower back pain Recurrent peritoneal carcinoma Declining PS Weakness  We discussed multiple issues today including PARP inhibitors. She notes she just does not think she wants to do any more therapy. Son notes he will support her decisions although it is hard.  The patient would prefer to hold treatment today and meet with Hospice  in their home instead. I will arrange for a consultation with Hospice in their home.   A DNR form was filed today. Patient and family were advised to call with questions. I will not schedule for follow-up at this time as plans at this point are for hospice admission. Given her declining PS and wishes this is certainly more than appropriate.   All questions were answered. The patient knows to call the clinic with any problems, questions or concerns. We can certainly see the patient much sooner if necessary.   This note was signed electronically.  This document serves as  a record of services personally performed by Ancil Linsey, MD. It was created on her behalf by Arlyce Harman, a trained medical scribe. The creation of this record is based on the scribe's personal observations and the provider's statements to them. This document has been checked and approved by the attending provider.  I have reviewed the above documentation for accuracy and completeness and I agree with the above.  Kelby Fam. Whitney Muse, MD

## 2016-04-19 ENCOUNTER — Encounter (HOSPITAL_COMMUNITY): Payer: Self-pay | Admitting: Hematology & Oncology

## 2016-04-19 LAB — CA 125: CA 125: 71.6 U/mL — ABNORMAL HIGH (ref 0.0–38.1)

## 2016-04-25 ENCOUNTER — Other Ambulatory Visit (HOSPITAL_COMMUNITY): Payer: Self-pay | Admitting: Hematology & Oncology

## 2016-05-09 ENCOUNTER — Inpatient Hospital Stay (HOSPITAL_COMMUNITY): Payer: Medicare HMO

## 2016-07-02 NOTE — Progress Notes (Signed)
This encounter was created in error - please disregard.

## 2016-07-02 DEATH — deceased

## 2016-07-11 ENCOUNTER — Other Ambulatory Visit: Payer: Self-pay | Admitting: Nurse Practitioner

## 2017-01-15 ENCOUNTER — Encounter (HOSPITAL_COMMUNITY): Payer: Self-pay

## 2017-01-16 ENCOUNTER — Encounter (HOSPITAL_COMMUNITY): Payer: Self-pay

## 2017-05-18 IMAGING — MR MR LUMBAR SPINE W/O CM
4 of 5 series · 15 of 48 positions shown · non-contrast
Comparison: MRI of the lumbar spine 06/20/2015. CT abdomen and
pelvis 12/29/2015.

CLINICAL DATA: Low back pain over the last year. Personal history
of ovarian cancer.

EXAM:
MRI LUMBAR SPINE WITHOUT CONTRAST
TECHNIQUE: Multiplanar, multisequence MR imaging of the lumbar spine was
performed. No intravenous contrast was administered.

[Series 3: T2 · sagittal · 4.0mm · 0.67mm/px · 6 of 15 slices shown (1 of 2)]
[im 1/15]
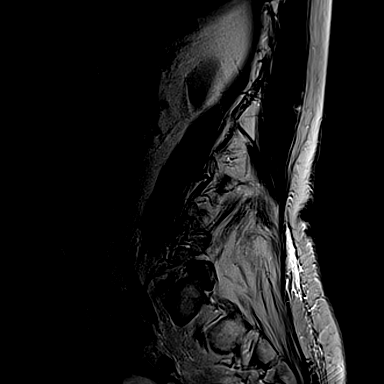
[im 3/15]
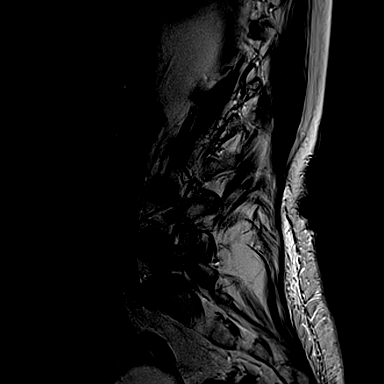
[im 6/15]
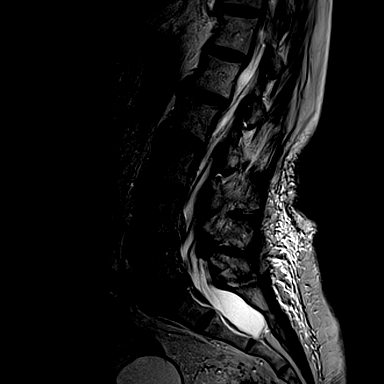
[im 9/15]
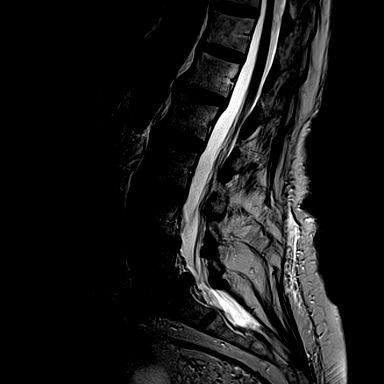
[im 12/15]
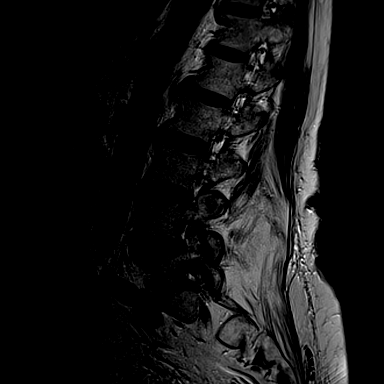
[im 15/15]
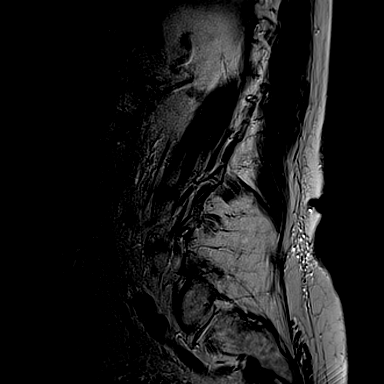

[Series 4: T1 · sagittal · 4.0mm · 0.37mm/px · 3 of 15 slices shown (1 of 2)]
[im 3/15]
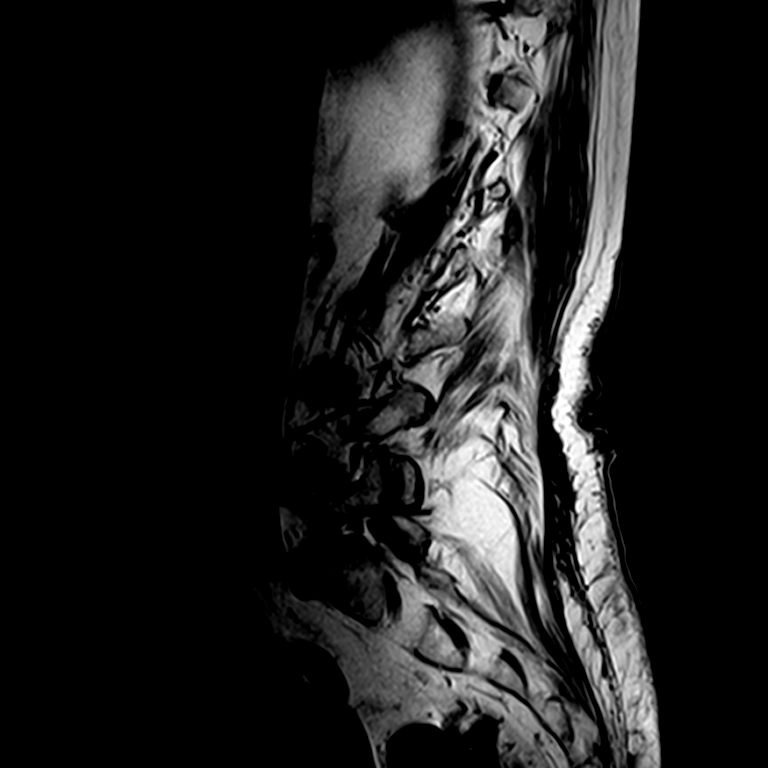
[im 9/15]
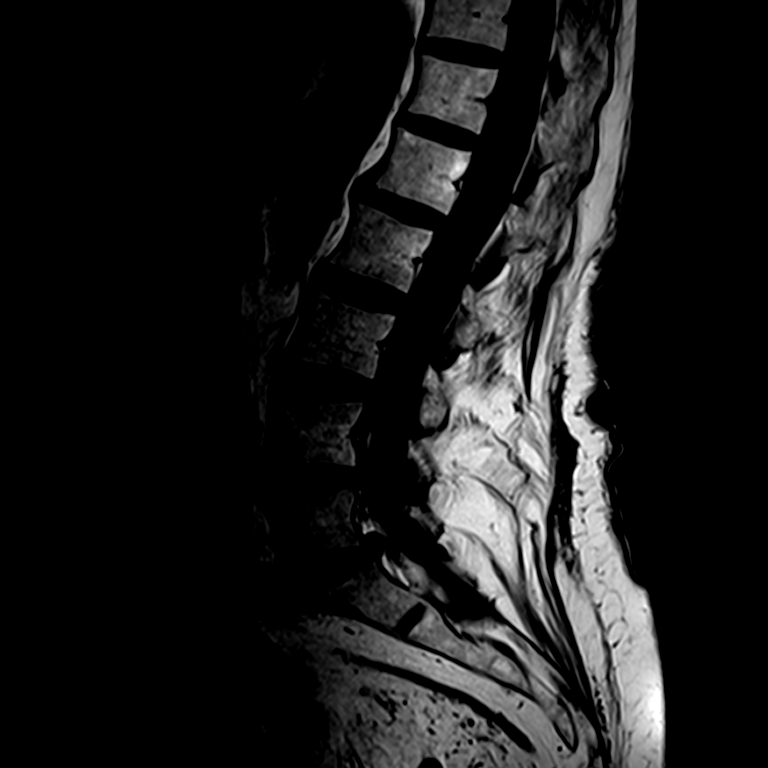
[im 15/15]
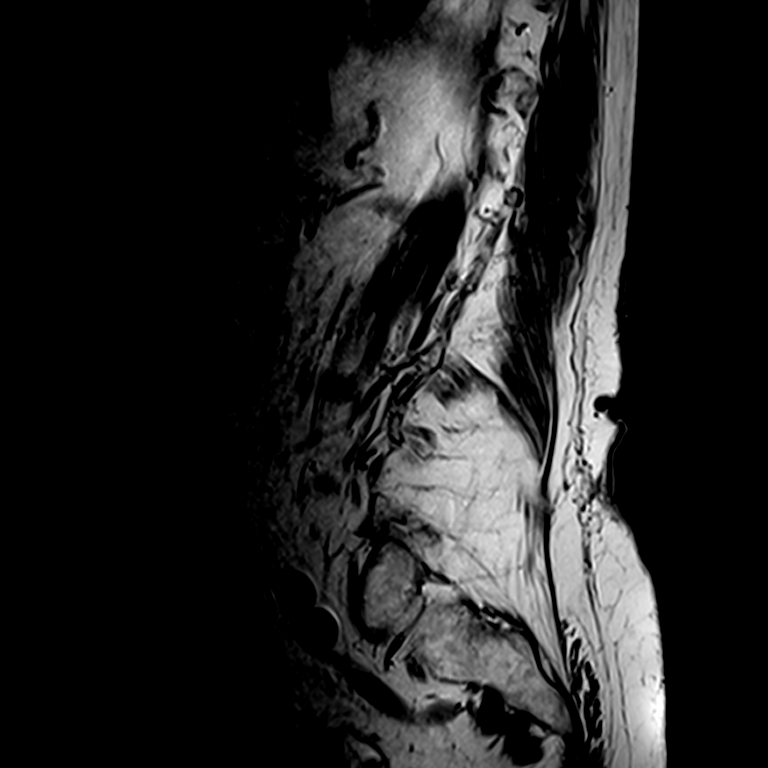

[Series 6: T2 · axial · 4.0mm · 0.24mm/px · z∈[-80,+62]mm · 3 of 38 slices shown (2 of 2)]
[im 6/38]
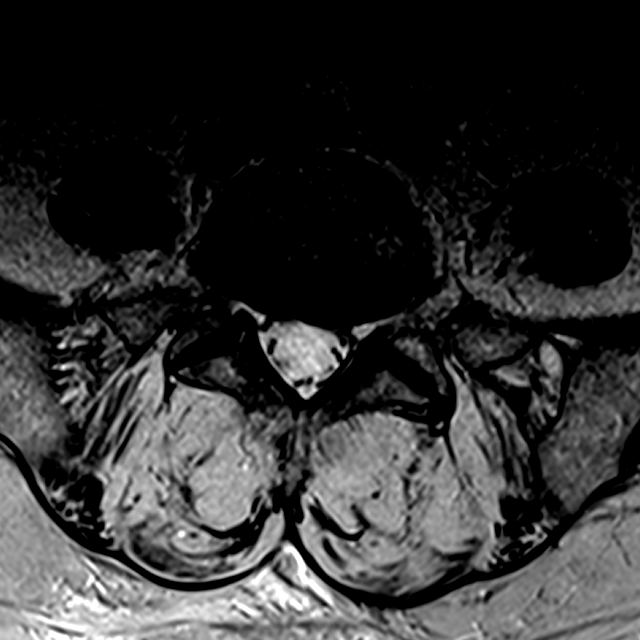
[im 19/38]
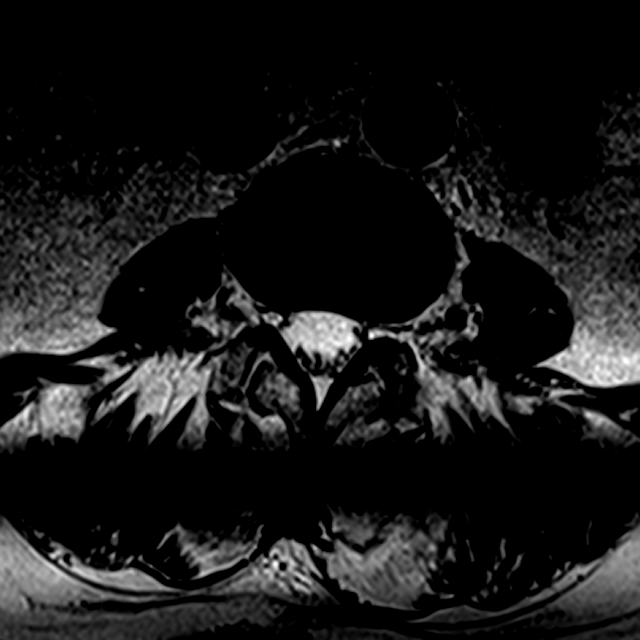
[im 32/38]
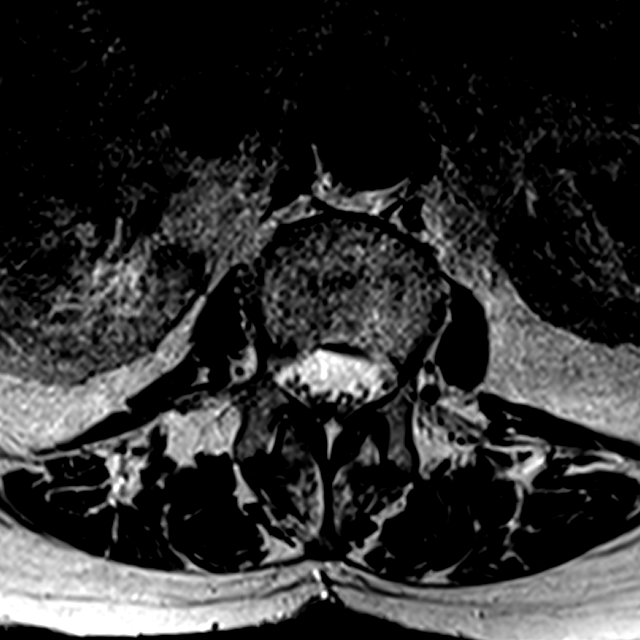

[Series 9: T1 · axial · 4.0mm · 0.25mm/px · z∈[-84,+59]mm · 3 of 38 slices shown (2 of 2)]
[im 6/38]
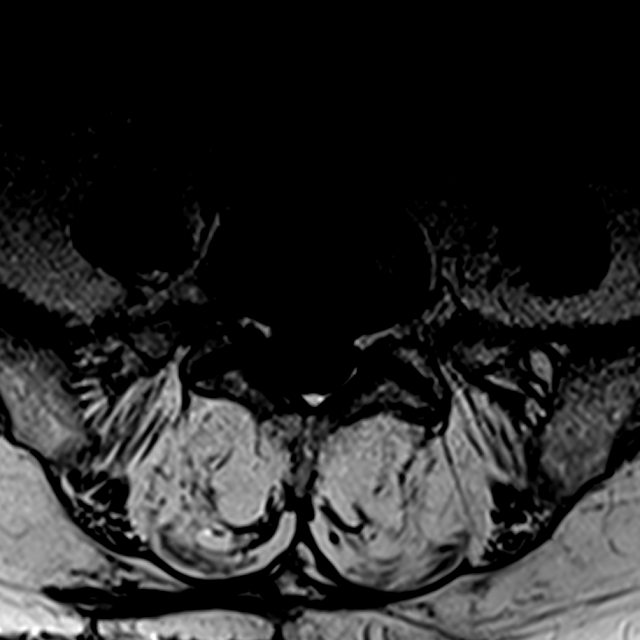
[im 19/38]
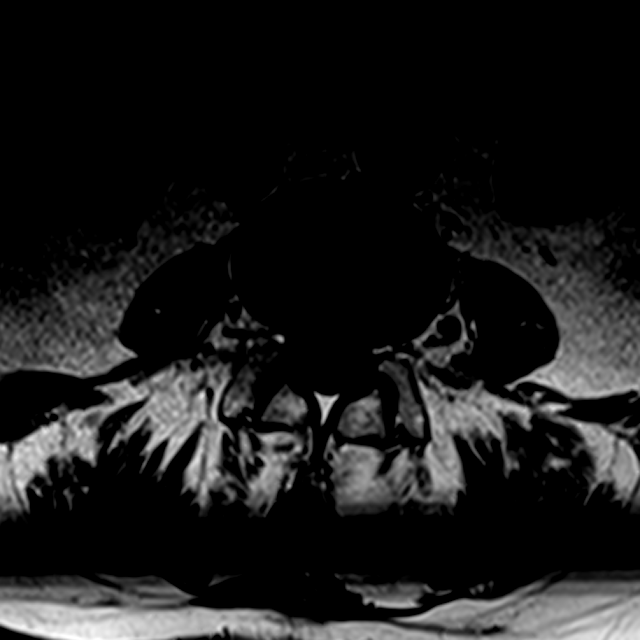
[im 32/38]
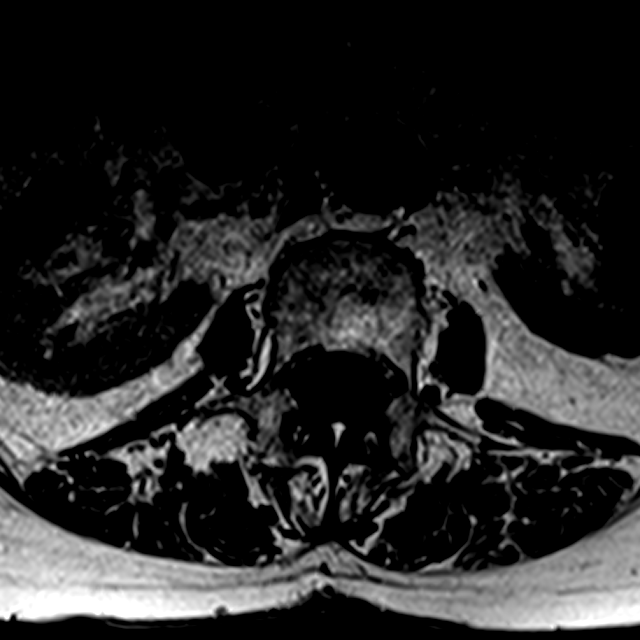

[15 of 48 positions shown; findings below may reference images not displayed]

FINDINGS: Normal signal is present in the distal thoracic spinal cord and
conus medullaris which terminates at L2, within normal limits.
Marrow signal, vertebral body heights, and alignment are normal.

Limited imaging of the abdomen demonstrates a cystic lesion within
the anatomic pelvis measuring at least 4.7 cm. This is compatible
with patient's known peritoneal carcinomatosis. Multiple cystic
lesions were evident on the recent CT scan.

L1-2: Mild facet hypertrophy is present bilaterally without
significant disc protrusion or stenosis.

L2-3:  Negative.

L3-4: Mild facet hypertrophy and disc bulging is present without
significant focal stenosis or change.

L4-5: A broad-based disc protrusion is asymmetric the left. Mild
facet hypertrophy is noted bilaterally. This results in mild left
foraminal narrowing.

L5-S1: A leftward disc protrusion is similar to the prior exam.
There is slight progression of moderate left and mild right
foraminal stenosis. Mild left subarticular narrowing is stable.
IMPRESSION: 1. Leftward disc protrusion at L5-S1 with slight progression of
moderate left and mild right foraminal stenosis.
2. Left subarticular narrowing at L5-S1 is not significantly
changed.
3. Mild left foraminal narrowing at L4-5 is stable.
4. Mild facet hypertrophy at L1-2 and L3-4 without significant
stenosis at these levels.
5. A cystic lesion within the anatomic pelvis is partially imaged.
This corresponds to the areas of peritoneal carcinomatosis which
were demonstrated on the recent CT scan.
